# Patient Record
Sex: Male | Born: 1965 | Race: White | Hispanic: No | Marital: Married | Smoking: Current every day smoker
Health system: Southern US, Community
[De-identification: ages and names within clinical notes are randomized; demographics above are authoritative.]

## PROBLEM LIST (undated history)

## (undated) DIAGNOSIS — I639 Cerebral infarction, unspecified: Secondary | ICD-10-CM

## (undated) DIAGNOSIS — J439 Emphysema, unspecified: Secondary | ICD-10-CM

## (undated) DIAGNOSIS — E119 Type 2 diabetes mellitus without complications: Secondary | ICD-10-CM

## (undated) DIAGNOSIS — I10 Essential (primary) hypertension: Secondary | ICD-10-CM

## (undated) HISTORY — PX: KNEE SURGERY: SHX244

## (undated) HISTORY — DX: Type 2 diabetes mellitus without complications: E11.9

## (undated) HISTORY — DX: Essential (primary) hypertension: I10

## (undated) HISTORY — PX: FOOT SURGERY: SHX648

## (undated) HISTORY — DX: Cerebral infarction, unspecified: I63.9

## (undated) HISTORY — PX: SHOULDER SURGERY: SHX246

---

## 2002-11-19 ENCOUNTER — Other Ambulatory Visit: Payer: Self-pay

## 2003-03-06 ENCOUNTER — Other Ambulatory Visit: Payer: Self-pay

## 2003-08-31 ENCOUNTER — Other Ambulatory Visit: Payer: Self-pay

## 2004-06-29 ENCOUNTER — Other Ambulatory Visit: Payer: Self-pay

## 2004-06-29 ENCOUNTER — Emergency Department: Payer: Self-pay | Admitting: Internal Medicine

## 2004-06-29 IMAGING — CR DG CHEST 1V PORT
1 series · 1 of 1 positions shown · non-contrast
Comparison: none

REASON FOR EXAM: cp  sob  [HOSPITAL]
COMMENTS:

[view not recorded]
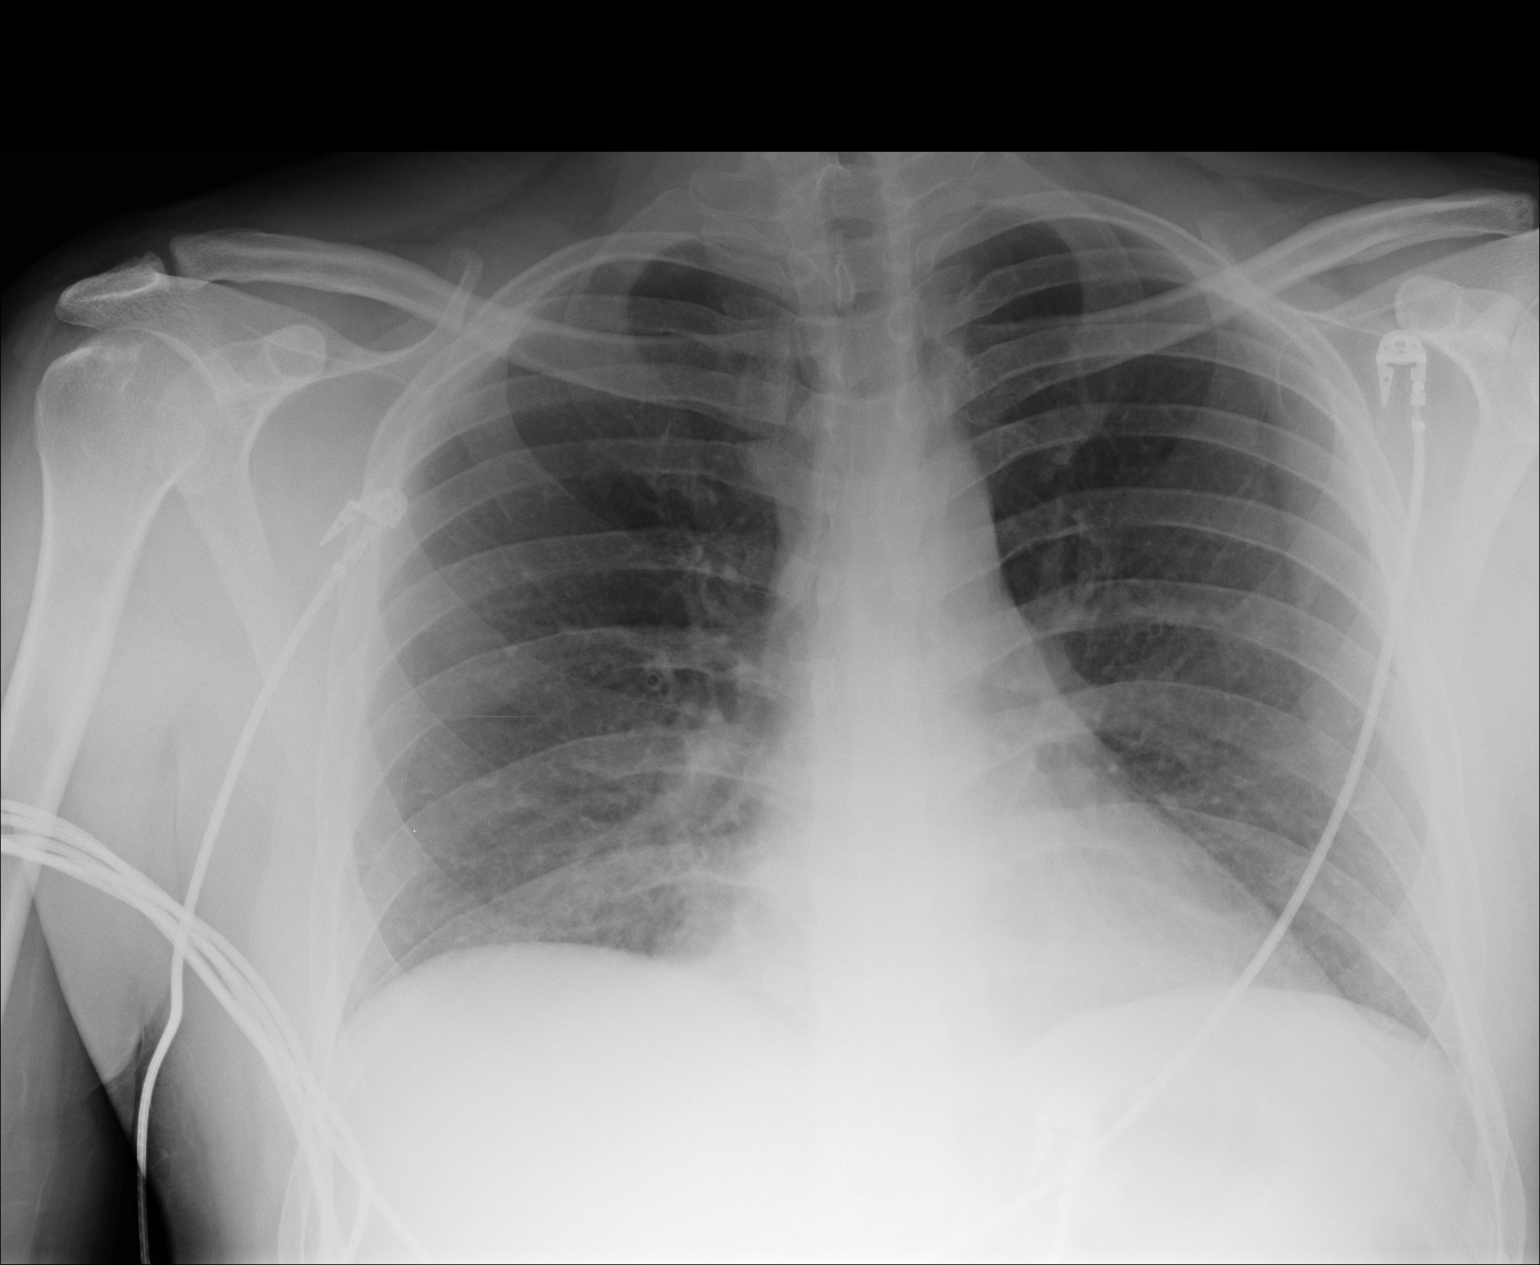

[1 of 1 positions shown; findings below may reference images not displayed]

PROCEDURE:     DXR - DXR PORTABLE CHEST SINGLE VIEW  - [DATE]  [DATE]

RESULT:       A single AP portable exam was obtained and compared to PA and
lateral of [DATE].  The heart is within normal limits in size.  Inspiratory
effort appears incomplete.  However, the lung fields appear clear with some
crowding of the bronchovascular structures in the lung bases.  Vascularity
is within normal limits with no effusions.
IMPRESSION: No acute findings identified.

## 2004-09-01 ENCOUNTER — Emergency Department: Payer: Self-pay | Admitting: Emergency Medicine

## 2005-04-08 ENCOUNTER — Emergency Department: Payer: Self-pay | Admitting: Emergency Medicine

## 2005-04-08 IMAGING — CR DG KNEE COMPLETE 4+V*R*
1 series · 5 of 5 positions shown · non-contrast
Comparison: none

REASON FOR EXAM: Pain
COMMENTS:

[Series 1: view not recorded · 0.17mm/px · 5 of 5 slices shown]
[im 1/5]
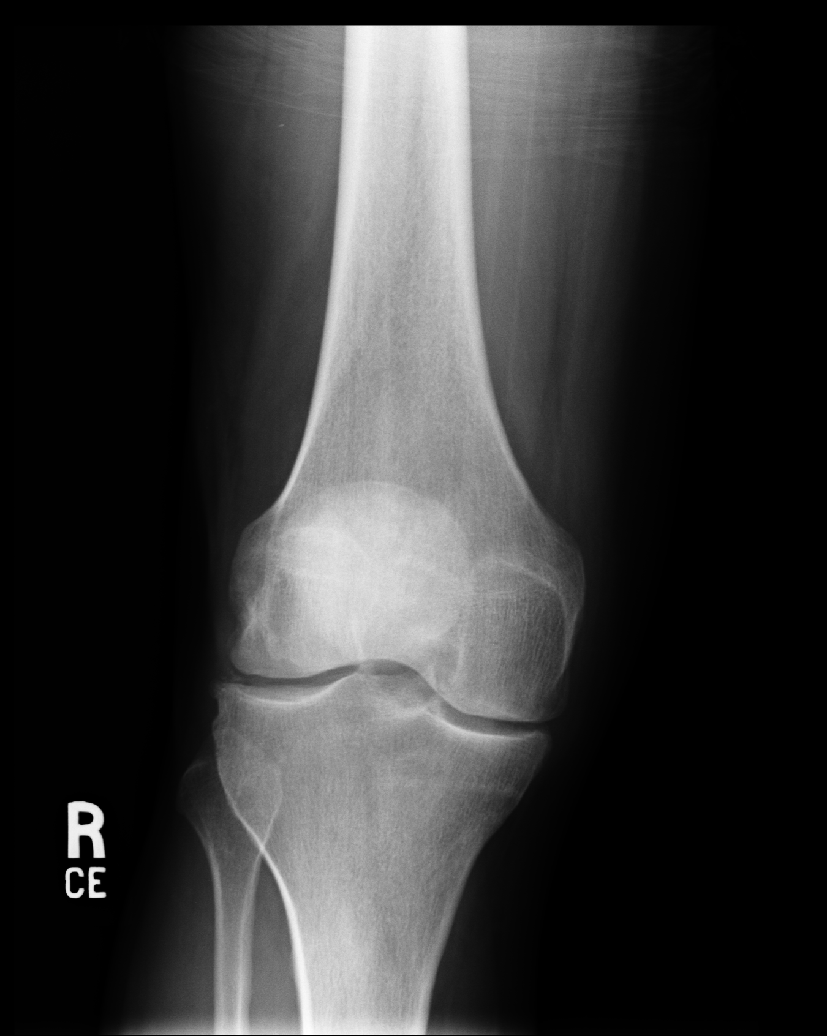
[im 2/5]
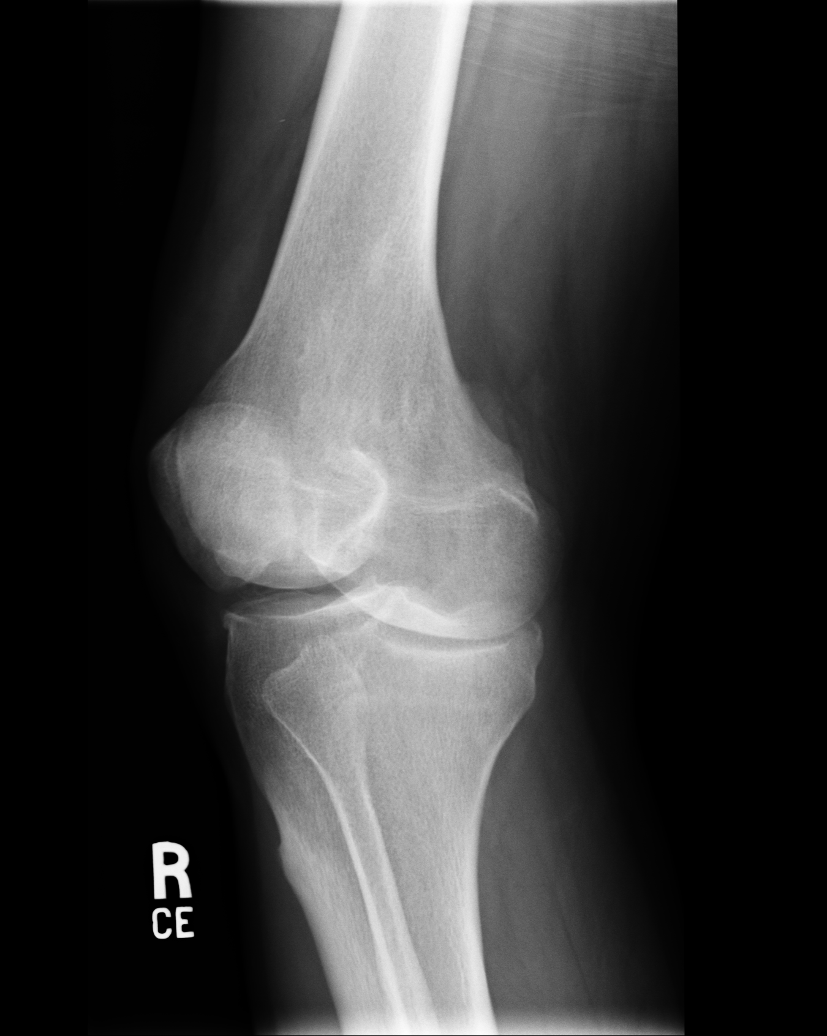
[im 3/5]
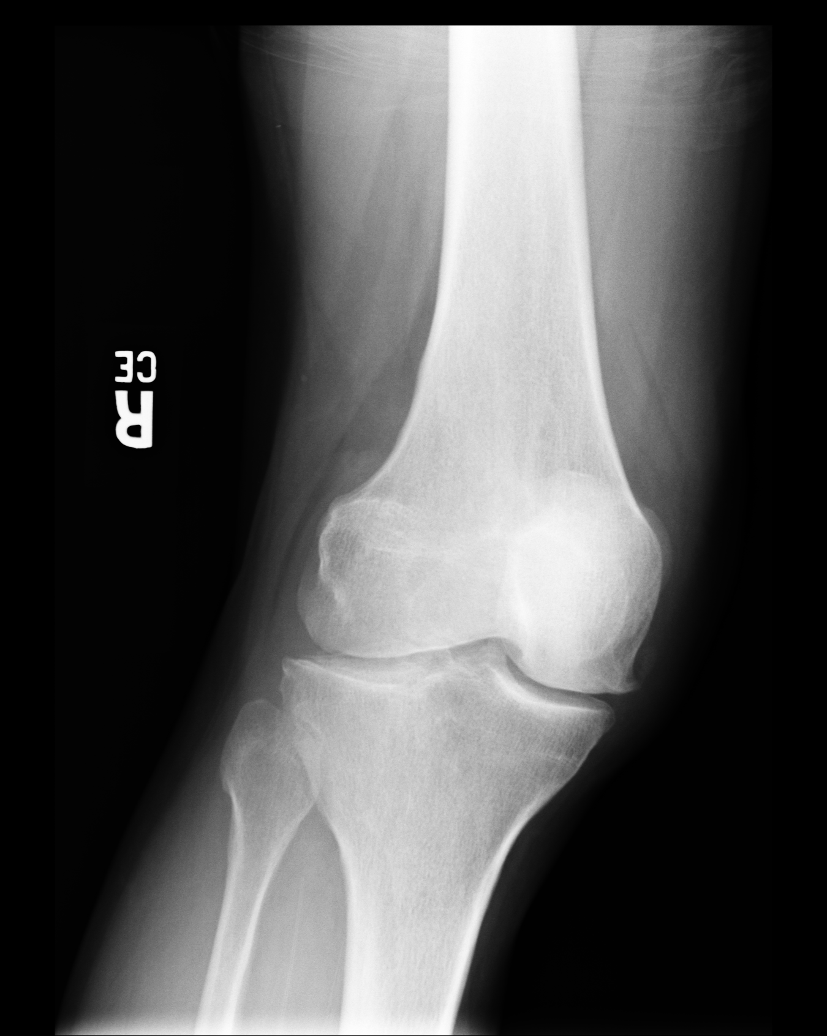
[im 4/5]
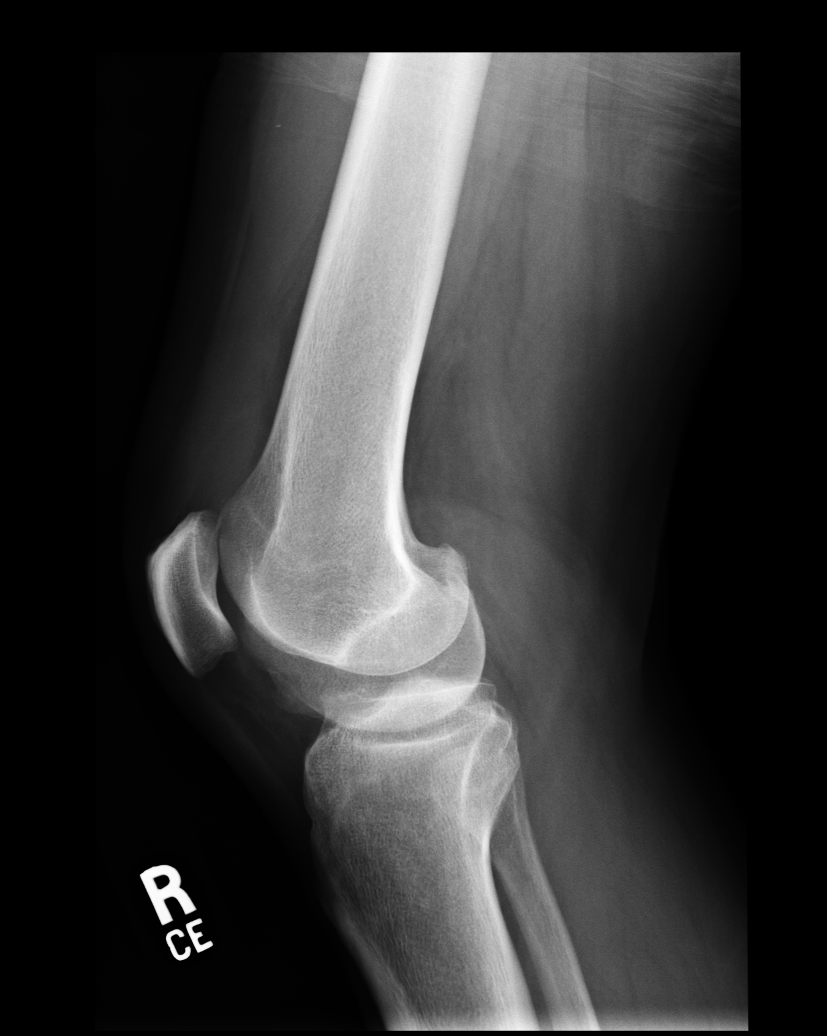
[im 5/5]
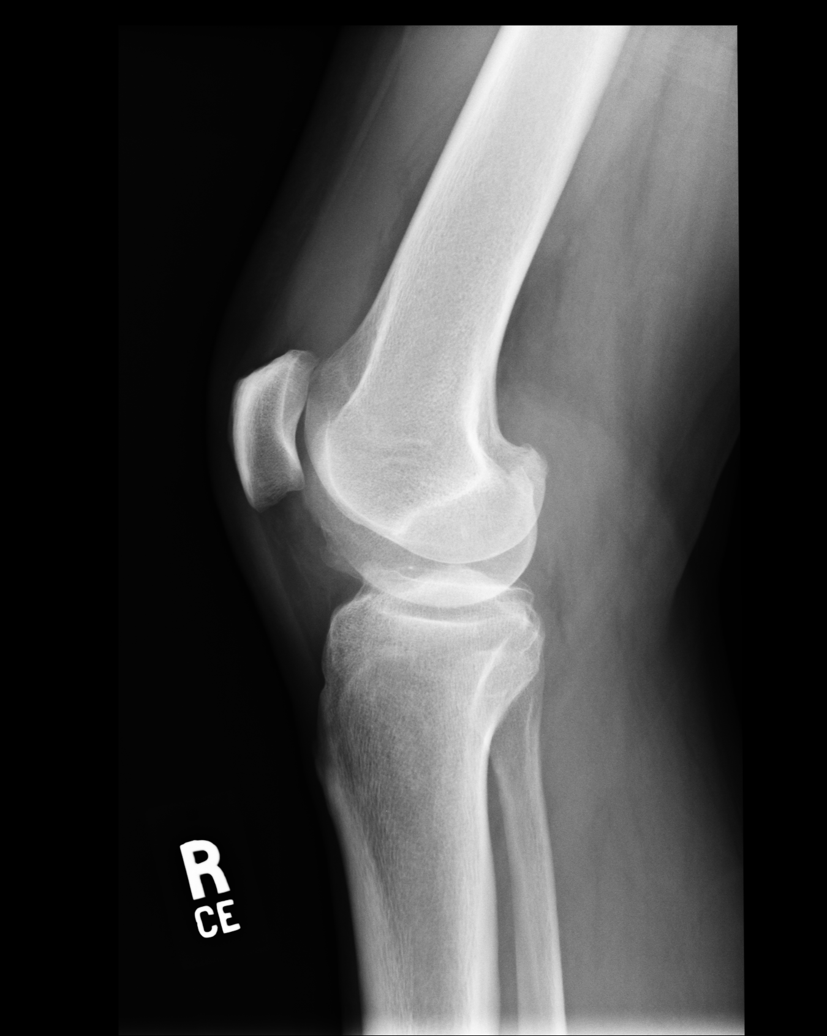

[5 of 5 positions shown; findings below may reference images not displayed]

PROCEDURE:     DXR - DXR KNEE RT COMP WITH OBLIQUES  - [DATE]  [DATE]

RESULT:     The bones of the knee appear adequately mineralized.  I see no
evidence of fracture or dislocation or joint effusion.  No more than mild
degenerative-type change is present and this is manifested by an osteophyte
from the medial femoral condyle.
IMPRESSION: I do not see evidence of an acute fracture or severe degenerative joint
change.  Further evaluation with MRI may be of value if there are clinical
findings worrisome for internal derangement.

## 2005-08-24 ENCOUNTER — Emergency Department: Payer: Self-pay | Admitting: Emergency Medicine

## 2005-09-14 ENCOUNTER — Emergency Department: Payer: Self-pay | Admitting: Emergency Medicine

## 2005-10-06 ENCOUNTER — Emergency Department: Payer: Self-pay | Admitting: Emergency Medicine

## 2005-10-06 ENCOUNTER — Other Ambulatory Visit: Payer: Self-pay

## 2005-10-06 IMAGING — CT CT HEAD WITHOUT CONTRAST
2 series · 16 of 30 positions shown, 20 images · non-contrast
Comparison: none

REASON FOR EXAM: Dizzy
COMMENTS:

[Series 2: without · axial · non-contrast · 0.38mm/px · z∈[+436,+556]mm · 13 of 29 slices shown, 17 images]
[im 3/29  brain]
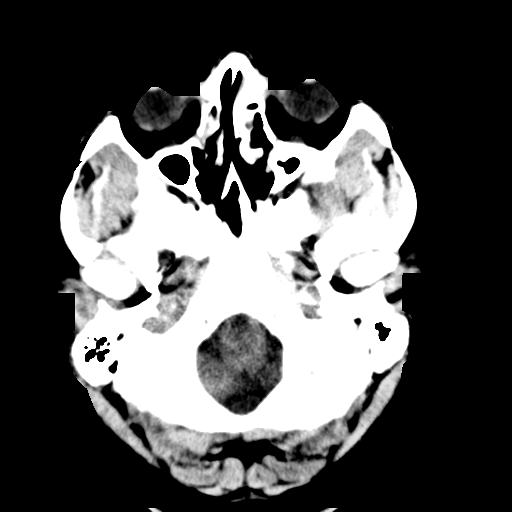
[im 3/29  bone]
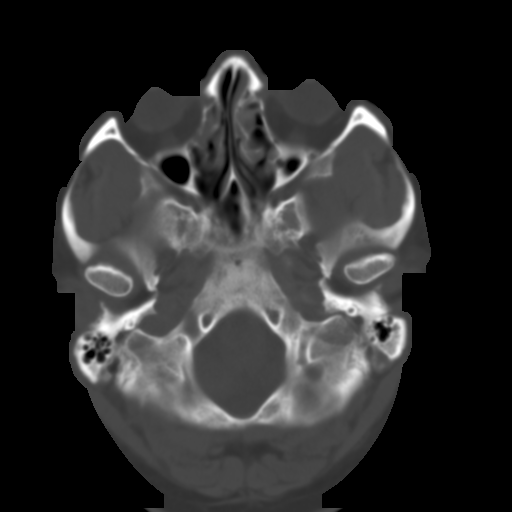
[im 5/29  brain]
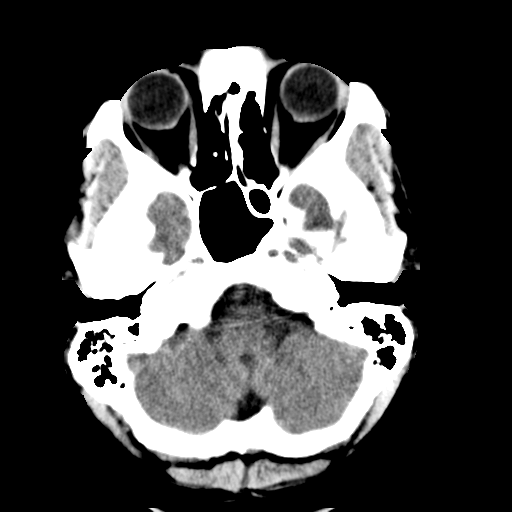
[im 7/29  brain]
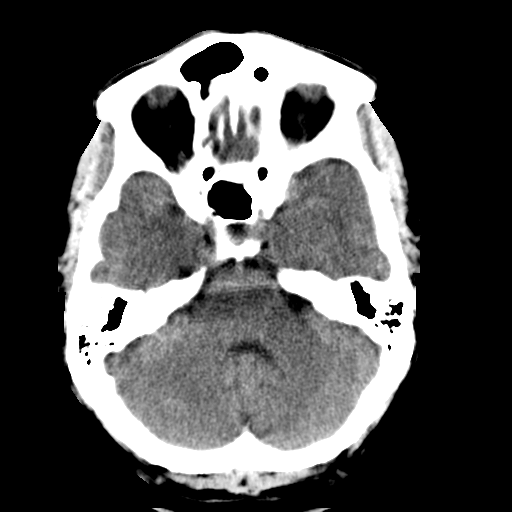
[im 9/29  brain]
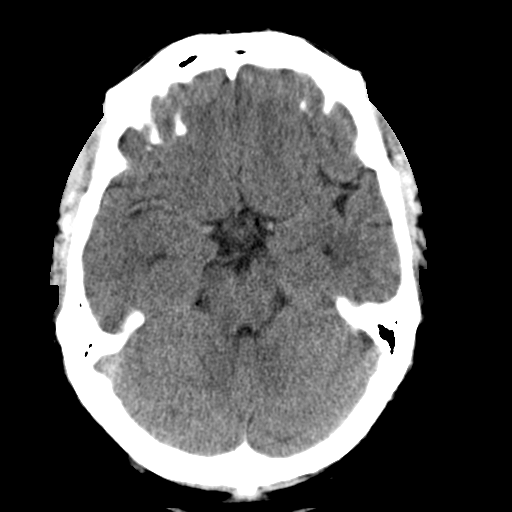
[im 11/29  brain]
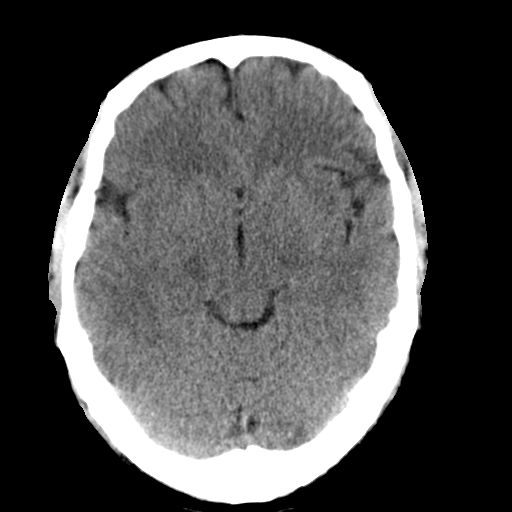
[im 11/29  bone]
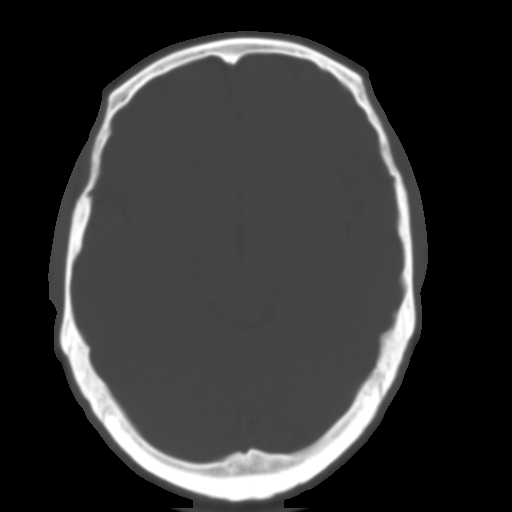
[im 13/29  brain]
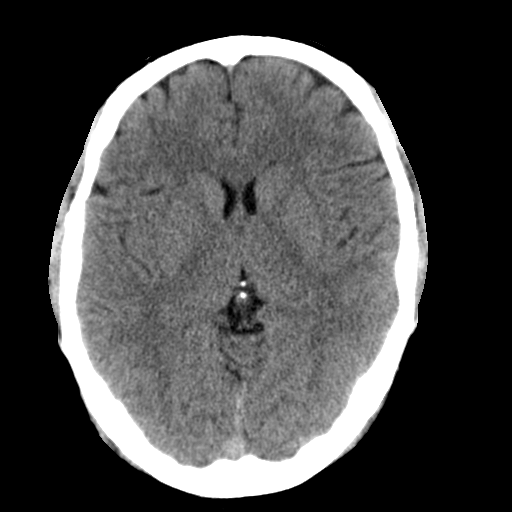
[im 15/29  brain]
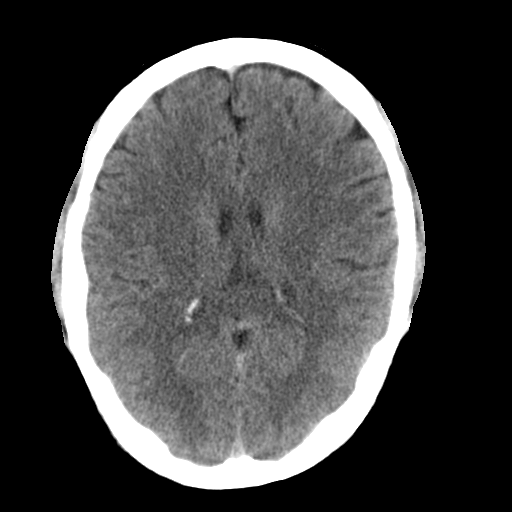
[im 17/29  brain]
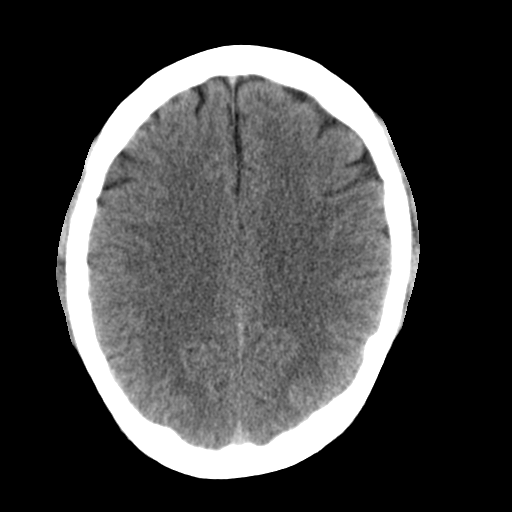
[im 19/29  brain]
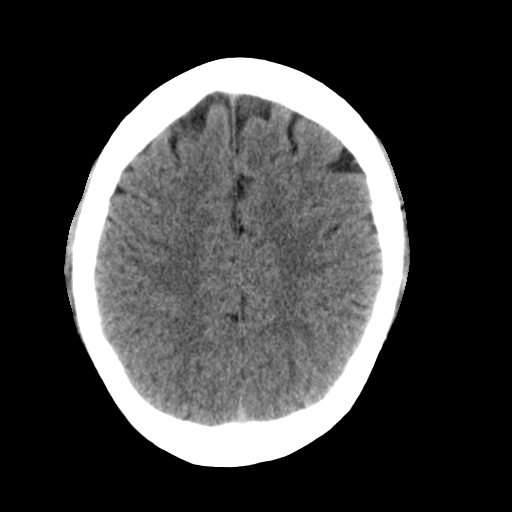
[im 19/29  bone]
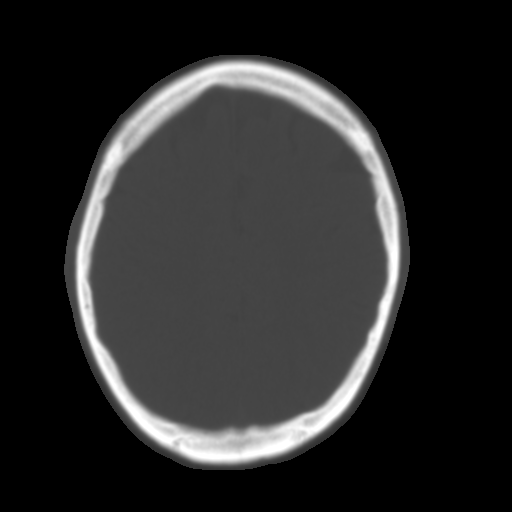
[im 21/29  brain]
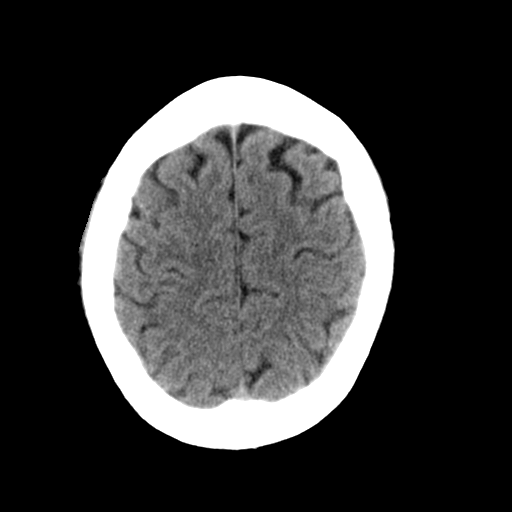
[im 23/29  brain]
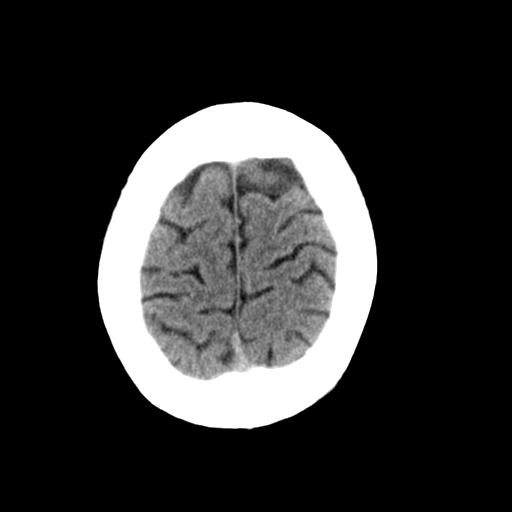
[im 25/29  brain]
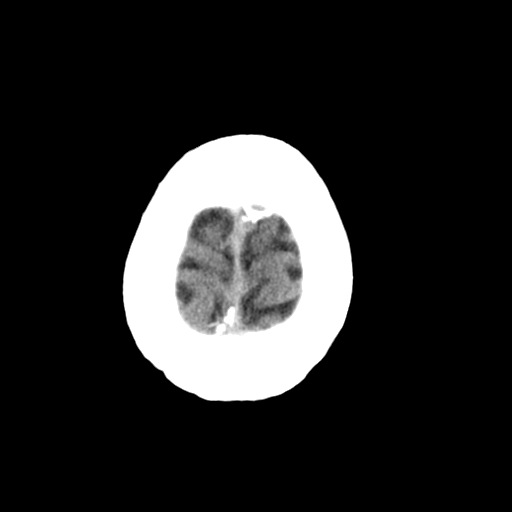
[im 27/29  brain]
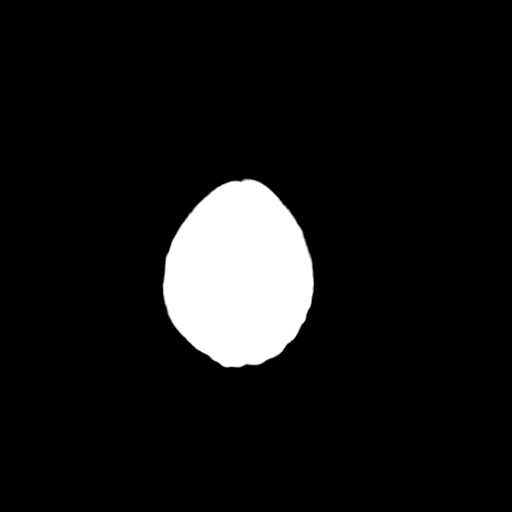
[im 27/29  bone]
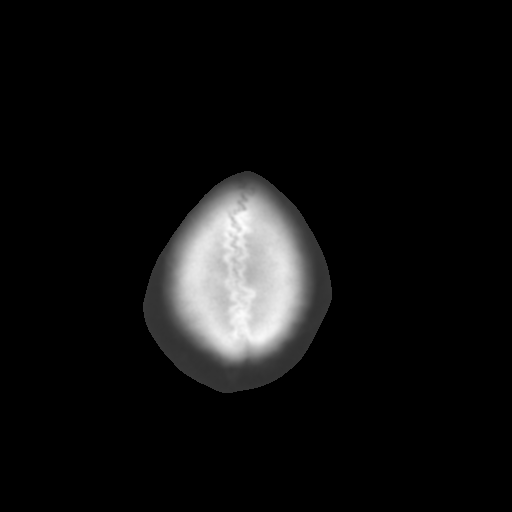

[Series 3: bone · axial · 0.38mm/px · z∈[+436,+476]mm · 3 of 29 slices shown]
[im 3/29  bone]
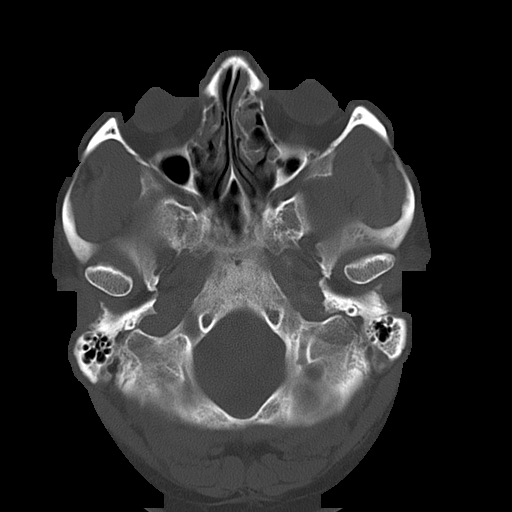
[im 7/29  bone]
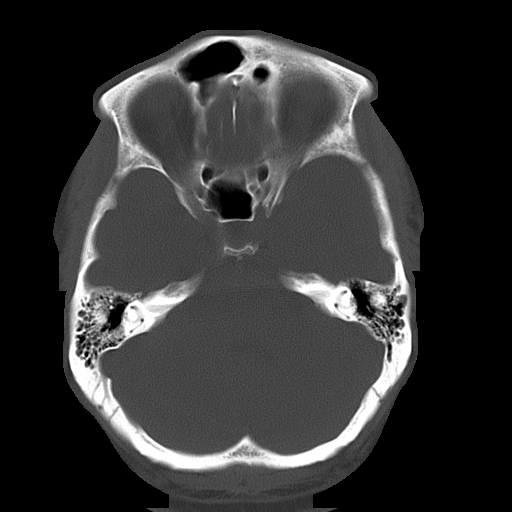
[im 11/29  bone]
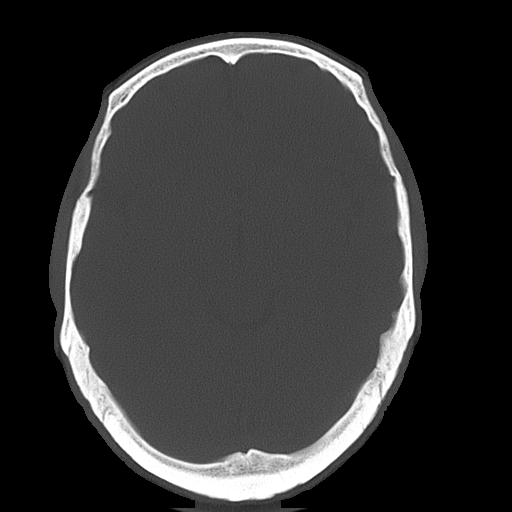

[16 of 30 positions shown; findings below may reference images not displayed]

PROCEDURE:     CT  - CT HEAD WITHOUT CONTRAST  - [DATE]  [DATE]

RESULT:       Unenhanced emergent Head CT was obtained for dizziness.

Report was faxed to the Emergency Room.  No intracerebral bleeds.  No
infarcts.  No mass effect.  No shift of the midline.  The ventricles appear
within normal limits.  No extraaxial fluid collections.  On the bone window
settings, no osseous abnormalities are seen.  Possible ethmoid sinusitis.
The remaining sinuses visualized and mastoids appear clear.
IMPRESSION: No acute findings identified.  Possible ethmoid sinusitis.

## 2005-10-06 IMAGING — CR DG CHEST 1V PORT
1 series · 1 of 1 positions shown · non-contrast
Comparison: none

REASON FOR EXAM: chest pain
COMMENTS:

PROCEDURE:     DXR - DXR PORTABLE CHEST SINGLE VIEW  - [DATE]  [DATE]
RESULT:          AP view of the chest is compared to the prior exam of
[DATE].
The lung fields remain clear. The heart, mediastinal and osseous structures
show no significant abnormalities.  Monitoring electrodes are present.

[view not recorded]
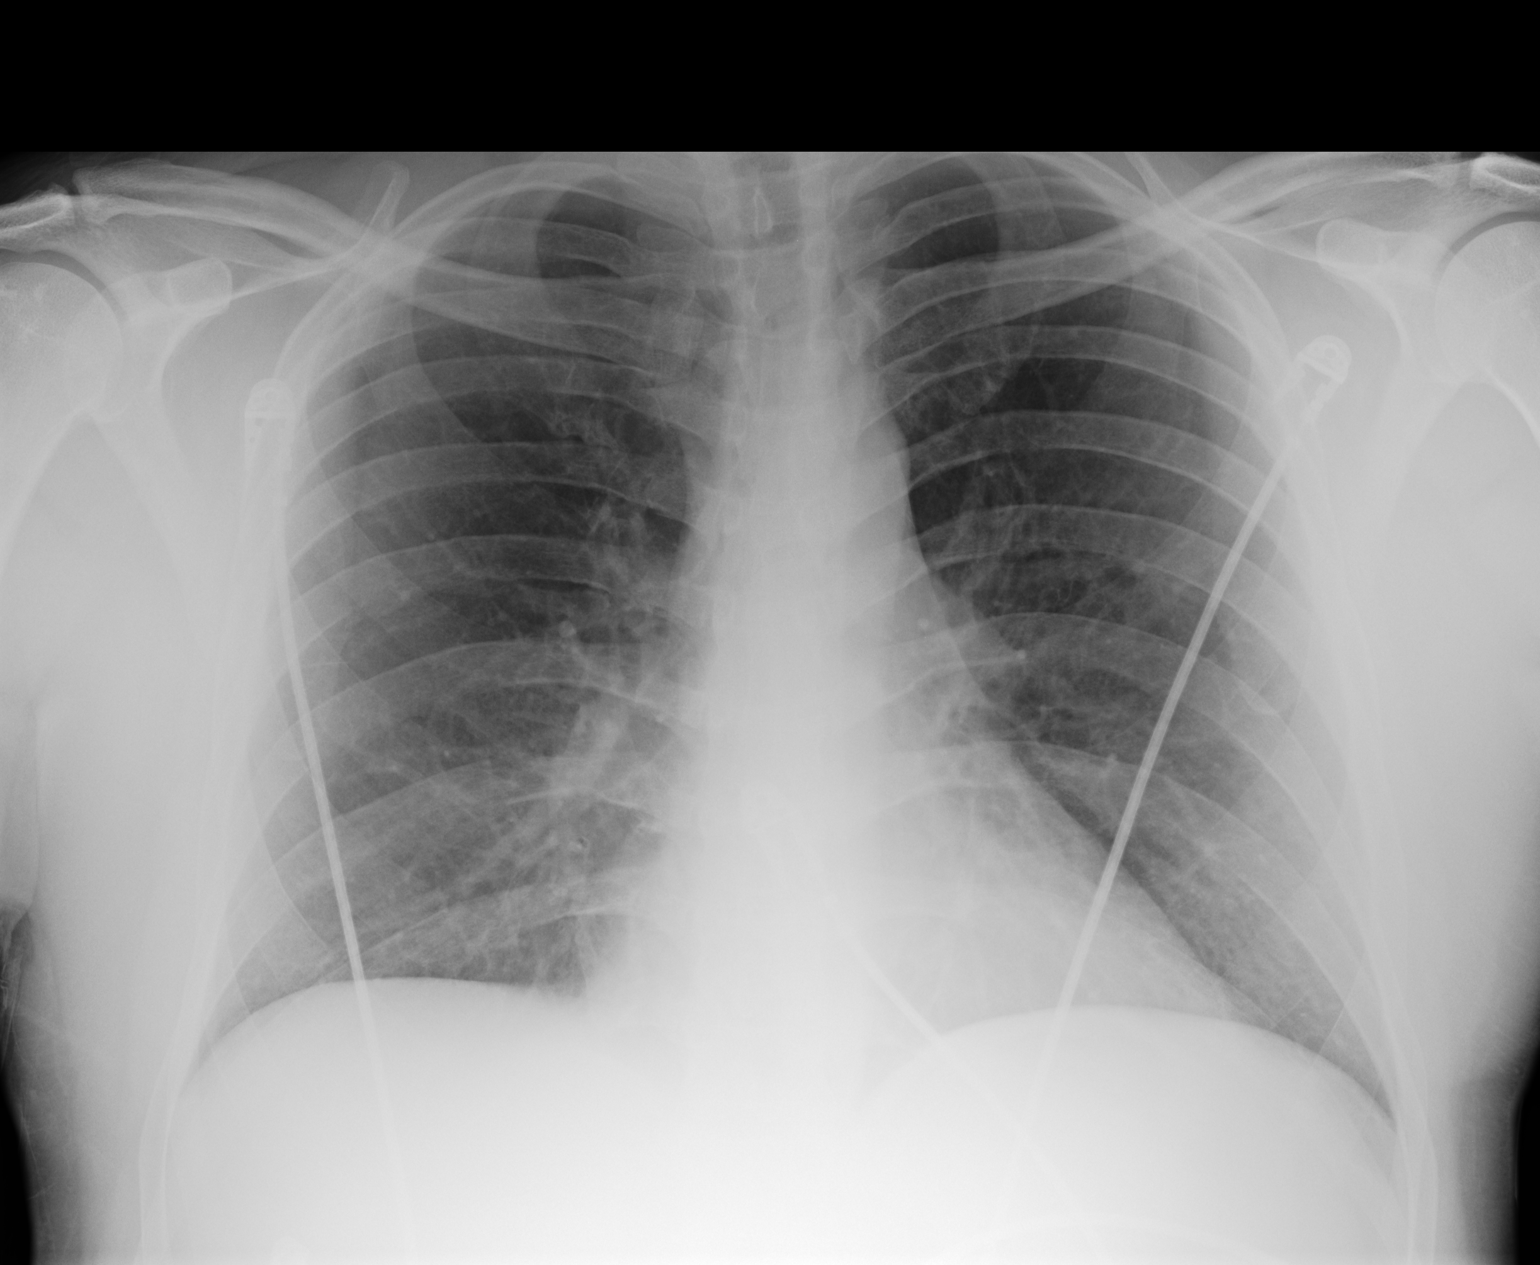

[1 of 1 positions shown; findings below may reference images not displayed]

IMPRESSION: No acute changes are identified.

## 2005-10-30 ENCOUNTER — Emergency Department: Payer: Self-pay | Admitting: Emergency Medicine

## 2005-10-31 IMAGING — CT CT STONE STUDY
1 of 2 series · 15 of 32 positions shown, 19 images · non-contrast
Comparison: none

REASON FOR EXAM: Abdominal pain, stone protocol
COMMENTS:

[Series 2: stone · axial · 0.76mm/px · z∈[-806,-380]mm · 15 of 159 slices shown, 19 images]
[im 11/159  soft-tissue]
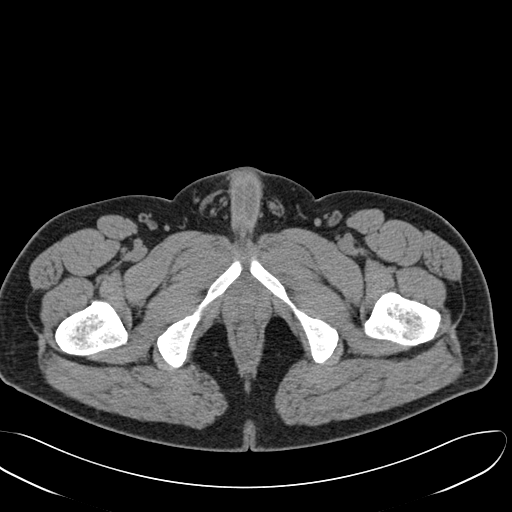
[im 11/159  bone]
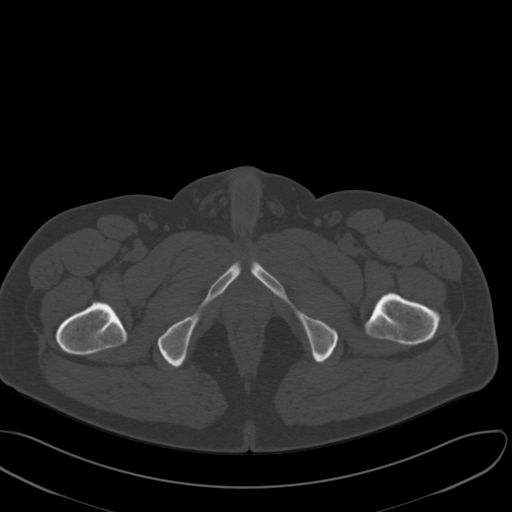
[im 22/159  soft-tissue]
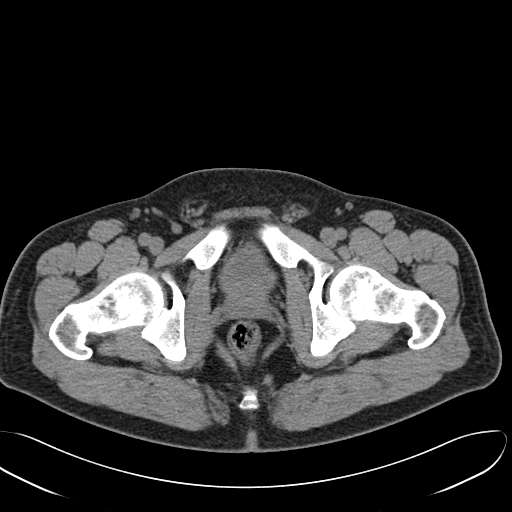
[im 33/159  soft-tissue]
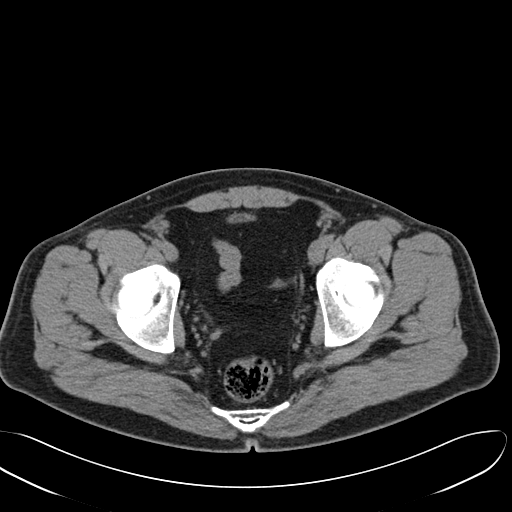
[im 44/159  soft-tissue]
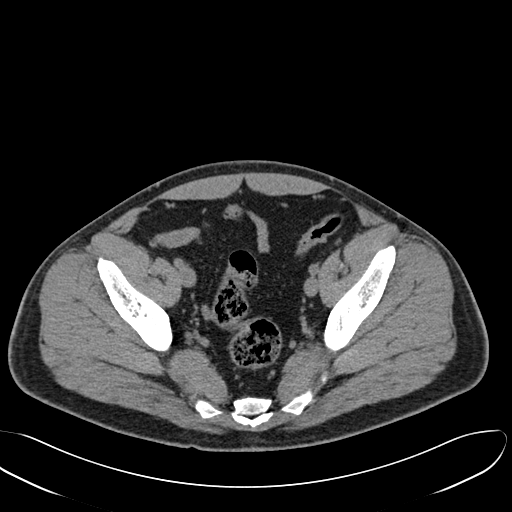
[im 55/159  soft-tissue]
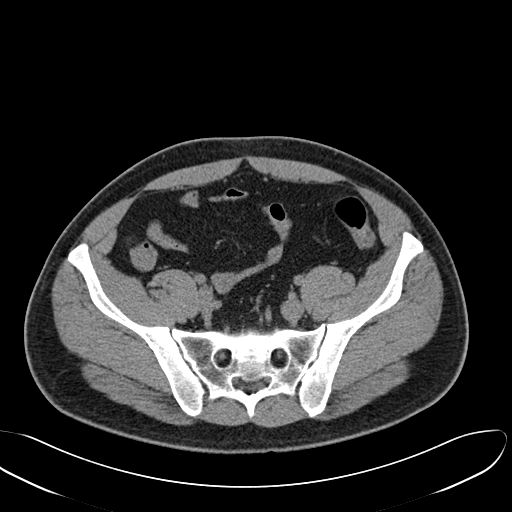
[im 66/159  soft-tissue]
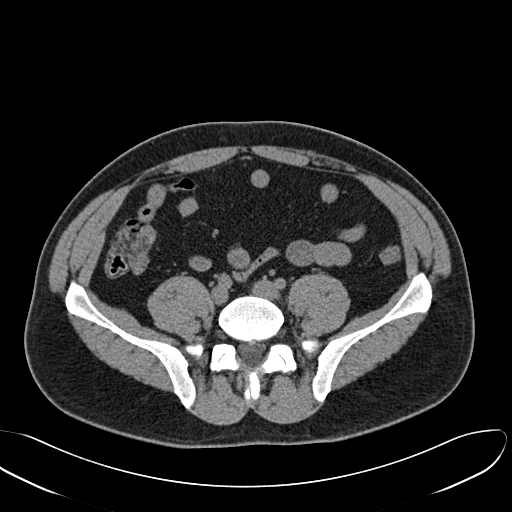
[im 82/159  soft-tissue]
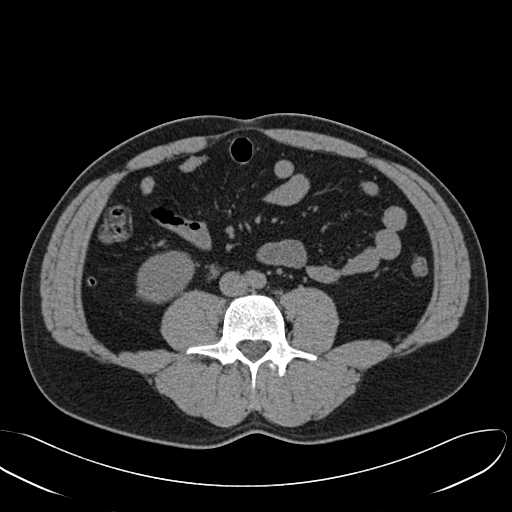
[im 93/159  soft-tissue]
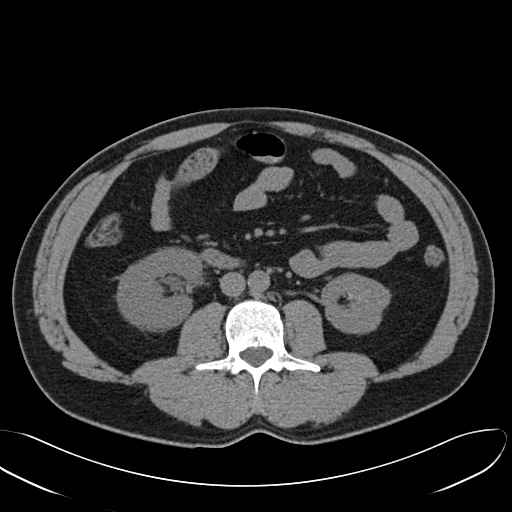
[im 104/159  soft-tissue]
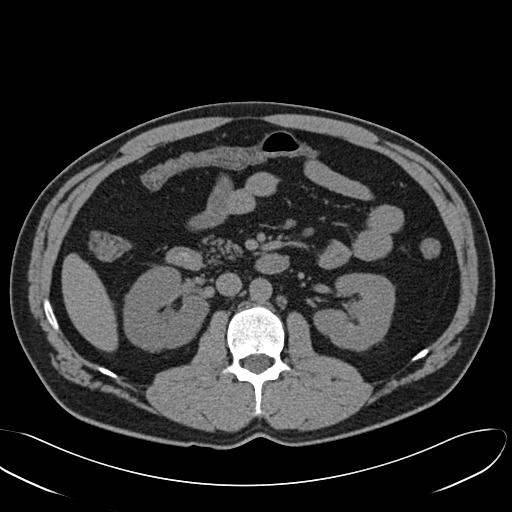
[im 104/159  bone]
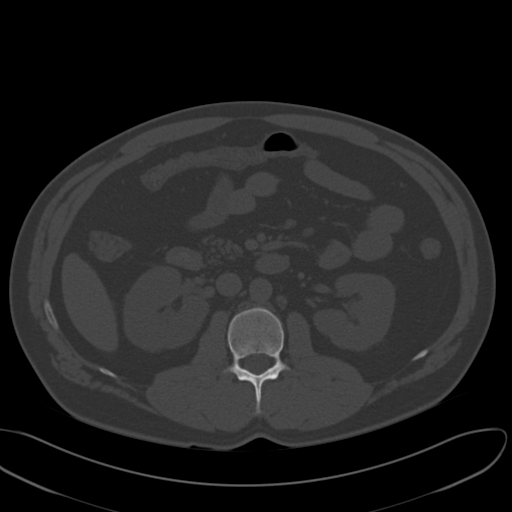
[im 115/159  soft-tissue]
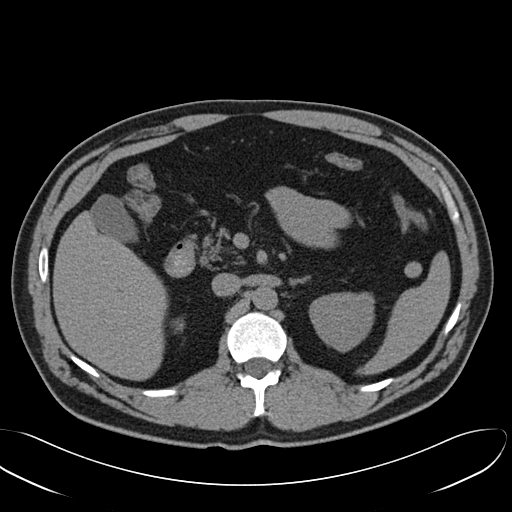
[im 126/159  soft-tissue]
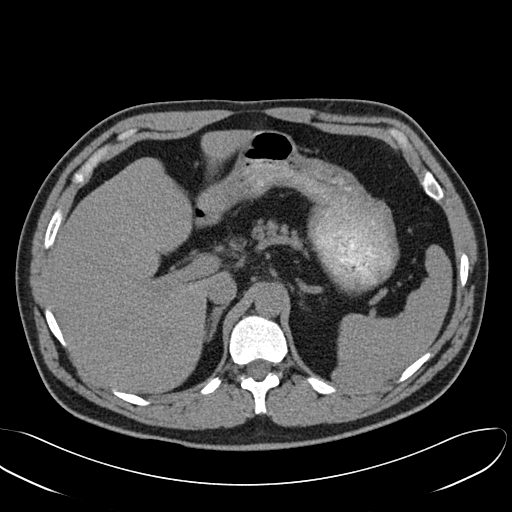
[im 137/159  soft-tissue]
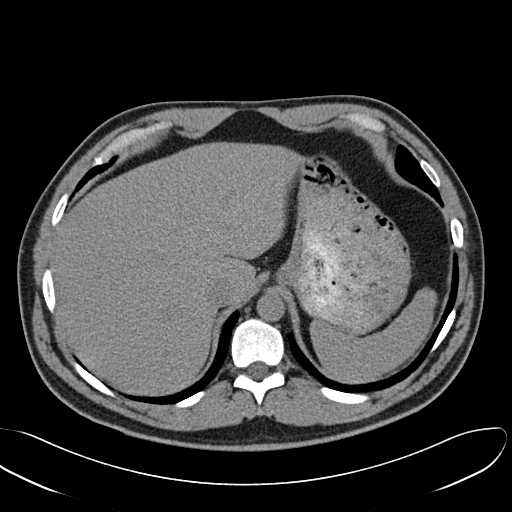
[im 137/159  lung]
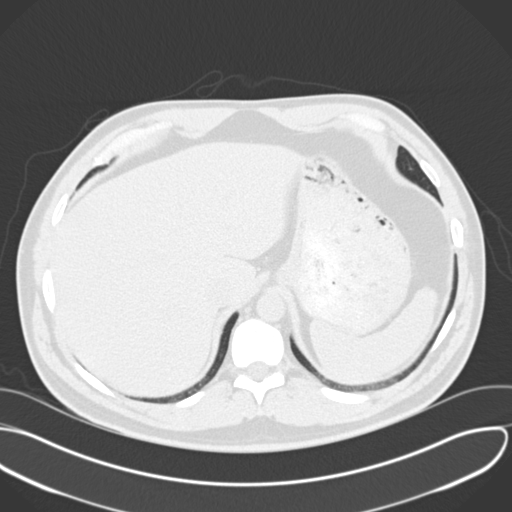
[im 142/159  lung]
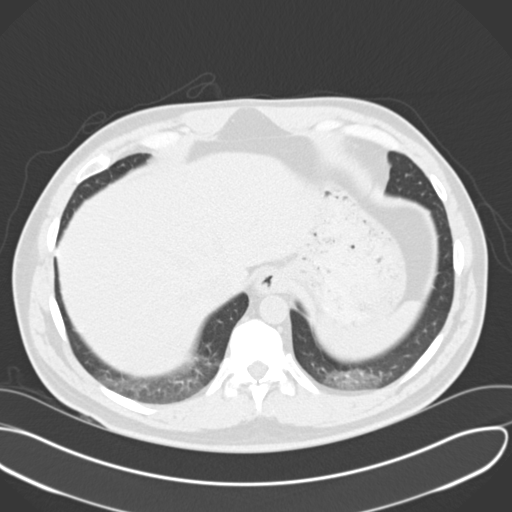
[im 148/159  soft-tissue]
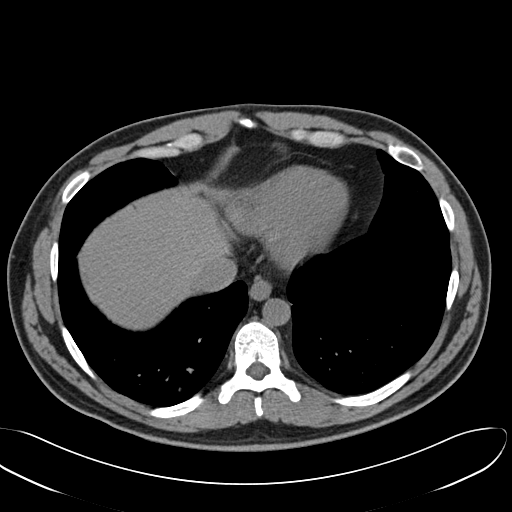
[im 148/159  lung]
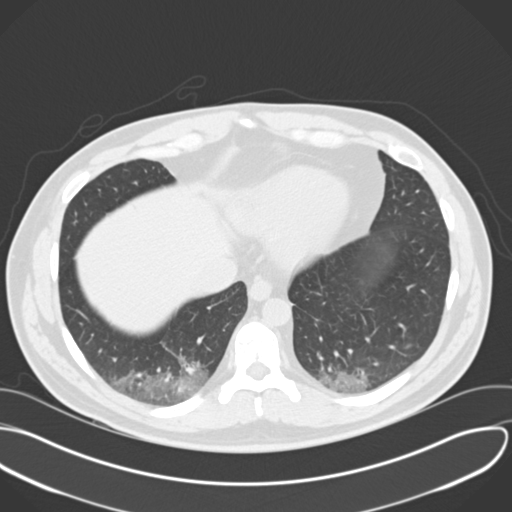
[im 153/159  lung]
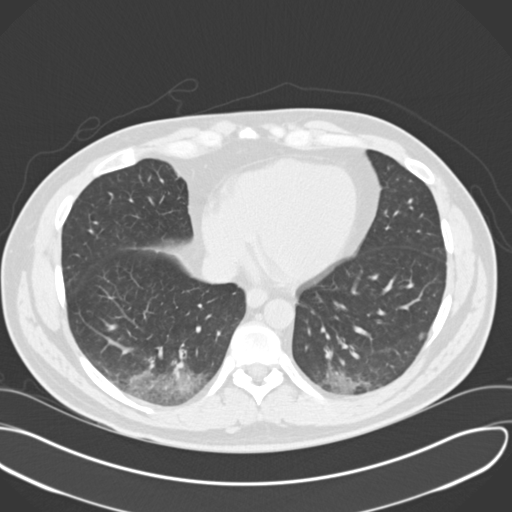

[15 of 32 positions shown; findings below may reference images not displayed]

PROCEDURE:     CT  - CT ABDOMEN /PELVIS WO (STONE)  - [DATE] [DATE]

RESULT:     The patient is complaining of RIGHT flank discomfort and the
study was tailored to evaluate the patient for urinary tract stones.

The RIGHT kidney exhibits mild hydronephrosis. Mild dilation of the proximal
RIGHT ureter down to image #83 is seen. Here there is a stone measuring
approximately 2.0 mm in diameter in the proximal ureter. A small amount of
periureteral edema is seen. I do not see other stones in the RIGHT kidney.
The LEFT kidney is normal in density and contour. The perinephric fat is
normal in appearance.

The appendix is demonstrated. The unopacified loops of small and large bowel
exhibit no acute abnormality. There is no free fluid in the abdomen or
pelvis. The urinary bladder, prostate gland and seminal vesicles are normal
in appearance. The caliber of the abdominal aorta is normal. The liver,
spleen, gallbladder, stomach, adrenal glands and pancreas exhibit no acute
abnormality. The lung bases reveal patchy density in the posterior
costophrenic gutters within the lungs consistent with subsegmental
atelectasis.
IMPRESSION: 1.     There is mild hydronephrosis and mild proximal hydroureter secondary
to a 2.0 mm in diameter stone in the RIGHT proximal ureter. I see no other
stones on the RIGHT or LEFT.
2.     There is no evidence of acute appendicitis or evidence of gallstones.

A preliminary report was sent to the [HOSPITAL] the conclusion
of the study.

## 2006-02-05 ENCOUNTER — Other Ambulatory Visit: Payer: Self-pay

## 2006-02-05 ENCOUNTER — Emergency Department: Payer: Self-pay | Admitting: Emergency Medicine

## 2006-02-05 IMAGING — CR DG CHEST 1V PORT
1 series · 1 of 1 positions shown · non-contrast
Comparison: none

REASON FOR EXAM: PAIN
COMMENTS:

[view not recorded]
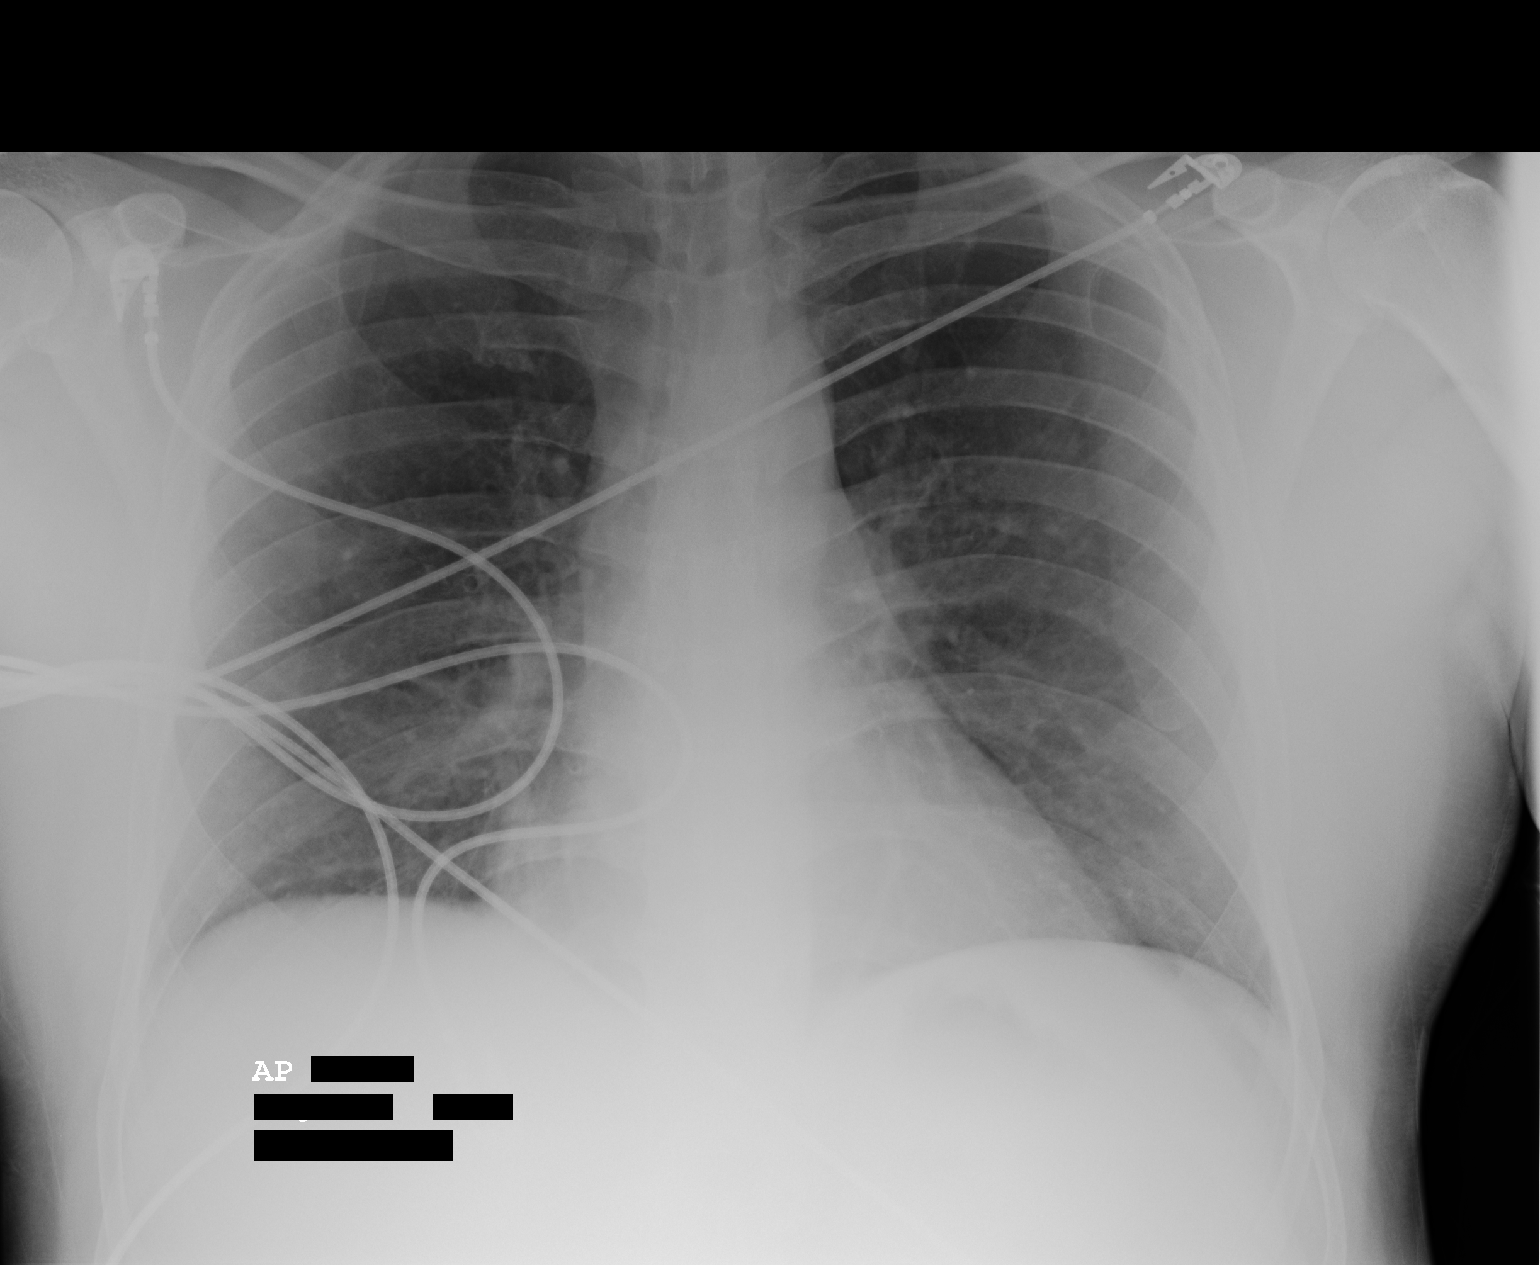

[1 of 1 positions shown; findings below may reference images not displayed]

PROCEDURE:     DXR - DXR PORTABLE CHEST SINGLE VIEW  - [DATE]  [DATE]

RESULT:     Comparison is made to the study of [DATE]. The lungs are
adequately inflated. The lung markings are somewhat coarse in the right
infrahilar region. I see no focal mass. There is no pleural effusion. The
mediastinum is not widened. The cardiac silhouette is not enlarged.
Pulmonary vascularity is not engorged.
IMPRESSION: There are findings which likely reflect a subsegmental
atelectasis on the right infrahilar region. Followup films would be of value.

## 2006-02-05 IMAGING — CT CT CHEST W/ CM
2 series · 16 of 32 positions shown, 20 images · non-contrast
Comparison: none

REASON FOR EXAM: pe protcol "rm 5
COMMENTS:

[Series 4: soft tissue · axial · 0.81mm/px · z∈[-398,-344]mm · 2 of 116 slices shown]
[im 9/116  mediastinal]
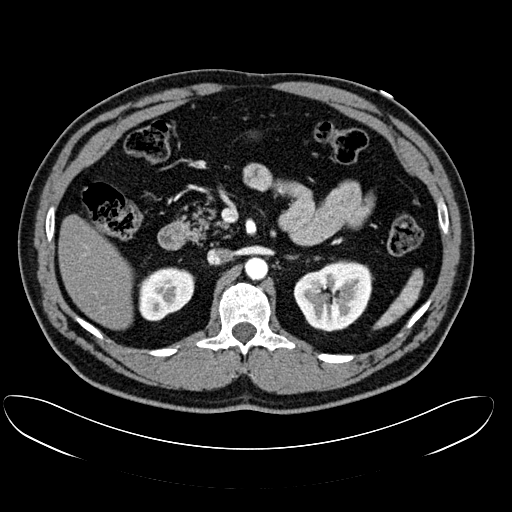
[im 27/116  mediastinal]
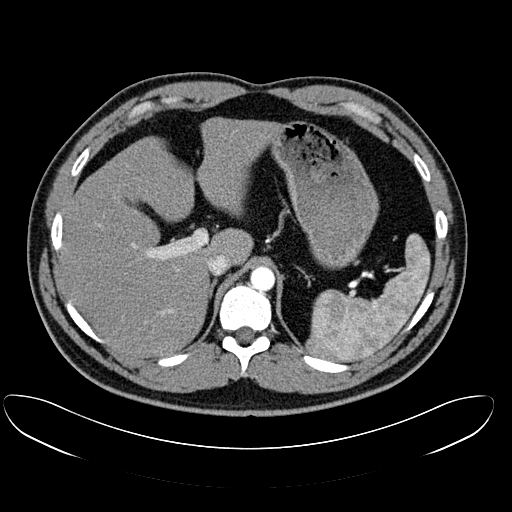

[Series 5: lung windows · axial · 0.81mm/px · z∈[-388,-104]mm · 14 of 113 slices shown, 18 images]
[im 9/113  mediastinal]
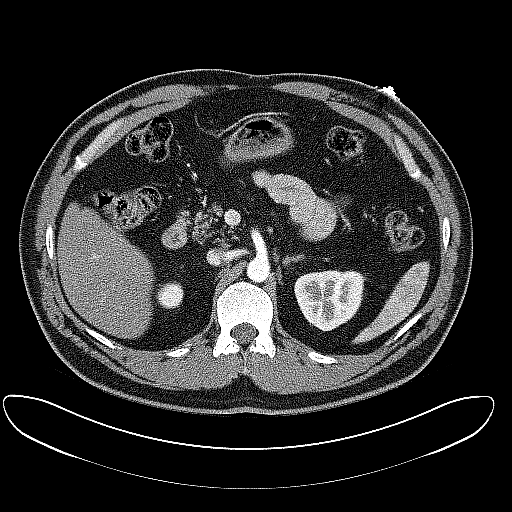
[im 9/113  lung]
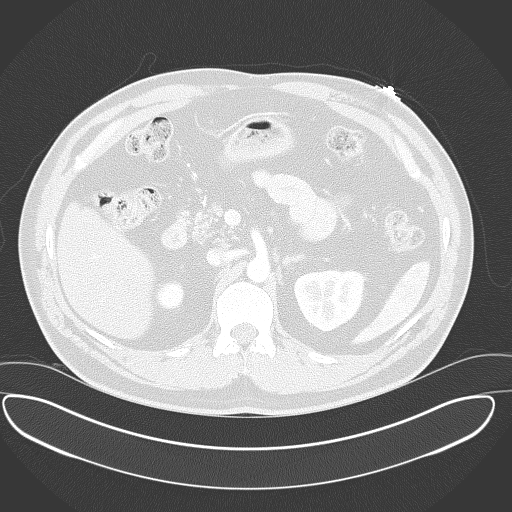
[im 18/113  lung]
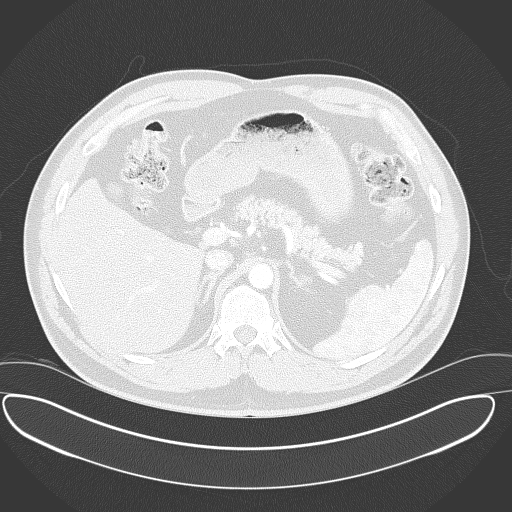
[im 26/113  lung]
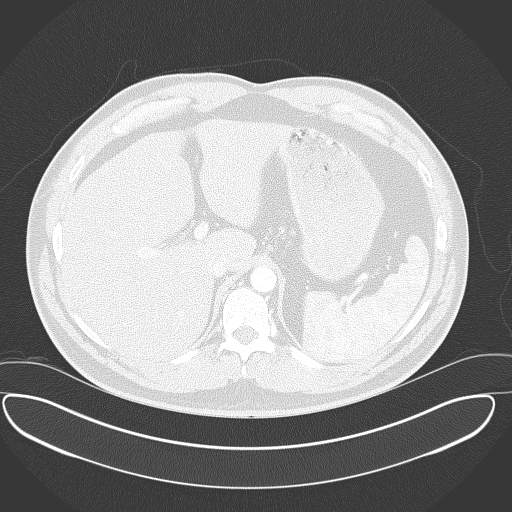
[im 35/113  lung]
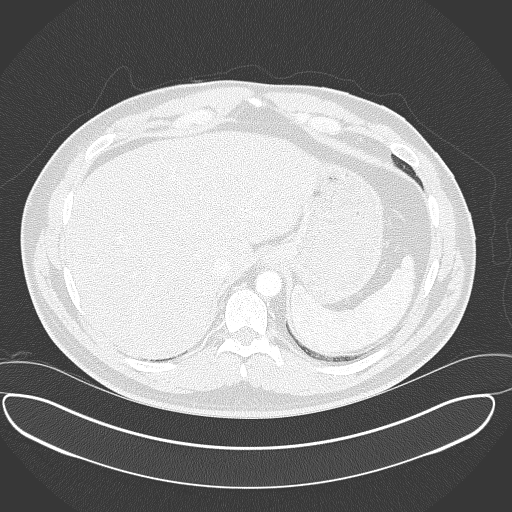
[im 44/113  mediastinal]
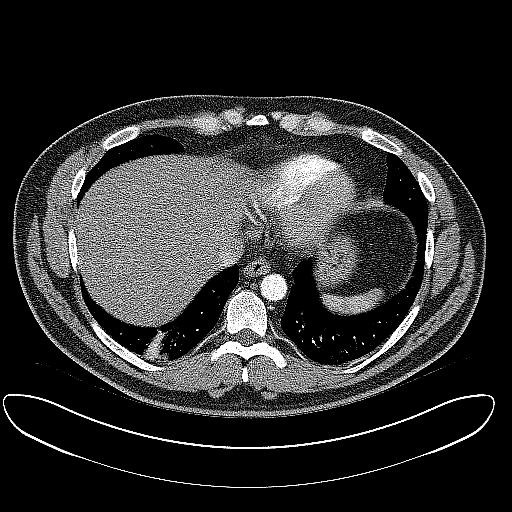
[im 44/113  lung]
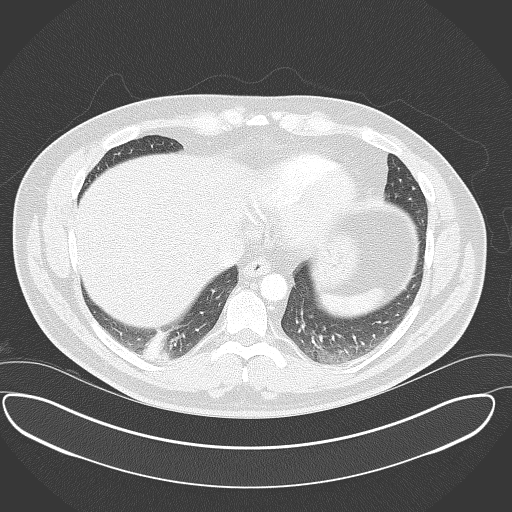
[im 52/113  lung]
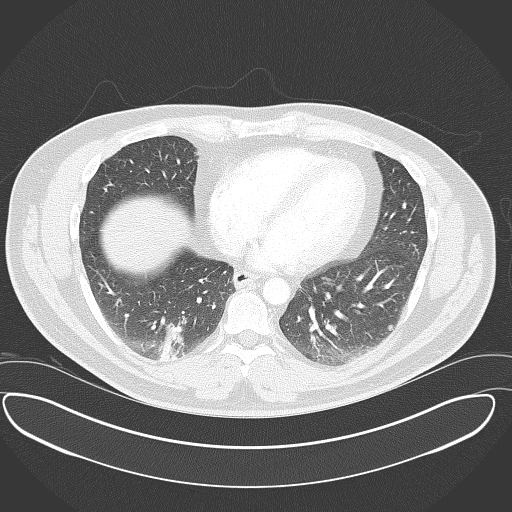
[im 53/113  lung]
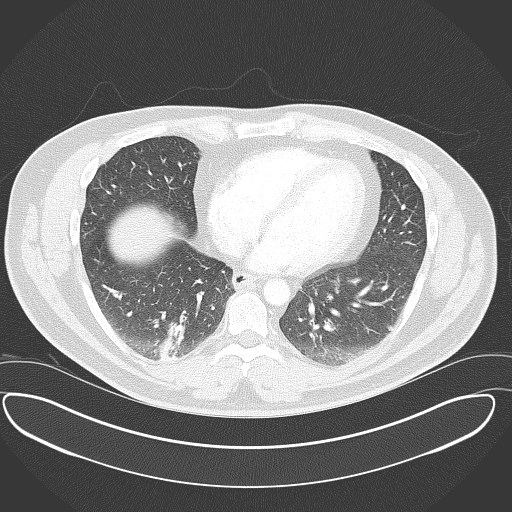
[im 57/113  lung]
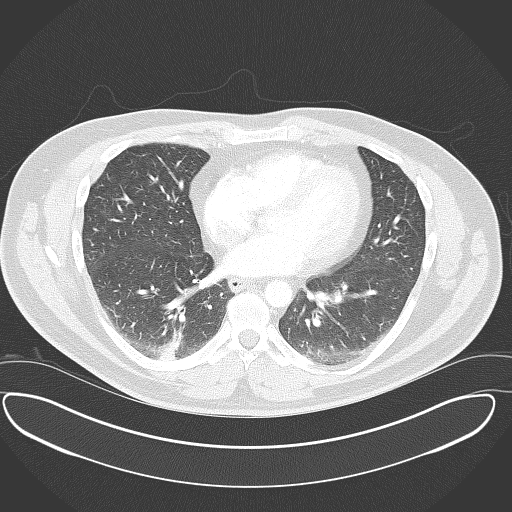
[im 61/113  mediastinal]
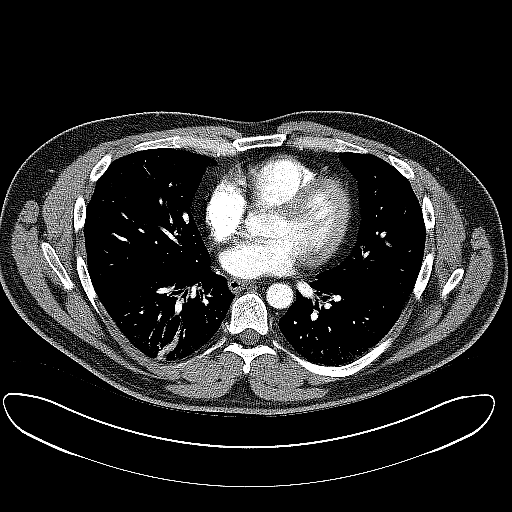
[im 61/113  lung]
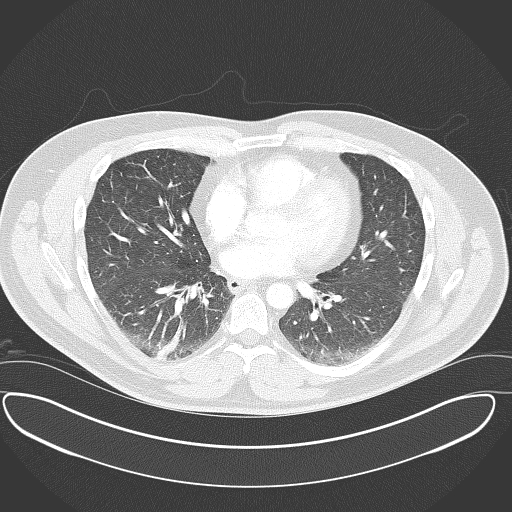
[im 69/113  lung]
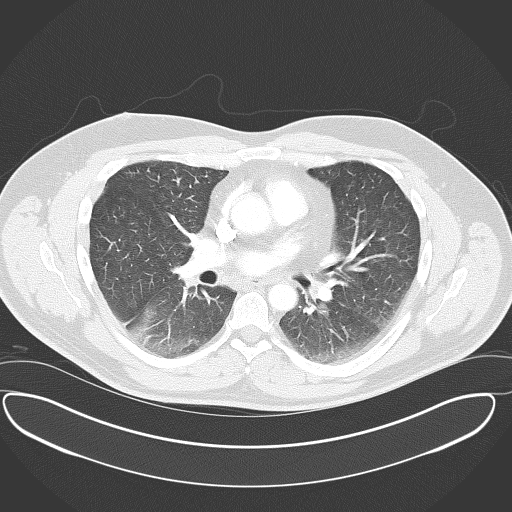
[im 78/113  lung]
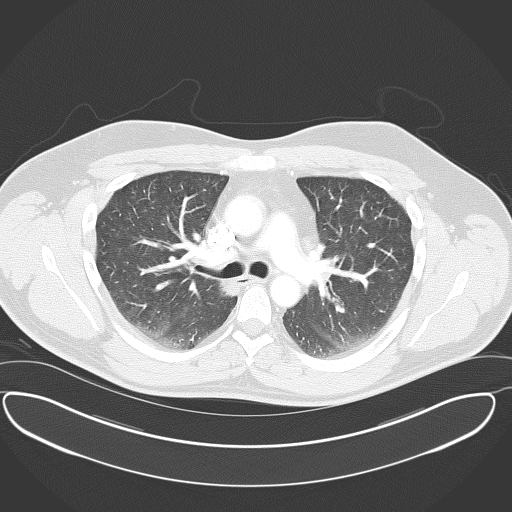
[im 87/113  lung]
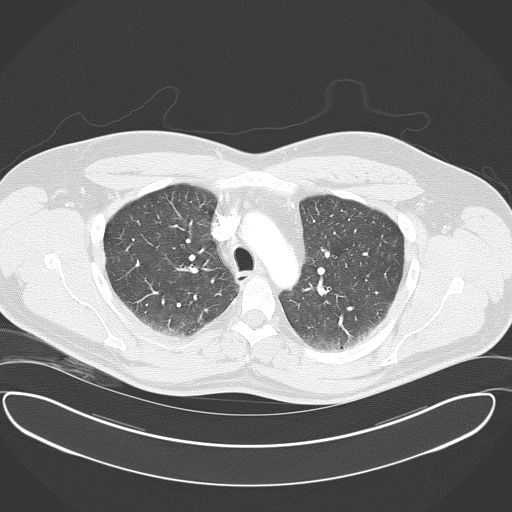
[im 95/113  mediastinal]
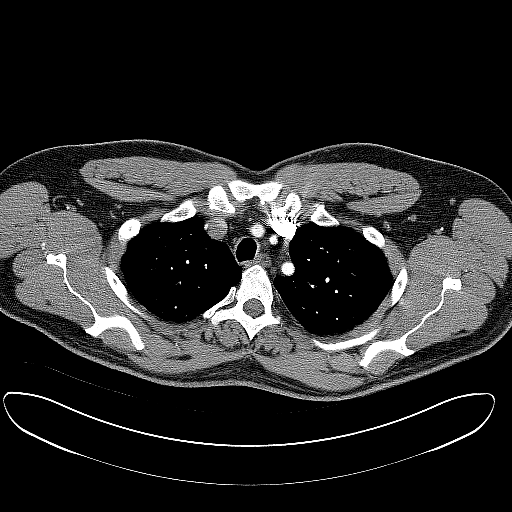
[im 95/113  lung]
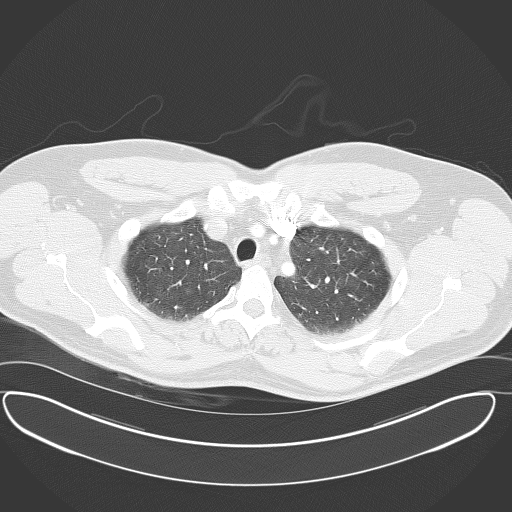
[im 104/113  lung]
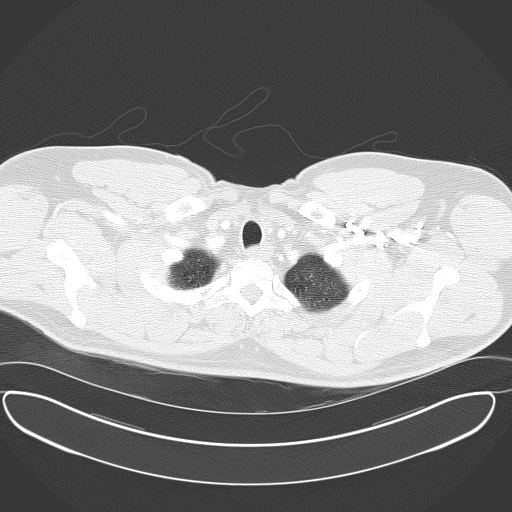

[16 of 32 positions shown; findings below may reference images not displayed]

PROCEDURE:     CT  - CT CHEST (FOR PE) W  - [DATE]  [DATE]

RESULT:     Emergent scan was performed for LEFT chest pain.  The report is
faxed to the Emergency Room. A good bolus of contrast is noted within the
pulmonary arteries. No filling defects are identified to suggest pulmonary
emboli.  No mediastinal or hilar masses are noted.  There is noted some
atelectasis seen in the RIGHT lung base posteriorly. No effusions are noted.
No pneumothoraces are noted.  The thoracic aorta appears intact.
IMPRESSION: 1)No definite pulmonary embolus seen.  Atelectasis is noted in the RIGHT
lung base posteriorly.

## 2006-05-07 ENCOUNTER — Emergency Department: Payer: Self-pay | Admitting: Emergency Medicine

## 2006-05-09 ENCOUNTER — Ambulatory Visit: Payer: Self-pay | Admitting: Unknown Physician Specialty

## 2006-05-09 IMAGING — CT CT ABD-PELV W/ CM
1 of 2 series · 16 of 32 positions shown, 20 images · IV contrast (agent unspecified)
Comparison: none

REASON FOR EXAM: Rectal bleeding
COMMENTS:

PROCEDURE:     CT  - CT ABDOMEN / PELVIS  W  - [DATE]  [DATE]
RESULT:     Axial images were obtained of the abdomen and pelvis with
contrast material.

[Series 2: abdomen · axial · 0.80mm/px · z∈[-525,-85]mm · 16 of 61 slices shown, 20 images]
[im 3/61  soft-tissue]
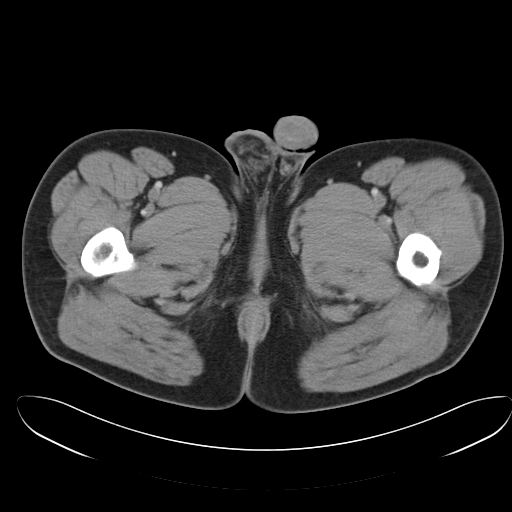
[im 3/61  bone]
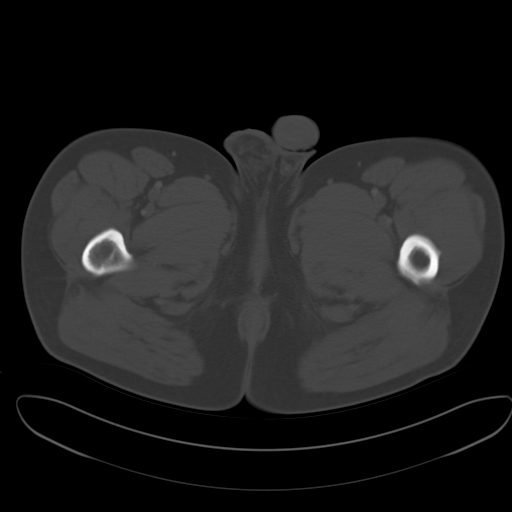
[im 7/61  soft-tissue]
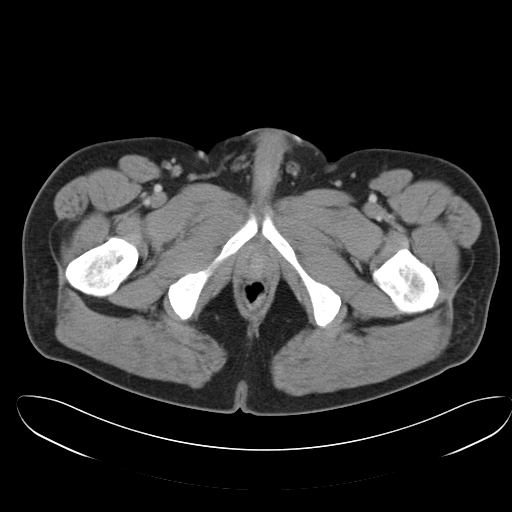
[im 12/61  soft-tissue]
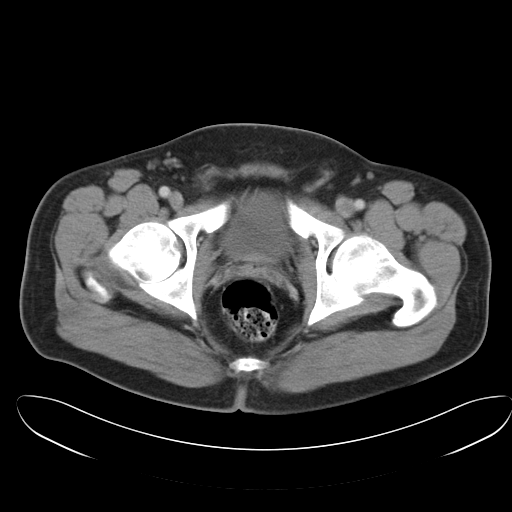
[im 17/61  soft-tissue]
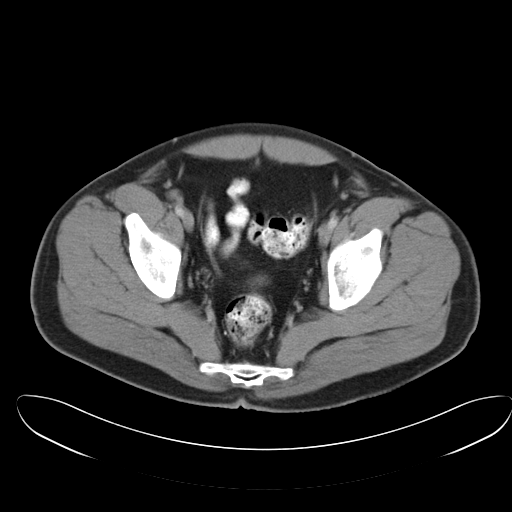
[im 21/61  soft-tissue]
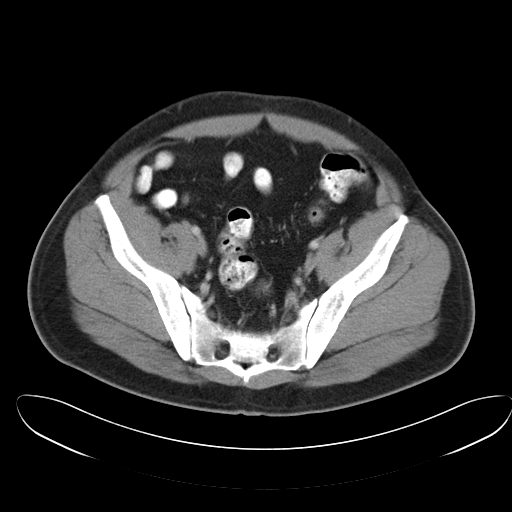
[im 24/61  soft-tissue]
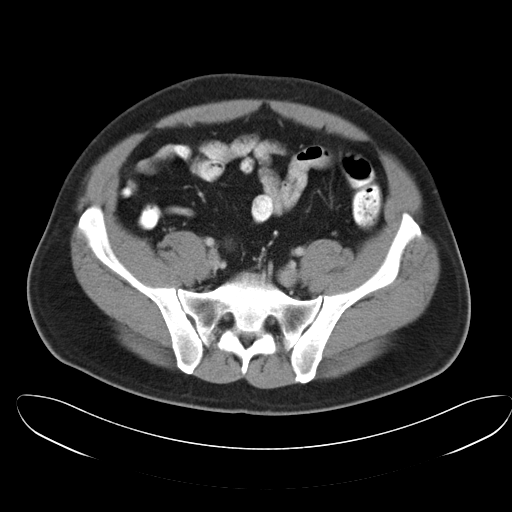
[im 28/61  soft-tissue]
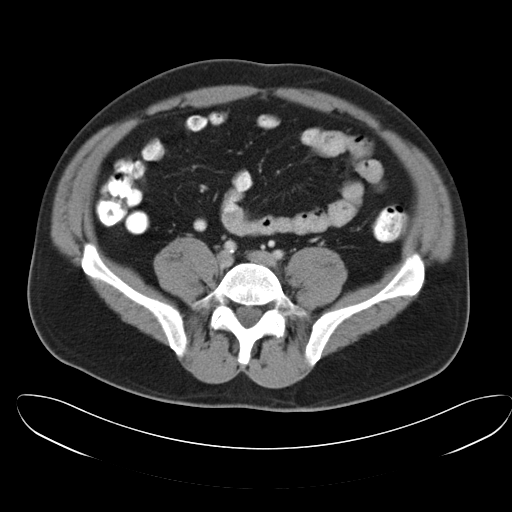
[im 33/61  soft-tissue]
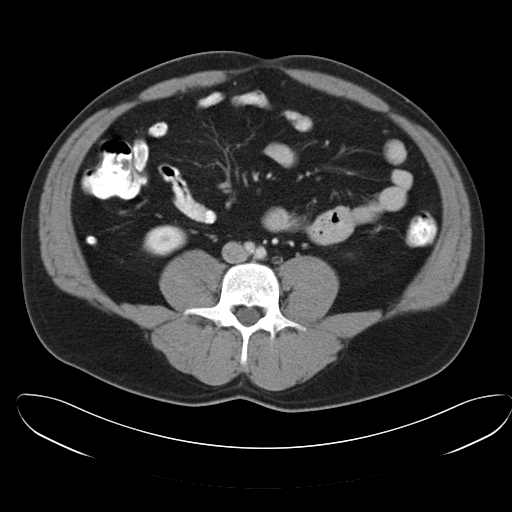
[im 37/61  soft-tissue]
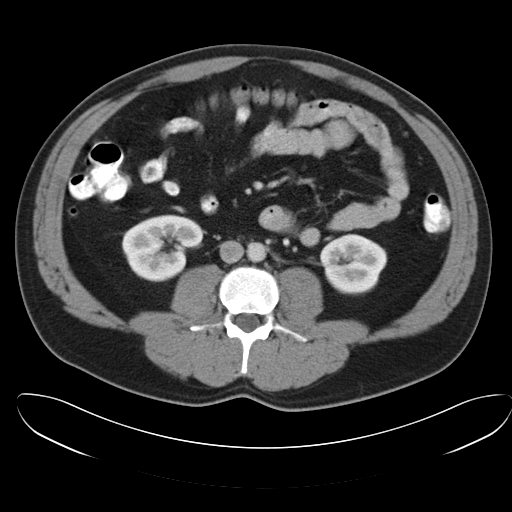
[im 37/61  bone]
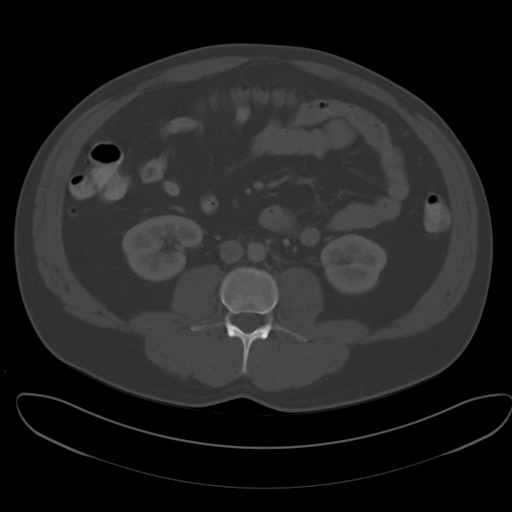
[im 40/61  soft-tissue]
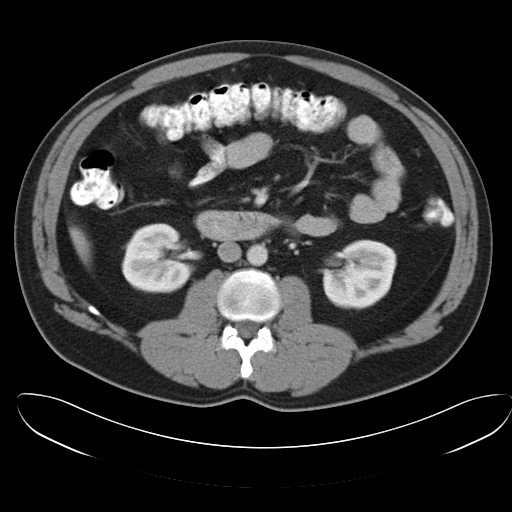
[im 44/61  soft-tissue]
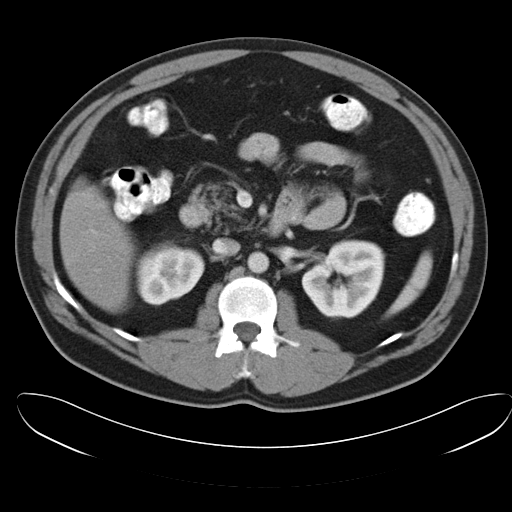
[im 49/61  soft-tissue]
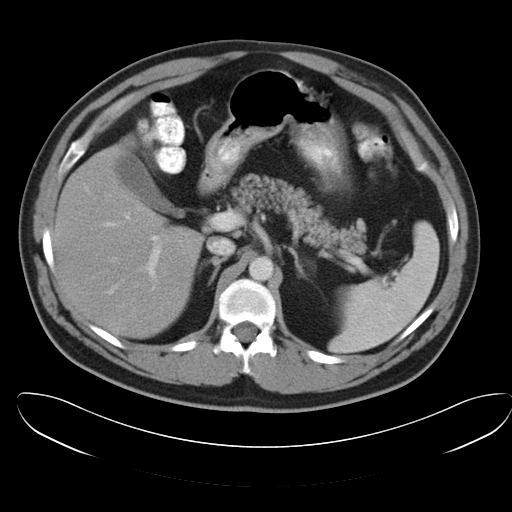
[im 51/61  lung]
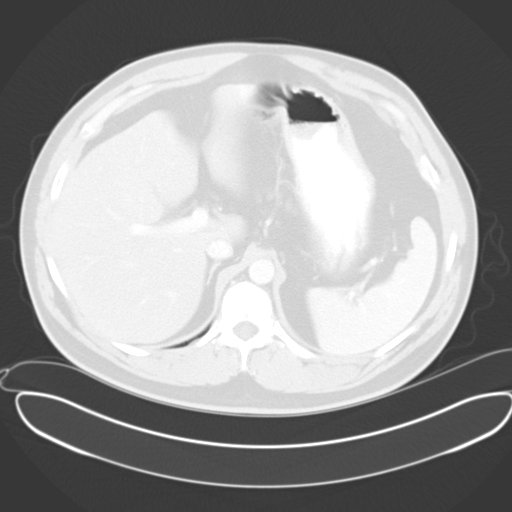
[im 54/61  soft-tissue]
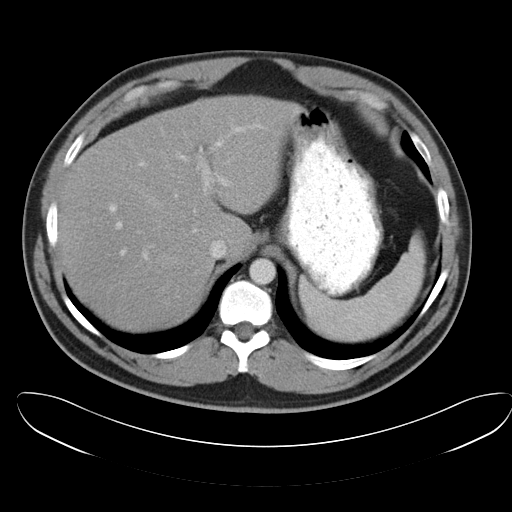
[im 54/61  lung]
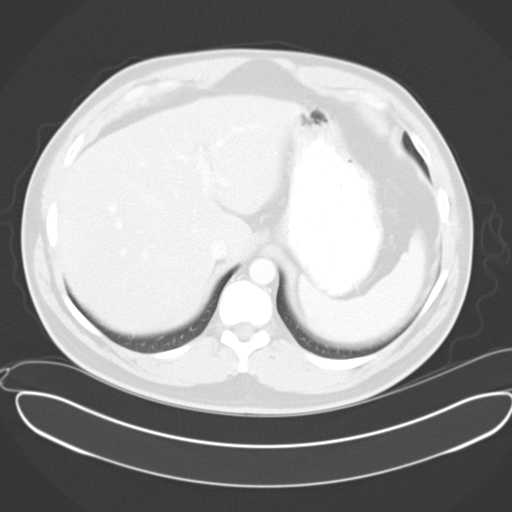
[im 56/61  lung]
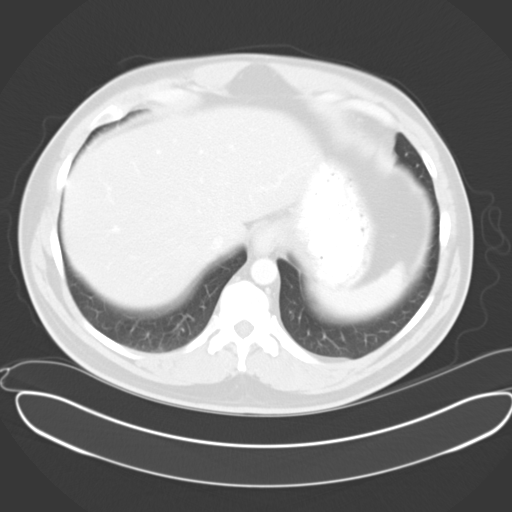
[im 58/61  soft-tissue]
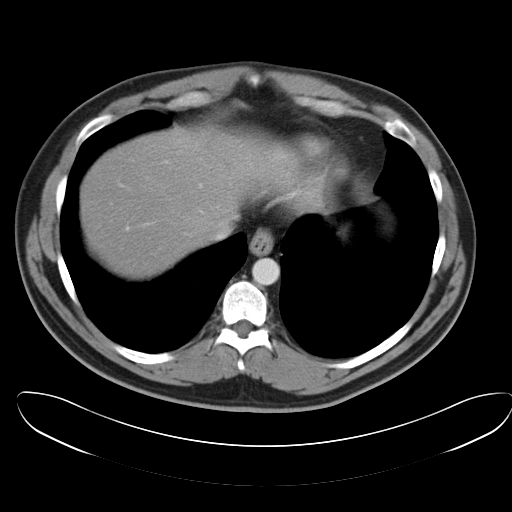
[im 58/61  lung]
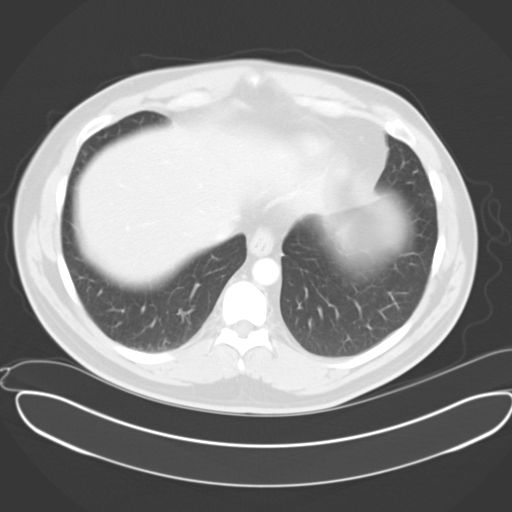

[16 of 32 positions shown; findings below may reference images not displayed]

FINDINGS: The liver and spleen appear intact with no focal masses. Both
kidneys excrete the contrast with no masses or hydronephrosis. The pancreas
and adrenals are intact. No ascites is present. There is no appendicitis. No
retroperitoneal lymphadenopathy is noted. No pelvic adenopathy is seen. No
diverticulitis is identified.
IMPRESSION: No significant abnormality is noted on this abdominal/pelvic CT.

## 2006-05-12 ENCOUNTER — Ambulatory Visit: Payer: Self-pay | Admitting: Unknown Physician Specialty

## 2006-08-29 ENCOUNTER — Emergency Department: Payer: Self-pay | Admitting: Emergency Medicine

## 2006-12-15 ENCOUNTER — Emergency Department: Payer: Self-pay | Admitting: Internal Medicine

## 2007-06-07 ENCOUNTER — Emergency Department: Payer: Self-pay | Admitting: Unknown Physician Specialty

## 2007-08-17 ENCOUNTER — Ambulatory Visit: Payer: Self-pay | Admitting: Specialist

## 2007-08-23 ENCOUNTER — Observation Stay: Payer: Self-pay | Admitting: Internal Medicine

## 2007-08-23 ENCOUNTER — Other Ambulatory Visit: Payer: Self-pay

## 2007-08-23 IMAGING — CR DG CHEST 2V
1 series · 2 of 2 positions shown · non-contrast
Comparison: none

REASON FOR EXAM: dyspnea
COMMENTS:

PROCEDURE:     DXR - DXR CHEST PA (OR AP) AND LATERAL  - [DATE]  [DATE]
RESULT:     The lungs are adequately inflated and clear. The heart is top
normal in size. The pulmonary vascularity is not engorged. There is no
pleural effusion.

[Series 1: view not recorded · 0.17mm/px · 2 of 2 slices shown]
[im 1/2]
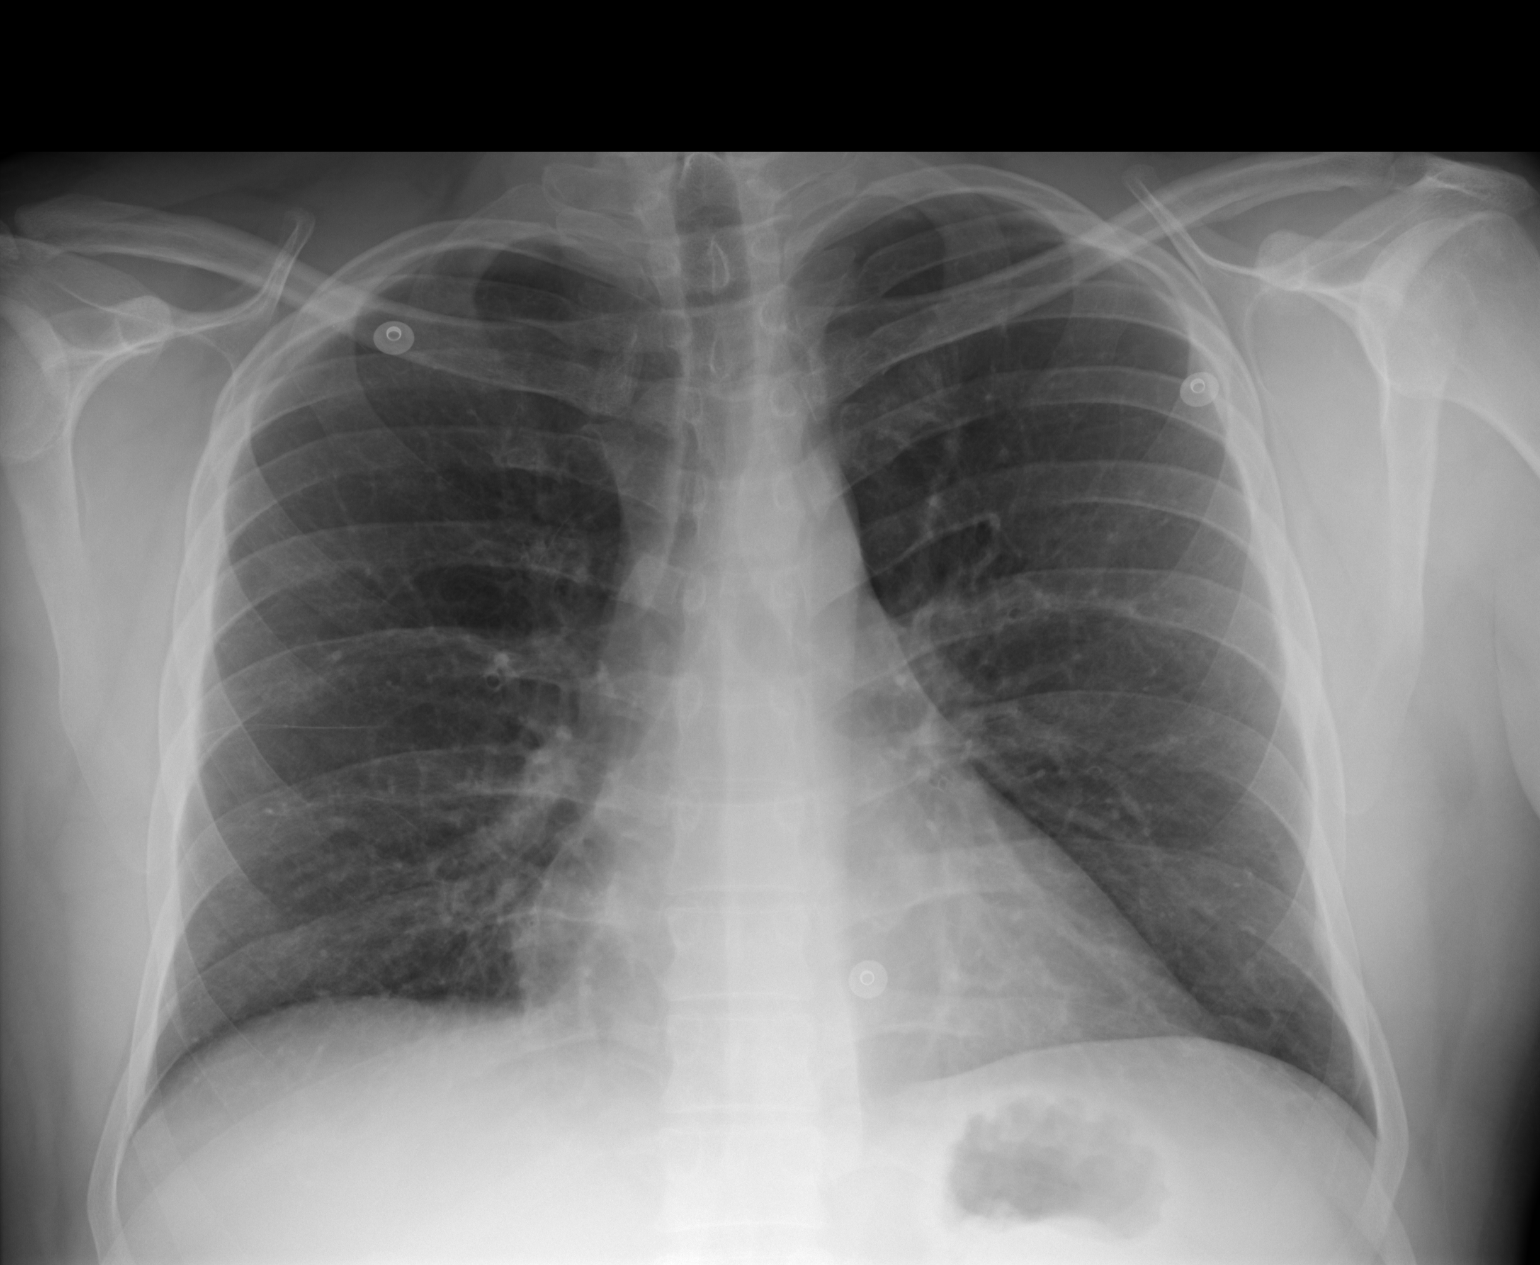
[im 2/2]
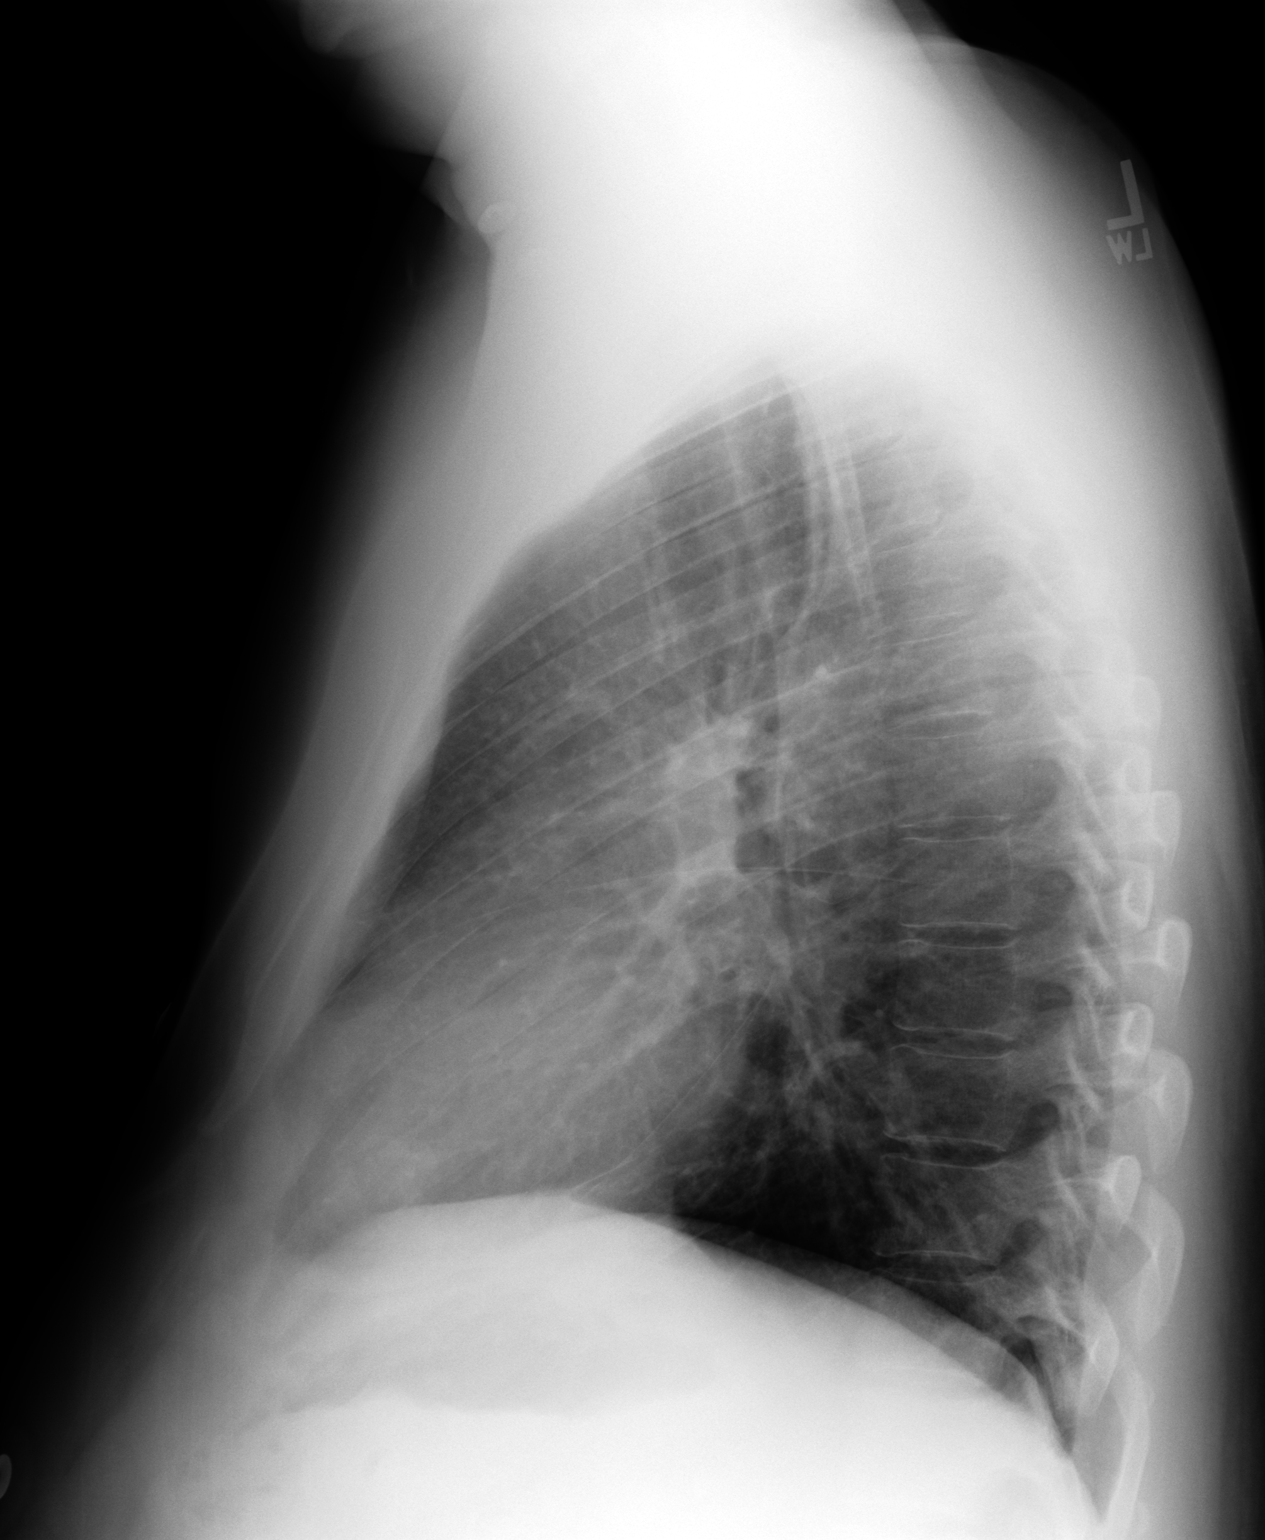

[2 of 2 positions shown; findings below may reference images not displayed]

IMPRESSION: I do not see evidence of pneumonia or other acute cardiopulmonary
abnormality. I cannot exclude an element of acute bronchitis in the
appropriate clinical setting. Followup films following any therapy would be
of value to assure stability or clearing.

## 2007-11-02 ENCOUNTER — Ambulatory Visit: Payer: Self-pay | Admitting: Specialist

## 2008-01-17 IMAGING — CR DG CHEST 2V
1 series · 2 of 2 positions shown · non-contrast
Comparison: none

REASON FOR EXAM: cough
COMMENTS:

PROCEDURE:     DXR - DXR CHEST PA (OR AP) AND LATERAL  - [DATE]  [DATE]
RESULT:     The lungs are well expanded and clear. The heart and mediastinal
structures are normal in appearance. There is no pleural effusion.

[Series 1: view not recorded · 0.17mm/px · 2 of 2 slices shown]
[im 1/2]
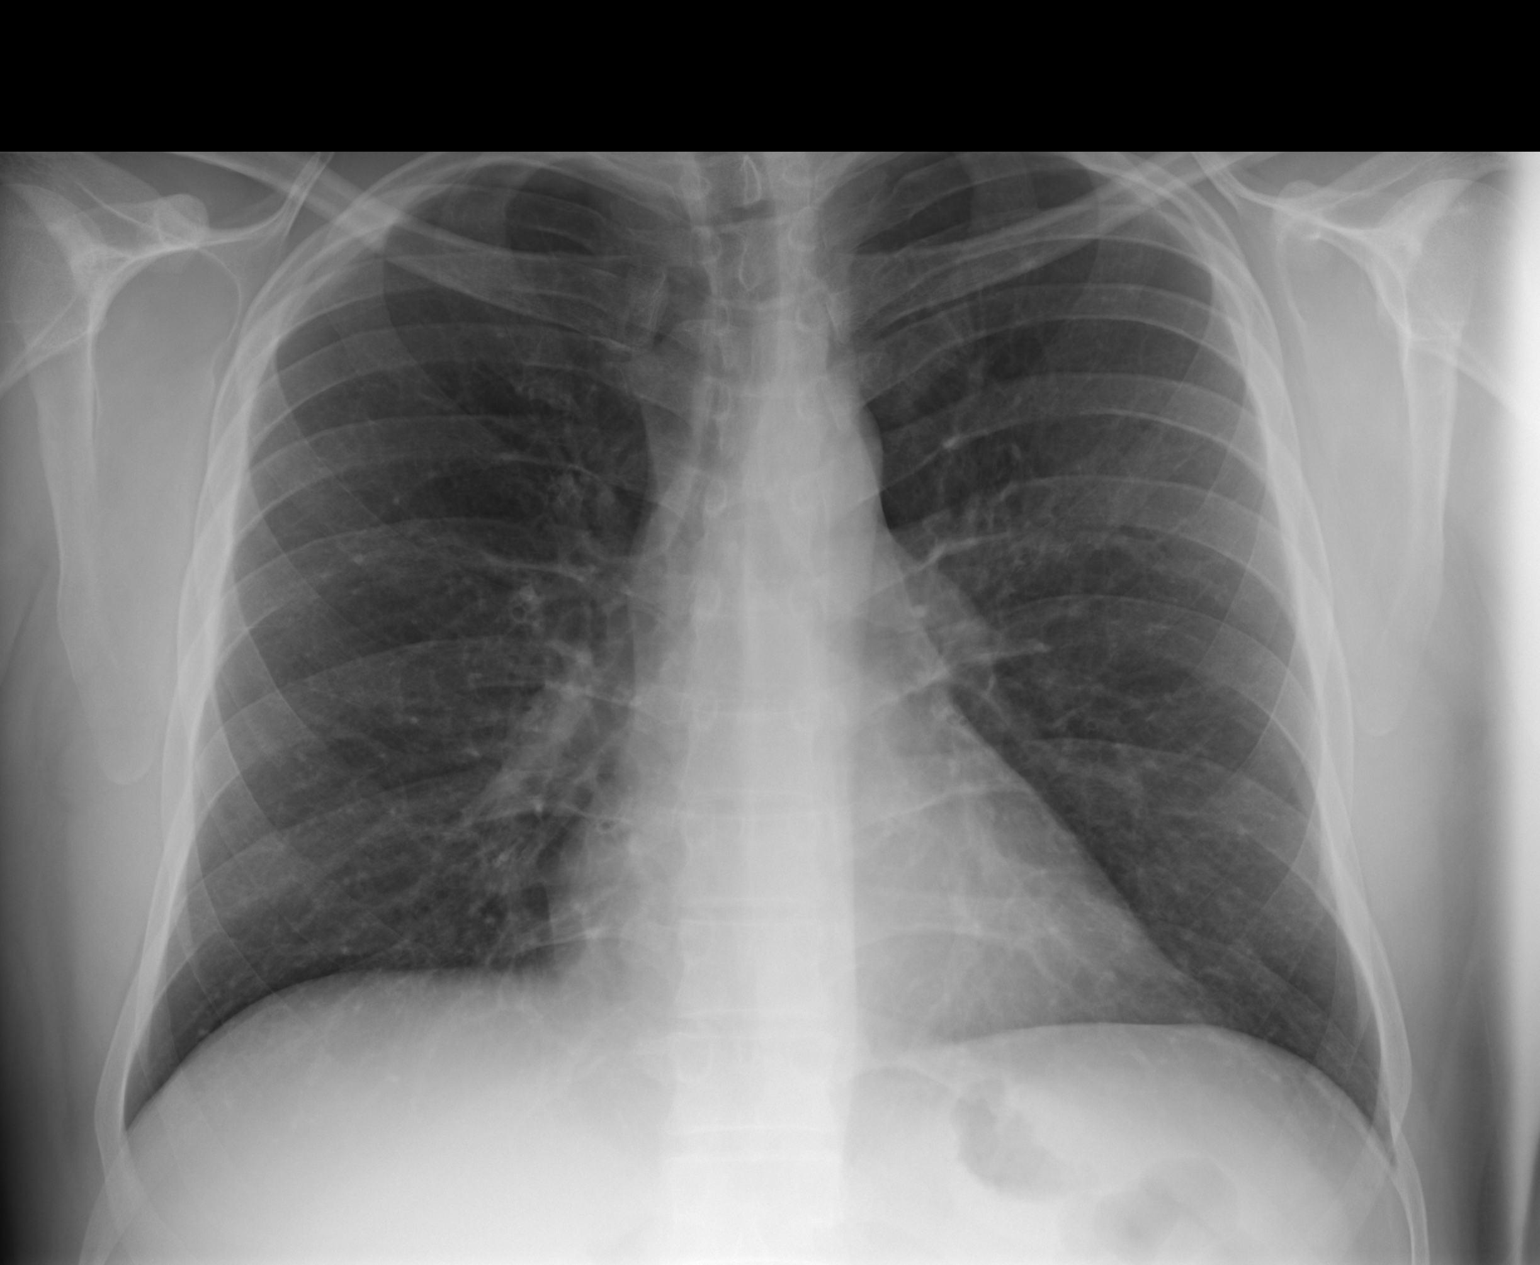
[im 2/2]
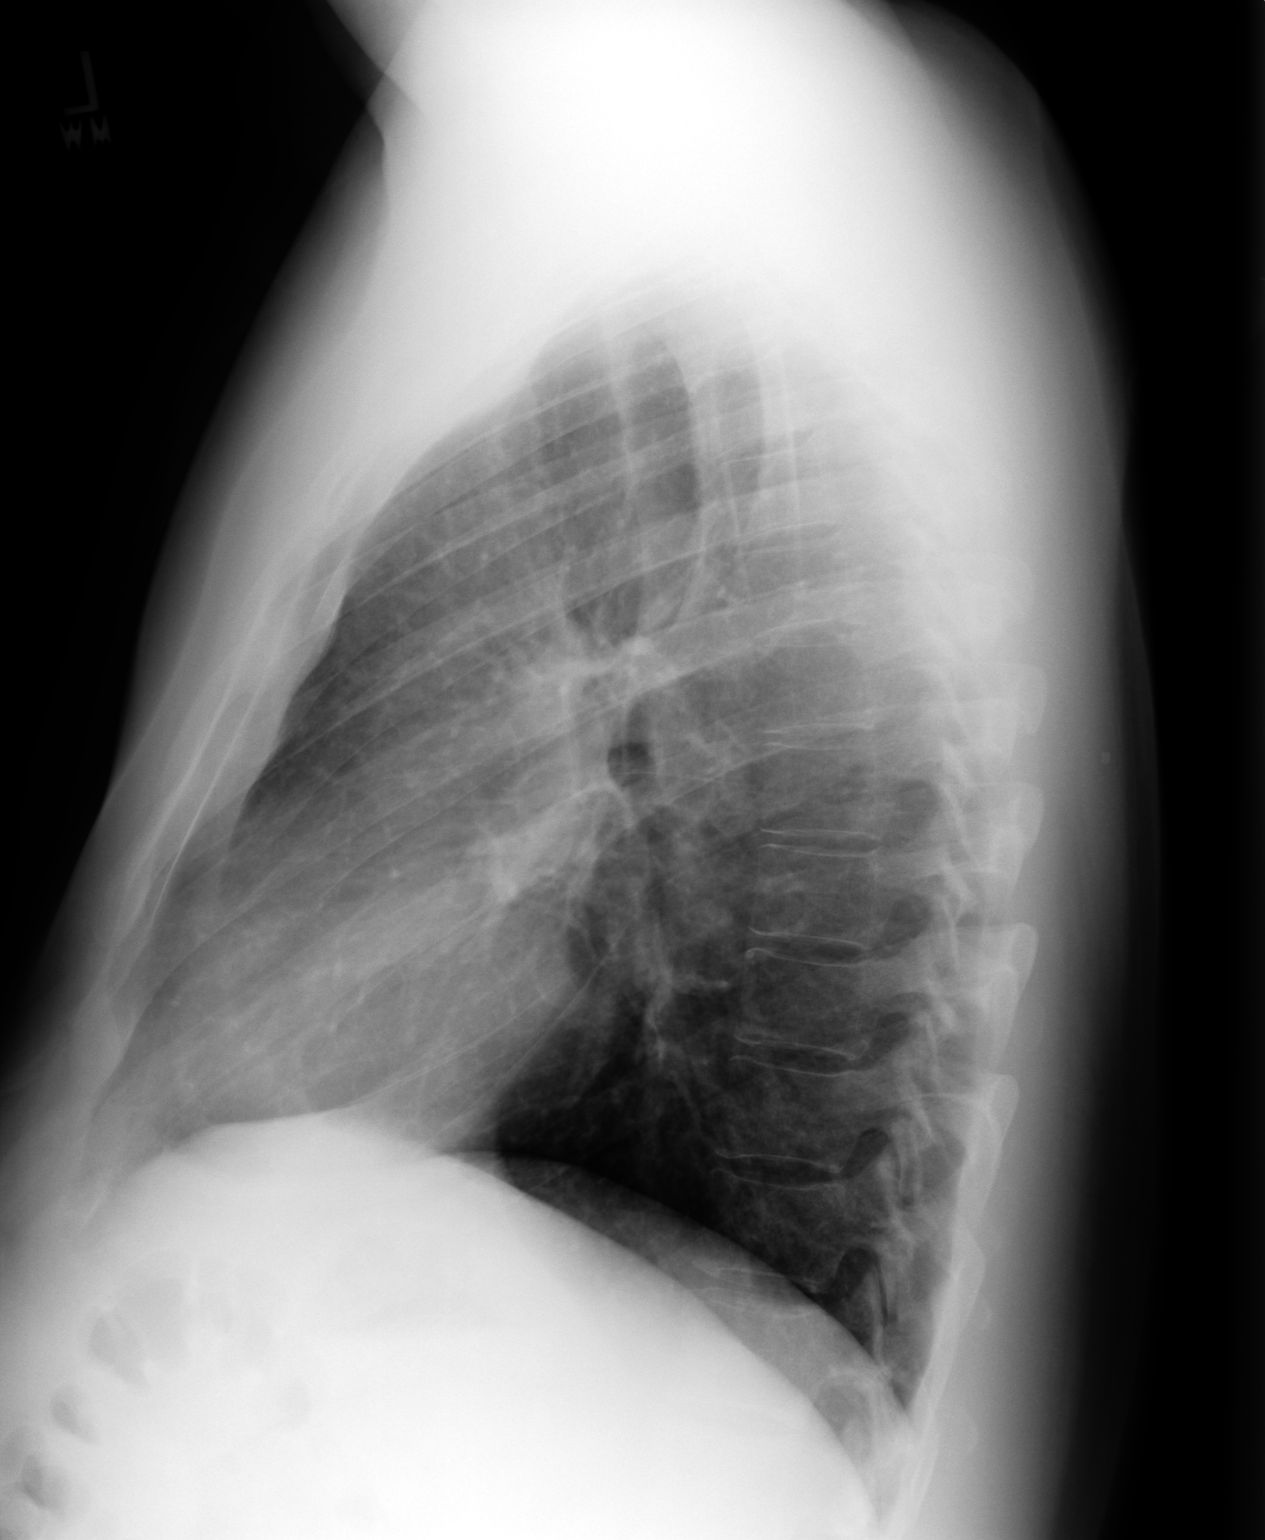

[2 of 2 positions shown; findings below may reference images not displayed]

IMPRESSION: I do not see evidence of acute cardiopulmonary disease. I cannot exclude an
element of acute bronchitis in the appropriate clinical setting.

## 2008-07-24 ENCOUNTER — Emergency Department: Payer: Self-pay | Admitting: Emergency Medicine

## 2008-07-24 IMAGING — CR DG CHEST 2V
1 series · 2 of 2 positions shown · non-contrast
Comparison: none

REASON FOR EXAM: chest pain
COMMENTS:

[Series 1: view not recorded · 0.17mm/px · 2 of 2 slices shown]
[im 1/2]
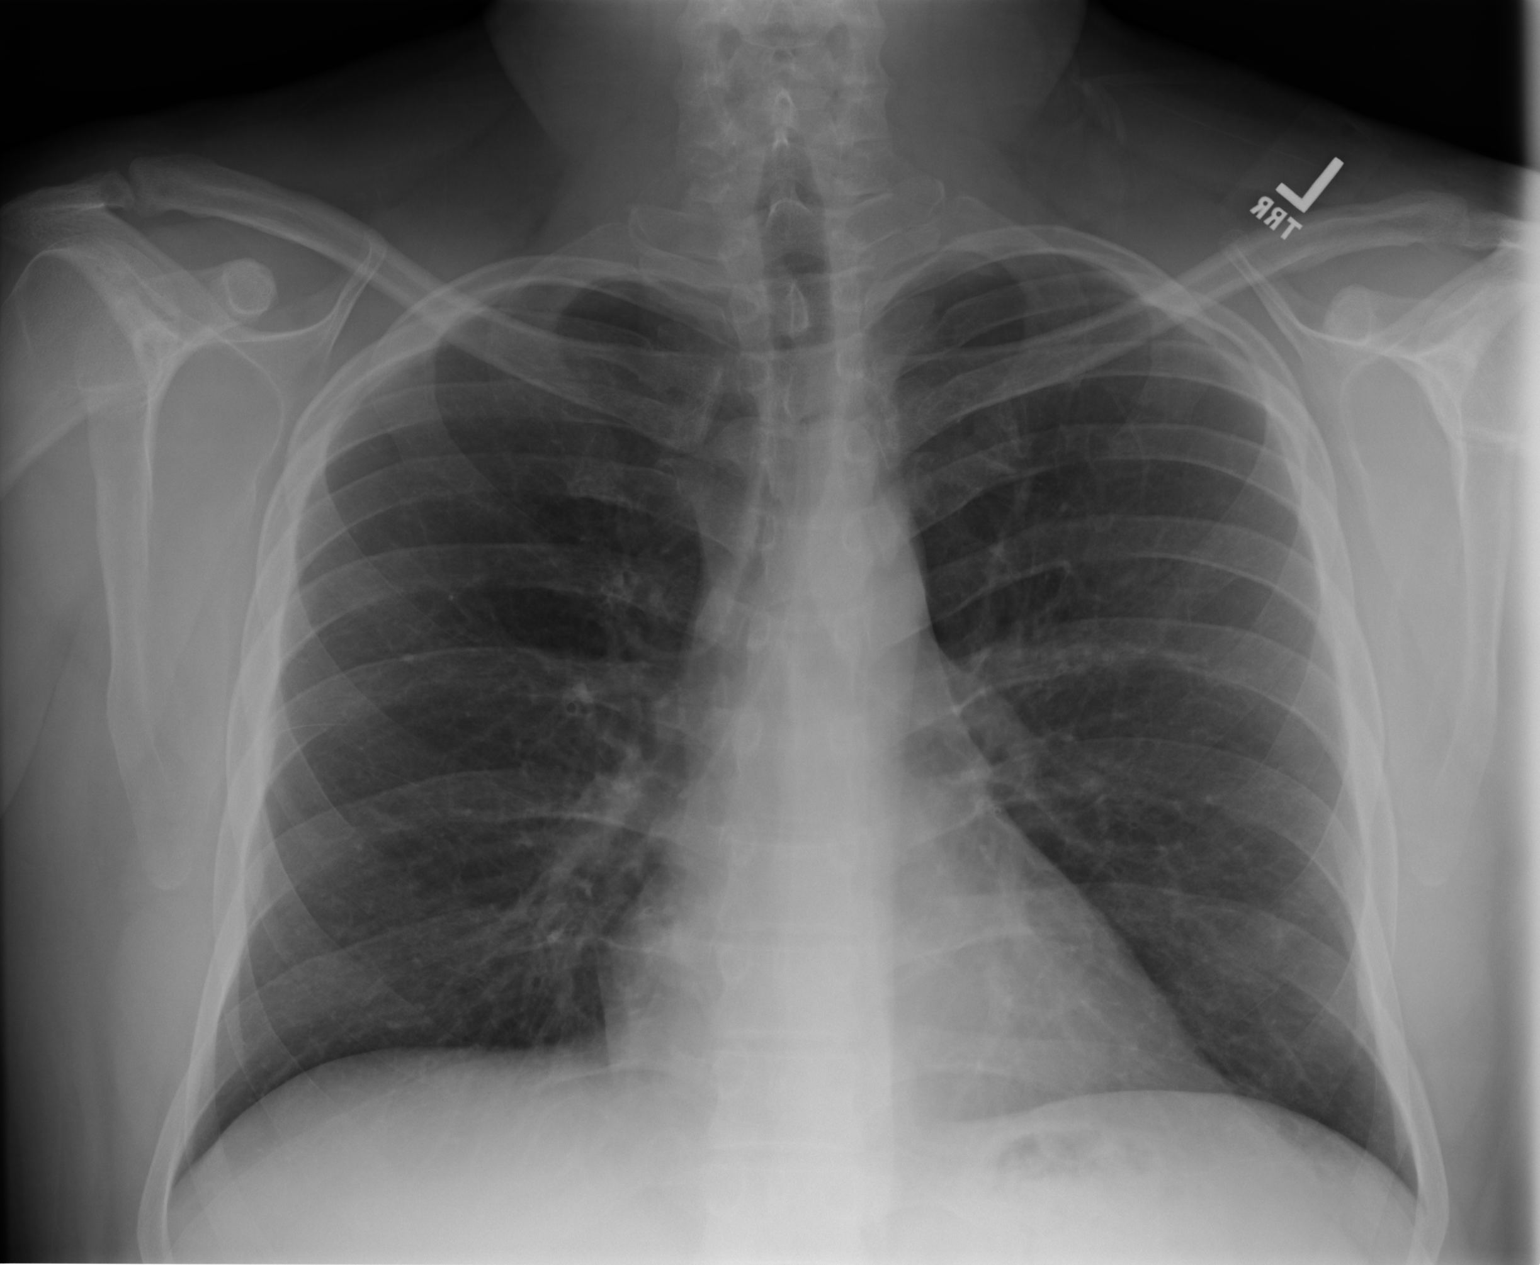
[im 2/2]
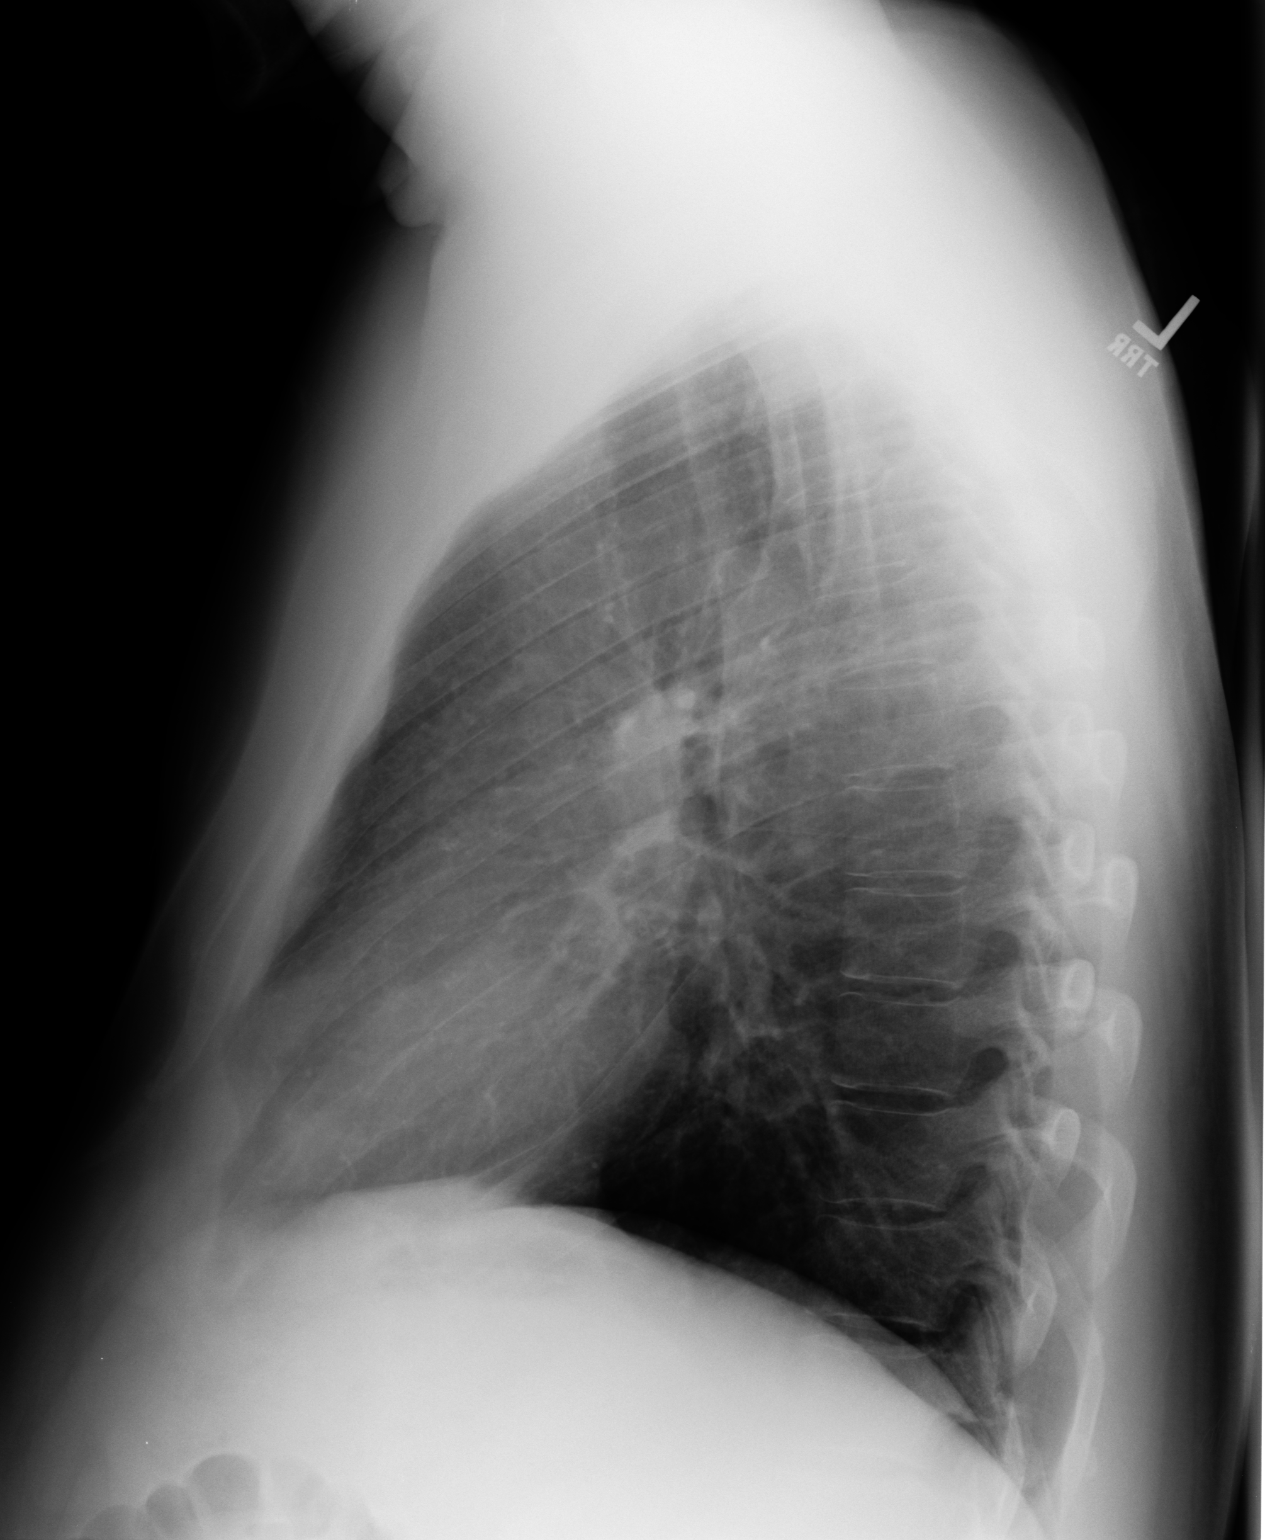

[2 of 2 positions shown; findings below may reference images not displayed]

PROCEDURE:     DXR - DXR CHEST PA (OR AP) AND LATERAL  - [DATE]  [DATE]

RESULT:     Comparison is made to study dated [DATE].

The lungs are adequately inflated. There is no focal infiltrate. The cardiac
silhouette is normal in size. Very mildly increased perihilar lung markings
are present but appears stable. There is no evidence of a pleural effusion.
The trachea is midline. There is no pneumothorax. The observed portions of
the bony thorax appear intact.
IMPRESSION: I do not see evidence of CHF nor of pneumonia. Very mildly
increased perihilar lung markings are noted but are not new.

## 2011-02-15 ENCOUNTER — Emergency Department: Payer: Self-pay | Admitting: *Deleted

## 2012-02-24 ENCOUNTER — Ambulatory Visit: Payer: Self-pay | Admitting: Family Medicine

## 2012-02-24 LAB — RAPID INFLUENZA A&B ANTIGENS

## 2012-06-07 ENCOUNTER — Emergency Department: Payer: Self-pay | Admitting: Emergency Medicine

## 2012-06-07 IMAGING — CR DG SHOULDER 3+V*L*
1 series · 4 of 4 positions shown · non-contrast
Comparison: none

REASON FOR EXAM: pain
COMMENTS:

PROCEDURE:     DXR - DXR SHOULDER LEFT COMPLETE  - [DATE]  [DATE]
RESULT:     Left shoulder images demonstrate the humeral head appears intact
and is located in the glenoid. The clavicle and scapula as well as the
included ribs appear unremarkable.

[Series 1: w shoulder external left · 0.14mm/px · 4 of 4 slices shown]
[im 1/4]
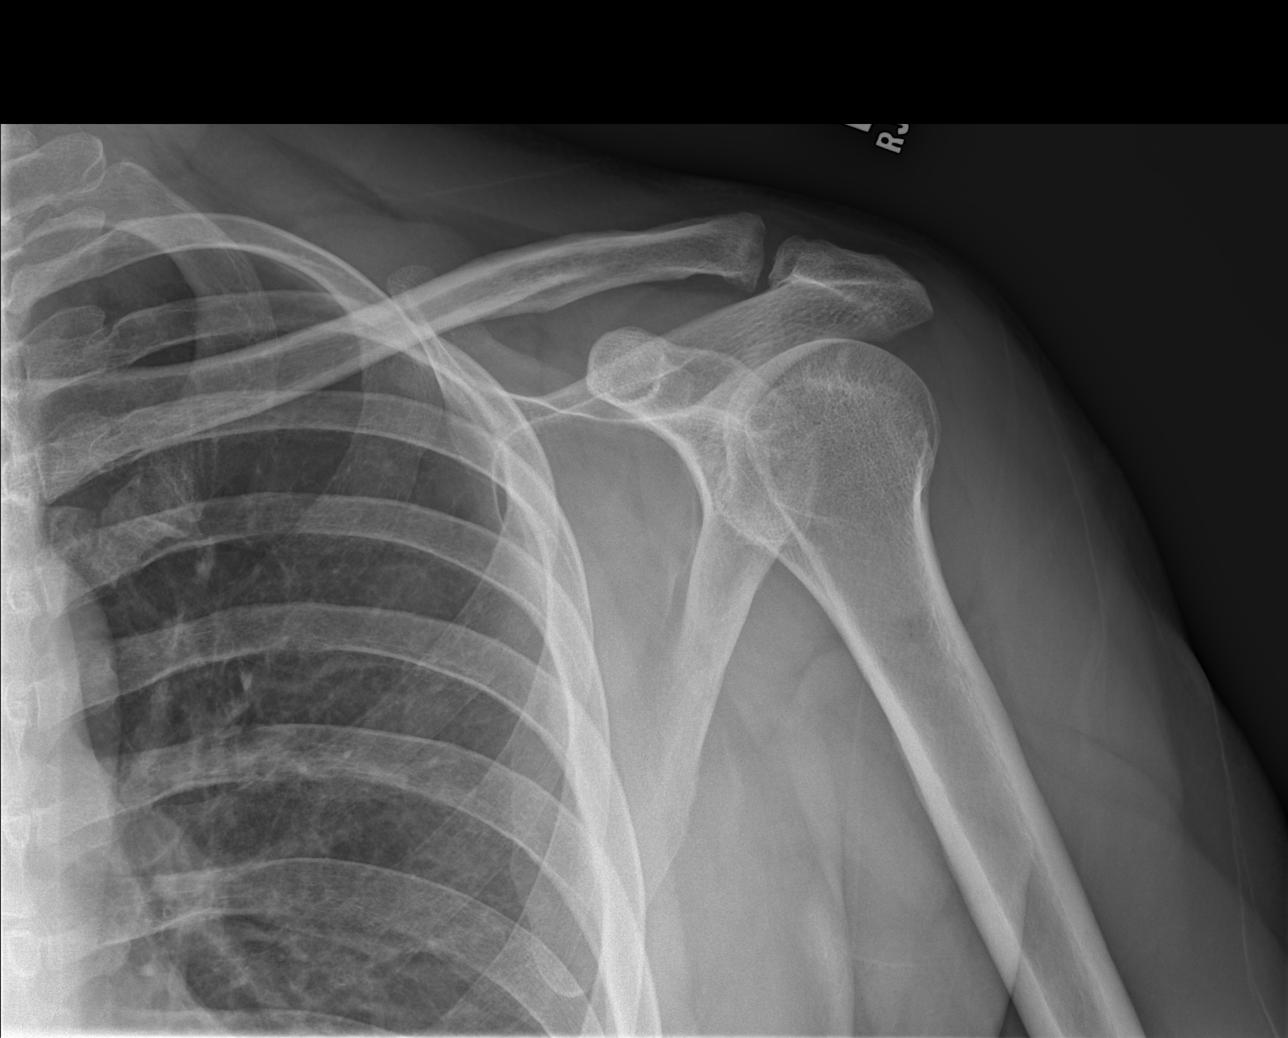
[im 2/4]
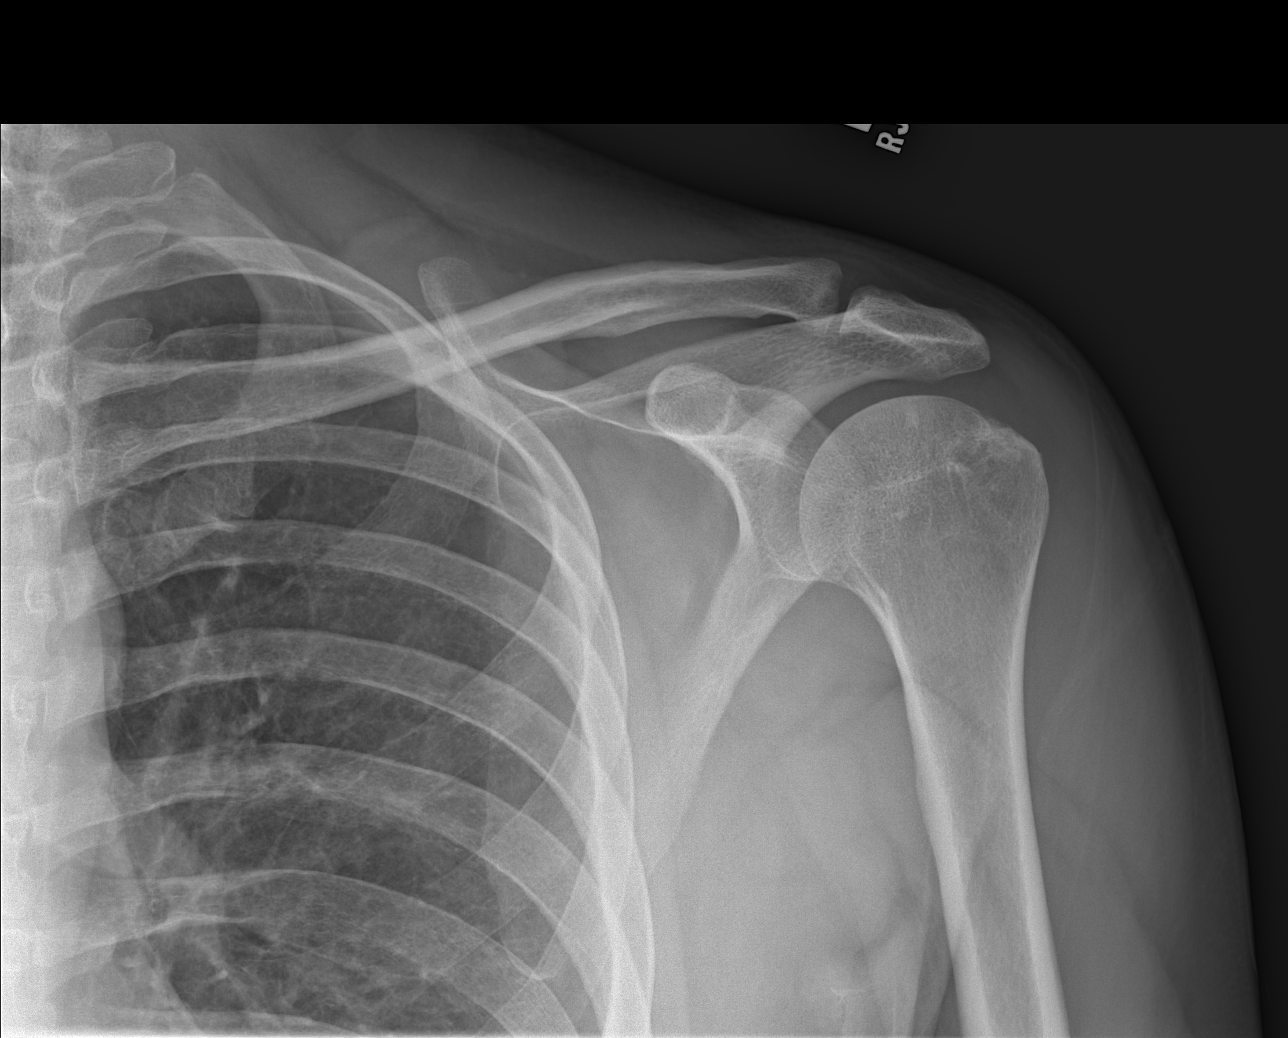
[im 3/4]
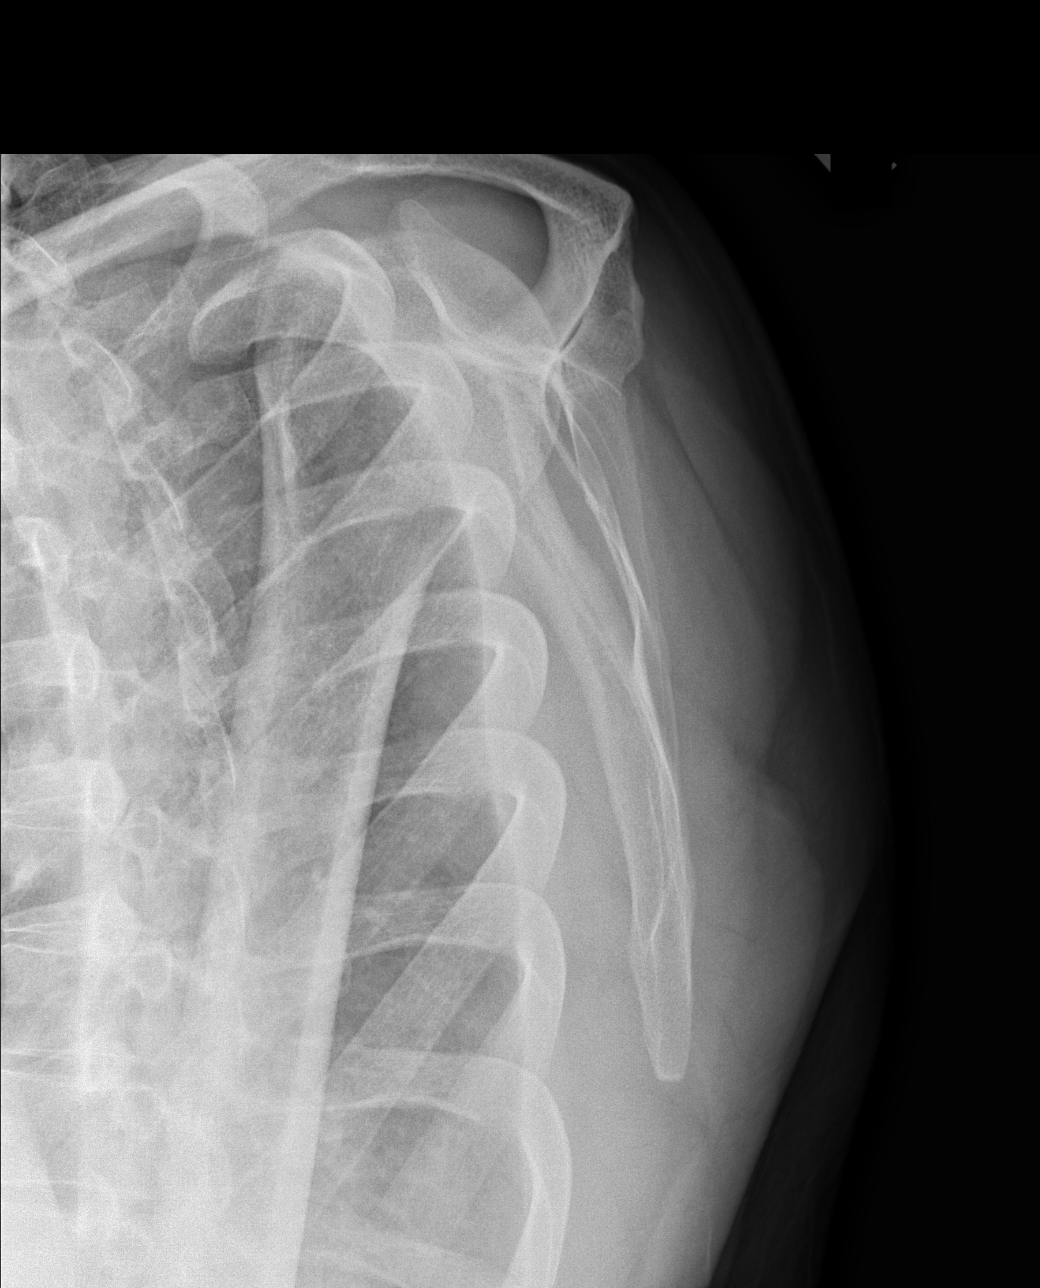
[im 4/4]
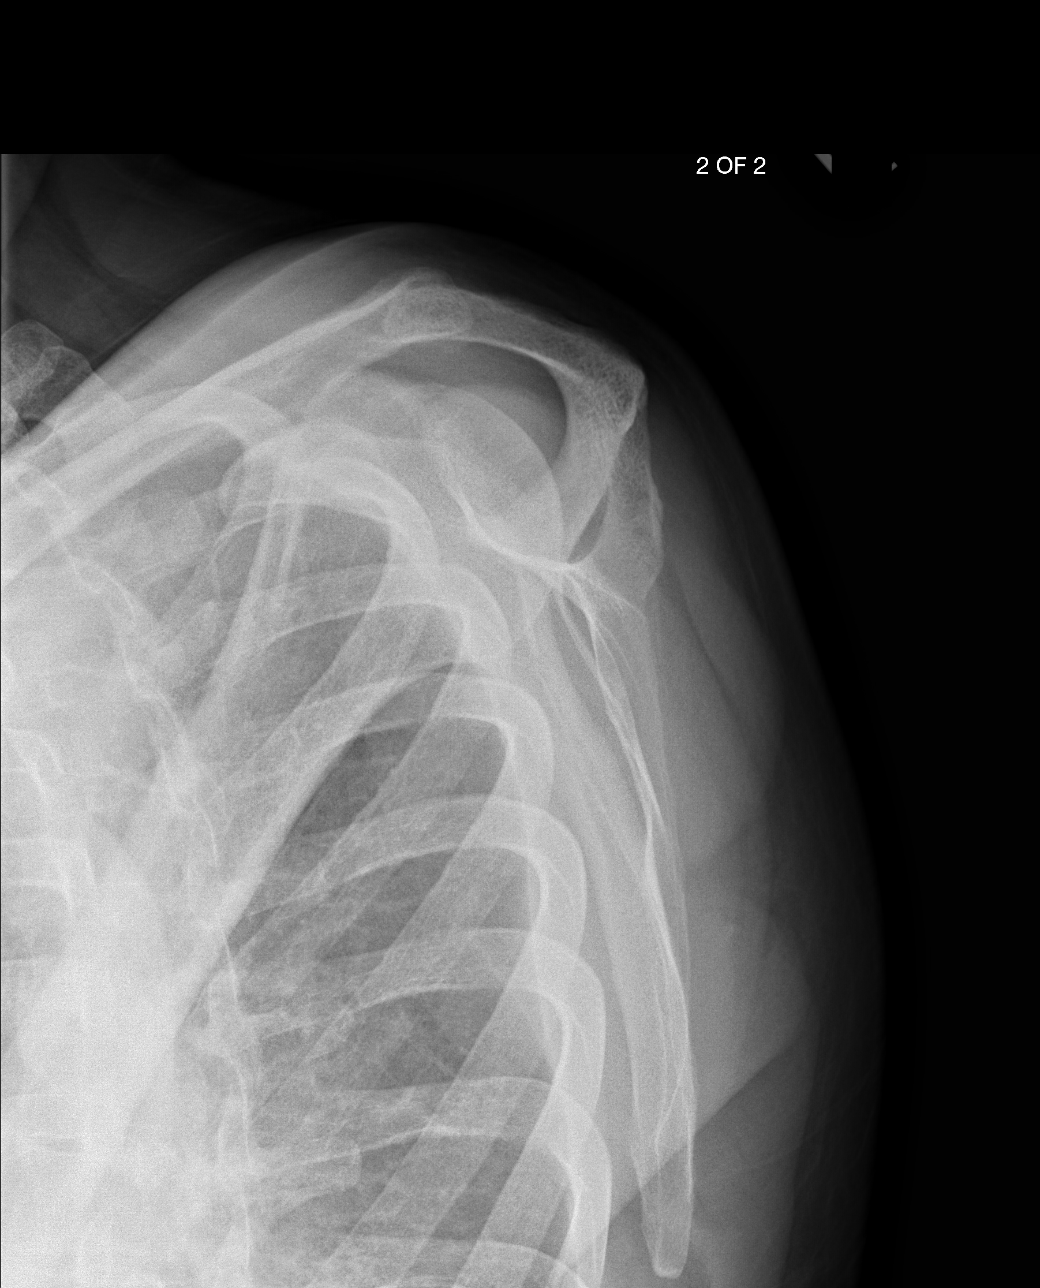

[4 of 4 positions shown; findings below may reference images not displayed]

IMPRESSION: 1. No acute bony abnormality.

[REDACTED]

## 2012-08-06 ENCOUNTER — Ambulatory Visit: Payer: Self-pay | Admitting: Orthopedic Surgery

## 2012-08-06 DIAGNOSIS — Z01810 Encounter for preprocedural cardiovascular examination: Secondary | ICD-10-CM

## 2012-08-06 LAB — CBC
HGB: 15.2 g/dL (ref 13.0–18.0)
MCHC: 34.8 g/dL (ref 32.0–36.0)
MCV: 86 fL (ref 80–100)
Platelet: 219 10*3/uL (ref 150–440)
RBC: 5.07 10*6/uL (ref 4.40–5.90)
RDW: 13.3 % (ref 11.5–14.5)
WBC: 10.5 10*3/uL (ref 3.8–10.6)

## 2012-08-06 LAB — URINALYSIS, COMPLETE
Bilirubin,UR: NEGATIVE
Glucose,UR: NEGATIVE mg/dL (ref 0–75)
Ketone: NEGATIVE
Protein: NEGATIVE
RBC,UR: <1 /HPF (ref 0–5)
WBC UR: <1 /HPF (ref 0–5)

## 2012-08-06 LAB — BASIC METABOLIC PANEL
Anion Gap: 7 (ref 7–16)
BUN: 7 mg/dL (ref 7–18)
Calcium, Total: 9.2 mg/dL (ref 8.5–10.1)
Co2: 28 mmol/L (ref 21–32)
Glucose: 94 mg/dL (ref 65–99)
Osmolality: 272 (ref 275–301)
Potassium: 4.1 mmol/L (ref 3.5–5.1)
Sodium: 137 mmol/L (ref 136–145)

## 2012-08-06 LAB — APTT: Activated PTT: 26.7 secs (ref 23.6–35.9)

## 2012-08-06 LAB — PROTIME-INR
INR: 0.9
Prothrombin Time: 12.2 secs (ref 11.5–14.7)

## 2012-08-06 IMAGING — CR DG CHEST 2V
1 series · 2 of 2 positions shown · non-contrast
Comparison: none

REASON FOR EXAM: smoker
COMMENTS:

PROCEDURE:     DXR - DXR CHEST PA (OR AP) AND LATERAL  - [DATE] [DATE]
RESULT:     The lungs are clear. The cardiac silhouette and visualized bony
skeleton are unremarkable.

[Series 1: w chest pa · 0.14mm/px · 2 of 2 slices shown]
[im 1/2]
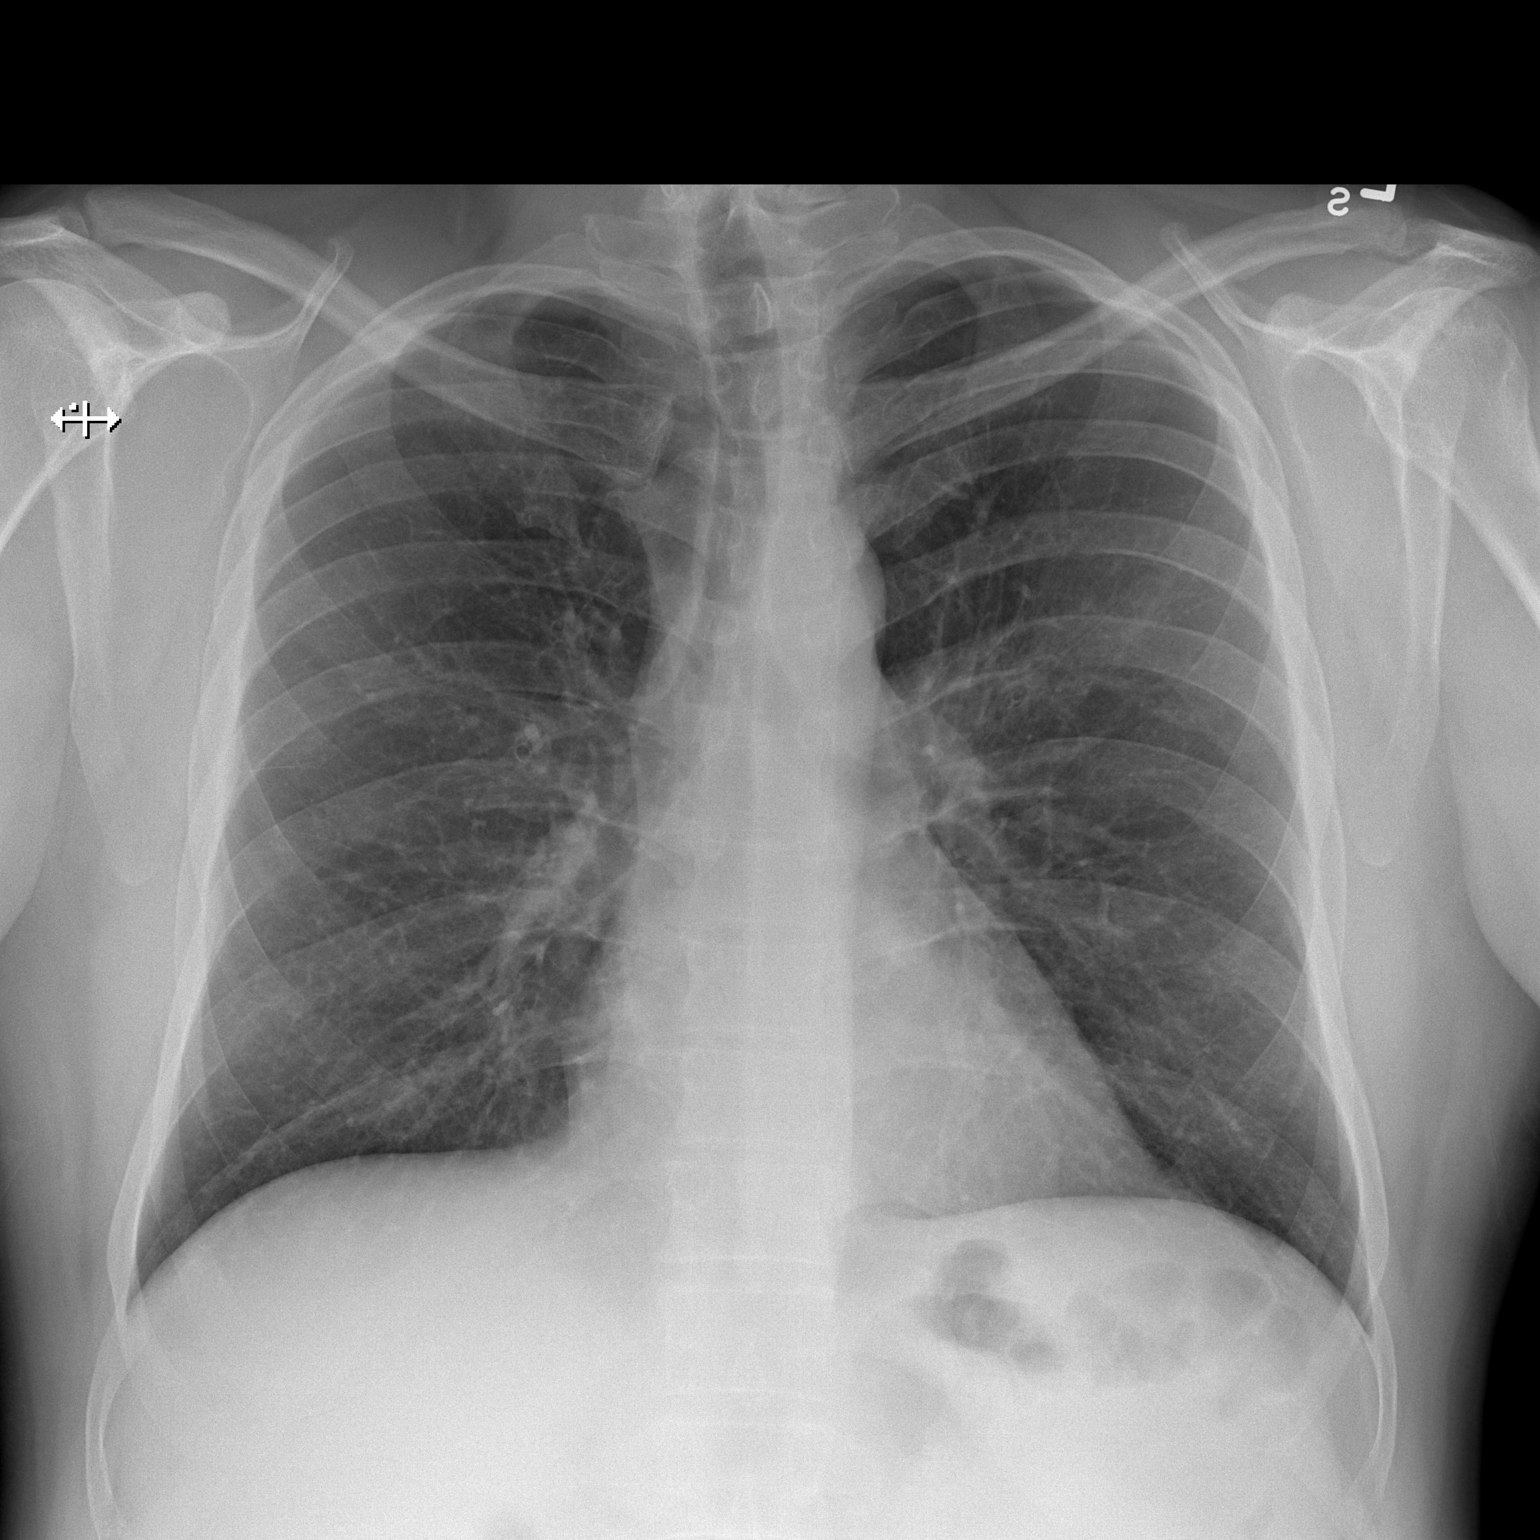
[im 2/2]
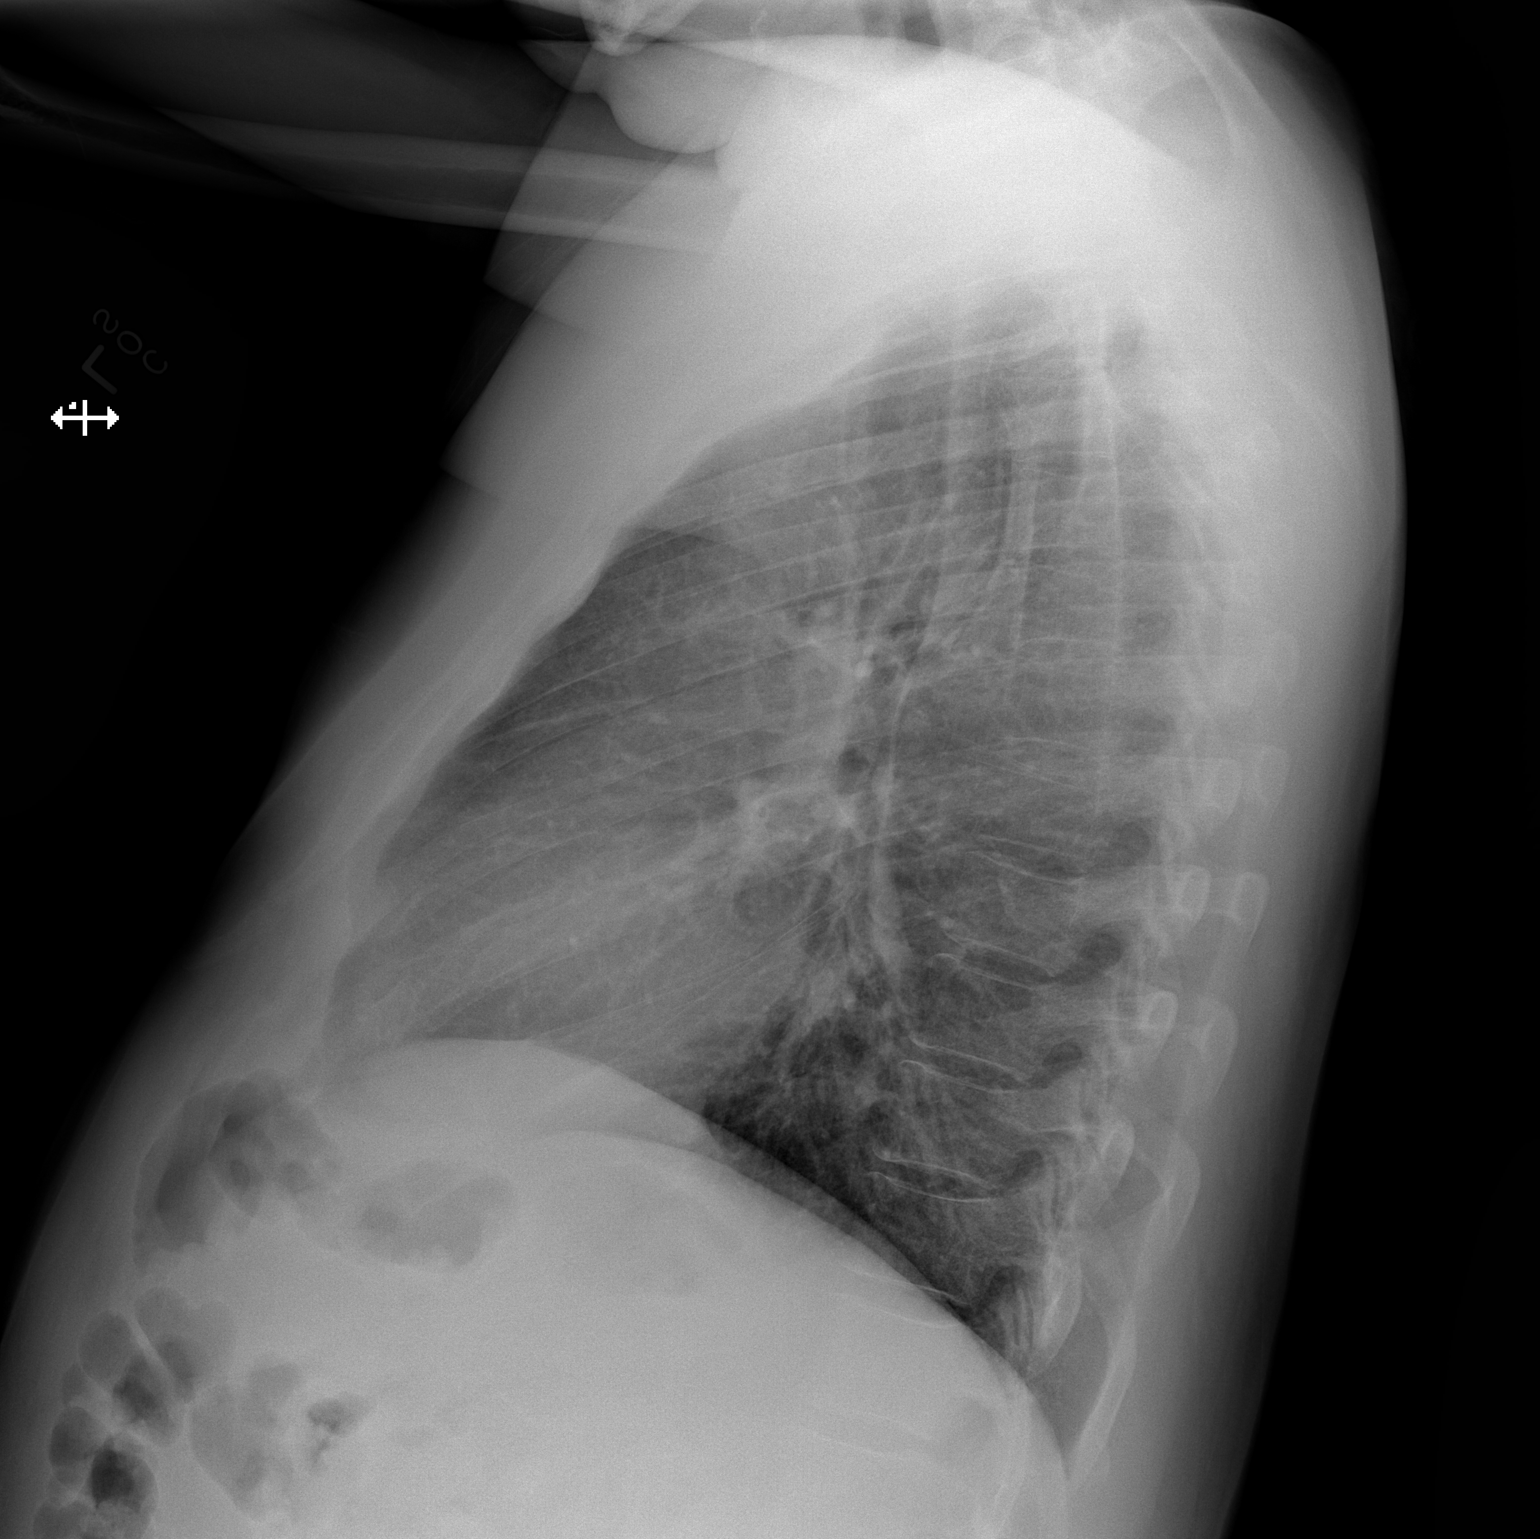

[2 of 2 positions shown; findings below may reference images not displayed]

IMPRESSION: 1. Chest radiograph without evidence of acute cardiopulmonary disease.

## 2012-08-07 ENCOUNTER — Ambulatory Visit: Payer: Self-pay | Admitting: Orthopedic Surgery

## 2013-12-08 ENCOUNTER — Emergency Department: Payer: Self-pay | Admitting: Emergency Medicine

## 2014-05-02 NOTE — Op Note (Signed)
PATIENT NAME:  Henry Anderson, Henry Anderson MR#:  [id number] DATE OF BIRTH:  1965/07/25  DATE OF PROCEDURE:  08/09/2012  PREOPERATIVE DIAGNOSES: Left shoulder rotator cuff tear, impingement syndrome, biceps tenosynovitis.   POSTOPERATIVE DIAGNOSES: Left shoulder rotator cuff tear, impingement syndrome, biceps tenosynovitis.   PROCEDURE PERFORMED: Arthroscopic repair, rotator cuff, left shoulder; subacromial decompression and bursectomy; biceps tenodesis.   SURGEON: Murlean HarkShalini Alexie Lanni, M.D.   ASSISTANT: Dedra Skeensodd Mundy, PA-C.   ANESTHESIA: General and interscalene block.   ESTIMATED BLOOD LOSS: Minimal.   SURGICAL FINDINGS:  1.  Complete tear, supraspinatus muscle.  2.  Severe inflammation of biceps tendon.  3.  Significant synovitis throughout shoulder.  4.  Downward sloping acromion.   COMPLICATIONS: No immediate intraoperative or postoperative complications were noted.   IMPLANTS:  1.  Arthrex SpeedBridge with BioComposite SwiveLock 4.75 x 19.1 mm.  2.  BioComposite PushLock suture anchor at 3.5 x 19.5 mm.   INDICATIONS FOR PROCEDURE: Henry Anderson is a 49 year old gentleman who presented to the office after an injury sustained at work to his left shoulder. He sustained a rotator cuff tear. He also demonstrated evidence of significant biceps tendinitis.   After extensive discussion regarding the risks and benefits of surgery, Henry Anderson underwent medical clearance and decided to proceed with surgical intervention. He is aware that with his 2-pack-per-day smoking history, he does have a higher risk of postoperative infection, stiffness, and failure of rotator cuff repair and biceps tenodesis. The patient is trying to cut down on smoking.   DESCRIPTION OF PROCEDURE: Henry Anderson was identified in the preoperative holding area. Left shoulder was identified as the operative site. He was brought into the operating room where interscalene block was administered. General anesthesia was administered. The patient was  placed on the table in a supine position. He was secured and all prominences were padded. The patient was then placed in a beach chair position with his head adequately secured and padded. Left upper extremity was prepared and draped in the usual sterile fashion.   A surgical timeout was performed identifying the patient, procedure, images, confirming consent form, confirming administration of preoperative antibiotics, and confirming skin preparation.   A standard posterior viewing portal was made. Arthroscope was inserted. Under direct visualization, an anterior portal was made. Probe was inserted through the anterior portal and biceps tendon was brought into the joint. The biceps tendon demonstrated severe flattening and extensive injection. Biceps tendon was cut. The proximal nub was trimmed back using a shaver until a stable edge was achieved. An extensive anterior release was performed given the  significant amount of synovitis and scar tissue noted in the anterior capsule.   The rotator cuff was identified. There was an obvious tear of the supraspinatus muscle. There was some labral fraying which was gently debrided. Extensive synovitis was debrided. A PDS suture was passed into the joint from the lateral entry point directly through the rotator cuff tear.   Attention was now turned to the subacromial space. Extensive synovectomy was performed for visualization. The acromionizer was used to perform a subacromial decompression. The rotator cuff tear was identified. The tendon edge was debrided until a clean, healthy edge was achieved. The humeral footprint was debrided of soft tissue. Using a combination of a curette and a microfracture pick, a bleeding bed of tissue was achieved in the humeral footprint. Using an Arthrex SpeedBridge, an anterior medial anchor was placed and suture limbs were passed in the anterior one half of the cuff. A second anchor was placed  from the SpeedBridge system in the  posterior portion, and in a similar fashion, all four suture limbs were passed in the posterior half of the tear. Sutures were pulled and the cuff was able to be nicely reduced.   One suture from the anterior anchor and one suture from the posterior anchor were placed in a lateral row SwiveLock. A lateral row anterior hole was made using an awl, and the anchor was deployed. Sutures were tightened. The anchor was depressed.   In a similar fashion, one limb from each suture was placed in a posterior lateral row SwiveLock anchor. The cuff was nicely reduced. The cuff was taken through a range of motion and found to travel as a unit with no gapping.   Please note that a Scorpion Suture Passer was used to pass each limb of the fiber tape loops. Excess suture was cut.   The shoulder was copiously irrigated and portals were closed.   Attention was now turned to the anterior aspect of the shoulder. The pectoralis muscle was palpated and marked. Using a longitudinal incision just lateral to the axilla, incision was made. Sharp dissection was carried down through skin and subcutaneous tissue. Tenotomies were used to incise the underlying fascia. The pectoralis muscle was readily identified. Pectoralis was elevated and biceps muscle was revealed. The arm was abducted and slightly internally rotated to reveal the long head of the biceps muscle.   Blunt dissection was carried down and the cut tendon was removed from the wound. The bicipital groove was palpated and then visualized. Periosteum was removed from a small section of bone. The tendon was cut and a fiber loop was passed through the residual stump of tendon. Fiber loop  was then passed through a PushLock anchor measuring 3.5 mm. A hole was made in the exposed proximal humerus using an awl. The PushLock anchor was inserted and deployed. The tendon tension was approximated with the elbow fully extended.   Attention was found to be appropriate. The wound was  copiously irrigated.   Please note that care was taken when medial retractor was placed. A blunt retractor was placed medially to ensure safety of the musculocutaneous nerve. Subcutaneous tissue was closed using 2-0 Vicryl. Skin was closed using a running Prolene stitch. Sterile dressings were applied. TENS leads were applied. The patient was placed in a sling.   DISPOSITION: The patient will be discharged home the same day of surgery. He will follow up in the office in two days.      ____________________________ Murlean Hark, MD sr:np D: 08/09/2012 15:46:00 ET T: 08/09/2012 20:12:02 ET JOB#: [id number]  cc: Murlean Hark, MD, <Dictator> Murlean Hark MD ELECTRONICALLY SIGNED 09/04/2012 10:21

## 2015-11-21 ENCOUNTER — Emergency Department: Payer: 59

## 2015-11-21 ENCOUNTER — Emergency Department
Admission: EM | Admit: 2015-11-21 | Discharge: 2015-11-21 | Disposition: A | Discharge status: Home / Self Care | Payer: 59 | Attending: Emergency Medicine | Admitting: Emergency Medicine

## 2015-11-21 ENCOUNTER — Encounter: Payer: Self-pay | Admitting: Emergency Medicine

## 2015-11-21 DIAGNOSIS — S8001XA Contusion of right knee, initial encounter: Secondary | ICD-10-CM | POA: Diagnosis not present

## 2015-11-21 DIAGNOSIS — S80211A Abrasion, right knee, initial encounter: Secondary | ICD-10-CM

## 2015-11-21 DIAGNOSIS — Y999 Unspecified external cause status: Secondary | ICD-10-CM | POA: Insufficient documentation

## 2015-11-21 DIAGNOSIS — W228XXA Striking against or struck by other objects, initial encounter: Secondary | ICD-10-CM | POA: Diagnosis not present

## 2015-11-21 DIAGNOSIS — F17210 Nicotine dependence, cigarettes, uncomplicated: Secondary | ICD-10-CM | POA: Diagnosis not present

## 2015-11-21 DIAGNOSIS — Y9339 Activity, other involving climbing, rappelling and jumping off: Secondary | ICD-10-CM | POA: Diagnosis not present

## 2015-11-21 DIAGNOSIS — S8991XA Unspecified injury of right lower leg, initial encounter: Secondary | ICD-10-CM | POA: Diagnosis present

## 2015-11-21 DIAGNOSIS — Y929 Unspecified place or not applicable: Secondary | ICD-10-CM | POA: Insufficient documentation

## 2015-11-21 IMAGING — DX DG KNEE COMPLETE 4+V*R*
4 series · 4 of 4 positions shown · non-contrast
Comparison: [DATE] right knee radiographs.

CLINICAL DATA: Right knee pain after injury.

EXAM:
RIGHT KNEE - COMPLETE 4+ VIEW

[knee lat (1 of 2)]
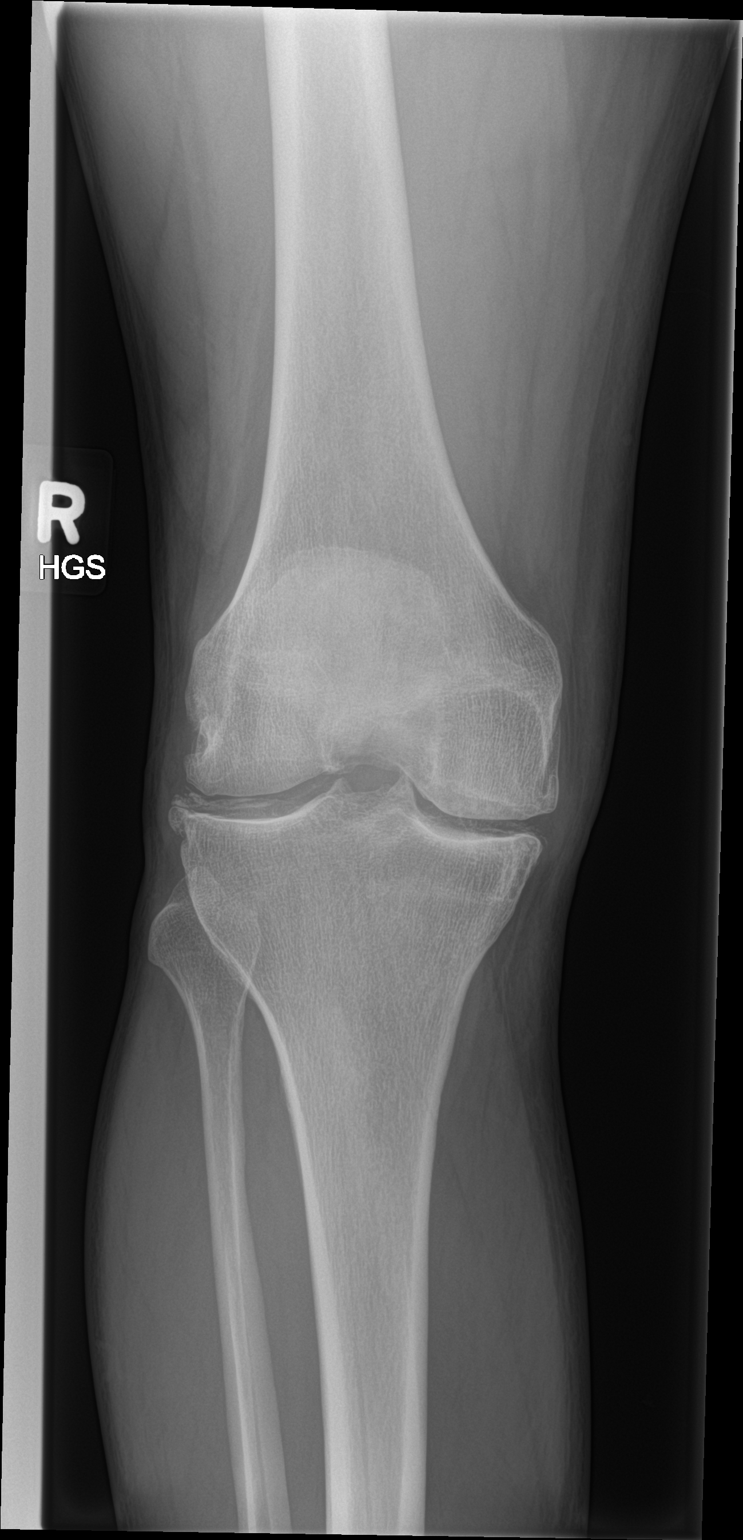

[knee obl (1 of 2)]
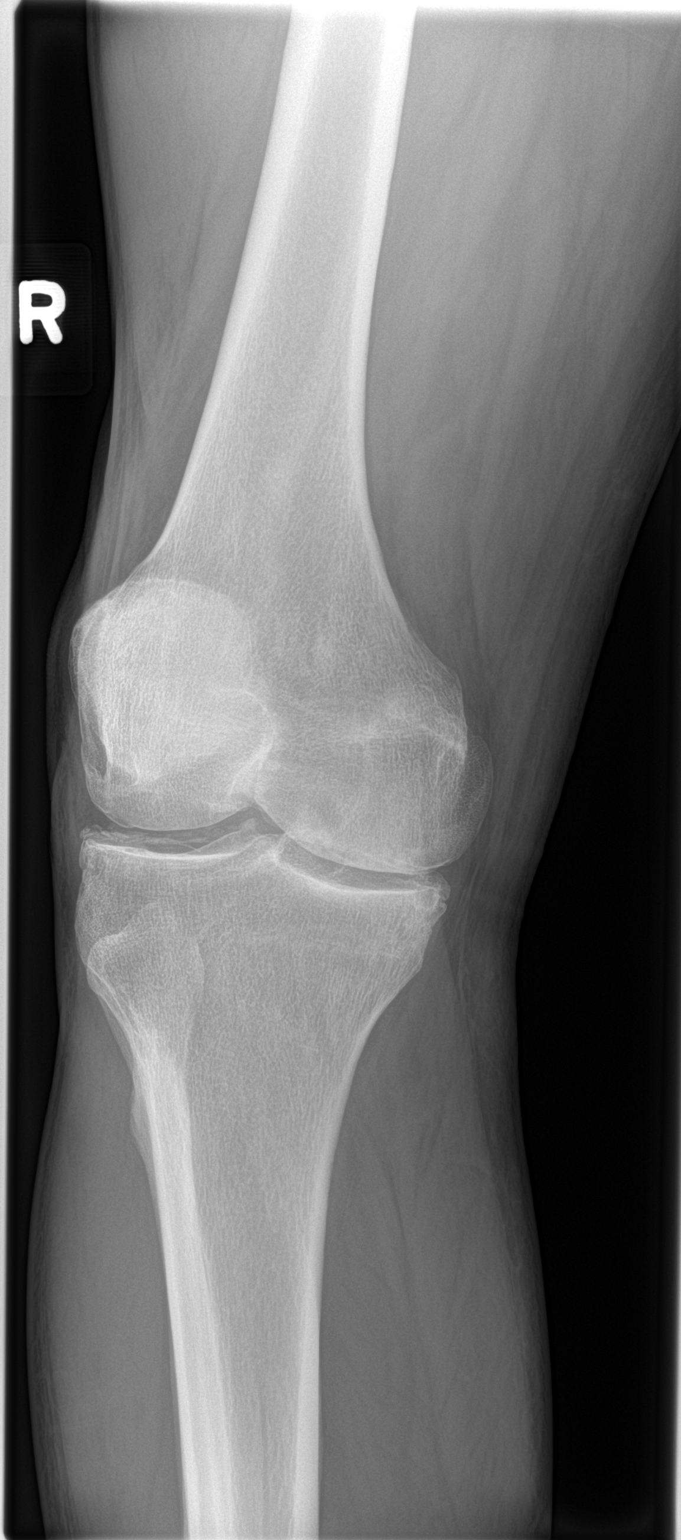

[knee obl (2 of 2)]
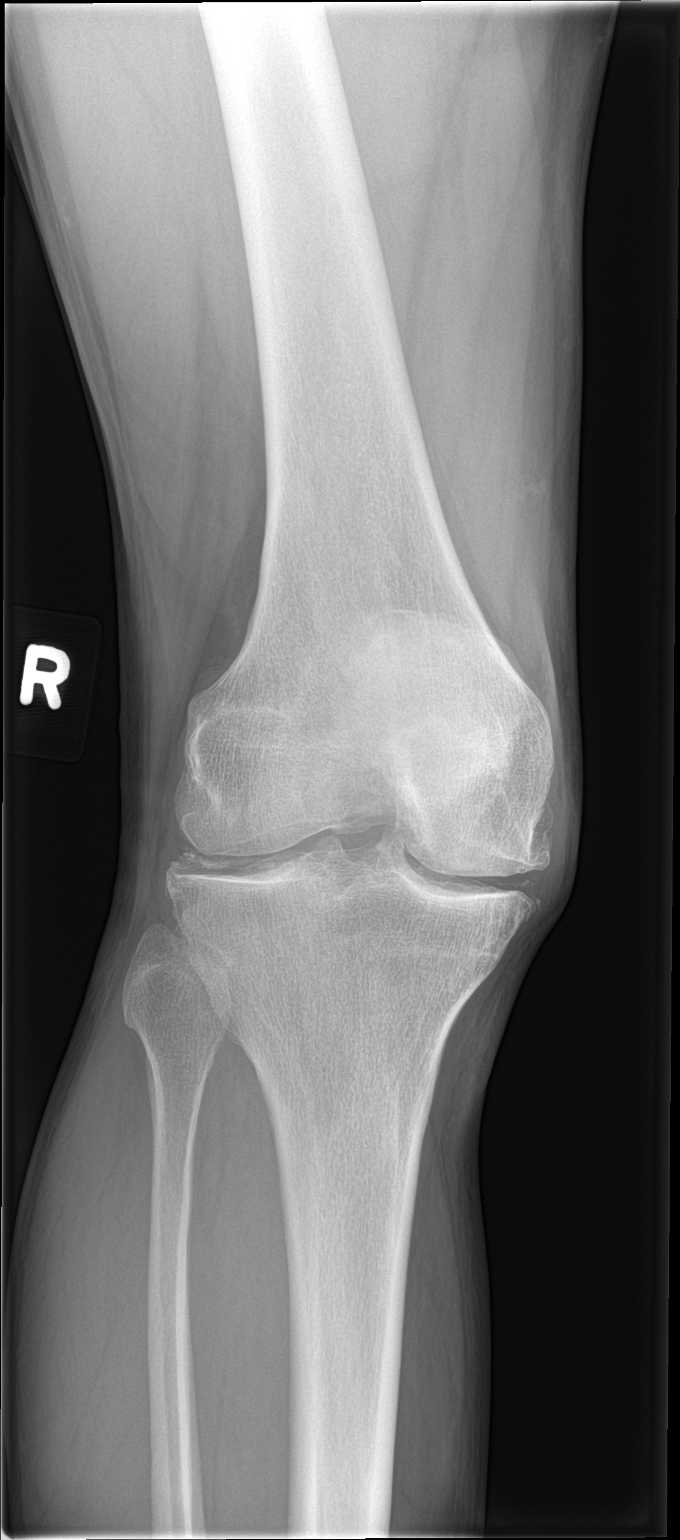

[knee lat (2 of 2)]
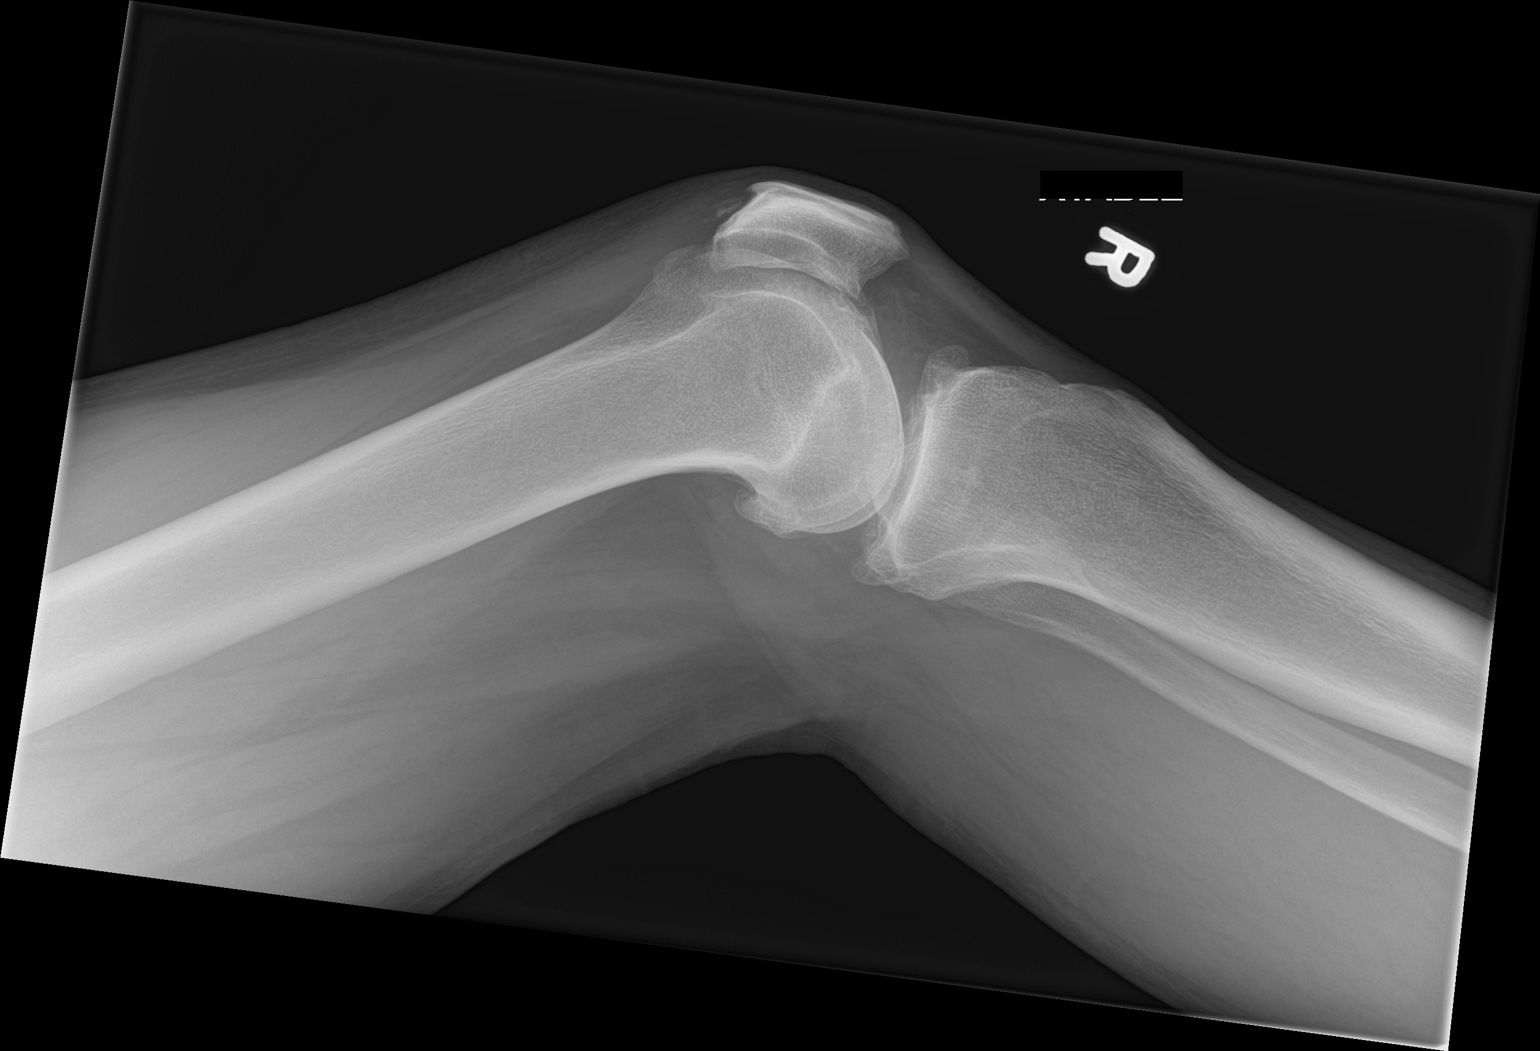

[4 of 4 positions shown; findings below may reference images not displayed]

FINDINGS: No fracture, significant joint effusion, dislocation or suspicious
focal osseous lesion. Meniscal chondrocalcinosis. Moderate
tricompartmental osteoarthritis. Small superior right patellar
enthesophyte. No radiopaque foreign body.
IMPRESSION: 1. No fracture, significant joint effusion or malalignment in the
right knee.
2. Moderate tricompartmental right knee osteoarthritis, probably due
to CPPD arthropathy given the meniscal chondrocalcinosis.

## 2015-11-21 MED ORDER — TRAMADOL HCL 50 MG PO TABS: 50.0000 mg | ORAL_TABLET | Freq: Four times a day (QID) | ORAL | 0 refills | Status: DC | PRN

## 2015-11-21 MED ORDER — KETOROLAC TROMETHAMINE 60 MG/2ML IM SOLN
60.0000 mg | Freq: Once | INTRAMUSCULAR | Status: AC
  Administered 2015-11-21: 60 mg via INTRAMUSCULAR
  Filled 2015-11-21: qty 2

## 2015-11-21 NOTE — ED Notes (Signed)
Pt given blue scrub pants to go home in

## 2015-11-21 NOTE — ED Provider Notes (Signed)
[facility] Emergency Department Provider Note  ___________________________________________   None    (approximate)  I have reviewed the triage vital signs and the nursing notes.   HISTORY  Chief Complaint Knee Injury  HPI Henry Anderson is a 50 y.o. male who was climbing on a truck and hit his right knee against a heavy metal step. Patient is complaining of severe pain to the top of the right knee as well as a small abrasion there. Patient states that he's done this before but it never hurt this bad. He denies any numbness tingling or weakness. Patient states his pain on scale of 0-10 is a 10. Patient has had difficulty walking secondary to the pain.   History reviewed. No pertinent past medical history.  There are no active problems to display for this patient.   Past Surgical History:  Procedure Laterality Date  . FOOT SURGERY Right   . KNEE SURGERY Right   . SHOULDER SURGERY Left     Prior to Admission medications   Medication Sig Start Date End Date Taking? Authorizing Provider  traMADol (ULTRAM) 50 MG tablet Take 1 tablet (50 mg total) by mouth every 6 (six) hours as needed. 11/21/15 11/20/16  Leona CarryLinda M Allesandra Huebsch, MD    Allergies Patient has no known allergies.  No family history on file.  Social History Social History  Substance Use Topics  . Smoking status: Current Every Day Smoker    Packs/day: 2.00    Types: Cigarettes  . Smokeless tobacco: Not on file  . Alcohol use Yes    Review of Systems Constitutional: No fever/chills Eyes: No visual changes. ENT: No sore throat. Cardiovascular: Denies chest pain. Respiratory: Denies shortness of breath. Gastrointestinal: No abdominal pain.  No nausea, no vomiting.  No diarrhea.  No constipation. Genitourinary: Negative for dysuria. Musculoskeletal: Negative for back pain.Positive for right knee injury with contusion and abrasion. Patient with limited range of motion secondary to  pain Skin: Negative for rash. Neurological: Negative for headaches, focal weakness or numbness.  10-point ROS otherwise negative.  ____________________________________________   PHYSICAL EXAM:  VITAL SIGNS: ED Triage Vitals  Enc Vitals Group     BP 11/21/15 1132 (!) 173/92     Pulse Rate 11/21/15 1132 93     Resp 11/21/15 1132 18     Temp 11/21/15 1132 98 F (36.7 C)     Temp Source 11/21/15 1132 Oral     SpO2 11/21/15 1132 98 %     Weight 11/21/15 1134 165 lb (74.8 kg)     Height 11/21/15 1134 5\' 9"  (1.753 m)     Head Circumference --      Peak Flow --      Pain Score 11/21/15 1134 10     Pain Loc --      Pain Edu? --      Excl. in GC? --     Constitutional: Alert and oriented. Well appearing and in no acute distress. Eyes: Conjunctivae are normal. PERRL. EOMI. Head: Atraumatic. Nose: No congestion/rhinnorhea. Mouth/Throat: Mucous membranes are moist.  Oropharynx non-erythematous. Neck: No stridor.   Cardiovascular: Normal rate, regular rhythm. Grossly normal heart sounds.  Good peripheral circulation. Respiratory: Normal respiratory effort.  No retractions. Lungs CTAB. Gastrointestinal: Soft and nontender. No distention. No abdominal bruits. No CVA tenderness. Musculoskeletal: Patient with tenderness but no deformity to the superior patellar area of his right knee with very limited range of motion secondary to pain, but he is distally neurovascular  intact. Patient has a small abrasion to the medial side of the right knee as well. There is no effusion. There is no instability. nor edema.  No joint effusions. Neurologic:  Normal speech and language. No gross focal neurologic deficits are appreciated. No gait instability. Skin:  Skin is warm, dry and intact. No rash noted. Psychiatric: Mood and affect are normal. Speech and behavior are normal.  ____________________________________________   LABS (all labs ordered are listed, but only abnormal results are  displayed)  Labs Reviewed - No data to display ____________________________________________  EKG   ____________________________________________  RADIOLOGY  Dg Knee Complete 4 Views Right  Result Date: 11/21/2015 CLINICAL DATA:  Right knee pain after injury. EXAM: RIGHT KNEE - COMPLETE 4+ VIEW COMPARISON:  04/08/2005 right knee radiographs. FINDINGS: No fracture, significant joint effusion, dislocation or suspicious focal osseous lesion. 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 Small superior right patellar enthesophyte. No radiopaque foreign body. IMPRESSION: 1. No fracture, significant joint effusion or malalignment in the right knee. 2. 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 Electronically Signed   By: Delbert PhenixJason A Poff M.D.   On: 11/21/2015 12:16    ____________________________________________   PROCEDURES  Procedure(s) performed: None  Procedures  Critical Care performed: No  ____________________________________________   INITIAL IMPRESSION / ASSESSMENT AND PLAN / ED COURSE  Pertinent labs & imaging results that were available during my care of the patient were reviewed by me and considered in my medical decision making (see chart for details).  12:41 PM Patient will get x-ray of the right knee as well as Jenel LucksRoberta give him a shot for pain.  Clinical Course as of Nov 21 1239  Sat Nov 21, 2015  1235 Patient will be placed on tramadol to take for his knee pain and he was given a knee immobilizer to use as needed. Patient is to return to the ER condition worsens and otherwise follow up with his primary care M.D. in 1 week.  [LT]    Clinical Course User Index [LT] Leona CarryLinda M Katrianna Friesenhahn, MD     ____________________________________________   FINAL CLINICAL IMPRESSION(S) / ED DIAGNOSES  Final diagnoses:  Contusion of right knee, initial encounter  Abrasion, right  knee, initial encounter      NEW MEDICATIONS STARTED DURING THIS VISIT:  New Prescriptions   TRAMADOL (ULTRAM) 50 MG TABLET    Take 1 tablet (50 mg total) by mouth every 6 (six) hours as needed.     Note:  This document was prepared using Dragon voice recognition software and may include unintentional dictation errors.    Leona CarryLinda M Tykera Skates, MD 11/21/15 [phone]

## 2015-11-21 NOTE — ED Triage Notes (Signed)
Hit knee on steel step of truck 2 days ago, increasingly painful.

## 2015-11-23 ENCOUNTER — Emergency Department
Admission: EM | Admit: 2015-11-23 | Discharge: 2015-11-23 | Disposition: A | Discharge status: Home / Self Care | Payer: 59 | Attending: Emergency Medicine | Admitting: Emergency Medicine

## 2015-11-23 ENCOUNTER — Encounter: Payer: Self-pay | Admitting: *Deleted

## 2015-11-23 DIAGNOSIS — F17210 Nicotine dependence, cigarettes, uncomplicated: Secondary | ICD-10-CM | POA: Insufficient documentation

## 2015-11-23 DIAGNOSIS — S8991XD Unspecified injury of right lower leg, subsequent encounter: Secondary | ICD-10-CM | POA: Diagnosis present

## 2015-11-23 DIAGNOSIS — W228XXD Striking against or struck by other objects, subsequent encounter: Secondary | ICD-10-CM | POA: Insufficient documentation

## 2015-11-23 DIAGNOSIS — M25561 Pain in right knee: Secondary | ICD-10-CM | POA: Diagnosis not present

## 2015-11-23 MED ORDER — CEFTRIAXONE SODIUM 1 G IJ SOLR
1.0000 g | Freq: Once | INTRAMUSCULAR | Status: AC
  Administered 2015-11-23: 1 g via INTRAMUSCULAR
  Filled 2015-11-23: qty 10

## 2015-11-23 MED ORDER — LIDOCAINE HCL (PF) 1 % IJ SOLN
INTRAMUSCULAR | Status: AC
  Filled 2015-11-23: qty 5

## 2015-11-23 MED ORDER — SULFAMETHOXAZOLE-TRIMETHOPRIM 400-80 MG PO TABS: 1.0000 | ORAL_TABLET | Freq: Two times a day (BID) | ORAL | 0 refills | Status: AC

## 2015-11-23 NOTE — ED Triage Notes (Signed)
Patient states he was here two days ago for contusion to right knee. Patient states swelling is worse and pain is worse, especially with movement or weight bearing. Patient has knee immobilizer in place.

## 2015-11-23 NOTE — ED Provider Notes (Signed)
[facility] Emergency Department Provider Note ____________________________________________  Time seen: Approximately 1:55 PM  I have reviewed the triage vital signs and the nursing notes.   HISTORY  Chief Complaint Leg Pain (right knee)  HPI Henry Anderson is a 50 y.o. male presents with right knee pain for 6 days. Patient hit his knee last Tuesday on the steel of a truck. He has a small puncture wound in the lower right portion of his knee. Patient went to the emergency department on Saturday because the pain was so bad and he was having difficulty walking. Patient states that he is still having difficulty walking. Patient also states the swelling is worse. Patient is having some tingling in his feet. No numbness. The patient has been taking tramadol and ibuprofen for pain. Patient has also been elevating her knee, using knee brace, and using ice. Patient's tetanus shot is up-to-date. No recent surgeries or travel. Patient has smoked a pack a day for 40 years.  History reviewed. No pertinent past medical history.  There are no active problems to display for this patient.   Past Surgical History:  Procedure Laterality Date  . FOOT SURGERY Right   . KNEE SURGERY Right   . SHOULDER SURGERY Left     Prior to Admission medications   Medication Sig Start Date End Date Taking? Authorizing Provider  sulfamethoxazole-trimethoprim (BACTRIM) 400-80 MG tablet Take 1 tablet by mouth 2 (two) times daily. 11/23/15 12/03/15  Enid DerryAshley Laderius Valbuena, PA-C  traMADol (ULTRAM) 50 MG tablet Take 1 tablet (50 mg total) by mouth every 6 (six) hours as needed. 11/21/15 11/20/16  Leona CarryLinda M Taylor, MD    Allergies Patient has no known allergies.  No family history on file.  Social History Social History  Substance Use Topics  . Smoking status: Current Every Day Smoker    Packs/day: 2.00    Types: Cigarettes  . Smokeless tobacco: Never Used  . Alcohol use Yes    Review of  Systems Constitutional: No recent illness. No fever/chills. Cardiovascular: Denies chest pain or palpitations. Respiratory: Denies shortness of breath. No cough. Abdominal: No abdominal pain. No nausea, vomiting, diarrhea, constipation.  Musculoskeletal: Limited ROM of right leg. Skin: Negative for rash, wound, lesion. Neurological: Negative for focal weakness or numbness.  ____________________________________________   PHYSICAL EXAM:  VITAL SIGNS: ED Triage Vitals  Enc Vitals Group     BP 11/23/15 1258 (!) 170/88     Pulse Rate 11/23/15 1258 99     Resp 11/23/15 1258 18     Temp 11/23/15 1258 98.1 F (36.7 C)     Temp Source 11/23/15 1258 Oral     SpO2 11/23/15 1258 97 %     Weight 11/23/15 1259 165 lb (74.8 kg)     Height 11/23/15 1259 5\' 9"  (1.753 m)     Head Circumference --      Peak Flow --      Pain Score 11/23/15 1259 9     Pain Loc --      Pain Edu? --      Excl. in GC? --     Constitutional: Alert and oriented. Well appearing and in no acute distress. Eyes: Conjunctivae are normal. EOMI. Head: Atraumatic. Neck: No stridor.  Respiratory: Normal respiratory effort.   Musculoskeletal: Limited ROM of right knee. Decreased strength due to pain of right knee. Full ROM of left knee.  Neurologic:  Normal speech and language. No gross focal neurologic deficits are appreciated. Speech is normal.  No gait instability. Sensation of right leg in tact.  Skin:  Skin is warm, dry and intact. Atraumatic. Erythema over right knee cap. Warm to touch over right knee cap.  Psychiatric: Mood and affect are normal. Speech and behavior are normal.  ____________________________________________   LABS (all labs ordered are listed, but only abnormal results are displayed)  Labs Reviewed - No data to display    INITIAL IMPRESSION / ASSESSMENT AND PLAN / ED COURSE  Clinical Course     Pertinent labs & imaging results that were available during my care of the patient were  reviewed by me and considered in my medical decision making (see chart for details).  My assessment is that the patient has acute bursitis. Additionally, patient could have septic joint, gout, or cellulitis. Because of swelling and erythema over knee cap, I am concerned for potential infection so patient was given a prescription of Bactrim and a shot of Ceftriaxone in Ed. I do not think the joint needs to be aspirated at this time. Patient is afebrile and not tachycardic. Strict instructions were given to the patient to return to the ED if swelling or erythema worsen or fever develops.  ____________________________________________   FINAL CLINICAL IMPRESSION(S) / ED DIAGNOSES  Final diagnoses:  Acute pain of right knee      Enid Derryshley Arrington Yohe, PA-C 11/23/15 1450    Arnaldo NatalPaul F Malinda, MD 11/25/15 0004

## 2015-11-23 NOTE — Discharge Instructions (Addendum)
Return to ED immediately for fever, if swelling and heat over knee increases, or if symptoms worsen.

## 2015-11-23 NOTE — ED Notes (Signed)
See triage note  Right knee pain for a few days   Was seen for same but thinks swelling is worse

## 2015-11-26 ENCOUNTER — Emergency Department
Admission: EM | Admit: 2015-11-26 | Discharge: 2015-11-26 | Disposition: A | Discharge status: Home / Self Care | Payer: 59 | Attending: Emergency Medicine | Admitting: Emergency Medicine

## 2015-11-26 ENCOUNTER — Emergency Department: Payer: 59

## 2015-11-26 ENCOUNTER — Encounter: Payer: Self-pay | Admitting: Emergency Medicine

## 2015-11-26 DIAGNOSIS — S8991XA Unspecified injury of right lower leg, initial encounter: Secondary | ICD-10-CM | POA: Diagnosis present

## 2015-11-26 DIAGNOSIS — Y929 Unspecified place or not applicable: Secondary | ICD-10-CM | POA: Insufficient documentation

## 2015-11-26 DIAGNOSIS — Y990 Civilian activity done for income or pay: Secondary | ICD-10-CM | POA: Diagnosis not present

## 2015-11-26 DIAGNOSIS — F17210 Nicotine dependence, cigarettes, uncomplicated: Secondary | ICD-10-CM | POA: Diagnosis not present

## 2015-11-26 DIAGNOSIS — Y9389 Activity, other specified: Secondary | ICD-10-CM | POA: Insufficient documentation

## 2015-11-26 DIAGNOSIS — M25561 Pain in right knee: Secondary | ICD-10-CM

## 2015-11-26 DIAGNOSIS — W228XXA Striking against or struck by other objects, initial encounter: Secondary | ICD-10-CM | POA: Insufficient documentation

## 2015-11-26 DIAGNOSIS — M25461 Effusion, right knee: Secondary | ICD-10-CM | POA: Diagnosis not present

## 2015-11-26 DIAGNOSIS — R7309 Other abnormal glucose: Secondary | ICD-10-CM | POA: Insufficient documentation

## 2015-11-26 DIAGNOSIS — R739 Hyperglycemia, unspecified: Secondary | ICD-10-CM

## 2015-11-26 HISTORY — DX: Emphysema, unspecified: J43.9

## 2015-11-26 LAB — CBC WITH DIFFERENTIAL/PLATELET
Basophils Absolute: 0.1 10*3/uL (ref 0–0.1)
Basophils Relative: 1 %
EOS ABS: 0.1 10*3/uL (ref 0–0.7)
Eosinophils Relative: 1 %
HCT: 46.4 % (ref 40.0–52.0)
HEMOGLOBIN: 15.8 g/dL (ref 13.0–18.0)
LYMPHS ABS: 2.2 10*3/uL (ref 1.0–3.6)
LYMPHS PCT: 16 %
MCH: 29.7 pg (ref 26.0–34.0)
MCHC: 34.2 g/dL (ref 32.0–36.0)
MCV: 86.9 fL (ref 80.0–100.0)
Monocytes Absolute: 0.8 10*3/uL (ref 0.2–1.0)
Monocytes Relative: 6 %
NEUTROS ABS: 10.5 10*3/uL — AB (ref 1.4–6.5)
NEUTROS PCT: 76 %
Platelets: 203 10*3/uL (ref 150–440)
RBC: 5.34 MIL/uL (ref 4.40–5.90)
RDW: 13.1 % (ref 11.5–14.5)
WBC: 13.7 10*3/uL — AB (ref 3.8–10.6)

## 2015-11-26 LAB — COMPREHENSIVE METABOLIC PANEL
ALT: 17 U/L (ref 17–63)
ANION GAP: 10 (ref 5–15)
AST: 25 U/L (ref 15–41)
Albumin: 3.8 g/dL (ref 3.5–5.0)
Alkaline Phosphatase: 80 U/L (ref 38–126)
BUN: 15 mg/dL (ref 6–20)
CHLORIDE: 99 mmol/L — AB (ref 101–111)
CO2: 24 mmol/L (ref 22–32)
Calcium: 9 mg/dL (ref 8.9–10.3)
Creatinine, Ser: 0.94 mg/dL (ref 0.61–1.24)
Glucose, Bld: 276 mg/dL — ABNORMAL HIGH (ref 65–99)
POTASSIUM: 4.5 mmol/L (ref 3.5–5.1)
Sodium: 133 mmol/L — ABNORMAL LOW (ref 135–145)
Total Bilirubin: 0.7 mg/dL (ref 0.3–1.2)
Total Protein: 6.9 g/dL (ref 6.5–8.1)

## 2015-11-26 IMAGING — CR DG KNEE COMPLETE 4+V*R*
1 series · 4 of 4 positions shown · non-contrast
Comparison: [DATE]

CLINICAL DATA: Knee pain, swelling

EXAM:
RIGHT KNEE - COMPLETE 4+ VIEW

[Series 1: dg knee complete 4 views right · 0.14mm/px · 4 of 4 slices shown]
[im 1/4]
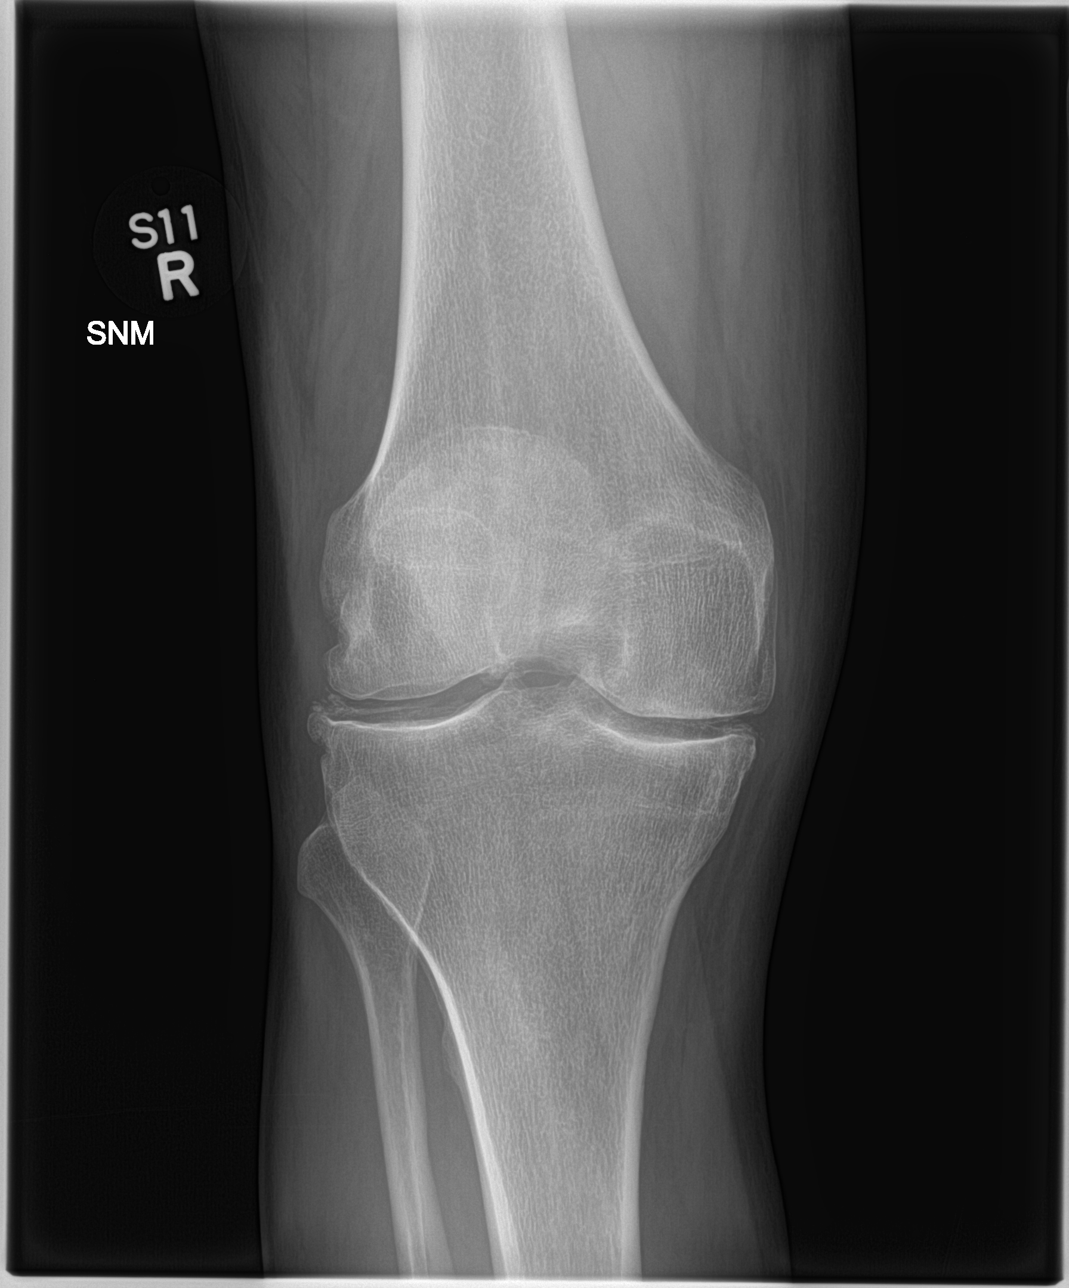
[im 2/4]
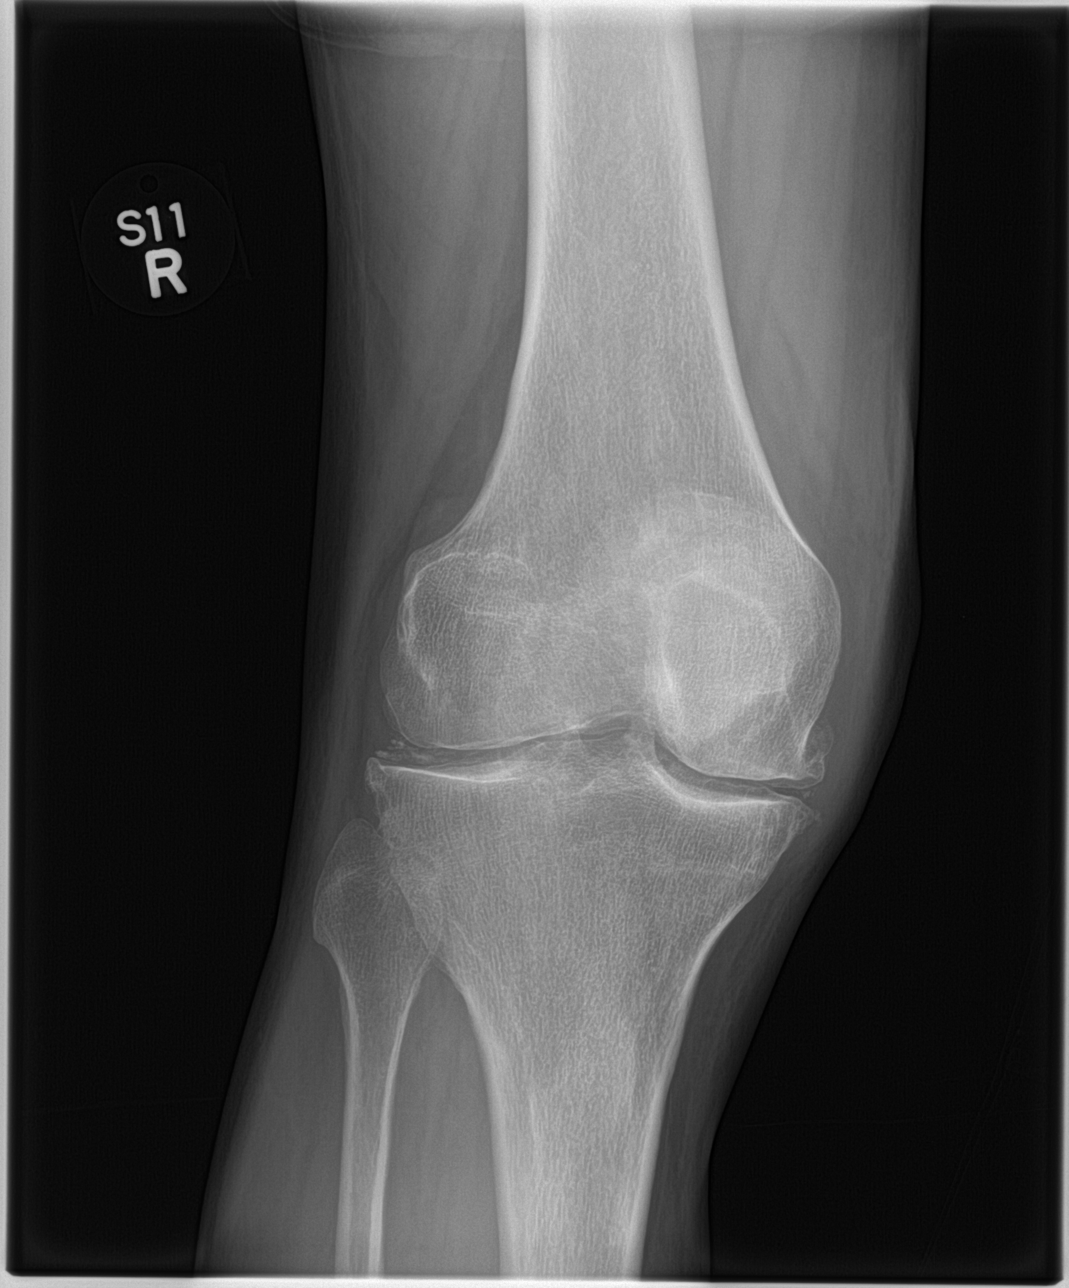
[im 3/4]
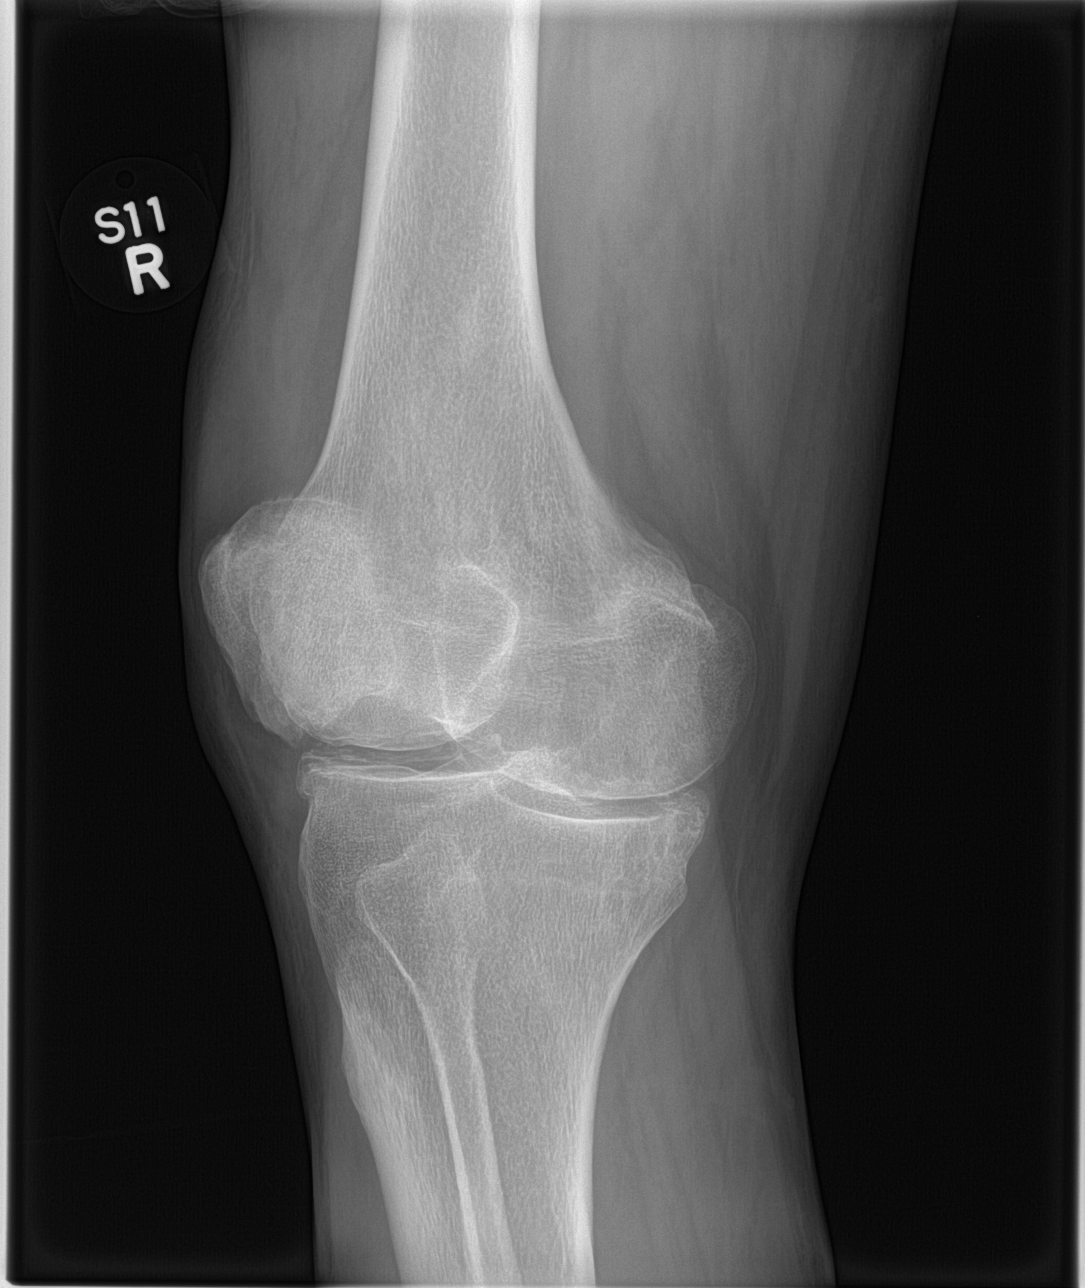
[im 4/4]
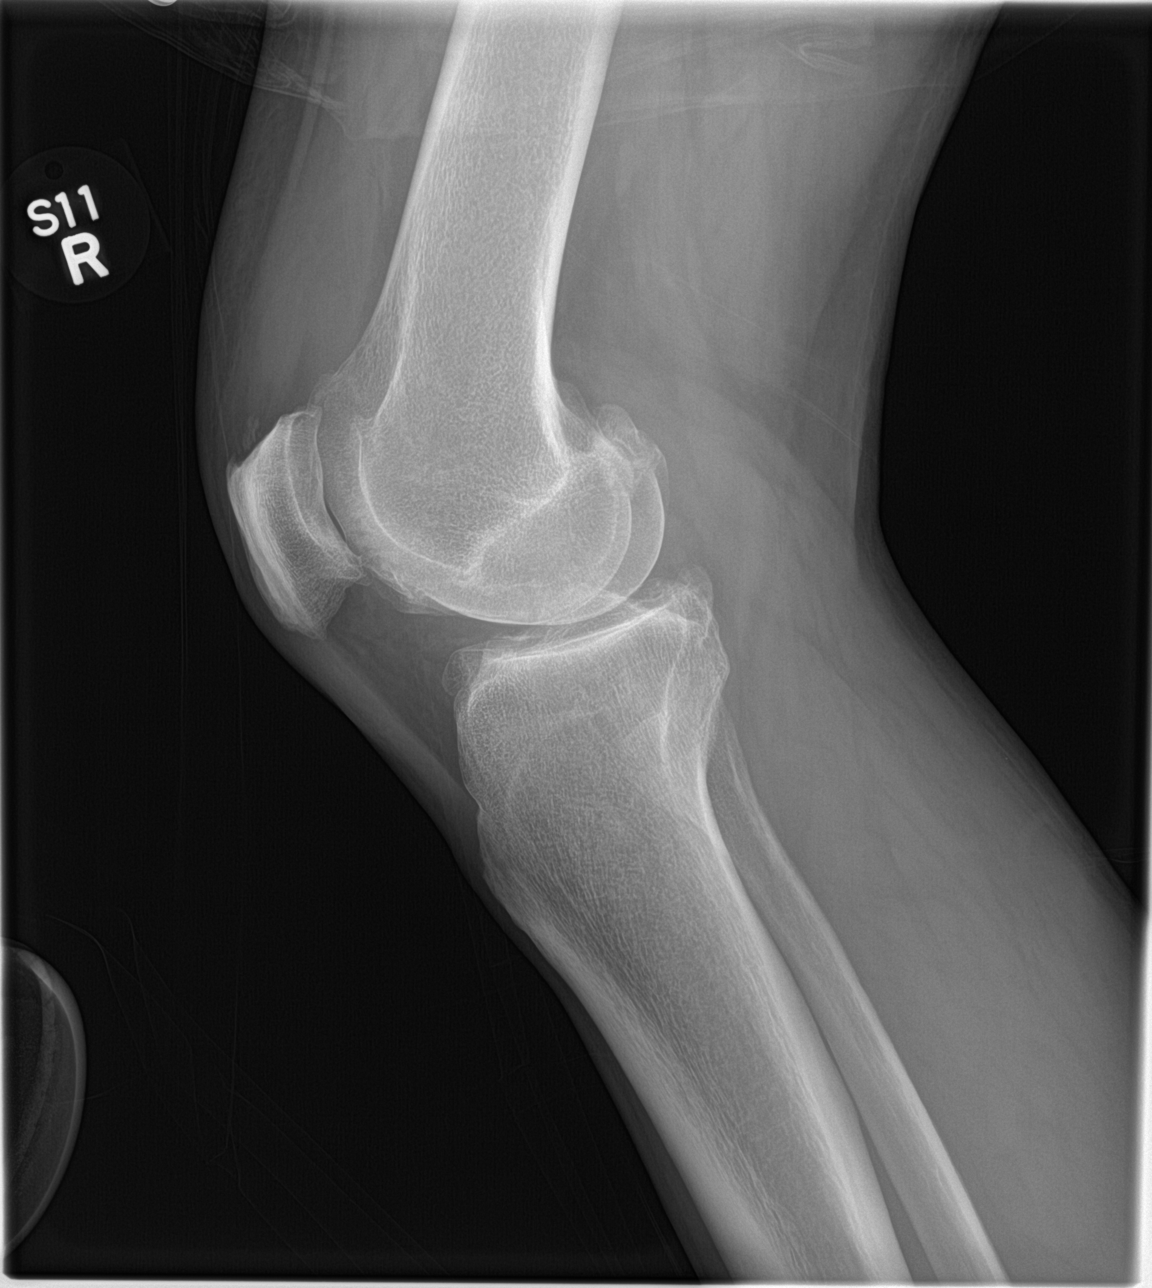

[4 of 4 positions shown; findings below may reference images not displayed]

FINDINGS: There is joint space narrowing of the medial lateral compartmen with
osteophytosis t. There is calcification within the joint space of
the medial lateral compartment. Small joint effusion. No acute
findings.
IMPRESSION: 1. Osteoarthritis of the medial and lateral compartment.
2. Chondrocalcinosis of the medial and lateral compartment.
3. Small joint effusion.

## 2015-11-26 MED ORDER — TRAMADOL HCL 50 MG PO TABS: 50.0000 mg | ORAL_TABLET | Freq: Four times a day (QID) | ORAL | 0 refills | Status: DC | PRN

## 2015-11-26 NOTE — ED Provider Notes (Signed)
[facility] Emergency Department Provider Note  ____________________________________________  Time seen: Approximately 1:46 PM  I have reviewed the triage vital signs and the nursing notes.   HISTORY  Chief Complaint Leg Pain    HPI Henry Anderson is a 50 y.o. male , NAD, presents to the department with continued pain, swelling and redness to his right knee. States he injured his knee while at work approximately one week ago. Was seen in this emergency department several days ago and was placed on antibiotics. He was advised that if he was not improving to return to this emergency further evaluation. Patient states that his work note ran out yesterday and he's still not able to fully bear weight without pain. He notes that he still has some redness and pain about the right knee and was concerned about fluid on the knee. Denies any fevers, chills, body aches. Has had no chest pain, shortness of breath, lower leg edema or abnormal warmth. Has had no abdominal pain, nausea or vomiting. Has been taking his medications as prescribed.   Past Medical History:  Diagnosis Date  . Emphysema of lung (HCC)     There are no active problems to display for this patient.   Past Surgical History:  Procedure Laterality Date  . FOOT SURGERY Right   . KNEE SURGERY Right   . SHOULDER SURGERY Left     Prior to Admission medications   Medication Sig Start Date End Date Taking? Authorizing Provider  sulfamethoxazole-trimethoprim (BACTRIM) 400-80 MG tablet Take 1 tablet by mouth 2 (two) times daily. 11/23/15 12/03/15  Enid Derry, PA-C  traMADol (ULTRAM) 50 MG tablet Take 1 tablet (50 mg total) by mouth every 6 (six) hours as needed. 11/21/15 11/20/16  Leona Carry, MD    Allergies Patient has no known allergies.  No family history on file.  Social History Social History  Substance Use Topics  . Smoking status: Current Every Day Smoker    Packs/day: 2.00    Types:  Cigarettes  . Smokeless tobacco: Never Used  . Alcohol use Yes     Review of Systems  Constitutional: No fever/chills Cardiovascular: No chest pain. Respiratory: No shortness of breath. No wheezing.  Gastrointestinal: No abdominal pain.  No nausea, vomiting. Musculoskeletal: Positive right knee pain. Negative right lower leg, ankle or foot pain.  Skin: Positive redness, swelling right knee. Negative for rash, skin sores, abnormal warmth, edema of the lower extremity. Neurological: Negative for numbness, weakness, tingling. 10-point ROS otherwise negative.  ____________________________________________   PHYSICAL EXAM:  VITAL SIGNS: ED Triage Vitals  Enc Vitals Group     BP 11/26/15 1127 (!) 174/94     Pulse Rate 11/26/15 1127 91     Resp 11/26/15 1127 18     Temp 11/26/15 1127 98 F (36.7 C)     Temp Source 11/26/15 1127 Oral     SpO2 11/26/15 1127 97 %     Weight 11/26/15 1128 165 lb (74.8 kg)     Height 11/26/15 1128 5\' 8"  (1.727 m)     Head Circumference --      Peak Flow --      Pain Score 11/26/15 1128 9     Pain Loc --      Pain Edu? --      Excl. in GC? --     Constitutional: Alert and oriented. Well appearing and in no acute distress. Eyes: Conjunctivae are normal.  Head: Atraumatic. Cardiovascular:  Good peripheral circulation  with 2+ pulses noted in the right lower extremity . Respiratory: Normal respiratory effort without tachypnea or retractions. Musculoskeletal:  Tenderness to palpation about the lateral right knee. Mild effusion is palpated and the patella is easily movable. No laxity with anterior or posterior drawer of the right knee. No laxity with varus or valgus stress of the right knee. No masses to palpation about the posterior knee. No lower extremity tenderness nor edema.   Neurologic:  Normal speech and language. No gross focal neurologic deficits are appreciated.  Skin:  Trace redness is noted about the superior portion of the right knee without  evidence of skin sores, fluctuance or induration. Skin is warm, dry and intact. No rash noted. Psychiatric: Mood and affect are normal. Speech and behavior are normal. Patient exhibits appropriate insight and judgement.   ____________________________________________   LABS (all labs ordered are listed, but only abnormal results are displayed)  Labs Reviewed  COMPREHENSIVE METABOLIC PANEL - Abnormal; Notable for the following:       Result Value   Sodium 133 (*)    Chloride 99 (*)    Glucose, Bld 276 (*)    All other components within normal limits  CBC WITH DIFFERENTIAL/PLATELET - Abnormal; Notable for the following:    WBC 13.7 (*)    Neutro Abs 10.5 (*)    All other components within normal limits   ____________________________________________  EKG  None ____________________________________________  RADIOLOGY I, Ernestene KielJami L Hagler, personally viewed and evaluated these images (plain radiographs) as part of my medical decision making, as well as reviewing the written report by the radiologist.  Dg Knee Complete 4 Views Right  Result Date: 11/26/2015 CLINICAL DATA:  Knee pain, swelling EXAM: RIGHT KNEE - COMPLETE 4+ VIEW COMPARISON:  11/21/2015 FINDINGS: There is joint space narrowing of the medial lateral compartmen with osteophytosis t. There is calcification within the joint space of the medial lateral compartment. Small joint effusion. No acute findings. IMPRESSION: 1. Osteoarthritis of the medial and lateral compartment. 2. Chondrocalcinosis of the medial and lateral compartment. 3. Small joint effusion. Electronically Signed   By: Genevive BiStewart  Edmunds M.D.   On: 11/26/2015 12:25    ____________________________________________    PROCEDURES  Procedure(s) performed: None   Procedures   Medications - No data to display   ____________________________________________   INITIAL IMPRESSION / ASSESSMENT AND PLAN / ED COURSE  Pertinent labs & imaging results that were  available during my care of the patient were reviewed by me and considered in my medical decision making (see chart for details).  Clinical Course as of Nov 25 1824  Thu Nov 26, 2015  1350 Laboratory results and imaging results were discussed with the patient and his wife who is at the bedside. Discussed elevated blood glucose of 276. Patient states that he ate biscuits and gravy prior to coming to the emergency department. He does have family history of diabetes but no personal history of such. He related that he has an appointment in January to establish care with [facility]. I have encouraged them to call that practice and see if he could be worked in for an acute visit to reassess glucose. Patient will continue current oral antibiotics and will follow up with orthopedics for further evaluation and treatment of knee effusion. No evidence of septic joint and physical exam does not support diagnosis of DVT. Patient does note that his knee pain and swelling is overall improved over the last couple of days. Does note that his work  note and did yesterday and he will need another work note for today.  [JH]    Clinical Course User Index [JH] Jami L Hagler, PA-C    Patient's diagnosis is consistent with Effusion of the right knee and acute pain of the right knee. Patient will be discharged home with prescriptions for Ultram to take as needed sparingly for pain. Patient is to continue antibiotics as previously prescribed. Patient is to follow up with Dr. Martha ClanKrasinski in orthopedics in 48 hours for further evaluation and treatment.  Patient is given ED precautions to return to the ED for any worsening or new symptoms.    ____________________________________________  FINAL CLINICAL IMPRESSION(S) / ED DIAGNOSES  Final diagnoses:  Effusion of right knee  Acute pain of right knee      NEW MEDICATIONS STARTED DURING THIS VISIT:  New Prescriptions   No medications on file         Hope PigeonJami L  Hagler, PA-C 11/26/15 1832    Jene Everyobert Kinner, MD 11/27/15 1615

## 2015-11-26 NOTE — ED Triage Notes (Signed)
Patient presents to the ED with pain, swelling and redness to his right leg x 1.5 weeks.  Patient states he hit his leg on a steel step and it was very painful and then redness and swelling developed which patient was treated for in the ED several days ago with antibiotics.  Patient states, "I've been taking the antibiotics but it's getting worse and not better."

## 2016-08-03 ENCOUNTER — Encounter: Payer: Self-pay | Admitting: *Deleted

## 2016-08-03 ENCOUNTER — Encounter: Payer: 59 | Attending: Family Medicine | Admitting: *Deleted

## 2016-08-03 VITALS — BP 128/72 | Ht 69.0 in | Wt 181.0 lb

## 2016-08-03 DIAGNOSIS — Z713 Dietary counseling and surveillance: Secondary | ICD-10-CM | POA: Diagnosis not present

## 2016-08-03 DIAGNOSIS — E119 Type 2 diabetes mellitus without complications: Secondary | ICD-10-CM | POA: Diagnosis not present

## 2016-08-03 NOTE — Progress Notes (Signed)
Diabetes Self-Management Education  Visit Type: First/Initial  Appt. Start Time: 0825 Appt. End Time: [id number]  08/03/2016  Mr. Henry Anderson, identified by name and date of birth, is a 51 y.o. male with a diagnosis of Diabetes: Type 2.   ASSESSMENT  Blood pressure 128/72, height 5\' 9"  (1.753 m), weight 181 lb (82.1 kg). Body mass index is 26.73 kg/m.      Diabetes Self-Management Education - 08/03/16 0934      Visit Information   Visit Type First/Initial     Initial Visit   Diabetes Type Type 2   Are you currently following a meal plan? Yes   What type of meal plan do you follow? "trying to watch what I eat" - not drinking sugary beverages   Are you taking your medications as prescribed? Yes   Date Diagnosed 3 months ago     Health Coping   How would you rate your overall health? Fair     Psychosocial Assessment   Patient Belief/Attitude about Diabetes Motivated to manage diabetes  "a little stressed"   Self-care barriers None   Self-management support Doctor's office;Family   Other persons present Spouse/SO   Patient Concerns Nutrition/Meal planning;Glycemic Control;Problem Solving;Healthy Lifestyle;Monitoring;Medication   Special Needs None   Preferred Learning Style Auditory;Visual;Hands on   Learning Readiness Change in progress   How often do you need to have someone help you when you read instructions, pamphlets, or other written materials from your doctor or pharmacy? 1 - Never   What is the last grade level you completed in school? GED     Pre-Education Assessment   Patient understands the diabetes disease and treatment process. Needs Instruction   Patient understands incorporating nutritional management into lifestyle. Needs Instruction   Patient undertands incorporating physical activity into lifestyle. Needs Instruction   Patient understands using medications safely. Needs Instruction   Patient understands monitoring blood glucose, interpreting and using  results Needs Review   Patient understands prevention, detection, and treatment of acute complications. Needs Instruction   Patient understands prevention, detection, and treatment of chronic complications. Needs Instruction   Patient understands how to develop strategies to address psychosocial issues. Needs Instruction   Patient understands how to develop strategies to promote health/change behavior. Needs Instruction     Complications   How often do you check your blood sugar? 1-2 times/day   Fasting Blood glucose range (mg/dL) 308-657;846-962;>952130-179;180-200;>200  FBG's 138-229 mg/dL.   Postprandial Blood glucose range (mg/dL) --  Random blood sugars in the evening range from 109-214 mg/dL.   Have you had a dilated eye exam in the past 12 months? No   Have you had a dental exam in the past 12 months? Yes   Are you checking your feet? No     Dietary Intake   Breakfast **only eats 1 meal per day and 0-1 snack/day**   Dinner chicken or pork or beef with salads, potatoes, corn, peas, green beans, onions, peppers   Beverage(s) water, coffee, diet soda     Exercise   Exercise Type ADL's     Patient Education   Previous Diabetes Education No   Disease state  Definition of diabetes, type 1 and 2, and the diagnosis of diabetes   Nutrition management  Role of diet in the treatment of diabetes and the relationship between the three main macronutrients and blood glucose level;Reviewed blood glucose goals for pre and post meals and how to evaluate the patients' food intake on their blood glucose level.;Effects of  alcohol on blood glucose and safety factors with consumption of alcohol.   Physical activity and exercise  Role of exercise on diabetes management, blood pressure control and cardiac health.   Medications Reviewed patients medication for diabetes, action, purpose, timing of dose and side effects.   Monitoring Purpose and frequency of SMBG.;Taught/discussed recording of test results and interpretation  of SMBG.;Identified appropriate SMBG and/or A1C goals.;Yearly dilated eye exam   Chronic complications Relationship between chronic complications and blood glucose control   Psychosocial adjustment Identified and addressed patients feelings and concerns about diabetes   Personal strategies to promote health Review risk of smoking and offered smoking cessation     Individualized Goals (developed by patient)   Reducing Risk Improve blood sugars Decrease medications Prevent diabetes complications Lead a healthier lifestyle Become more fit Eat better     Outcomes   Expected Outcomes Demonstrated interest in learning. Expect positive outcomes   Future DMSE 2 wks      Individualized Plan for Diabetes Self-Management Training:   Learning Objective:  Patient will have a greater understanding of diabetes self-management. Patient education plan is to attend individual and/or group sessions per assessed needs and concerns.   Plan:   Patient Instructions  Check blood sugars 2 x day before breakfast and 2 hrs after supper every day Bring blood sugar records to the next class Exercise:  Begin walking for  15  minutes   3 days a week and gradually increase to 30 minutes 5 x week Eat 3 meals day,  1-2 snacks a day Space meals 4-6 hours apart Don't skip meals Continue to decrease smoking Make an eye doctor appointment  Expected Outcomes:  Demonstrated interest in learning. Expect positive outcomes  Education material provided:  General Meal Planning Guidelines Simple Meal Plan  If problems or questions, patient to contact team via:  Sharion SettlerSheila Nas Wafer, RN, CCM, CDE [phone]  Future DSME appointment: 2 wks  August 18, 2016 for Diabetes Class 1

## 2016-08-03 NOTE — Patient Instructions (Signed)
Check blood sugars 2 x day before breakfast and 2 hrs after supper every day Bring blood sugar records to the next class  Exercise:  Begin walking for  15  minutes   3 days a week and gradually increase to 30 minutes 5 x week  Eat 3 meals day,  1-2 snacks a day Space meals 4-6 hours apart Don't skip meals  Continue to decrease smoking  Make an eye doctor appointment  Return for classes on:

## 2016-08-18 ENCOUNTER — Ambulatory Visit: Payer: 59

## 2016-08-24 ENCOUNTER — Telehealth: Payer: Self-pay | Admitting: Dietician

## 2016-08-24 NOTE — Telephone Encounter (Signed)
Called patient to reschedule class series, as he missed class 1 on 08/18/16. His listed number is not working at this time.

## 2016-08-25 ENCOUNTER — Encounter: Payer: Self-pay | Admitting: *Deleted

## 2016-08-25 NOTE — Progress Notes (Signed)
Mailed letter to patient regarding missed Diabetes classes to see if he wants to reschedule. His number has been disconnected.

## 2016-09-22 ENCOUNTER — Ambulatory Visit: Payer: 59

## 2016-09-26 ENCOUNTER — Encounter: Payer: Self-pay | Admitting: *Deleted

## 2016-10-27 ENCOUNTER — Ambulatory Visit: Payer: 59

## 2017-06-13 ENCOUNTER — Encounter: Payer: Self-pay | Admitting: Emergency Medicine

## 2017-06-13 ENCOUNTER — Emergency Department
Admission: EM | Admit: 2017-06-13 | Discharge: 2017-06-13 | Disposition: A | Discharge status: Home / Self Care | Payer: 59 | Attending: Emergency Medicine | Admitting: Emergency Medicine

## 2017-06-13 ENCOUNTER — Other Ambulatory Visit: Payer: Self-pay

## 2017-06-13 DIAGNOSIS — F17210 Nicotine dependence, cigarettes, uncomplicated: Secondary | ICD-10-CM | POA: Insufficient documentation

## 2017-06-13 DIAGNOSIS — M5431 Sciatica, right side: Secondary | ICD-10-CM | POA: Insufficient documentation

## 2017-06-13 DIAGNOSIS — Z7984 Long term (current) use of oral hypoglycemic drugs: Secondary | ICD-10-CM | POA: Insufficient documentation

## 2017-06-13 DIAGNOSIS — M79604 Pain in right leg: Secondary | ICD-10-CM | POA: Insufficient documentation

## 2017-06-13 DIAGNOSIS — Z79899 Other long term (current) drug therapy: Secondary | ICD-10-CM | POA: Diagnosis not present

## 2017-06-13 DIAGNOSIS — Z7982 Long term (current) use of aspirin: Secondary | ICD-10-CM | POA: Insufficient documentation

## 2017-06-13 DIAGNOSIS — E119 Type 2 diabetes mellitus without complications: Secondary | ICD-10-CM | POA: Diagnosis not present

## 2017-06-13 DIAGNOSIS — M25551 Pain in right hip: Secondary | ICD-10-CM | POA: Insufficient documentation

## 2017-06-13 DIAGNOSIS — I10 Essential (primary) hypertension: Secondary | ICD-10-CM | POA: Insufficient documentation

## 2017-06-13 DIAGNOSIS — M545 Low back pain: Secondary | ICD-10-CM | POA: Diagnosis present

## 2017-06-13 MED ORDER — KETOROLAC TROMETHAMINE 30 MG/ML IJ SOLN
30.0000 mg | Freq: Once | INTRAMUSCULAR | Status: AC
  Administered 2017-06-13: 30 mg via INTRAMUSCULAR
  Filled 2017-06-13: qty 1

## 2017-06-13 MED ORDER — MELOXICAM 15 MG PO TABS: 15.0000 mg | ORAL_TABLET | Freq: Every day | ORAL | 0 refills | Status: DC

## 2017-06-13 MED ORDER — METHOCARBAMOL 500 MG PO TABS: 500.0000 mg | ORAL_TABLET | Freq: Four times a day (QID) | ORAL | 0 refills | Status: DC

## 2017-06-13 MED ORDER — ORPHENADRINE CITRATE 30 MG/ML IJ SOLN
60.0000 mg | Freq: Once | INTRAMUSCULAR | Status: AC
  Administered 2017-06-13: 60 mg via INTRAMUSCULAR
  Filled 2017-06-13: qty 2

## 2017-06-13 NOTE — ED Triage Notes (Signed)
Pt arrived to the ED accompanied by her husband for complaints of right hip pain, back pain and right leg pain. Pt states that he was sitting watching TV when the pain started. Pt denies any back pain problems in the past or any injury. Pt is AOx4 in mild pain during triage.

## 2017-06-13 NOTE — ED Notes (Signed)
Patient left after med hold with wife to drive home.

## 2017-06-13 NOTE — ED Provider Notes (Signed)
[facility] Emergency Department Provider Note  ____________________________________________  Time seen: Approximately 10:49 PM  I have reviewed the triage vital signs and the nursing notes.   HISTORY  Chief Complaint Hip Pain; Back Pain; and Leg Pain    HPI Henry Anderson is a 52 y.o. male who presents the emergency department complaining of right lower back, hip pain.  Patient reports that over the past several days he is noticed some intermittent muscle cramps, specifically in the right lower extremity.  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  He does sit for long periods of time.  Patient denies any edema or erythema to the lower extremities.  Patient reports tonight he was at home, attempted to stand and felt a sharp, shooting burning sensation radiating from his back into his buttocks.  Patient denies any trauma to the area.  No urinary symptoms.  Patient does not take any medication for this complaint.  No other complaints prior to arrival.  Patient has a history of diabetes, hypertension.  No complaints with chronic baseline medical problems.  Past Medical History:  Diagnosis Date  . Diabetes mellitus without complication (HCC)   . Emphysema of lung (HCC)   . Hypertension     There are no active problems to display for this patient.   Past Surgical History:  Procedure Laterality Date  . FOOT SURGERY Right   . KNEE SURGERY Right   . SHOULDER SURGERY Left     Prior to Admission medications   Medication Sig Start Date End Date Taking? Authorizing Provider  aspirin EC 81 MG tablet Take 81 mg by mouth daily.    [provider]  lisinopril-hydrochlorothiazide (PRINZIDE,ZESTORETIC) 20-25 MG tablet Take 1 tablet by mouth daily.    [provider]  meloxicam (MOBIC) 15 MG tablet Take 1 tablet (15 mg total) by mouth daily. 06/13/17   Afifa Truax, Delorise Royals, PA-C  metFORMIN (GLUCOPHAGE)  1000 MG tablet Take 1,000 mg by mouth 2 (two) times daily with a meal.    [provider]  methocarbamol (ROBAXIN) 500 MG tablet Take 1 tablet (500 mg total) by mouth 4 (four) times daily. 06/13/17   Zyrion Coey, Delorise Royals, PA-C    Allergies Patient has no known allergies.  Family History  Problem Relation Age of Onset  . Diabetes Father   . Diabetes Sister   . Diabetes Sister     Social History Social History   Tobacco Use  . Smoking status: Current Every Day Smoker    Packs/day: 1.50    Years: 30.00    Pack years: 45.00    Types: Cigarettes  . Smokeless tobacco: Former Neurosurgeon    Types: Chew  Substance Use Topics  . Alcohol use: Yes    Alcohol/week: 0.6 - 1.2 oz    Types: 1 - 2 Cans of beer per week  . Drug use: No     Review of Systems  Constitutional: No fever/chills Eyes: No visual changes.  Cardiovascular: no chest pain. Respiratory: no cough. No SOB. Gastrointestinal: No abdominal pain.  No nausea, no vomiting.  No diarrhea.  No constipation. Genitourinary: Negative for dysuria. No hematuria Musculoskeletal: Positive for right lower back pain radiating into the buttocks and hip region. Skin: Negative for rash, abrasions, lacerations, ecchymosis. Neurological: Negative for headaches, focal weakness or numbness. 10-point ROS otherwise negative.  ____________________________________________   PHYSICAL EXAM:  VITAL SIGNS: ED Triage Vitals  Enc Vitals Group  BP 06/13/17 2140 (!) 160/100     Pulse Rate 06/13/17 2140 91     Resp 06/13/17 2140 20     Temp 06/13/17 2140 97.7 F (36.5 C)     Temp Source 06/13/17 2140 Oral     SpO2 06/13/17 2140 97 %     Weight 06/13/17 2141 152 lb (68.9 kg)     Height 06/13/17 2141 5\' 9"  (1.753 m)     Head Circumference --      Peak Flow --      Pain Score 06/13/17 2141 10     Pain Loc --      Pain Edu? --      Excl. in GC? --      Constitutional: Alert and oriented. Well appearing and in no acute  distress. Eyes: Conjunctivae are normal. PERRL. EOMI. Head: Atraumatic. ENT:      Ears:       Nose: No congestion/rhinnorhea.      Mouth/Throat: Mucous membranes are moist.  Neck: No stridor.    Cardiovascular: Normal rate, regular rhythm. Normal S1 and S2.  Good peripheral circulation. Respiratory: Normal respiratory effort without tachypnea or retractions. Lungs CTAB. Good air entry to the bases with no decreased or absent breath sounds. Gastrointestinal: Bowel sounds 4 quadrants. Soft and nontender to palpation. No guarding or rigidity. No palpable masses. No distention. No CVA tenderness. Musculoskeletal: Full range of motion to all extremities. No gross deformities appreciated.  Patient is nontender to palpation midline lumbar spine.  No visible abnormality.  Full range of motion to the lumbar spine.  Patient is extremely tender to palpation over the sciatic notch right side.  Negative straight leg raise bilaterally.  Dorsalis pedis pulse intact bilateral lower extremities.  Sensation intact and equal bilateral lower extremities.  No pedal edema.  No unilateral extremity edema.  No appreciable spasms on palpation. Neurologic:  Normal speech and language. No gross focal neurologic deficits are appreciated.  Skin:  Skin is warm, dry and intact. No rash noted. Psychiatric: Mood and affect are normal. Speech and behavior are normal. Patient exhibits appropriate insight and judgement.   ____________________________________________   LABS (all labs ordered are listed, but only abnormal results are displayed)  Labs Reviewed - No data to display ____________________________________________  EKG   ____________________________________________  RADIOLOGY   No results found.  ____________________________________________    PROCEDURES  Procedure(s) performed:    Procedures    Medications  ketorolac (TORADOL) 30 MG/ML injection 30 mg (has no administration in time range)   orphenadrine (NORFLEX) injection 60 mg (has no administration in time range)     ____________________________________________   INITIAL IMPRESSION / ASSESSMENT AND PLAN / ED COURSE  Pertinent labs & imaging results that were available during my care of the patient were reviewed by me and considered in my medical decision making (see chart for details).  Review of the [place] CSRS was performed in accordance of the [facility] prior to dispensing any controlled drugs.     Patient's diagnosis is consistent with sciatica.  Patient presents with sharp, burning pain radiating from the back into the hip region.  Differential included compression fracture, paraspinal muscle spasm, sciatica, muscle cramps.  Patient has had a few intermittent cramps over the past several days.  Patient reports significant caffeine intake with not as much non-caffeinated oral hydration, patient also spends prolonged periods of time in the heat..  At this time, differential could also include electrolyte imbalance, hypokalemia.  I suspect that patient has  very mild electrolyte imbalances consistent with mild dehydration due to time in the heat, activity, increased sweating due to the weather, inadequate non-caffeinated oral hydration.  Mucous membranes are moist.  At this time, patient's exam is reassuring.  Patient is able to maintain oral intake of liquids.  I have advised patient to drink Gatorade/electrolyte enhanced water to ensure no electrolyte imbalance.  I did offer labs and imaging.  After further discussion, the patient does not feel labs or imaging are warranted which I concur with.  No significant indication for labs or imaging at this time.. Patient will be discharged home with prescriptions for meloxicam and Robaxin for symptom control. Patient is to follow up with primary care as needed or otherwise directed. Patient is given ED precautions to return to the ED for any worsening or new  symptoms.     ____________________________________________  FINAL CLINICAL IMPRESSION(S) / ED DIAGNOSES  Final diagnoses:  Sciatica of right side      NEW MEDICATIONS STARTED DURING THIS VISIT:  ED Discharge Orders        Ordered    meloxicam (MOBIC) 15 MG tablet  Daily     06/13/17 2241    methocarbamol (ROBAXIN) 500 MG tablet  4 times daily     06/13/17 2241          This chart was dictated using voice recognition software/Dragon. Despite best efforts to proofread, errors can occur which can change the meaning. Any change was purely unintentional.    Racheal PatchesCuthriell, Meshell Abdulaziz D, PA-C 06/13/17 2253    Sharyn CreamerQuale, Mark, MD 06/14/17 [id number]

## 2017-09-22 ENCOUNTER — Ambulatory Visit
Admission: EM | Admit: 2017-09-22 | Discharge: 2017-09-22 | Disposition: A | Discharge status: Home / Self Care | Payer: 59 | Attending: Family Medicine | Admitting: Family Medicine

## 2017-09-22 ENCOUNTER — Other Ambulatory Visit: Payer: Self-pay

## 2017-09-22 DIAGNOSIS — H6501 Acute serous otitis media, right ear: Secondary | ICD-10-CM | POA: Diagnosis not present

## 2017-09-22 DIAGNOSIS — H6983 Other specified disorders of Eustachian tube, bilateral: Secondary | ICD-10-CM

## 2017-09-22 DIAGNOSIS — H9193 Unspecified hearing loss, bilateral: Secondary | ICD-10-CM

## 2017-09-22 MED ORDER — FLUTICASONE PROPIONATE 50 MCG/ACT NA SUSP: 2.0000 | Freq: Every day | NASAL | 0 refills | Status: DC

## 2017-09-22 MED ORDER — AMOXICILLIN-POT CLAVULANATE 875-125 MG PO TABS: 1.0000 | ORAL_TABLET | Freq: Two times a day (BID) | ORAL | 0 refills | Status: DC

## 2017-09-22 NOTE — ED Provider Notes (Signed)
[facility]    CSN: [id number] Arrival date & time: 09/22/17  1352  History   Chief Complaint Chief Complaint  Patient presents with  . Hearing Loss   HPI  52 year old male presents with the above complaint.  Patient reports a 2-day history of difficulty hearing.  Started in the right ear and now has progressed to the left.  He has tried Debrox and Sudafed.  He reports associated congestion.  No fever.  No chills.  No known exacerbating factors.  No other associated symptoms.  No complaints.  PMH, Surgical Hx, Family Hx, Social History reviewed and updated as below.  Past Medical History:  Diagnosis Date  . Diabetes mellitus without complication (HCC)   . Emphysema of lung (HCC)   . Hypertension    Past Surgical History:  Procedure Laterality Date  . FOOT SURGERY Right   . KNEE SURGERY Right   . SHOULDER SURGERY Left    Home Medications    Prior to Admission medications   Medication Sig Start Date End Date Taking? Authorizing Provider  aspirin EC 81 MG tablet Take 81 mg by mouth daily.   Yes [provider]  lisinopril-hydrochlorothiazide (PRINZIDE,ZESTORETIC) 20-25 MG tablet Take 1 tablet by mouth daily.   Yes [provider]  metFORMIN (GLUCOPHAGE) 1000 MG tablet Take 1,000 mg by mouth 2 (two) times daily with a meal.   Yes [provider]  amoxicillin-clavulanate (AUGMENTIN) 875-125 MG tablet Take 1 tablet by mouth every 12 (twelve) hours. 09/22/17   Tommie Samsook, Ogle Hoeffner G, DO  fluticasone (FLONASE) 50 MCG/ACT nasal spray Place 2 sprays into both nostrils daily. 09/22/17   Tommie Samsook, Christpoher Sievers G, DO   Family History Family History  Problem Relation Age of Onset  . Diabetes Father   . Diabetes Sister   . Diabetes Sister    Social History Social History   Tobacco Use  . Smoking status: Current Every Day Smoker    Packs/day: 1.50    Years: 30.00    Pack years: 45.00    Types: Cigarettes  . Smokeless tobacco: Former NeurosurgeonUser    Types: Chew    Substance Use Topics  . Alcohol use: Yes    Alcohol/week: 1.0 - 2.0 standard drinks    Types: 1 - 2 Cans of beer per week    Comment: occasionally  . Drug use: No    Allergies   Patient has no known allergies.   Review of Systems Review of Systems  Constitutional: Negative.   HENT: Positive for congestion and hearing loss.    Physical Exam Triage Vital Signs ED Triage Vitals  Enc Vitals Group     BP 09/22/17 1403 132/88     Pulse Rate 09/22/17 1403 99     Resp 09/22/17 1403 18     Temp 09/22/17 1403 98 F (36.7 C)     Temp Source 09/22/17 1403 Oral     SpO2 09/22/17 1403 98 %     Weight 09/22/17 1404 182 lb (82.6 kg)     Height 09/22/17 1404 5\' 10"  (1.778 m)     Head Circumference --      Peak Flow --      Pain Score 09/22/17 1403 0     Pain Loc --      Pain Edu? --      Excl. in GC? --    Updated Vital Signs BP 132/88 (BP Location: Left Arm)   Pulse 99   Temp 98 F (36.7 C) (Oral)  Resp 18   Ht 5\' 10"  (1.778 m)   Wt 82.6 kg   SpO2 98%   BMI 26.11 kg/m   Visual Acuity Right Eye Distance:   Left Eye Distance:   Bilateral Distance:    Right Eye Near:   Left Eye Near:    Bilateral Near:     Physical Exam  Constitutional: He is oriented to person, place, and time. He appears well-developed. No distress.  HENT:  Head: Normocephalic and atraumatic.  Left TM normal.  Right TM with effusion.  Cardiovascular: Normal rate and regular rhythm.  Pulmonary/Chest: Effort normal and breath sounds normal. He has no wheezes. He has no rales.  Neurological: He is alert and oriented to person, place, and time.  Psychiatric: He has a normal mood and affect. His behavior is normal.  Nursing note and vitals reviewed.  UC Treatments / Results  Labs (all labs ordered are listed, but only abnormal results are displayed) Labs Reviewed - No data to display  EKG None  Radiology No results found.  Procedures Procedures (including critical care  time)  Medications Ordered in UC Medications - No data to display  Initial Impression / Assessment and Plan / UC Course  I have reviewed the triage vital signs and the nursing notes.  Pertinent labs & imaging results that were available during my care of the patient were reviewed by me and considered in my medical decision making (see chart for details).    52 year old male presents with serous otitis media and eustachian tube dysfunction.  Treating with Augmentin.  Flonase.  Final Clinical Impressions(s) / UC Diagnoses   Final diagnoses:  Right acute serous otitis media, recurrence not specified  Dysfunction of both eustachian tubes     Discharge Instructions     Medication as prescribed.  Take care  Dr. Adriana Simas     ED Prescriptions    Medication Sig Dispense Auth. Provider   fluticasone (FLONASE) 50 MCG/ACT nasal spray Place 2 sprays into both nostrils daily. 16 g Zelphia Glover G, DO   amoxicillin-clavulanate (AUGMENTIN) 875-125 MG tablet Take 1 tablet by mouth every 12 (twelve) hours. 14 tablet Tommie Sams, DO     Controlled Substance Prescriptions [place] Controlled Substance Registry consulted? Not Applicable   Tommie Sams, DO 09/22/17 1526

## 2017-09-22 NOTE — Discharge Instructions (Signed)
Medication as prescribed.  Take care  Dr. Jim Philemon  

## 2017-09-22 NOTE — ED Triage Notes (Signed)
Patient complains of decreased hearing that has been worsening over last 2 days. Patient states that he tried debrox. Patient doesn't have any ear wax when I looked in ears. Patient states that he has tried sudafed.

## 2017-09-25 ENCOUNTER — Emergency Department
Admission: EM | Admit: 2017-09-25 | Discharge: 2017-09-25 | Disposition: A | Discharge status: Home / Self Care | Payer: 59 | Attending: Emergency Medicine | Admitting: Emergency Medicine

## 2017-09-25 ENCOUNTER — Other Ambulatory Visit: Payer: Self-pay

## 2017-09-25 DIAGNOSIS — I10 Essential (primary) hypertension: Secondary | ICD-10-CM | POA: Insufficient documentation

## 2017-09-25 DIAGNOSIS — Z79899 Other long term (current) drug therapy: Secondary | ICD-10-CM | POA: Insufficient documentation

## 2017-09-25 DIAGNOSIS — H6983 Other specified disorders of Eustachian tube, bilateral: Secondary | ICD-10-CM | POA: Diagnosis not present

## 2017-09-25 DIAGNOSIS — F17210 Nicotine dependence, cigarettes, uncomplicated: Secondary | ICD-10-CM | POA: Insufficient documentation

## 2017-09-25 DIAGNOSIS — E119 Type 2 diabetes mellitus without complications: Secondary | ICD-10-CM | POA: Diagnosis not present

## 2017-09-25 DIAGNOSIS — Z7982 Long term (current) use of aspirin: Secondary | ICD-10-CM | POA: Diagnosis not present

## 2017-09-25 DIAGNOSIS — H9393 Unspecified disorder of ear, bilateral: Secondary | ICD-10-CM | POA: Diagnosis present

## 2017-09-25 MED ORDER — PREDNISONE 10 MG PO TABS: 30.0000 mg | ORAL_TABLET | Freq: Every day | ORAL | 0 refills | Status: DC

## 2017-09-25 NOTE — ED Provider Notes (Signed)
[facility] Emergency Department Provider Note  ____________________________________________   First MD Initiated Contact with Patient 09/25/17 1257     (approximate)  I have reviewed the triage vital signs and the nursing notes.   HISTORY  Chief Complaint Ear Fullness    HPI Henry Anderson is a 52 y.o. male presents emergency department complaining of bilateral ear fullness.  He states it makes him dizzy and he cannot hear well.  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  He states he has to be able to hear and not be dizzy when he turns his head.  He was seen at the urgent care on Friday and given Augmentin and Flonase which has not helped.  He denies any fever or chills.  Denies any injuries.    Past Medical History:  Diagnosis Date  . Diabetes mellitus without complication (HCC)   . Emphysema of lung (HCC)   . Hypertension     There are no active problems to display for this patient.   Past Surgical History:  Procedure Laterality Date  . FOOT SURGERY Right   . KNEE SURGERY Right   . SHOULDER SURGERY Left     Prior to Admission medications   Medication Sig Start Date End Date Taking? Authorizing Provider  amoxicillin-clavulanate (AUGMENTIN) 875-125 MG tablet Take 1 tablet by mouth every 12 (twelve) hours. 09/22/17   Tommie Samsook, Jayce G, DO  aspirin EC 81 MG tablet Take 81 mg by mouth daily.    [provider]  fluticasone (FLONASE) 50 MCG/ACT nasal spray Place 2 sprays into both nostrils daily. 09/22/17   Tommie Samsook, Jayce G, DO  lisinopril-hydrochlorothiazide (PRINZIDE,ZESTORETIC) 20-25 MG tablet Take 1 tablet by mouth daily.    [provider]  metFORMIN (GLUCOPHAGE) 1000 MG tablet Take 1,000 mg by mouth 2 (two) times daily with a meal.    [provider]  predniSONE (DELTASONE) 10 MG tablet Take 3 tablets (30 mg total) by mouth daily with breakfast. 09/25/17   Sherrie MustacheFisher, Roselyn BeringSusan W, PA-C    Allergies Patient has no known  allergies.  Family History  Problem Relation Age of Onset  . Diabetes Father   . Diabetes Sister   . Diabetes Sister     Social History Social History   Tobacco Use  . Smoking status: Current Every Day Smoker    Packs/day: 1.50    Years: 30.00    Pack years: 45.00    Types: Cigarettes  . Smokeless tobacco: Former NeurosurgeonUser    Types: Chew  Substance Use Topics  . Alcohol use: Yes    Alcohol/week: 1.0 - 2.0 standard drinks    Types: 1 - 2 Cans of beer per week    Comment: occasionally  . Drug use: No    Review of Systems  Constitutional: No fever/chills Eyes: No visual changes. ENT: No sore throat.  Positive for ear fullness and decreased hearing Respiratory: Denies cough Genitourinary: Negative for dysuria. Musculoskeletal: Negative for back pain. Skin: Negative for rash.    ____________________________________________   PHYSICAL EXAM:  VITAL SIGNS: ED Triage Vitals  Enc Vitals Group     BP 09/25/17 1227 (!) 142/84     Pulse Rate 09/25/17 1227 (!) 110     Resp 09/25/17 1227 18     Temp 09/25/17 1227 (!) 97.5 F (36.4 C)     Temp Source 09/25/17 1227 Oral     SpO2 09/25/17 1227 98 %     Weight 09/25/17 1227 180  lb (81.6 kg)     Height 09/25/17 1227 5\' 9"  (1.753 m)     Head Circumference --      Peak Flow --      Pain Score 09/25/17 1231 0     Pain Loc --      Pain Edu? --      Excl. in GC? --     Constitutional: Alert and oriented. Well appearing and in no acute distress. Eyes: Conjunctivae are normal.  Head: Atraumatic. Ears: Both TMs are dull bilaterally.  No redness or drainage is noted.  Patient is able to hear soft voices. Nose: No congestion/rhinnorhea. Mouth/Throat: Mucous membranes are moist.   Neck:  supple no lymphadenopathy noted Cardiovascular: Normal rate, regular rhythm. Heart sounds are normal Respiratory: Normal respiratory effort.  No retractions, lungs c t a  GU: deferred Musculoskeletal: FROM all extremities, warm and well  perfused Neurologic:  Normal speech and language.  Skin:  Skin is warm, dry and intact. No rash noted. Psychiatric: Mood and affect are normal. Speech and behavior are normal.  ____________________________________________   LABS (all labs ordered are listed, but only abnormal results are displayed)  Labs Reviewed - No data to display ____________________________________________   ____________________________________________  RADIOLOGY    ____________________________________________   PROCEDURES  Procedure(s) performed: No  Procedures    ____________________________________________   INITIAL IMPRESSION / ASSESSMENT AND PLAN / ED COURSE  Pertinent labs & imaging results that were available during my care of the patient were reviewed by me and considered in my medical decision making (see chart for details).   Patient is a 52 year old male presents emergency department complaining of ear fullness and decreased hearing.  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  On physical exam patient appears well.  Both TMs are dull bilaterally.  Remainder the exam is unremarkable  Explained to the patient that he most likely has a dysfunction of the eustachian tubes.  He is to continue his Augmentin and Flonase, gave him a prescription for prednisone 30 mg daily for 3 days, and he can take a children's dose of Sudafed.  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  He is to follow-up with his regular doctor if not better in 3 to 5 days.  Return emergency department worsening.  He states he understands will comply.  Was discharged in stable condition     As part of my medical decision making, I reviewed the following data within the electronic MEDICAL RECORD NUMBER History obtained from family, Nursing notes reviewed and incorporated, Old chart reviewed, Notes from prior ED visits and [place] Controlled Substance  Database  ____________________________________________   FINAL CLINICAL IMPRESSION(S) / ED DIAGNOSES  Final diagnoses:  Dysfunction of Eustachian tube, bilateral      NEW MEDICATIONS STARTED DURING THIS VISIT:  New Prescriptions   PREDNISONE (DELTASONE) 10 MG TABLET    Take 3 tablets (30 mg total) by mouth daily with breakfast.     Note:  This document was prepared using Dragon voice recognition software and may include unintentional dictation errors.    Faythe Ghee, PA-C 09/25/17 1643    Rockne Menghini, MD 09/28/17 2230

## 2017-09-25 NOTE — Discharge Instructions (Signed)
Follow-up with your regular doctor if not better in 3 to 5 days.  Return emergency department worsening.  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

## 2017-09-25 NOTE — ED Triage Notes (Signed)
Pt states he was seen at urgent care on Friday for ear fullness/.decreased hearing in one ear and now it is in both, states he was prescribed amoxicillin and Flonase with no relief. Denies any pain

## 2017-09-25 NOTE — ED Notes (Signed)
See triage note  Presents with ear fullness  States he was seen last Friday [facility]  States they found some blood and fluid in right ear  Was given antibiotic and flonase  States now feels like the sxs' are on the left

## 2017-10-13 ENCOUNTER — Other Ambulatory Visit: Payer: Self-pay | Admitting: Family Medicine

## 2018-07-30 ENCOUNTER — Other Ambulatory Visit: Payer: Self-pay

## 2018-07-30 ENCOUNTER — Encounter: Payer: Self-pay | Admitting: Emergency Medicine

## 2018-07-30 ENCOUNTER — Emergency Department
Admission: EM | Admit: 2018-07-30 | Discharge: 2018-07-30 | Disposition: A | Discharge status: Home / Self Care | Payer: Worker's Compensation | Attending: Emergency Medicine | Admitting: Emergency Medicine

## 2018-07-30 DIAGNOSIS — E119 Type 2 diabetes mellitus without complications: Secondary | ICD-10-CM | POA: Insufficient documentation

## 2018-07-30 DIAGNOSIS — Z23 Encounter for immunization: Secondary | ICD-10-CM | POA: Diagnosis not present

## 2018-07-30 DIAGNOSIS — L089 Local infection of the skin and subcutaneous tissue, unspecified: Secondary | ICD-10-CM | POA: Insufficient documentation

## 2018-07-30 DIAGNOSIS — Z7984 Long term (current) use of oral hypoglycemic drugs: Secondary | ICD-10-CM | POA: Diagnosis not present

## 2018-07-30 DIAGNOSIS — T148XXA Other injury of unspecified body region, initial encounter: Secondary | ICD-10-CM

## 2018-07-30 DIAGNOSIS — Z5189 Encounter for other specified aftercare: Secondary | ICD-10-CM

## 2018-07-30 DIAGNOSIS — F17210 Nicotine dependence, cigarettes, uncomplicated: Secondary | ICD-10-CM | POA: Diagnosis not present

## 2018-07-30 DIAGNOSIS — I10 Essential (primary) hypertension: Secondary | ICD-10-CM | POA: Diagnosis not present

## 2018-07-30 MED ORDER — SULFAMETHOXAZOLE-TRIMETHOPRIM 800-160 MG PO TABS
1.0000 | ORAL_TABLET | Freq: Once | ORAL | Status: AC
  Administered 2018-07-30: 1 via ORAL
  Filled 2018-07-30: qty 1

## 2018-07-30 MED ORDER — TETANUS-DIPHTH-ACELL PERTUSSIS 5-2.5-18.5 LF-MCG/0.5 IM SUSP
0.5000 mL | Freq: Once | INTRAMUSCULAR | Status: AC
  Administered 2018-07-30: 0.5 mL via INTRAMUSCULAR
  Filled 2018-07-30: qty 0.5

## 2018-07-30 MED ORDER — SULFAMETHOXAZOLE-TRIMETHOPRIM 800-160 MG PO TABS: 1.0000 | ORAL_TABLET | Freq: Two times a day (BID) | ORAL | 0 refills | Status: DC

## 2018-07-30 NOTE — ED Triage Notes (Signed)
Patient ambulatory to triage with steady gait, without difficulty or distress noted, mask in place; pt reports metal fell out of truck hitting his left FA last week; scabbed area noted with some drainage

## 2018-07-30 NOTE — ED Notes (Signed)

## 2018-07-30 NOTE — ED Provider Notes (Signed)
[facility] Emergency Department Provider Note  ____________________________________________  Time seen: Approximately 10:09 PM  I have reviewed the triage vital signs and the nursing notes.   HISTORY  Chief Complaint Wound Check    HPI Henry Anderson is a 53 y.o. male who presents the emergency department for evaluation of possible infected wound.  Patient reports that approximately a week ago he was at work, piece of metal fell and the patient was struck in the arm.  The sustained 2 lacerations to the forearm.  Patient reports that he thoroughly cleansed the area but did not seek any medical attention at that time.  Patient is keeping the area bandaged, applying topical triple antibiotic ointment.  Patient reports that areas had mostly improved with one exception of 1 of the lacerations.  Patient reports that initially it was draining what apparently was a serosanguineous drainage has turned into a yellowish/purulent drainage.  Area is mildly tender to palpation with minimal edema and erythema around this area.  No other complaints at this time.  Patient does have a history of diabetes, emphysema and hypertension.  No complaints of chronic medical problems.         Past Medical History:  Diagnosis Date  . Diabetes mellitus without complication (HCC)   . Emphysema of lung (HCC)   . Hypertension     There are no active problems to display for this patient.   Past Surgical History:  Procedure Laterality Date  . FOOT SURGERY Right   . KNEE SURGERY Right   . SHOULDER SURGERY Left     Prior to Admission medications   Medication Sig Start Date End Date Taking? Authorizing Provider  amoxicillin-clavulanate (AUGMENTIN) 875-125 MG tablet Take 1 tablet by mouth every 12 (twelve) hours. 09/22/17   Tommie Samsook, Jayce G, DO  aspirin EC 81 MG tablet Take 81 mg by mouth daily.    [provider]  fluticasone (FLONASE) 50 MCG/ACT nasal spray Place 2 sprays into  both nostrils daily. 09/22/17   Tommie Samsook, Jayce G, DO  lisinopril-hydrochlorothiazide (PRINZIDE,ZESTORETIC) 20-25 MG tablet Take 1 tablet by mouth daily.    [provider]  metFORMIN (GLUCOPHAGE) 1000 MG tablet Take 1,000 mg by mouth 2 (two) times daily with a meal.    [provider]  predniSONE (DELTASONE) 10 MG tablet Take 3 tablets (30 mg total) by mouth daily with breakfast. 09/25/17   Sherrie MustacheFisher, Roselyn BeringSusan W, PA-C  sulfamethoxazole-trimethoprim (BACTRIM DS) 800-160 MG tablet Take 1 tablet by mouth 2 (two) times daily. 07/30/18   Ashad Fawbush, Delorise RoyalsJonathan D, PA-C    Allergies Patient has no known allergies.  Family History  Problem Relation Age of Onset  . Diabetes Father   . Diabetes Sister   . Diabetes Sister     Social History Social History   Tobacco Use  . Smoking status: Current Every Day Smoker    Packs/day: 1.50    Years: 30.00    Pack years: 45.00    Types: Cigarettes  . Smokeless tobacco: Former NeurosurgeonUser    Types: Chew  Substance Use Topics  . Alcohol use: Yes    Alcohol/week: 1.0 - 2.0 standard drinks    Types: 1 - 2 Cans of beer per week    Comment: occasionally  . Drug use: No     Review of Systems  Constitutional: No fever/chills Eyes: No visual changes. No discharge ENT: No upper respiratory complaints. Cardiovascular: no chest pain. Respiratory: no cough. No SOB. Gastrointestinal: No abdominal pain.  No nausea, no vomiting.  Musculoskeletal: Negative for musculoskeletal pain. Skin: Possible infected laceration to the left forearm Neurological: Negative for headaches, focal weakness or numbness. 10-point ROS otherwise negative.  ____________________________________________   PHYSICAL EXAM:  VITAL SIGNS: ED Triage Vitals  Enc Vitals Group     BP 07/30/18 2023 (!) 168/94     Pulse Rate 07/30/18 2023 97     Resp 07/30/18 2023 18     Temp 07/30/18 2023 98.2 F (36.8 C)     Temp Source 07/30/18 2023 Oral     SpO2 07/30/18 2023 99 %     Weight  07/30/18 2022 145 lb (65.8 kg)     Height 07/30/18 2022 5\' 9"  (1.753 m)     Head Circumference --      Peak Flow --      Pain Score 07/30/18 2022 1     Pain Loc --      Pain Edu? --      Excl. in GC? --      Constitutional: Alert and oriented. Well appearing and in no acute distress. Eyes: Conjunctivae are normal. PERRL. EOMI. Head: Atraumatic. ENT:      Ears:       Nose: No congestion/rhinnorhea.      Mouth/Throat: Mucous membranes are moist.  Neck: No stridor.    Cardiovascular: Normal rate, regular rhythm. Normal S1 and S2.  Good peripheral circulation. Respiratory: Normal respiratory effort without tachypnea or retractions. Lungs CTAB. Good air entry to the bases with no decreased or absent breath sounds. Musculoskeletal: Full range of motion to all extremities. No gross deformities appreciated. Neurologic:  Normal speech and language. No gross focal neurologic deficits are appreciated.  Skin:  Skin is warm, dry and intact. No rash noted.  2 healing lacerations noted to the left forearm.  Most proximal laceration is almost completely healed with no signs of infection.  Patient has a second, longer laceration distal to the first.  The lateral aspect is mildly erythematous, with a honey colored crust.  Laceration medially is healing well with no signs of infection.  Palpation reveals no fluctuance or induration.  Palpation does not express any purulent drainage at this time. Psychiatric: Mood and affect are normal. Speech and behavior are normal. Patient exhibits appropriate insight and judgement.   ____________________________________________   LABS (all labs ordered are listed, but only abnormal results are displayed)  Labs Reviewed - No data to display ____________________________________________  EKG   ____________________________________________  RADIOLOGY   No results found.  ____________________________________________    PROCEDURES  Procedure(s) performed:     Procedures    Medications  sulfamethoxazole-trimethoprim (BACTRIM DS) 800-160 MG per tablet 1 tablet (1 tablet Oral Given 07/30/18 2243)  Tdap (BOOSTRIX) injection 0.5 mL (0.5 mLs Intramuscular Given 07/30/18 2242)     ____________________________________________   INITIAL IMPRESSION / ASSESSMENT AND PLAN / ED COURSE  Pertinent labs & imaging results that were available during my care of the patient were reviewed by me and considered in my medical decision making (see chart for details).  Review of the [place] CSRS was performed in accordance of the [facility] prior to dispensing any controlled drugs.           Patient's diagnosis is consistent with wound check with wound infection.  Patient presented to the emergency department for evaluation of a possible infected laceration.  Injury occurred a week ago.  Patient did not seek medical care initially.  On exam, patient does have findings consistent with  mild infection of part of 1 laceration.  No evidence of abscess requiring incision and drainage.  At this time, wound care instructions are discussed with patient.  Patient will be placed on antibiotics.  Follow-up with primary care as needed.. Patient is given ED precautions to return to the ED for any worsening or new symptoms.     ____________________________________________  FINAL CLINICAL IMPRESSION(S) / ED DIAGNOSES  Final diagnoses:  Visit for wound check  Wound infection      NEW MEDICATIONS STARTED DURING THIS VISIT:  ED Discharge Orders         Ordered    sulfamethoxazole-trimethoprim (BACTRIM DS) 800-160 MG tablet  2 times daily     07/30/18 2237              This chart was dictated using voice recognition software/Dragon. Despite best efforts to proofread, errors can occur which can change the meaning. Any change was purely unintentional.    Darletta Moll, PA-C 07/30/18 2307    Vanessa Tunkhannock, MD 07/31/18 [phone]

## 2018-12-05 ENCOUNTER — Encounter: Payer: Self-pay | Admitting: Emergency Medicine

## 2018-12-05 ENCOUNTER — Emergency Department
Admission: EM | Admit: 2018-12-05 | Discharge: 2018-12-05 | Disposition: A | Discharge status: Home / Self Care | Payer: 59 | Attending: Emergency Medicine | Admitting: Emergency Medicine

## 2018-12-05 ENCOUNTER — Other Ambulatory Visit: Payer: Self-pay

## 2018-12-05 DIAGNOSIS — Z7984 Long term (current) use of oral hypoglycemic drugs: Secondary | ICD-10-CM | POA: Insufficient documentation

## 2018-12-05 DIAGNOSIS — I10 Essential (primary) hypertension: Secondary | ICD-10-CM | POA: Insufficient documentation

## 2018-12-05 DIAGNOSIS — E119 Type 2 diabetes mellitus without complications: Secondary | ICD-10-CM | POA: Diagnosis not present

## 2018-12-05 DIAGNOSIS — Z7982 Long term (current) use of aspirin: Secondary | ICD-10-CM | POA: Diagnosis not present

## 2018-12-05 DIAGNOSIS — Z79899 Other long term (current) drug therapy: Secondary | ICD-10-CM | POA: Insufficient documentation

## 2018-12-05 DIAGNOSIS — Z0189 Encounter for other specified special examinations: Secondary | ICD-10-CM | POA: Insufficient documentation

## 2018-12-05 DIAGNOSIS — F17210 Nicotine dependence, cigarettes, uncomplicated: Secondary | ICD-10-CM | POA: Insufficient documentation

## 2018-12-05 LAB — HEMOGLOBIN A1C
Hgb A1c MFr Bld: 8.2 % — ABNORMAL HIGH (ref 4.8–5.6)
Mean Plasma Glucose: 188.64 mg/dL

## 2018-12-05 NOTE — ED Notes (Signed)
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

## 2018-12-05 NOTE — ED Triage Notes (Signed)
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

## 2018-12-05 NOTE — ED Provider Notes (Signed)
[facility] Emergency Department Provider Note  ____________________________________________  Time seen: Approximately 3:56 PM  I have reviewed the triage vital signs and the nursing notes.   HISTORY  Chief Complaint lab work    HPI Henry Anderson is a 53 y.o. male presents to the emergency department to have a blood test for hemoglobin A1c.  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  Patient has no other questions or concerns.       Past Medical History:  Diagnosis Date  . Diabetes mellitus without complication ([place])   . Emphysema of lung ([place])   . Hypertension     There are no active problems to display for this patient.   Past Surgical History:  Procedure Laterality Date  . FOOT SURGERY Right   . KNEE SURGERY Right   . SHOULDER SURGERY Left     Prior to Admission medications   Medication Sig Start Date End Date Taking? Authorizing Provider  amoxicillin-clavulanate (AUGMENTIN) 875-125 MG tablet Take 1 tablet by mouth every 12 (twelve) hours. 09/22/17   Coral Spikes, DO  aspirin EC 81 MG tablet Take 81 mg by mouth daily.    [provider]  fluticasone (FLONASE) 50 MCG/ACT nasal spray Place 2 sprays into both nostrils daily. 09/22/17   Coral Spikes, DO  lisinopril-hydrochlorothiazide (PRINZIDE,ZESTORETIC) 20-25 MG tablet Take 1 tablet by mouth daily.    [provider]  metFORMIN (GLUCOPHAGE) 1000 MG tablet Take 1,000 mg by mouth 2 (two) times daily with a meal.    [provider]  predniSONE (DELTASONE) 10 MG tablet Take 3 tablets (30 mg total) by mouth daily with breakfast. 09/25/17   Caryn Section Linden Dolin, PA-C  sulfamethoxazole-trimethoprim (BACTRIM DS) 800-160 MG tablet Take 1 tablet by mouth 2 (two) times daily. 07/30/18   Cuthriell, Charline Bills, PA-C    Allergies Patient has no known allergies.  Family History  Problem  Relation Age of Onset  . Diabetes Father   . Diabetes Sister   . Diabetes Sister     Social History Social History   Tobacco Use  . Smoking status: Current Every Day Smoker    Packs/day: 1.50    Years: 30.00    Pack years: 45.00    Types: Cigarettes  . Smokeless tobacco: Former Systems developer    Types: Chew  Substance Use Topics  . Alcohol use: Yes    Alcohol/week: 1.0 - 2.0 standard drinks    Types: 1 - 2 Cans of beer per week    Comment: occasionally  . Drug use: No     Review of Systems  Constitutional: No fever/chills Eyes: No visual changes. No discharge ENT: No upper respiratory complaints. Cardiovascular: no chest pain. Respiratory: no cough. No SOB. Gastrointestinal: No abdominal pain.  No nausea, no vomiting.  No diarrhea.  No constipation. Genitourinary: Negative for dysuria. No hematuria Musculoskeletal: Negative for musculoskeletal pain. Skin: Negative for rash, abrasions, lacerations, ecchymosis. Neurological: Negative for headaches, focal weakness or numbness.   ____________________________________________   PHYSICAL EXAM:  VITAL SIGNS: ED Triage Vitals  Enc Vitals Group     BP 12/05/18 1530 (!) 161/90     Pulse Rate 12/05/18 1530 92     Resp 12/05/18 1530 15     Temp 12/05/18 1530 98.5 F (36.9 C)     Temp Source 12/05/18 1530 Oral  SpO2 12/05/18 1530 98 %     Weight 12/05/18 1526 189 lb (85.7 kg)     Height 12/05/18 1526 5\' 9"  (1.753 m)     Head Circumference --      Peak Flow --      Pain Score 12/05/18 1526 0     Pain Loc --      Pain Edu? --      Excl. in GC? --      Constitutional: Alert and oriented. Well appearing and in no acute distress. Eyes: Conjunctivae are normal. PERRL. EOMI. Head: Atraumatic. Cardiovascular: Normal rate, regular rhythm. Normal S1 and S2.  Good peripheral circulation. Respiratory: Normal respiratory effort without tachypnea or retractions. Lungs CTAB. Good air entry to the bases with no decreased or absent  breath sounds. Musculoskeletal: Full range of motion to all extremities. No gross deformities appreciated. Neurologic:  Normal speech and language. No gross focal neurologic deficits are appreciated.  Skin:  Skin is warm, dry and intact. No rash noted. Psychiatric: Mood and affect are normal. Speech and behavior are normal. Patient exhibits appropriate insight and judgement.   ____________________________________________   LABS (all labs ordered are listed, but only abnormal results are displayed)  Labs Reviewed  HEMOGLOBIN A1C   ____________________________________________  EKG   ____________________________________________  RADIOLOGY   No results found.  ____________________________________________    PROCEDURES  Procedure(s) performed:    Procedures    Medications - No data to display   ____________________________________________   INITIAL IMPRESSION / ASSESSMENT AND PLAN / ED COURSE  Pertinent labs & imaging results that were available during my care of the patient were reviewed by me and considered in my medical decision making (see chart for details).  Review of the [place] CSRS was performed in accordance of the [facility] prior to dispensing any controlled drugs.           Assessment and plan Encounter for routine lab work 53 year old male presents to the emergency department for a hemoglobin A1c blood test as patient is trying to obtain a DOT physical.  Hemoglobin A1c is in process at this time.  Patient was discharged and advised to follow-up regarding results as hemoglobin A1c is a send out.  He voiced understanding.  All patient questions were answered     ____________________________________________  FINAL CLINICAL IMPRESSION(S) / ED DIAGNOSES  Final diagnoses:  Encounter for laboratory test      NEW MEDICATIONS STARTED DURING THIS VISIT:  ED Discharge Orders    None          This chart was dictated using voice recognition  software/Dragon. Despite best efforts to proofread, errors can occur which can change the meaning. Any change was purely unintentional.    45, PA-C 12/05/18 1558    12/07/18, MD 12/07/18 1510

## 2019-09-07 ENCOUNTER — Emergency Department
Admission: EM | Admit: 2019-09-07 | Discharge: 2019-09-07 | Disposition: A | Discharge status: Home / Self Care | Payer: Self-pay | Attending: Student in an Organized Health Care Education/Training Program | Admitting: Student in an Organized Health Care Education/Training Program

## 2019-09-07 ENCOUNTER — Other Ambulatory Visit: Payer: Self-pay

## 2019-09-07 ENCOUNTER — Emergency Department: Payer: Self-pay

## 2019-09-07 DIAGNOSIS — F17210 Nicotine dependence, cigarettes, uncomplicated: Secondary | ICD-10-CM | POA: Insufficient documentation

## 2019-09-07 DIAGNOSIS — M25561 Pain in right knee: Secondary | ICD-10-CM | POA: Insufficient documentation

## 2019-09-07 DIAGNOSIS — Z7982 Long term (current) use of aspirin: Secondary | ICD-10-CM | POA: Insufficient documentation

## 2019-09-07 DIAGNOSIS — I10 Essential (primary) hypertension: Secondary | ICD-10-CM | POA: Insufficient documentation

## 2019-09-07 DIAGNOSIS — E119 Type 2 diabetes mellitus without complications: Secondary | ICD-10-CM | POA: Insufficient documentation

## 2019-09-07 DIAGNOSIS — Z79899 Other long term (current) drug therapy: Secondary | ICD-10-CM | POA: Insufficient documentation

## 2019-09-07 IMAGING — DX DG KNEE COMPLETE 4+V*R*
4 series · 4 of 4 positions shown · non-contrast
Comparison: [M6]

CLINICAL DATA: Injury.

EXAM:
RIGHT KNEE - COMPLETE 4+ VIEW

[knee ap]
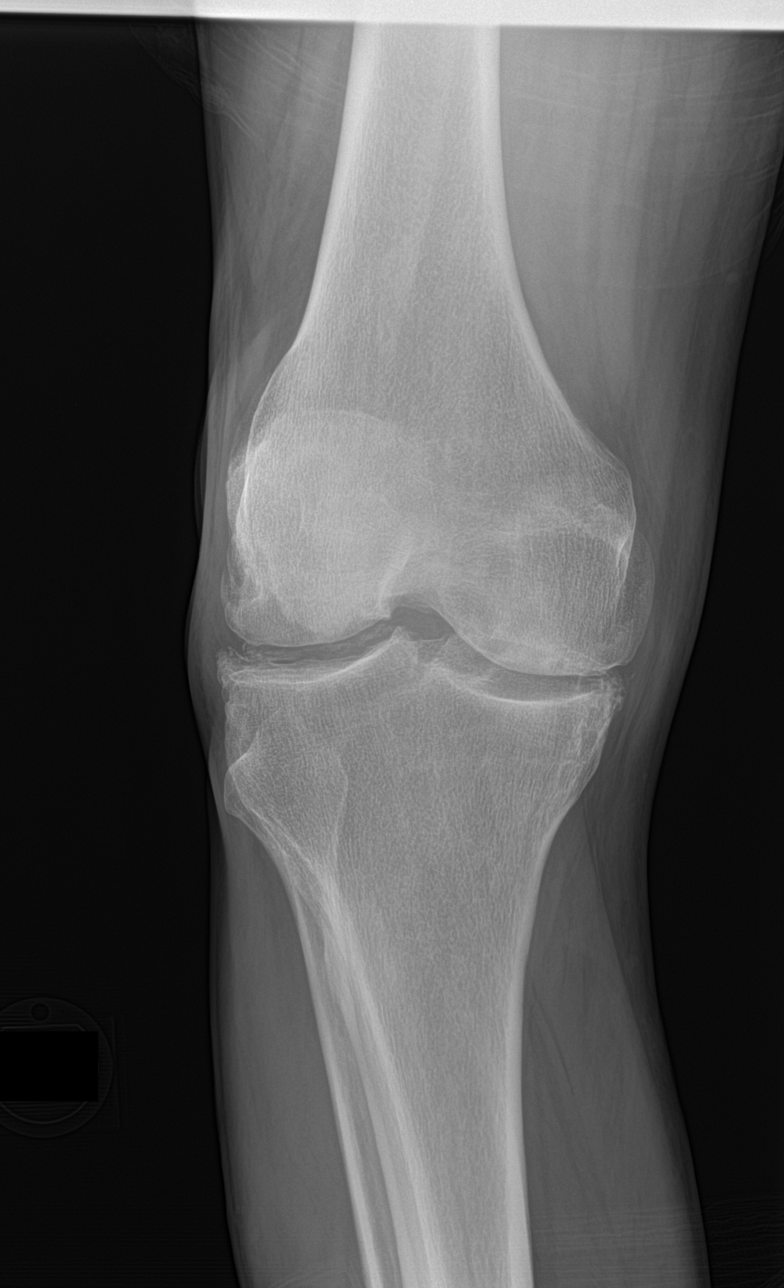

[knee obl (1 of 2)]
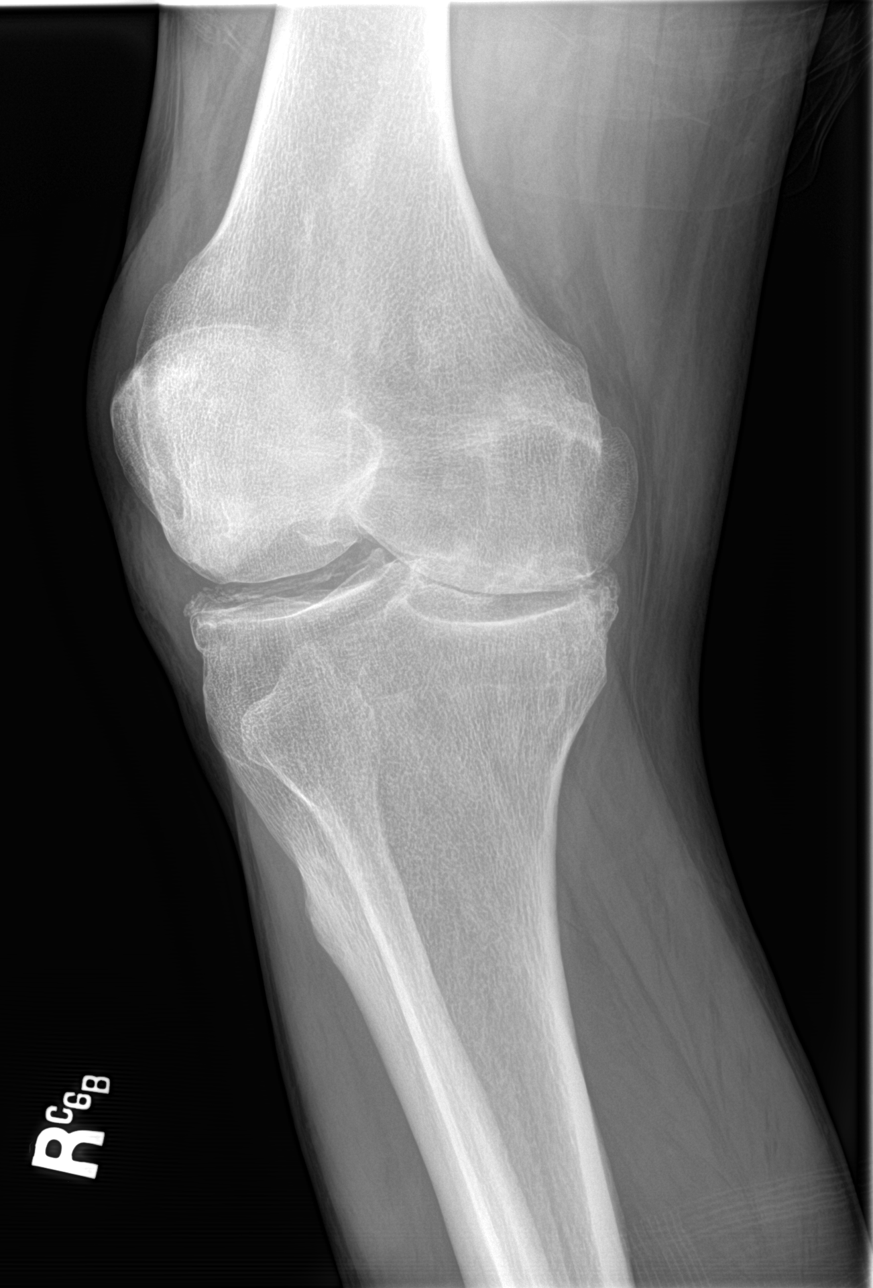

[knee lat]
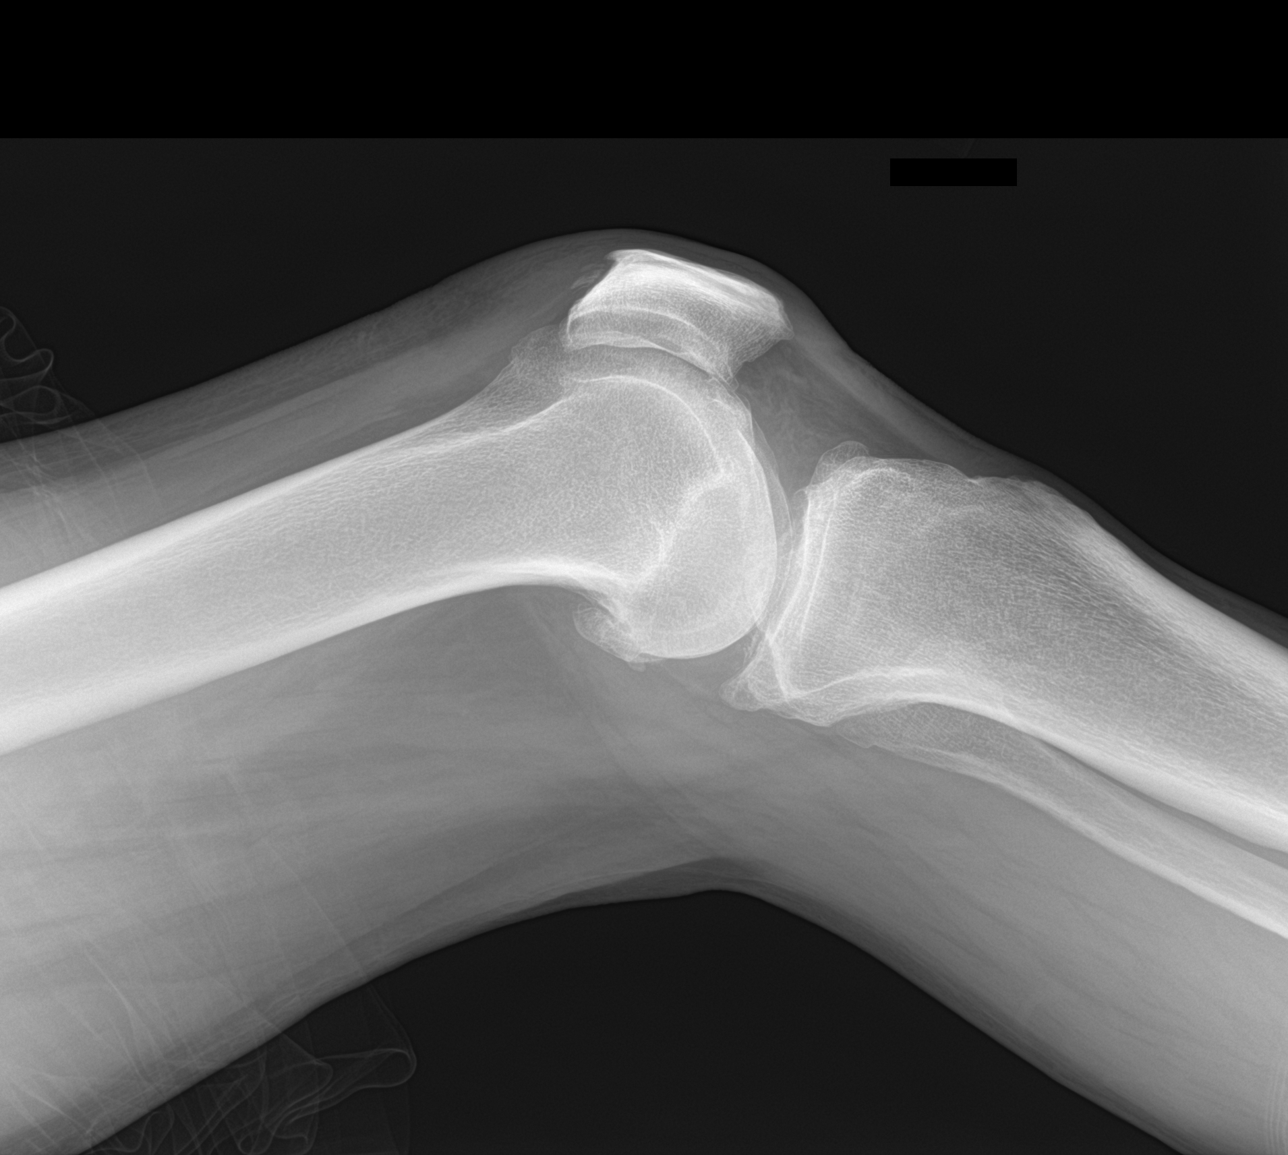

[knee obl (2 of 2)]
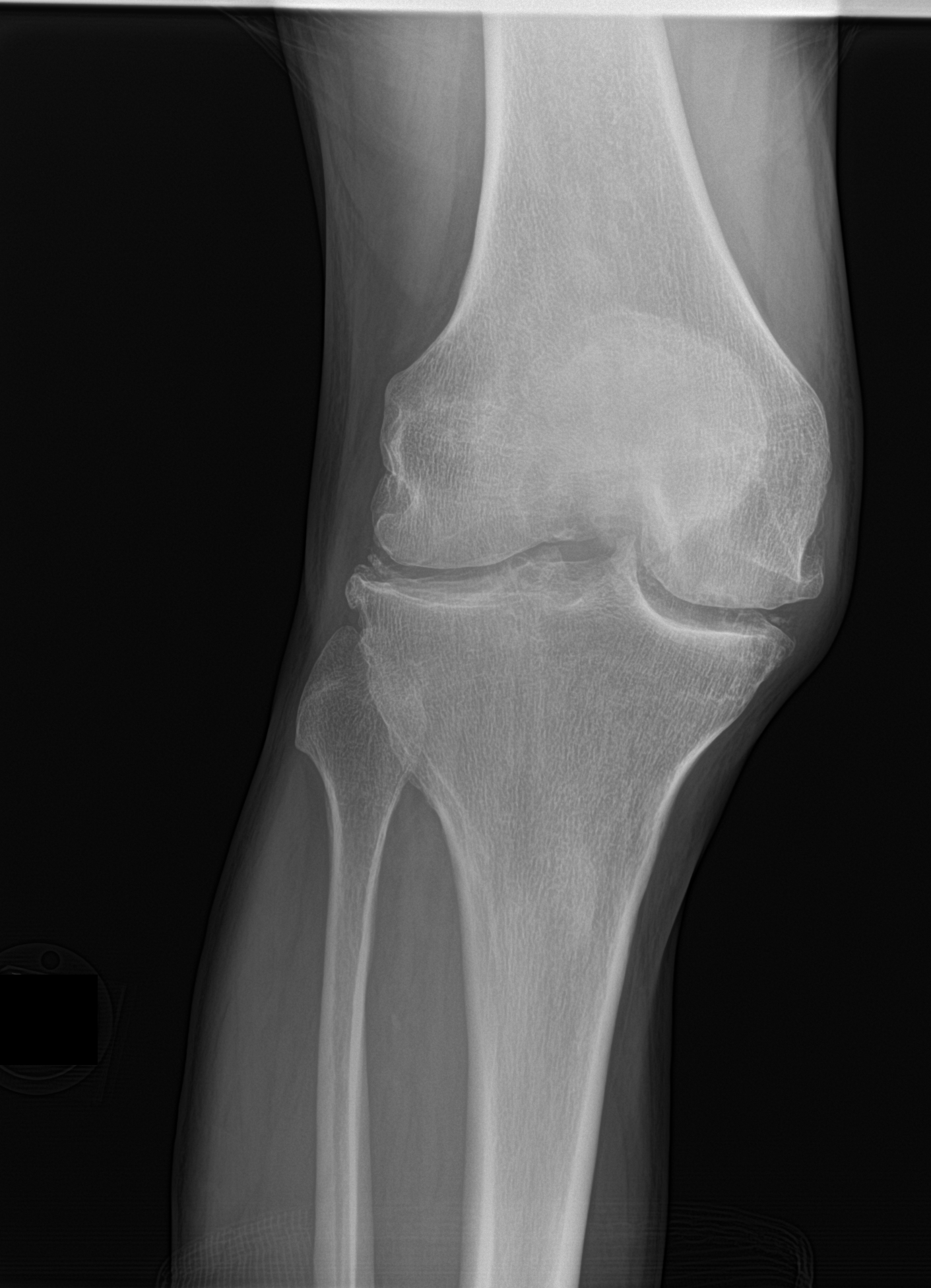

[4 of 4 positions shown; findings below may reference images not displayed]

FINDINGS: Alignment is anatomic. There is no acute fracture. No significant
joint effusion. There are tricompartmental changes of
osteoarthritis. Joint space narrowing is most significant medially.
Chondrocalcinosis is noted.
IMPRESSION: No acute fracture.  Osteoarthritis.

## 2019-09-07 MED ORDER — LISINOPRIL-HYDROCHLOROTHIAZIDE 20-25 MG PO TABS: 1.0000 | ORAL_TABLET | Freq: Every day | ORAL | 3 refills | Status: DC

## 2019-09-07 MED ORDER — METFORMIN HCL 1000 MG PO TABS: 1000.0000 mg | ORAL_TABLET | Freq: Two times a day (BID) | ORAL | 3 refills | Status: DC

## 2019-09-07 MED ORDER — MELOXICAM 15 MG PO TABS: 15.0000 mg | ORAL_TABLET | Freq: Every day | ORAL | 2 refills | Status: AC

## 2019-09-07 MED ORDER — TRAMADOL HCL 50 MG PO TABS: 50.0000 mg | ORAL_TABLET | Freq: Four times a day (QID) | ORAL | 0 refills | Status: DC | PRN

## 2019-09-07 NOTE — ED Provider Notes (Signed)
[facility] Emergency Department Provider Note  ____________________________________________   First MD Initiated Contact with Patient 09/07/19 1010     (approximate)  I have reviewed the triage vital signs and the nursing notes.   HISTORY  Chief Complaint Knee Pain (right)    HPI Henry Anderson is a 54 y.o. male presents emergency department complaint of right knee pain.  States that he was on his deck playing with his grandson and ran directly into the 4 x 4 posts.  States it has been hurting about a week.  Coming more difficult to extend.  He has been soaking in warm water and then applying ice.  No fever or chills.  No numbness or tingling.    Past Medical History:  Diagnosis Date  . Diabetes mellitus without complication (HCC)   . Emphysema of lung (HCC)   . Hypertension     There are no problems to display for this patient.   Past Surgical History:  Procedure Laterality Date  . FOOT SURGERY Right   . KNEE SURGERY Right   . SHOULDER SURGERY Left     Prior to Admission medications   Medication Sig Start Date End Date Taking? Authorizing Provider  aspirin EC 81 MG tablet Take 81 mg by mouth daily.    [provider]  fluticasone (FLONASE) 50 MCG/ACT nasal spray Place 2 sprays into both nostrils daily. 09/22/17   Tommie Sams, DO  lisinopril-hydrochlorothiazide (PRINZIDE,ZESTORETIC) 20-25 MG tablet Take 1 tablet by mouth daily.    [provider]  meloxicam (MOBIC) 15 MG tablet Take 1 tablet (15 mg total) by mouth daily. 09/07/19 09/06/20  Sherrie Mustache Roselyn Bering, PA-C  metFORMIN (GLUCOPHAGE) 1000 MG tablet Take 1,000 mg by mouth 2 (two) times daily with a meal.    [provider]  traMADol (ULTRAM) 50 MG tablet Take 1 tablet (50 mg total) by mouth every 6 (six) hours as needed. 09/07/19   Faythe Ghee, PA-C    Allergies Patient has no known allergies.  Family History  Problem Relation Age of Onset  . Diabetes Father    . Diabetes Sister   . Diabetes Sister     Social History Social History   Tobacco Use  . Smoking status: Current Every Day Smoker    Packs/day: 1.50    Years: 30.00    Pack years: 45.00    Types: Cigarettes  . Smokeless tobacco: Former Neurosurgeon    Types: Engineer, drilling  . Vaping Use: Never used  Substance Use Topics  . Alcohol use: Yes    Alcohol/week: 1.0 - 2.0 standard drink    Types: 1 - 2 Cans of beer per week    Comment: occasionally  . Drug use: No    Review of Systems  Constitutional: No fever/chills Eyes: No visual changes. ENT: No sore throat. Respiratory: Denies cough Genitourinary: Negative for dysuria. Musculoskeletal: Negative for back pain.  Positive right knee pain Skin: Negative for rash. Psychiatric: no mood changes,     ____________________________________________   PHYSICAL EXAM:  VITAL SIGNS: ED Triage Vitals  Enc Vitals Group     BP 09/07/19 1008 (!) 189/98     Pulse Rate 09/07/19 1005 93     Resp 09/07/19 1005 18     Temp 09/07/19 1005 98.7 F (37.1 C)     Temp Source 09/07/19 1005 Oral     SpO2 09/07/19 1005 97 %     Weight 09/07/19 1006 181 lb (  82.1 kg)     Height 09/07/19 1006 5\' 10"  (1.778 m)     Head Circumference --      Peak Flow --      Pain Score 09/07/19 1004 9     Pain Loc --      Pain Edu? --      Excl. in GC? --     Constitutional: Alert and oriented. Well appearing and in no acute distress. Eyes: Conjunctivae are normal.  Head: Atraumatic. Nose: No congestion/rhinnorhea. Mouth/Throat: Mucous membranes are moist.   Neck:  supple no lymphadenopathy noted Cardiovascular: Normal rate, regular rhythm.  Respiratory: Normal respiratory effort.  No retractions,  GU: deferred Musculoskeletal: Decreased range of motion of the right knee with extension, area is tender to palpation at the suprapatellar area, neurovascular is intact, negative 09/09/19' sign and no calf tenderness is noted  neurologic:  Normal speech and  language.  Skin:  Skin is warm, dry and intact. No rash noted. Psychiatric: Mood and affect are normal. Speech and behavior are normal.  ____________________________________________   LABS (all labs ordered are listed, but only abnormal results are displayed)  Labs Reviewed - No data to display ____________________________________________   ____________________________________________  RADIOLOGY  X-ray of the right knee is negative  ____________________________________________   PROCEDURES  Procedure(s) performed: Immobilizer and crutches by the tech   Procedures    ____________________________________________   INITIAL IMPRESSION / ASSESSMENT AND PLAN / ED COURSE  Pertinent labs & imaging results that were available during my care of the patient were reviewed by me and considered in my medical decision making (see chart for details).   Patient is 54 year old male presents emergency department for right knee pain.  Injury happened about a week ago.  Increased difficulty with extension.  Physical exam shows her right knee to be tender with some suprapatellar swelling.  X-ray of the right knee is negative.  Patient is stable.  We will discharge the patient with knee mobilizer approaches.  Prescription for meloxicam and tramadol for pain control.  Follow-up with orthopedics.  Apply ice.  He was discharged stable condition     Henry Anderson was evaluated in Emergency Department on 09/07/2019 for the symptoms described in the history of present illness. He was evaluated in the context of the global COVID-19 pandemic, which necessitated consideration that the patient might be at risk for infection with the SARS-CoV-2 virus that causes COVID-19. Institutional protocols and algorithms that pertain to the evaluation of patients at risk for COVID-19 are in a state of rapid change based on information released by regulatory bodies including the CDC and federal and state  organizations. These policies and algorithms were followed during the patient's care in the ED.    As part of my medical decision making, I reviewed the following data within the electronic MEDICAL RECORD NUMBER Nursing notes reviewed and incorporated, Old chart reviewed, Radiograph reviewed , Notes from prior ED visits and [place] Controlled Substance Database  ____________________________________________   FINAL CLINICAL IMPRESSION(S) / ED DIAGNOSES  Final diagnoses:  Acute pain of right knee      NEW MEDICATIONS STARTED DURING THIS VISIT:  New Prescriptions   MELOXICAM (MOBIC) 15 MG TABLET    Take 1 tablet (15 mg total) by mouth daily.   TRAMADOL (ULTRAM) 50 MG TABLET    Take 1 tablet (50 mg total) by mouth every 6 (six) hours as needed.     Note:  This document was prepared using 09/09/2019  and may include unintentional dictation errors.    Faythe Ghee, PA-C 09/07/19 1117    Willy Eddy, MD 09/07/19 1124

## 2019-09-07 NOTE — ED Triage Notes (Signed)
Pt states he bumped his right knee a couple days ago and has not gotten better- pt states pain has gotten progressively worse- pt right knee is swollen, but no redness noted- pt has a home knee brace on it

## 2019-09-07 NOTE — ED Notes (Signed)
See triage note  Presents with pain to right knee  States he hit the corner of deck post  Unable to bear full wt  This occurred about 1 week ago

## 2019-09-07 NOTE — Discharge Instructions (Signed)
Follow-up with orthopedics if not improving in 1 week.  Wear the knee immobilizer and use the crutches to decrease inflammation in the knee.

## 2020-08-21 ENCOUNTER — Emergency Department
Admission: EM | Admit: 2020-08-21 | Discharge: 2020-08-21 | Disposition: A | Discharge status: Home / Self Care | Payer: Self-pay | Attending: Emergency Medicine | Admitting: Emergency Medicine

## 2020-08-21 ENCOUNTER — Encounter: Payer: Self-pay | Admitting: Emergency Medicine

## 2020-08-21 ENCOUNTER — Other Ambulatory Visit: Payer: Self-pay

## 2020-08-21 DIAGNOSIS — Z7984 Long term (current) use of oral hypoglycemic drugs: Secondary | ICD-10-CM | POA: Insufficient documentation

## 2020-08-21 DIAGNOSIS — Z7982 Long term (current) use of aspirin: Secondary | ICD-10-CM | POA: Insufficient documentation

## 2020-08-21 DIAGNOSIS — F17210 Nicotine dependence, cigarettes, uncomplicated: Secondary | ICD-10-CM | POA: Insufficient documentation

## 2020-08-21 DIAGNOSIS — R718 Other abnormality of red blood cells: Secondary | ICD-10-CM | POA: Insufficient documentation

## 2020-08-21 DIAGNOSIS — Z79899 Other long term (current) drug therapy: Secondary | ICD-10-CM | POA: Insufficient documentation

## 2020-08-21 DIAGNOSIS — Z139 Encounter for screening, unspecified: Secondary | ICD-10-CM | POA: Insufficient documentation

## 2020-08-21 DIAGNOSIS — I10 Essential (primary) hypertension: Secondary | ICD-10-CM | POA: Insufficient documentation

## 2020-08-21 DIAGNOSIS — E119 Type 2 diabetes mellitus without complications: Secondary | ICD-10-CM | POA: Insufficient documentation

## 2020-08-21 LAB — HEMOGLOBIN A1C
Hgb A1c MFr Bld: 9.1 % — ABNORMAL HIGH (ref 4.8–5.6)
Mean Plasma Glucose: 214.47 mg/dL

## 2020-08-21 NOTE — ED Provider Notes (Signed)
[facility] Emergency Department Provider Note ____________________________________________   Event Date/Time   First MD Initiated Contact with Patient 08/21/20 1435     (approximate)  I have reviewed the triage vital signs and the nursing notes.  HISTORY  Chief Complaint Labs Only   HPI Henry Anderson is a 55 y.o. malewho presents to the ED for evaluation of hemoglobin A1c.  Chart review indicates history of DM on Anderson.  Patient presents at the direction of his employer and PCP for evaluation of his hemoglobin A1c for his workplace.  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 Denies recent illnesses or medical complaints.  Reports he feels well.  Past Medical History:  Diagnosis Date   Diabetes mellitus without complication (HCC)    Emphysema of lung (HCC)    Hypertension     There are no problems to display for this patient.   Past Surgical History:  Procedure Laterality Date   FOOT SURGERY Right    KNEE SURGERY Right    SHOULDER SURGERY Left     Prior to Admission medications   Medication Sig Start Date End Date Taking? Authorizing Provider  aspirin EC 81 MG tablet Take 81 mg by mouth daily.    [provider]  fluticasone (FLONASE) 50 MCG/ACT nasal spray Place 2 sprays into both nostrils daily. 09/22/17   Tommie Sams, DO  lisinopril-hydrochlorothiazide (ZESTORETIC) 20-25 MG tablet Take 1 tablet by mouth daily. 09/07/19   Fisher, Roselyn Bering, PA-C  meloxicam (MOBIC) 15 MG tablet Take 1 tablet (15 mg total) by mouth daily. 09/07/19 09/06/20  Faythe Ghee, PA-C  Anderson (GLUCOPHAGE) 1000 MG tablet Take 1 tablet (1,000 mg total) by mouth 2 (two) times daily with a meal. 09/07/19   Fisher, Roselyn Bering, PA-C  traMADol (ULTRAM) 50 MG tablet Take 1 tablet (50 mg total) by mouth every 6 (six) hours as needed. 09/07/19    Faythe Ghee, PA-C    Allergies Patient has no known allergies.  Family History  Problem Relation Age of Onset   Diabetes Father    Diabetes Sister    Diabetes Sister     Social History Social History   Tobacco Use   Smoking status: Every Day    Packs/day: 1.50    Years: 30.00    Pack years: 45.00    Types: Cigarettes   Smokeless tobacco: Former    Types: Associate Professor Use: Never used  Substance Use Topics   Alcohol use: Yes    Alcohol/week: 1.0 - 2.0 standard drink    Types: 1 - 2 Cans of beer per week    Comment: occasionally   Drug use: No    Review of Systems  Constitutional: No fever/chills Eyes: No visual changes. ENT: No sore throat. Cardiovascular: Denies chest pain. Respiratory: Denies shortness of breath. Gastrointestinal: No abdominal pain.  No nausea, no vomiting.  No diarrhea.  No constipation. Genitourinary: Negative for dysuria. Musculoskeletal: Negative for back pain. Skin: Negative for rash. Neurological: Negative for headaches, focal weakness or numbness.   ____________________________________________   PHYSICAL EXAM:  VITAL SIGNS: Vitals:   08/21/20 1417  BP: (!) 147/89  Pulse: 95  Resp: 20  Temp: 98 F (36.7 C)  SpO2: 100%      Constitutional: Alert and oriented. Well appearing  and in no acute distress. Eyes: Conjunctivae are normal. PERRL. EOMI. Head: Atraumatic. Nose: No congestion/rhinnorhea. Mouth/Throat: Mucous membranes are moist.  Oropharynx non-erythematous. Neck: No stridor. No cervical spine tenderness to palpation. Cardiovascular: Normal rate, regular rhythm. Good peripheral circulation. Respiratory: Normal respiratory effort.  No retractions.  Gastrointestinal: Soft , nondistended, nontender to palpation. No CVA tenderness. Musculoskeletal: No lower extremity tenderness nor edema.  No joint effusions. No signs of acute trauma. Neurologic:  Normal speech and language. No gross focal neurologic  deficits are appreciated. No gait instability noted. Skin:  Skin is warm, dry and intact. No rash noted. Psychiatric: Mood and affect are normal. Speech and behavior are normal.  ____________________________________________   LABS (all labs ordered are listed, but only abnormal results are displayed)  Labs Reviewed  HEMOGLOBIN A1C   ____________________________________________  12 Lead EKG   ____________________________________________  RADIOLOGY  ED MD interpretation:    Official radiology report(s): No results found.  ____________________________________________   PROCEDURES and INTERVENTIONS  Procedure(s) performed (including Critical Care):  Procedures  Medications - No data to display  ____________________________________________   MDM / ED COURSE   Patient here for blood draw.  Hemoglobin A1c was sent.  No indications for additional diagnostics.  He looks well.       ____________________________________________   FINAL CLINICAL IMPRESSION(S) / ED DIAGNOSES  Final diagnoses:  Encounter for medical screening examination     ED Discharge Orders     None        Lavella Myren   Note:  This document was prepared using Dragon voice recognition software and may include unintentional dictation errors.    Delton Prairie, MD 08/21/20 [phone]

## 2020-08-21 NOTE — ED Triage Notes (Signed)
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

## 2020-10-11 ENCOUNTER — Emergency Department
Admission: EM | Admit: 2020-10-11 | Discharge: 2020-10-11 | Disposition: A | Discharge status: Home / Self Care | Payer: Self-pay | Attending: Emergency Medicine | Admitting: Emergency Medicine

## 2020-10-11 ENCOUNTER — Emergency Department: Payer: Self-pay

## 2020-10-11 ENCOUNTER — Other Ambulatory Visit: Payer: Self-pay

## 2020-10-11 DIAGNOSIS — R739 Hyperglycemia, unspecified: Secondary | ICD-10-CM

## 2020-10-11 DIAGNOSIS — I10 Essential (primary) hypertension: Secondary | ICD-10-CM

## 2020-10-11 DIAGNOSIS — Z79899 Other long term (current) drug therapy: Secondary | ICD-10-CM | POA: Insufficient documentation

## 2020-10-11 DIAGNOSIS — F17210 Nicotine dependence, cigarettes, uncomplicated: Secondary | ICD-10-CM | POA: Insufficient documentation

## 2020-10-11 DIAGNOSIS — Z7982 Long term (current) use of aspirin: Secondary | ICD-10-CM | POA: Insufficient documentation

## 2020-10-11 DIAGNOSIS — Z7984 Long term (current) use of oral hypoglycemic drugs: Secondary | ICD-10-CM | POA: Insufficient documentation

## 2020-10-11 DIAGNOSIS — E1165 Type 2 diabetes mellitus with hyperglycemia: Secondary | ICD-10-CM | POA: Insufficient documentation

## 2020-10-11 LAB — CBC
HCT: 45.5 % (ref 39.0–52.0)
Hemoglobin: 16.1 g/dL (ref 13.0–17.0)
MCH: 31.1 pg (ref 26.0–34.0)
MCHC: 35.4 g/dL (ref 30.0–36.0)
MCV: 88 fL (ref 80.0–100.0)
Platelets: 219 10*3/uL (ref 150–400)
RBC: 5.17 MIL/uL (ref 4.22–5.81)
RDW: 12.4 % (ref 11.5–15.5)
WBC: 9.6 10*3/uL (ref 4.0–10.5)
nRBC: 0 % (ref 0.0–0.2)

## 2020-10-11 LAB — BASIC METABOLIC PANEL
Anion gap: 14 (ref 5–15)
BUN: 11 mg/dL (ref 6–20)
CO2: 25 mmol/L (ref 22–32)
Calcium: 9.2 mg/dL (ref 8.9–10.3)
Chloride: 98 mmol/L (ref 98–111)
Creatinine, Ser: 0.77 mg/dL (ref 0.61–1.24)
GFR, Estimated: >60 mL/min (ref >60)
Glucose, Bld: 343 mg/dL — ABNORMAL HIGH (ref 70–99)
Potassium: 4 mmol/L (ref 3.5–5.1)
Sodium: 137 mmol/L (ref 135–145)

## 2020-10-11 LAB — CBG MONITORING, ED: Glucose-Capillary: 162 mg/dL — ABNORMAL HIGH (ref 70–99)

## 2020-10-11 MED ORDER — LISINOPRIL-HYDROCHLOROTHIAZIDE 20-25 MG PO TABS: 1.0000 | ORAL_TABLET | Freq: Every day | ORAL | 1 refills | Status: DC

## 2020-10-11 MED ORDER — METFORMIN HCL 1000 MG PO TABS: 1000.0000 mg | ORAL_TABLET | Freq: Two times a day (BID) | ORAL | 1 refills | Status: DC

## 2020-10-11 MED ORDER — SODIUM CHLORIDE 0.9 % IV BOLUS
1000.0000 mL | Freq: Once | INTRAVENOUS | Status: AC
  Administered 2020-10-11: 1000 mL via INTRAVENOUS

## 2020-10-11 NOTE — ED Triage Notes (Signed)
Pt comes with c/o HTN and dizziness. Pt states he hasn't been taking meds due to running out awhile back. Pt states he takes aspirin a day.  Pt states some blurry vision.

## 2020-10-11 NOTE — Discharge Instructions (Addendum)
Make arrangements to follow-up with a primary care doctor as soon as you get your insurance back.  Take the metformin and the lisinopril-hydrochlorothiazide as prescribed.  We have given you a 1 month prescription with 1 refill.  Return to the emergency department for persistent high blood pressure readings especially over 180 on the top number or 100 on the bottom number, chest pain, severe headache, difficulty breathing, or any worsening or recurrent blurred vision, dizziness or feel like you are falling to 1 side, difficulty with speech, weakness or numbness, or any other new or worsening symptoms that concern you.

## 2020-10-11 NOTE — ED Provider Notes (Signed)
[facility] Emergency Department Provider Note ____________________________________________   Event Date/Time   First MD Initiated Contact with Patient 10/11/20 1130     (approximate)  I have reviewed the triage vital signs and the nursing notes.   HISTORY  Chief Complaint Hypertension    HPI Henry Anderson is a 55 y.o. male with history of diabetes hypertension who presents with elevated blood pressures over the last several days, measured at home to the 170s systolic.  The patient reports mild lightheadedness intermittently but denies chest pain, headache, or difficulty breathing.  He does report slightly blurred vision over the last few days which resolves if he covers either eye.  He denies any difficulty with speech and has no numbness or weakness in his extremities.  Sometimes when he walks he feels like he is leaning towards the left.  The patient states he is supposed to be on blood pressure and diabetes medicine, but he lost his job and his insurance, so has not been on it for a while.  He just got a new job and is waiting for his new insurance to kick in.   Past Medical History:  Diagnosis Date   Diabetes mellitus without complication (HCC)    Emphysema of lung (HCC)    Hypertension     There are no problems to display for this patient.   Past Surgical History:  Procedure Laterality Date   FOOT SURGERY Right    KNEE SURGERY Right    SHOULDER SURGERY Left     Prior to Admission medications   Medication Sig Start Date End Date Taking? Authorizing Provider  aspirin EC 81 MG tablet Take 81 mg by mouth daily.    [provider]  fluticasone (FLONASE) 50 MCG/ACT nasal spray Place 2 sprays into both nostrils daily. 09/22/17   Tommie Sams, DO  lisinopril-hydrochlorothiazide (ZESTORETIC) 20-25 MG tablet Take 1 tablet by mouth daily. 10/11/20   Dionne Bucy, MD  metFORMIN (GLUCOPHAGE) 1000 MG tablet Take 1 tablet (1,000 mg total) by  mouth 2 (two) times daily with a meal. 10/11/20   Dionne Bucy, MD    Allergies Patient has no known allergies.  Family History  Problem Relation Age of Onset   Diabetes Father    Diabetes Sister    Diabetes Sister     Social History Social History   Tobacco Use   Smoking status: Every Day    Packs/day: 1.50    Years: 30.00    Pack years: 45.00    Types: Cigarettes   Smokeless tobacco: Former    Types: Associate Professor Use: Never used  Substance Use Topics   Alcohol use: Yes    Alcohol/week: 1.0 - 2.0 standard drink    Types: 1 - 2 Cans of beer per week    Comment: occasionally   Drug use: No    Review of Systems  Constitutional: No fever/chills Eyes: Positive for blurred vision. ENT: No sore throat. Cardiovascular: Denies chest pain. Respiratory: Denies shortness of breath. Gastrointestinal: No vomiting or diarrhea.  Genitourinary: Negative for dysuria.  Musculoskeletal: Negative for back pain. Skin: Negative for rash. Neurological: Negative for headaches, focal weakness or numbness.   ____________________________________________   PHYSICAL EXAM:  VITAL SIGNS: ED Triage Vitals [10/11/20 1006]  Enc Vitals Group     BP (!) 191/96     Pulse Rate 93     Resp 18     Temp 98 F (36.7 C)  Temp src      SpO2 100 %     Weight      Height      Head Circumference      Peak Flow      Pain Score 0     Pain Loc      Pain Edu?      Excl. in GC?     Constitutional: Alert and oriented. Well appearing and in no acute distress. Eyes: Conjunctivae are normal.  EOMI.  PERRLA. Head: Atraumatic. Nose: No congestion/rhinnorhea. Mouth/Throat: Mucous membranes are moist.   Neck: Normal range of motion.  Cardiovascular: Normal rate, regular rhythm. Grossly normal heart sounds.  Good peripheral circulation. Respiratory: Normal respiratory effort.  No retractions. Lungs CTAB. Gastrointestinal: No distention.  Musculoskeletal: No lower extremity  edema.  Extremities warm and well perfused.  Neurologic:  Normal speech and language.  5/5 motor strength and intact sensation to all extremities.  No facial droop.  No pronator drift.  Normal coordination with no ataxia on finger-to-nose. Skin:  Skin is warm and dry. No rash noted. Psychiatric: Mood and affect are normal. Speech and behavior are normal.  ____________________________________________   LABS (all labs ordered are listed, but only abnormal results are displayed)  Labs Reviewed  BASIC METABOLIC PANEL - Abnormal; Notable for the following components:      Result Value   Glucose, Bld 343 (*)    All other components within normal limits  CBG MONITORING, ED - Abnormal; Notable for the following components:   Glucose-Capillary 162 (*)    All other components within normal limits  CBC  URINALYSIS, COMPLETE (UACMP) WITH MICROSCOPIC  CBG MONITORING, ED   ____________________________________________  EKG  ED ECG REPORT I, Dionne Bucy, the attending physician, personally viewed and interpreted this ECG.  Date: 10/11/2020 EKG Time: 1011 Rate: 87 Rhythm: normal sinus rhythm QRS Axis: normal Intervals: normal ST/T Wave abnormalities: normal Narrative Interpretation: no evidence of acute ischemia  ____________________________________________  RADIOLOGY  CT head: IMPRESSION:  1. No acute intracranial abnormality.  2. 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  3. Left maxillary sinus disease.    ____________________________________________   PROCEDURES  Procedure(s) performed: No  Procedures  Critical Care performed: No ____________________________________________   INITIAL IMPRESSION / ASSESSMENT AND PLAN / ED COURSE  Pertinent labs & imaging results that were available during my care of the patient were reviewed by me and considered in my medical decision making (see chart for details).   55 year old male with PMH of diabetes  and hypertension presents with elevated blood pressure readings over the last several days.  In addition the patient reports mildly blurred vision bilaterally, as well as a sensation sometimes when walking that he is leaning to the left, but with no weakness, numbness, or other focal neurologic symptoms.  On exam the patient is well-appearing.  His vital signs are normal except for hypertension.  The physical exam is unremarkable.  Thorough neuro exam is normal.  EKG is nonischemic.  Basic labs obtained from triage are unremarkable except for glucose in the 300s but a normal anion gap and no other acute abnormalities.  Overall, the patient has hypertension with no evidence of end organ dysfunction.  I suspect that the mild blurred vision and dizziness (including the sensation of leaning towards the left) are related to the hypoglycemia or possible mild vertigo.  There are no neurologic deficits.  The patient does endorse falling and hitting his head a few days ago,  so I will check a CT to rule out ICH or subacute stroke.  However, my suspicion for acute neurologic etiology is very low.  We will give fluids to treat the hypoglycemia.  If the CT is negative, the patient will be appropriate for discharge home.  I will restart him on metformin and Zestoretic.  ----------------------------------------- 2:30 PM on 10/11/2020 -----------------------------------------  CT head shows no acute abnormalities.  The patient's glucose has normalized.  He is feeling better on reassessment, and is stable for discharge home.  I counseled him on the results of the work-up and the plan of care.  I have prescribed his blood pressure medication and metformin.  The patient states that he will be able to fill these prescriptions now.  He states his insurance should kick back in over the next few days, and he will follow-up with his PMD as soon as he is able to.  I gave the patient very thorough return precautions and he  expressed understanding.  ____________________________________________   FINAL CLINICAL IMPRESSION(S) / ED DIAGNOSES  Final diagnoses:  Hypertension, unspecified type  Hyperglycemia      NEW MEDICATIONS STARTED DURING THIS VISIT:  Current Discharge Medication List       Note:  This document was prepared using Dragon voice recognition software and may include unintentional dictation errors.    Dionne Bucy, MD 10/11/20 1431

## 2020-10-11 NOTE — ED Notes (Signed)
Patient states he has not been on BP medications for 1 year due to loss of insurance. C/O headache, blurred vision and dizziness.

## 2020-10-11 NOTE — ED Provider Notes (Signed)
Emergency Medicine Provider Triage Evaluation Note  Henry Anderson , a 55 y.o. male  was evaluated in triage.  Pt complains of dizziness, visual changes and elevated blood pressure.  Review of Systems  Positive: Blurry vision and dizziness Negative: Headache, syncope, chest pain, shortness of breath or lower extremity edema  Physical Exam  BP (!) 191/96   Pulse 93   Temp 98 F (36.7 C)   Resp 18   SpO2 100%  Gen:   Awake, anxious appearing but in no distress   Resp:  Normal effort  CV:  Normal rate and rhythm, no appreciable edema   Medical Decision Making  Medically screening exam initiated at 10:11 AM.  Appropriate orders placed.  Elissa Hefty was informed that the remainder of the evaluation will be completed by another provider, this initial triage assessment does not replace that evaluation, and the importance of remaining in the ED until their evaluation is complete.     Lorre Munroe, NP 10/11/20 1012    Gilles Chiquito, MD 10/11/20 [phone]

## 2020-10-11 NOTE — ED Notes (Signed)
Patient given discharge instructions, all questions answered. Patient in possession of all belongings, directed to the discharge area  

## 2020-10-13 ENCOUNTER — Inpatient Hospital Stay
Admission: EM | Admit: 2020-10-13 | Discharge: 2020-10-14 | DRG: 066 | Disposition: A | Discharge status: Home Health | Payer: Self-pay | Attending: Internal Medicine | Admitting: Internal Medicine

## 2020-10-13 ENCOUNTER — Other Ambulatory Visit: Payer: Self-pay

## 2020-10-13 ENCOUNTER — Emergency Department: Payer: Medicaid Other

## 2020-10-13 ENCOUNTER — Inpatient Hospital Stay
Admit: 2020-10-13 | Discharge: 2020-10-13 | Disposition: A | Discharge status: Home / Self Care | Payer: Medicaid Other | Attending: Internal Medicine | Admitting: Internal Medicine

## 2020-10-13 DIAGNOSIS — R1310 Dysphagia, unspecified: Secondary | ICD-10-CM | POA: Diagnosis present

## 2020-10-13 DIAGNOSIS — Z79899 Other long term (current) drug therapy: Secondary | ICD-10-CM

## 2020-10-13 DIAGNOSIS — E1165 Type 2 diabetes mellitus with hyperglycemia: Secondary | ICD-10-CM

## 2020-10-13 DIAGNOSIS — E1142 Type 2 diabetes mellitus with diabetic polyneuropathy: Secondary | ICD-10-CM

## 2020-10-13 DIAGNOSIS — H538 Other visual disturbances: Secondary | ICD-10-CM

## 2020-10-13 DIAGNOSIS — Z716 Tobacco abuse counseling: Secondary | ICD-10-CM

## 2020-10-13 DIAGNOSIS — Z7982 Long term (current) use of aspirin: Secondary | ICD-10-CM

## 2020-10-13 DIAGNOSIS — Z833 Family history of diabetes mellitus: Secondary | ICD-10-CM

## 2020-10-13 DIAGNOSIS — I10 Essential (primary) hypertension: Secondary | ICD-10-CM | POA: Diagnosis present

## 2020-10-13 DIAGNOSIS — I639 Cerebral infarction, unspecified: Principal | ICD-10-CM | POA: Diagnosis present

## 2020-10-13 DIAGNOSIS — E785 Hyperlipidemia, unspecified: Secondary | ICD-10-CM | POA: Diagnosis present

## 2020-10-13 DIAGNOSIS — Z7984 Long term (current) use of oral hypoglycemic drugs: Secondary | ICD-10-CM

## 2020-10-13 DIAGNOSIS — F17210 Nicotine dependence, cigarettes, uncomplicated: Secondary | ICD-10-CM | POA: Diagnosis present

## 2020-10-13 DIAGNOSIS — H510 Palsy (spasm) of conjugate gaze: Secondary | ICD-10-CM | POA: Diagnosis present

## 2020-10-13 DIAGNOSIS — J439 Emphysema, unspecified: Secondary | ICD-10-CM | POA: Diagnosis present

## 2020-10-13 DIAGNOSIS — R29701 NIHSS score 1: Secondary | ICD-10-CM | POA: Diagnosis present

## 2020-10-13 DIAGNOSIS — Z560 Unemployment, unspecified: Secondary | ICD-10-CM

## 2020-10-13 DIAGNOSIS — Z20822 Contact with and (suspected) exposure to covid-19: Secondary | ICD-10-CM | POA: Diagnosis present

## 2020-10-13 DIAGNOSIS — Z720 Tobacco use: Secondary | ICD-10-CM | POA: Diagnosis present

## 2020-10-13 DIAGNOSIS — I6601 Occlusion and stenosis of right middle cerebral artery: Secondary | ICD-10-CM | POA: Diagnosis present

## 2020-10-13 DIAGNOSIS — Z91120 Patient's intentional underdosing of medication regimen due to financial hardship: Secondary | ICD-10-CM

## 2020-10-13 DIAGNOSIS — W19XXXA Unspecified fall, initial encounter: Secondary | ICD-10-CM | POA: Diagnosis present

## 2020-10-13 DIAGNOSIS — E119 Type 2 diabetes mellitus without complications: Secondary | ICD-10-CM | POA: Diagnosis present

## 2020-10-13 DIAGNOSIS — I672 Cerebral atherosclerosis: Secondary | ICD-10-CM | POA: Diagnosis present

## 2020-10-13 LAB — URINALYSIS, COMPLETE (UACMP) WITH MICROSCOPIC
Bacteria, UA: NONE SEEN
Bilirubin Urine: NEGATIVE
Glucose, UA: >=500 mg/dL — AB
Hgb urine dipstick: NEGATIVE
Ketones, ur: NEGATIVE mg/dL
Leukocytes,Ua: NEGATIVE
Nitrite: NEGATIVE
Protein, ur: NEGATIVE mg/dL
Specific Gravity, Urine: 1.03 (ref 1.005–1.030)
pH: 5 (ref 5.0–8.0)

## 2020-10-13 LAB — BASIC METABOLIC PANEL
Anion gap: 9 (ref 5–15)
BUN: 11 mg/dL (ref 6–20)
CO2: 28 mmol/L (ref 22–32)
Calcium: 9.8 mg/dL (ref 8.9–10.3)
Chloride: 96 mmol/L — ABNORMAL LOW (ref 98–111)
Creatinine, Ser: 0.74 mg/dL (ref 0.61–1.24)
GFR, Estimated: >60 mL/min (ref >60)
Glucose, Bld: 270 mg/dL — ABNORMAL HIGH (ref 70–99)
Potassium: 3.9 mmol/L (ref 3.5–5.1)
Sodium: 133 mmol/L — ABNORMAL LOW (ref 135–145)

## 2020-10-13 LAB — CBC WITH DIFFERENTIAL/PLATELET
Abs Immature Granulocytes: 0.07 10*3/uL (ref 0.00–0.07)
Basophils Absolute: 0.1 10*3/uL (ref 0.0–0.1)
Basophils Relative: 1 %
Eosinophils Absolute: 0.2 10*3/uL (ref 0.0–0.5)
Eosinophils Relative: 1 %
HCT: 47.7 % (ref 39.0–52.0)
Hemoglobin: 17.1 g/dL — ABNORMAL HIGH (ref 13.0–17.0)
Immature Granulocytes: 1 %
Lymphocytes Relative: 21 %
Lymphs Abs: 2.6 10*3/uL (ref 0.7–4.0)
MCH: 30.7 pg (ref 26.0–34.0)
MCHC: 35.8 g/dL (ref 30.0–36.0)
MCV: 85.6 fL (ref 80.0–100.0)
Monocytes Absolute: 0.7 10*3/uL (ref 0.1–1.0)
Monocytes Relative: 6 %
Neutro Abs: 8.9 10*3/uL — ABNORMAL HIGH (ref 1.7–7.7)
Neutrophils Relative %: 70 %
Platelets: 279 10*3/uL (ref 150–400)
RBC: 5.57 MIL/uL (ref 4.22–5.81)
RDW: 12.4 % (ref 11.5–15.5)
WBC: 12.5 10*3/uL — ABNORMAL HIGH (ref 4.0–10.5)
nRBC: 0 % (ref 0.0–0.2)

## 2020-10-13 LAB — URINE DRUG SCREEN, QUALITATIVE (ARMC ONLY)
Amphetamines, Ur Screen: NOT DETECTED
Barbiturates, Ur Screen: NOT DETECTED
Benzodiazepine, Ur Scrn: NOT DETECTED
Cannabinoid 50 Ng, Ur ~~LOC~~: NOT DETECTED
Cocaine Metabolite,Ur ~~LOC~~: NOT DETECTED
MDMA (Ecstasy)Ur Screen: NOT DETECTED
Methadone Scn, Ur: NOT DETECTED
Opiate, Ur Screen: NOT DETECTED
Phencyclidine (PCP) Ur S: NOT DETECTED
Tricyclic, Ur Screen: NOT DETECTED

## 2020-10-13 LAB — RESP PANEL BY RT-PCR (FLU A&B, COVID) ARPGX2
Influenza A by PCR: NEGATIVE
Influenza B by PCR: NEGATIVE
SARS Coronavirus 2 by RT PCR: NEGATIVE

## 2020-10-13 IMAGING — MR MR HEAD WO/W CM
13 series · 43 of 48 positions shown · IV contrast (gadavist)
Comparison: Head CT [DATE].

CLINICAL DATA: Neuro deficit, acute, stroke suspected. Additional
history provided by scanning technologist: Patient reports blurred
vision and bilateral eye pain which began [REDACTED].

EXAM:
MRI HEAD WITHOUT AND WITH CONTRAST
MRA HEAD WITHOUT CONTRAST
TECHNIQUE: Multiplanar, multi-echo pulse sequences of the brain and surrounding
structures were acquired without and with intravenous contrast.
Angiographic images of the Circle of Willis were acquired using MRA
technique without intravenous contrast.
CONTRAST:  7.5mL GADAVIST GADOBUTROL 1 MMOL/ML IV SOLN

[Series 9: ax dwi_tracew · axial · 3.0mm · 0.65mm/px · z∈[-63,+90]mm · 4 of 48 slices shown]
[im 1/48]
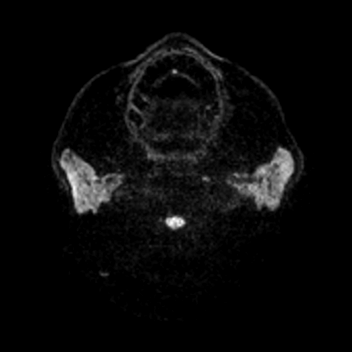
[im 16/48]
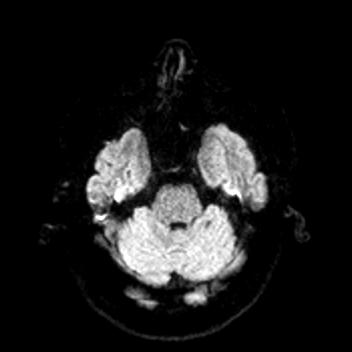
[im 32/48]
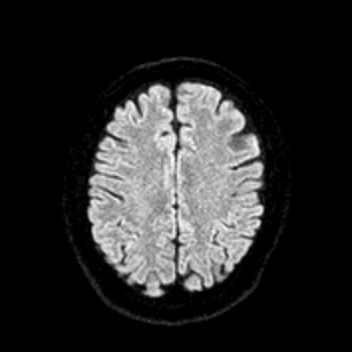
[im 48/48]
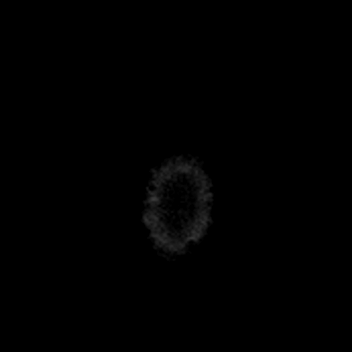

[Series 10: ax dwi_adc · axial · 3.0mm · 0.65mm/px · z∈[-63,+90]mm · 3 of 48 slices shown]
[im 1/48]
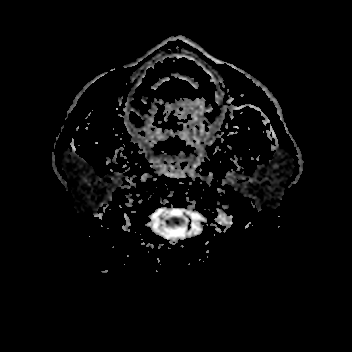
[im 24/48]
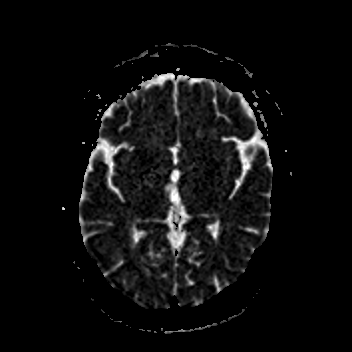
[im 48/48]
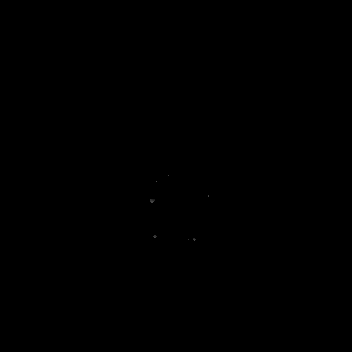

[Series 11: cor dwi_tracew · coronal · 5.0mm · 0.60mm/px · 2 of 38 slices shown]
[im 1/38]
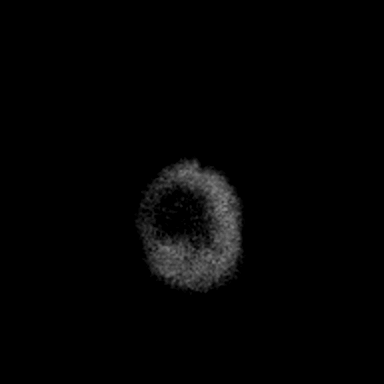
[im 38/38]
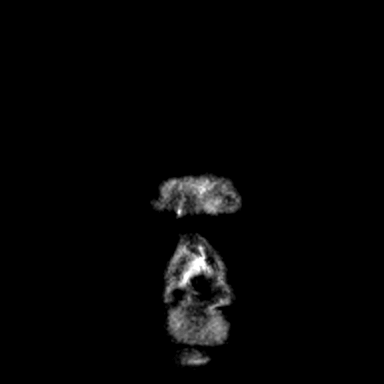

[Series 12: cor dwi_adc · coronal · 5.0mm · 0.60mm/px · 2 of 38 slices shown]
[im 1/38]
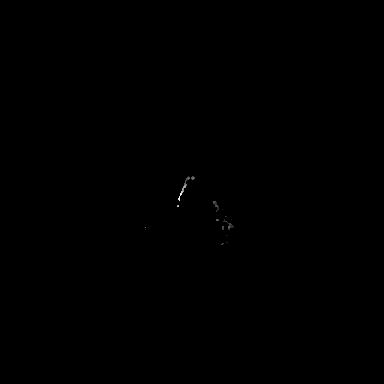
[im 38/38]
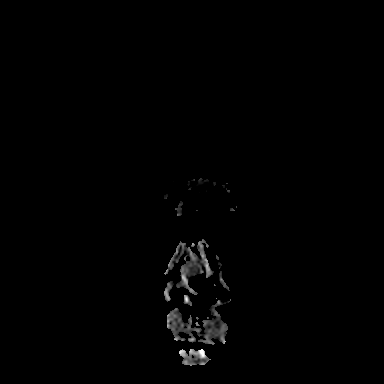

[Series 13: T1 · sagittal · 5.0mm · 0.62mm/px · 1 of 23 slices shown (1 of 2)]
[im 1/23]
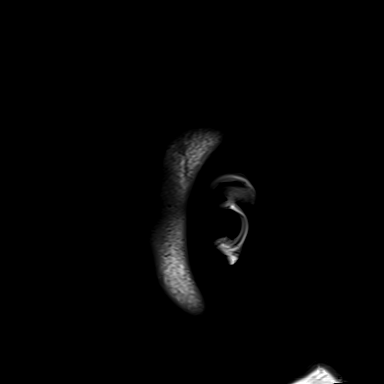

[Series 14: T2 · axial · 5.0mm · 0.53mm/px · 1 of 25 slices shown]
[im 1/25]
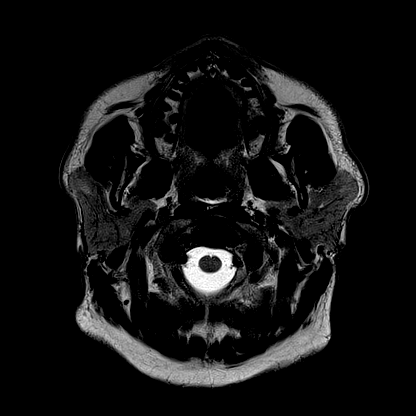

[Series 16: pha_images · axial · 3.0mm · 0.90mm/px · z∈[-74,+100]mm · 4 of 60 slices shown]
[im 1/60]
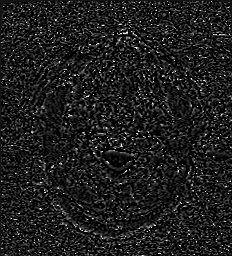
[im 20/60]
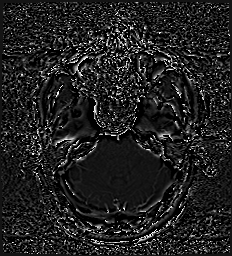
[im 40/60]
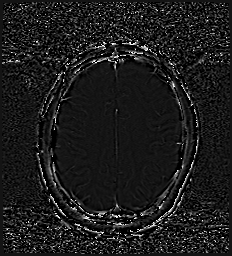
[im 60/60]
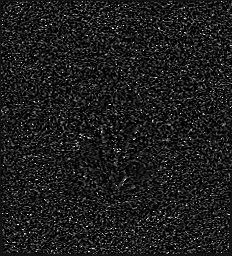

[Series 17: swi_images · axial · 3.0mm · 0.90mm/px · z∈[-74,+41]mm · 3 of 60 slices shown]
[im 1/60]
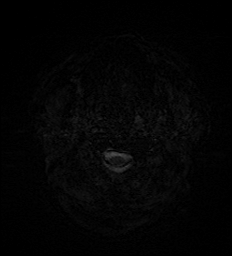
[im 20/60]
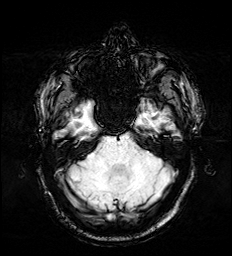
[im 40/60]
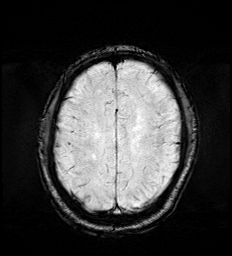

[Series 19: FLAIR · axial · 3.0mm · 0.53mm/px · z∈[-68,+92]mm · 3 of 55 slices shown]
[im 1/55]
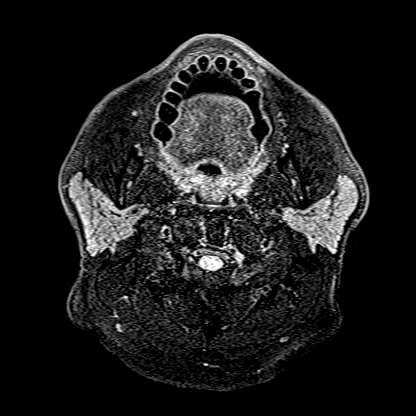
[im 28/55]
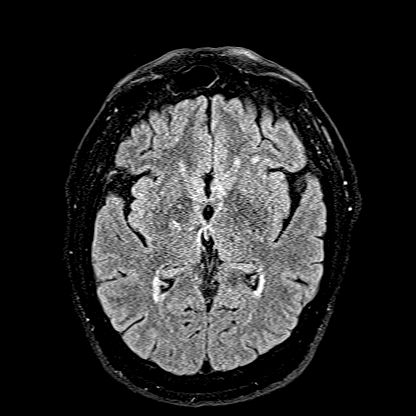
[im 55/55]
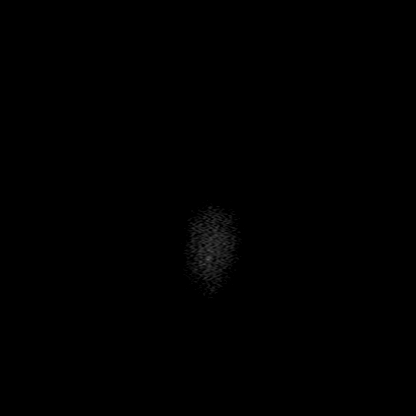

[Series 20: T1 · axial · 1.0mm · 0.98mm/px · z∈[-69,+103]mm · 8 of 176 slices shown (2 of 2)]
[im 1/176]
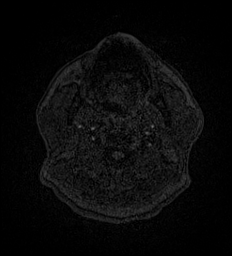
[im 20/176]
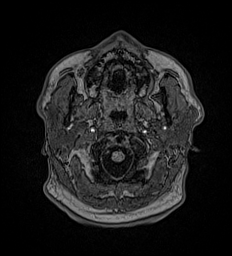
[im 59/176]
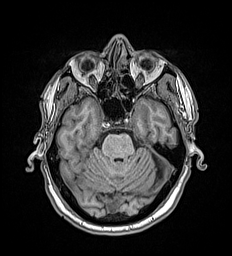
[im 78/176]
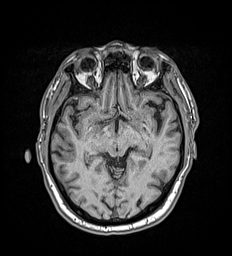
[im 98/176]
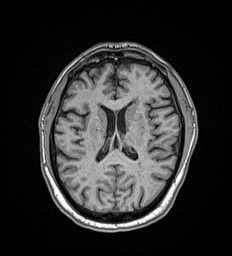
[im 117/176]
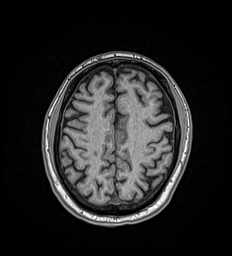
[im 156/176]
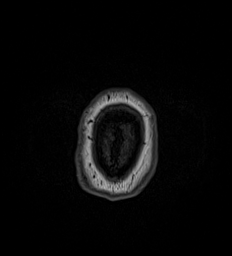
[im 176/176]
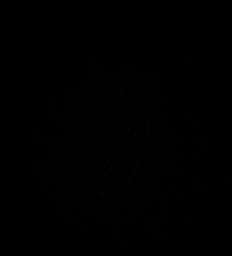

[Series 21: T2 post-contrast · coronal · 5.0mm · 0.57mm/px · 2 of 29 slices shown]
[im 1/29]
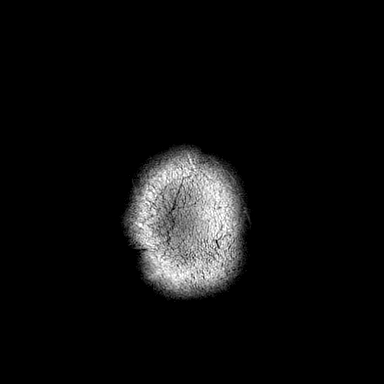
[im 29/29]
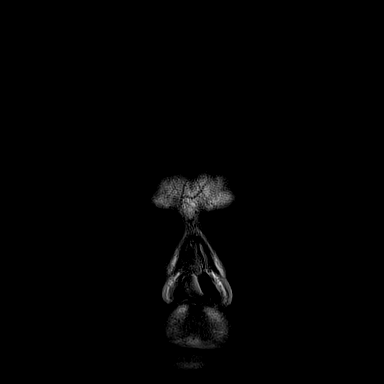

[Series 22: T1 post-contrast · axial · 1.0mm · 0.98mm/px · z∈[-69,+103]mm · 8 of 176 slices shown (1 of 2)]
[im 1/176]
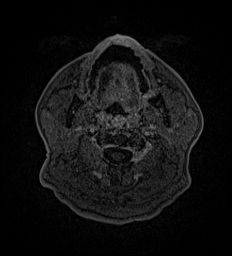
[im 20/176]
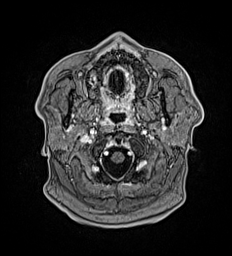
[im 59/176]
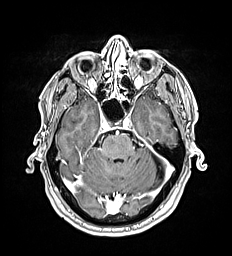
[im 78/176]
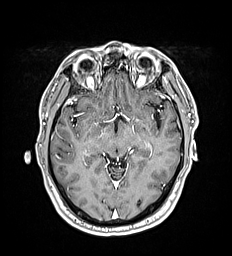
[im 98/176]
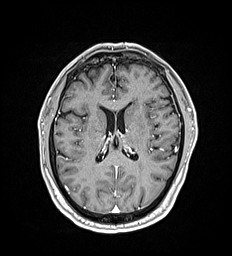
[im 117/176]
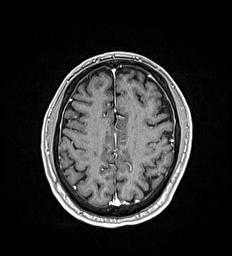
[im 156/176]
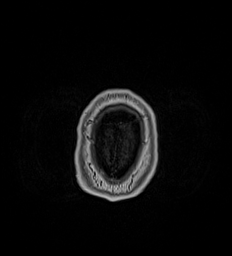
[im 176/176]
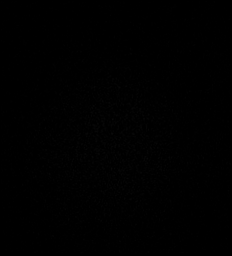

[Series 23: T1 post-contrast · coronal · 5.0mm · 0.57mm/px · 2 of 29 slices shown (2 of 2)]
[im 1/29]
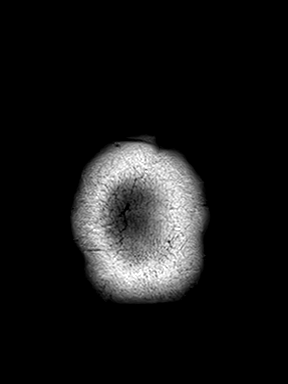
[im 29/29]
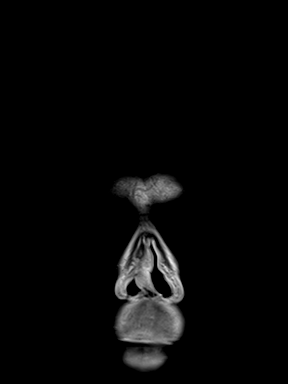

[43 of 48 positions shown; findings below may reference images not displayed]

FINDINGS: MRI HEAD FINDINGS

Brain:

Cerebral volume is normal for age.

7 mm acute/early subacute infarct within the medial right midbrain
and medial right thalamus (series 9, image 22) (series 11, image
18).

Chronic small-vessel infarcts within the posterior limb of right
internal capsule and right thalamus.

Background multifocal T2 FLAIR hyperintense signal abnormality
within the cerebral white matter, overall mild but advanced for age.
The signal changes are nonspecific, but compatible chronic small
vessel ischemic disease. Mild chronic small-vessel ischemic changes
are also present within the pons.

No evidence of an intracranial mass.

No chronic intracranial blood products.

No extra-axial fluid collection.

No midline shift.

No pathologic intracranial enhancement identified.

Vascular: Maintained flow voids within the proximal large arterial
vessels. Small right cerebellar developmental venous anomaly
(incidental anatomic variant).

Skull and upper cervical spine: No focal suspicious marrow lesion.

Sinuses/Orbits: Visualized orbits show no acute finding. Fluid level
and mild-to-moderate mucosal thickening within an asymmetrically
diminutive left maxillary sinus. Trace mucosal thickening within the
bilateral sphenoid sinuses. Mild mucosal thickening within the
bilateral ethmoid air cells. Trace mucosal thickening within the
right frontal sinus.

Other: Bilateral mastoid effusions.

MRA HEAD FINDINGS

Anterior circulation:

The intracranial internal carotid arteries are patent. The M1 middle
cerebral arteries are patent. No M2 proximal branch occlusion is
identified. Atherosclerotic irregularity of the M2 and more distal
middle cerebral artery vessels bilaterally. Most notably, there is a
moderate/severe stenosis within an inferior division right M2 MCA
vessel (series [HA], image 6). The anterior cerebral arteries are
patent. 1 mm inferiorly projecting vascular protrusion arising from
the paraclinoid left ICA, which may reflect an infundibulum or small
aneurysm (series [HA], image 16) (series [HA], image 195).

Posterior circulation:

The intracranial vertebral arteries are patent. The basilar artery
is patent. The posterior cerebral arteries are patent. Moderate
stenosis within the proximal P2 left PCA. Posterior communicating
arteries are hypoplastic or absent bilaterally.

Anatomic variants: As described.
IMPRESSION: MRI brain:

1. 7 mm acute/early subacute infarct within the medial right
midbrain and medial right thalamus.
2. Chronic small-vessel infarcts within the posterior limb of right
internal capsule and right thalamus.
3. Background cerebral white matter chronic small-vessel ischemic
changes, which are overall mild but advanced for age. Mild chronic
small-vessel ischemic changes also present within the pons.
4. Paranasal sinus disease, as described.
5. Bilateral mastoid effusions.

MRA head:

1. No intracranial large vessel occlusion identified.
2. Intracranial atherosclerotic disease, most notably as follows.
3. Moderate/severe stenosis within an inferior division proximal M2
right MCA vessel.
4. Moderate stenosis within the proximal P2 left PCA.
5. 1 mm inferiorly projecting vascular protrusion arising from the
paraclinoid left ICA, which may reflect an infundibulum or small
aneurysm.

## 2020-10-13 IMAGING — MR MR MRA HEAD W/O CM
1 series · 17 of 48 positions shown · IV contrast (gadavist)
Comparison: Head CT [DATE].

CLINICAL DATA: Neuro deficit, acute, stroke suspected. Additional
history provided by scanning technologist: Patient reports blurred
vision and bilateral eye pain which began [REDACTED].

EXAM:
MRI HEAD WITHOUT AND WITH CONTRAST
MRA HEAD WITHOUT CONTRAST
TECHNIQUE: Multiplanar, multi-echo pulse sequences of the brain and surrounding
structures were acquired without and with intravenous contrast.
Angiographic images of the Circle of Willis were acquired using MRA
technique without intravenous contrast.
CONTRAST:  7.5mL GADAVIST GADOBUTROL 1 MMOL/ML IV SOLN

[Series 1: TOF · axial · 0.5mm · 0.41mm/px · z∈[-67,+29]mm · 17 of 205 slices shown]
[im 1/205]
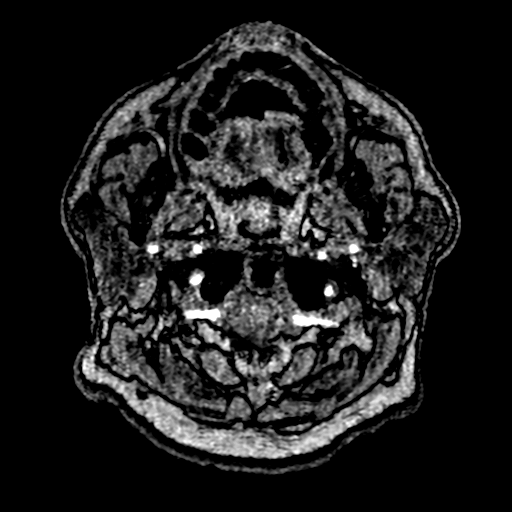
[im 5/205]
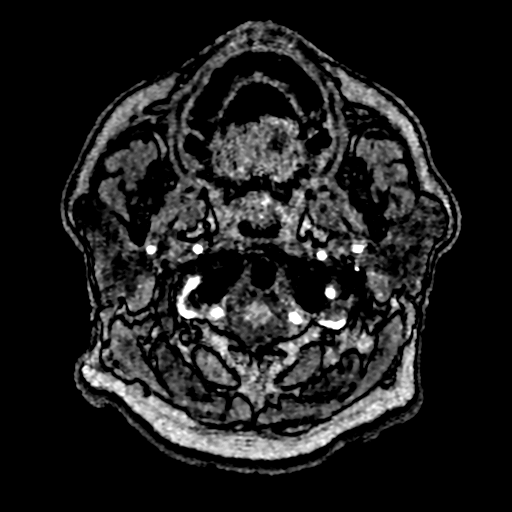
[im 9/205]
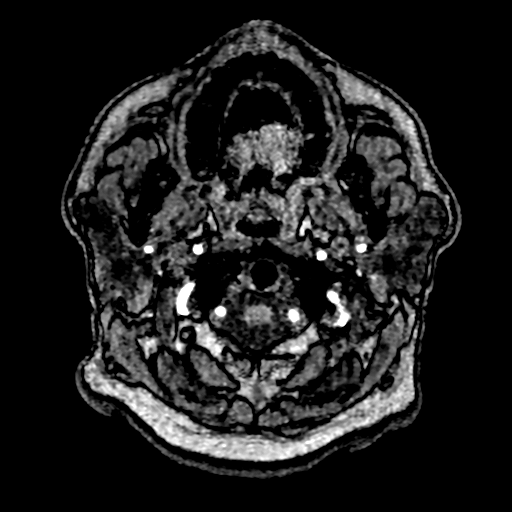
[im 14/205]
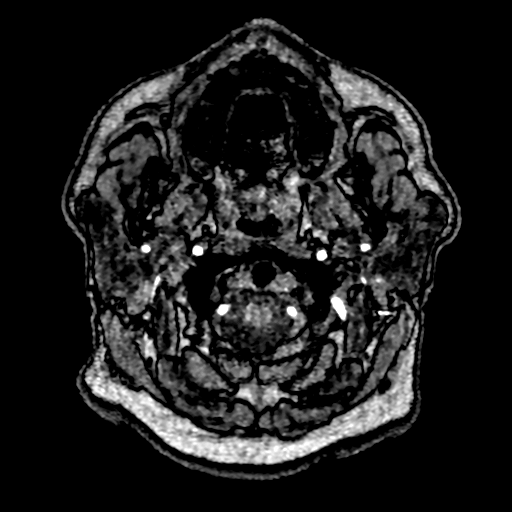
[im 18/205]
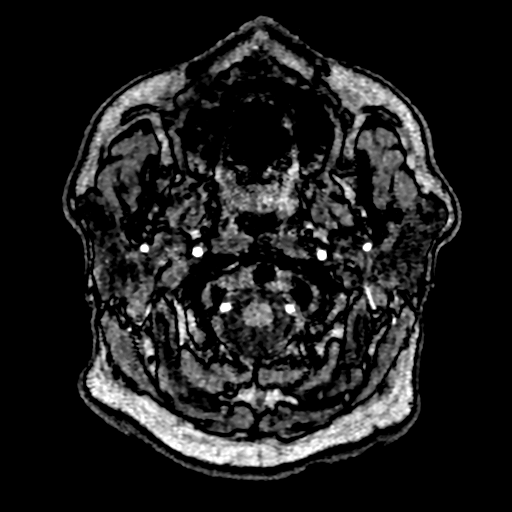
[im 22/205]
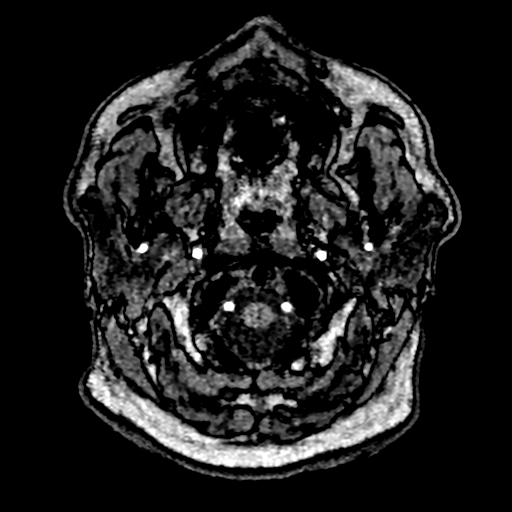
[im 27/205]
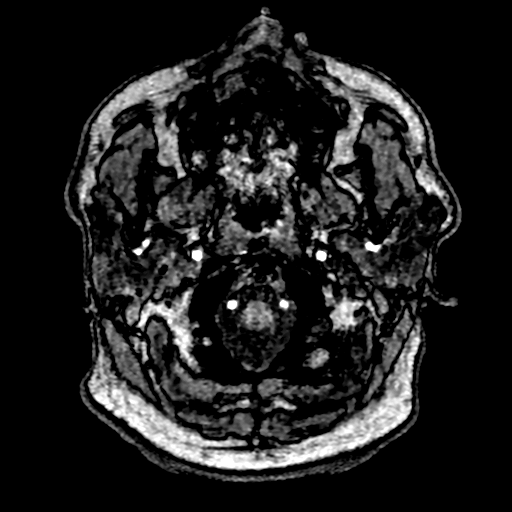
[im 35/205]
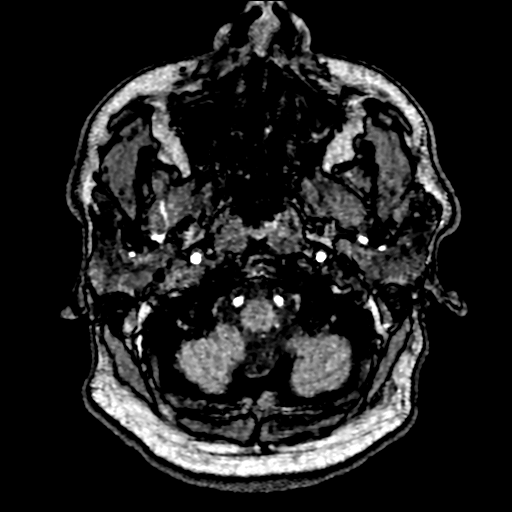
[im 40/205]
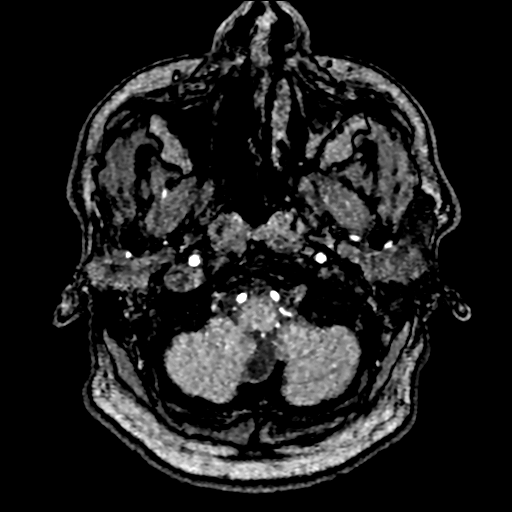
[im 66/205]
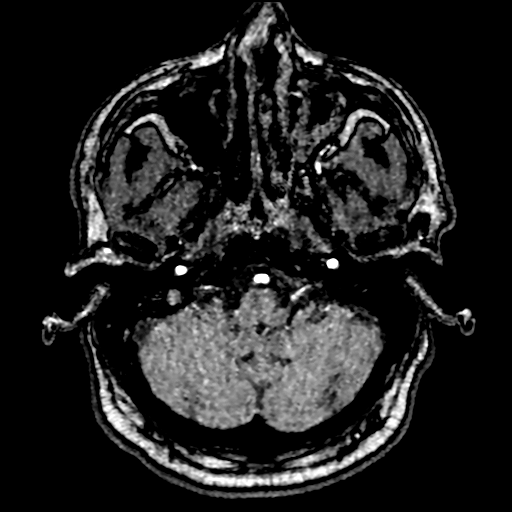
[im 92/205]
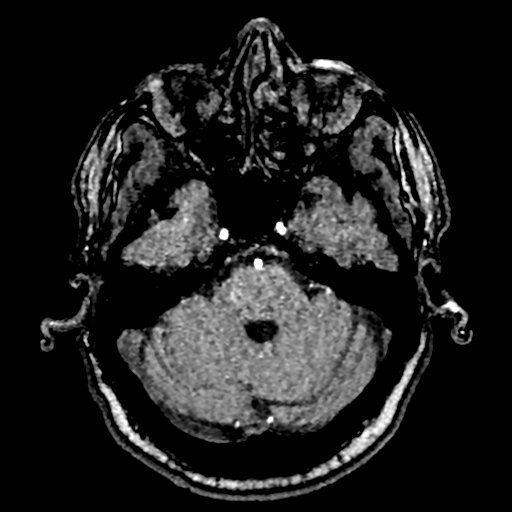
[im 105/205]
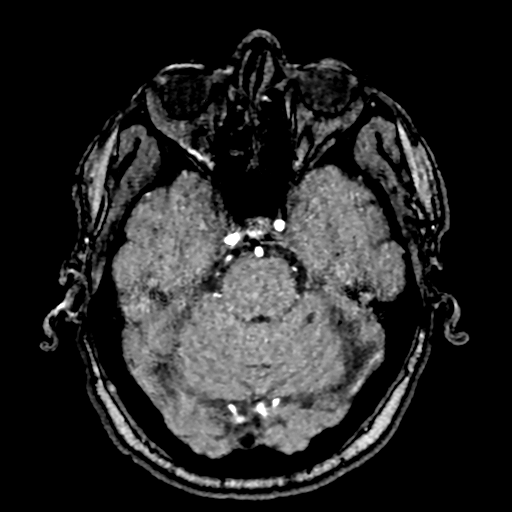
[im 118/205]
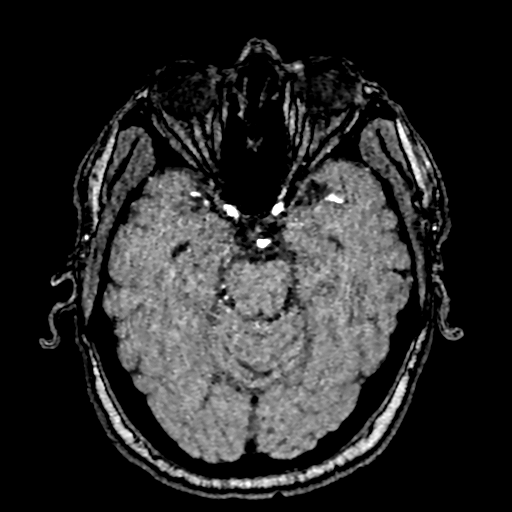
[im 144/205]
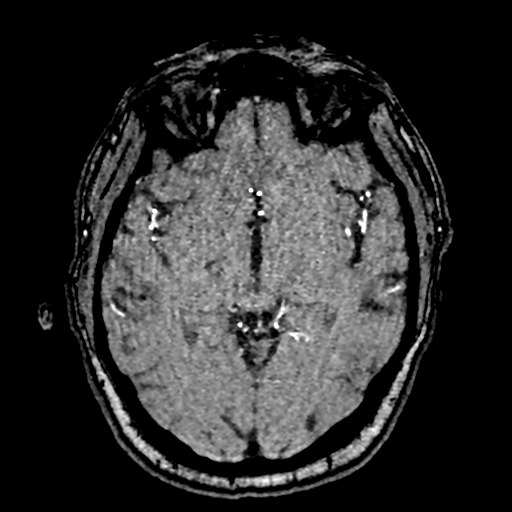
[im 170/205]
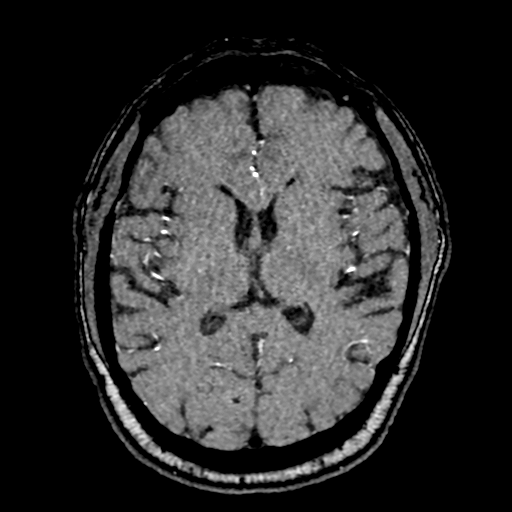
[im 174/205]
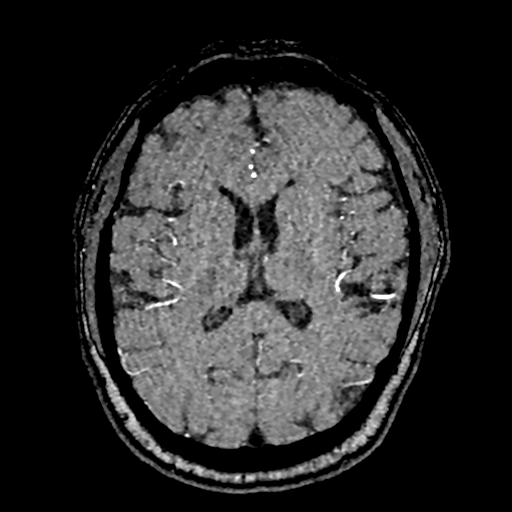
[im 196/205]
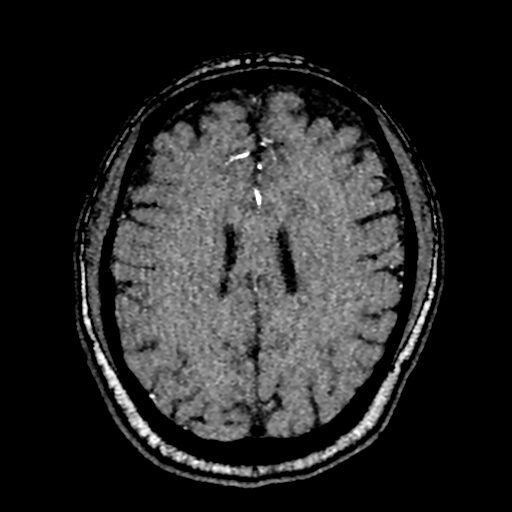

[17 of 48 positions shown; findings below may reference images not displayed]

FINDINGS: MRI HEAD FINDINGS

Brain:

Cerebral volume is normal for age.

7 mm acute/early subacute infarct within the medial right midbrain
and medial right thalamus (series 9, image 22) (series 11, image
18).

Chronic small-vessel infarcts within the posterior limb of right
internal capsule and right thalamus.

Background multifocal T2 FLAIR hyperintense signal abnormality
within the cerebral white matter, overall mild but advanced for age.
The signal changes are nonspecific, but compatible chronic small
vessel ischemic disease. Mild chronic small-vessel ischemic changes
are also present within the pons.

No evidence of an intracranial mass.

No chronic intracranial blood products.

No extra-axial fluid collection.

No midline shift.

No pathologic intracranial enhancement identified.

Vascular: Maintained flow voids within the proximal large arterial
vessels. Small right cerebellar developmental venous anomaly
(incidental anatomic variant).

Skull and upper cervical spine: No focal suspicious marrow lesion.

Sinuses/Orbits: Visualized orbits show no acute finding. Fluid level
and mild-to-moderate mucosal thickening within an asymmetrically
diminutive left maxillary sinus. Trace mucosal thickening within the
bilateral sphenoid sinuses. Mild mucosal thickening within the
bilateral ethmoid air cells. Trace mucosal thickening within the
right frontal sinus.

Other: Bilateral mastoid effusions.

MRA HEAD FINDINGS

Anterior circulation:

The intracranial internal carotid arteries are patent. The M1 middle
cerebral arteries are patent. No M2 proximal branch occlusion is
identified. Atherosclerotic irregularity of the M2 and more distal
middle cerebral artery vessels bilaterally. Most notably, there is a
moderate/severe stenosis within an inferior division right M2 MCA
vessel (series [HA], image 6). The anterior cerebral arteries are
patent. 1 mm inferiorly projecting vascular protrusion arising from
the paraclinoid left ICA, which may reflect an infundibulum or small
aneurysm (series [HA], image 16) (series [HA], image 195).

Posterior circulation:

The intracranial vertebral arteries are patent. The basilar artery
is patent. The posterior cerebral arteries are patent. Moderate
stenosis within the proximal P2 left PCA. Posterior communicating
arteries are hypoplastic or absent bilaterally.

Anatomic variants: As described.
IMPRESSION: MRI brain:

1. 7 mm acute/early subacute infarct within the medial right
midbrain and medial right thalamus.
2. Chronic small-vessel infarcts within the posterior limb of right
internal capsule and right thalamus.
3. Background cerebral white matter chronic small-vessel ischemic
changes, which are overall mild but advanced for age. Mild chronic
small-vessel ischemic changes also present within the pons.
4. Paranasal sinus disease, as described.
5. Bilateral mastoid effusions.

MRA head:

1. No intracranial large vessel occlusion identified.
2. Intracranial atherosclerotic disease, most notably as follows.
3. Moderate/severe stenosis within an inferior division proximal M2
right MCA vessel.
4. Moderate stenosis within the proximal P2 left PCA.
5. 1 mm inferiorly projecting vascular protrusion arising from the
paraclinoid left ICA, which may reflect an infundibulum or small
aneurysm.

## 2020-10-13 MED ORDER — LISINOPRIL-HYDROCHLOROTHIAZIDE 20-25 MG PO TABS: 1.0000 | ORAL_TABLET | Freq: Every day | ORAL | Status: DC

## 2020-10-13 MED ORDER — ACETAMINOPHEN 325 MG PO TABS: 650.0000 mg | ORAL_TABLET | ORAL | Status: DC | PRN

## 2020-10-13 MED ORDER — ASPIRIN EC 81 MG PO TBEC: 81.0000 mg | DELAYED_RELEASE_TABLET | Freq: Every day | ORAL | Status: DC

## 2020-10-13 MED ORDER — TETRACAINE HCL 0.5 % OP SOLN
2.0000 [drp] | Freq: Once | OPHTHALMIC | Status: DC
  Filled 2020-10-13: qty 4

## 2020-10-13 MED ORDER — SODIUM CHLORIDE 0.9 % IV SOLN: INTRAVENOUS | Status: DC

## 2020-10-13 MED ORDER — ASPIRIN 300 MG RE SUPP: 300.0000 mg | Freq: Every day | RECTAL | Status: DC

## 2020-10-13 MED ORDER — ATORVASTATIN CALCIUM 20 MG PO TABS
20.0000 mg | ORAL_TABLET | Freq: Every day | ORAL | Status: DC
  Administered 2020-10-13 – 2020-10-14 (×2): 20 mg via ORAL
  Filled 2020-10-13 (×2): qty 1

## 2020-10-13 MED ORDER — ASPIRIN 325 MG PO TABS
325.0000 mg | ORAL_TABLET | Freq: Every day | ORAL | Status: DC
  Administered 2020-10-13 – 2020-10-14 (×2): 325 mg via ORAL
  Filled 2020-10-13 (×2): qty 1

## 2020-10-13 MED ORDER — ACETAMINOPHEN 325 MG RE SUPP: 650.0000 mg | RECTAL | Status: DC | PRN

## 2020-10-13 MED ORDER — ENOXAPARIN SODIUM 40 MG/0.4ML IJ SOSY: 40.0000 mg | PREFILLED_SYRINGE | INTRAMUSCULAR | Status: DC

## 2020-10-13 MED ORDER — ACETAMINOPHEN 160 MG/5ML PO SOLN
650.0000 mg | ORAL | Status: DC | PRN
  Filled 2020-10-13: qty 20.3

## 2020-10-13 MED ORDER — FLUORESCEIN SODIUM 1 MG OP STRP
1.0000 | ORAL_STRIP | Freq: Once | OPHTHALMIC | Status: DC
  Filled 2020-10-13: qty 1

## 2020-10-13 MED ORDER — MOMETASONE FURO-FORMOTEROL FUM 100-5 MCG/ACT IN AERO
2.0000 | INHALATION_SPRAY | Freq: Two times a day (BID) | RESPIRATORY_TRACT | Status: DC
  Filled 2020-10-13: qty 8.8

## 2020-10-13 MED ORDER — NICOTINE 21 MG/24HR TD PT24
21.0000 mg | MEDICATED_PATCH | Freq: Every day | TRANSDERMAL | Status: DC
  Administered 2020-10-13 – 2020-10-14 (×2): 21 mg via TRANSDERMAL
  Filled 2020-10-13 (×2): qty 1

## 2020-10-13 MED ORDER — GADOBUTROL 1 MMOL/ML IV SOLN
7.5000 mL | Freq: Once | INTRAVENOUS | Status: AC | PRN
  Administered 2020-10-13: 7.5 mL via INTRAVENOUS
  Filled 2020-10-13: qty 7.5

## 2020-10-13 MED ORDER — SENNOSIDES-DOCUSATE SODIUM 8.6-50 MG PO TABS: 1.0000 | ORAL_TABLET | Freq: Every day | ORAL | Status: DC

## 2020-10-13 MED ORDER — HYDROCHLOROTHIAZIDE 25 MG PO TABS
25.0000 mg | ORAL_TABLET | Freq: Every day | ORAL | Status: DC
  Administered 2020-10-13 – 2020-10-14 (×2): 25 mg via ORAL
  Filled 2020-10-13 (×2): qty 1

## 2020-10-13 MED ORDER — STROKE: EARLY STAGES OF RECOVERY BOOK: Freq: Once | Status: DC

## 2020-10-13 MED ORDER — LISINOPRIL 10 MG PO TABS
20.0000 mg | ORAL_TABLET | Freq: Every day | ORAL | Status: DC
  Administered 2020-10-13 – 2020-10-14 (×2): 20 mg via ORAL
  Filled 2020-10-13 (×2): qty 2

## 2020-10-13 NOTE — ED Notes (Signed)
Patient returned from MRI.

## 2020-10-13 NOTE — H&P (Signed)
History and Physical    Henry Anderson MPN:[id number] DOB: 07-Jan-1966 DOA: 10/13/2020  PCP: [facility] (Confirm with patient/family/NH records and if not entered, this has to be entered at [facility] point of entry) Patient coming from: home  I have personally briefly reviewed patient's old medical records in [facility]  Chief Complaint: blurred vision, hypertension  HPI: Henry Anderson is a 55 y.o. male with medical history significant of DM, emphysema, HTN was seen 10/11/20 for HTN, blurred vision, HA. Eval was unremarkable with no focal neuro findings. CT head was negative. Patient was discharged home. He returns to [facility] for HA and persistent blurry vision. Denies focal weakness, although he has had a fall in the past week, paresthesia, diploplia, swallow difficulty.  ED Course: T 97.6  151/101  92  18. EDP exam notable for blurred vision, unreactive pupil, no focal weakness. Lab reveals glucose of 270, WBC 12.5 with nl diff. MRI brain w/ 24mm subacute infarct medial right mid-brain and thalamus, chornic small vessel infarcts right internal capusle and thalamus. 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 Dr. Wilford Corner for neurology consulted by EDP. [facility] called to admit patient for continue workup of CVA.  Review of Systems: As per HPI otherwise 10 point review of systems negative.    Past Medical History:  Diagnosis Date   Diabetes mellitus without complication (HCC)    Emphysema of lung (HCC)    Hypertension     Past Surgical History:  Procedure Laterality Date   FOOT SURGERY Right    KNEE SURGERY Right    SHOULDER SURGERY Left     Soc Hx - married 32 years. Has 1 son, 1 daughter, 1 grandson. Works as a Therapist, art. LIves with his wife. Had lost his job and insurance - unable to regularly afford his medications   reports that he has been smoking cigarettes. He has a 45.00 pack-year smoking history. He has quit using  smokeless tobacco.  His smokeless tobacco use included chew. He reports current alcohol use of about 1.0 - 2.0 standard drink per week. He reports that he does not use drugs.  No Known Allergies  Family History  Problem Relation Age of Onset   Diabetes Father    Diabetes Sister    Diabetes Sister     Prior to Admission medications   Medication Sig Start Date End Date Taking? Authorizing Provider  aspirin EC 81 MG tablet Take 81 mg by mouth daily.   Yes [provider]  fluticasone (FLONASE) 50 MCG/ACT nasal spray Place 2 sprays into both nostrils daily. 09/22/17  Yes Cook, Jayce G, DO  lisinopril-hydrochlorothiazide (ZESTORETIC) 20-25 MG tablet Take 1 tablet by mouth daily. 10/11/20  Yes Dionne Bucy, MD  metFORMIN (GLUCOPHAGE) 1000 MG tablet Take 1 tablet (1,000 mg total) by mouth 2 (two) times daily with a meal. 10/11/20  Yes Dionne Bucy, MD  atorvastatin (LIPITOR) 10 MG tablet Take 1 tablet by mouth daily. Patient not taking: Reported on 10/13/2020 02/12/19   [provider]    Physical Exam: Vitals:   10/13/20 1053 10/13/20 1304 10/13/20 1417 10/13/20 1543  BP:  132/77 (!) 142/85 (!) 151/101  Pulse:  100 97 92  Resp:  16 16 18   Temp:      TempSrc:      SpO2:  96% 96% 96%  Weight: 78.9 kg     Height: 5\' 9"  (1.753 m)  Vitals:   10/13/20 1053 10/13/20 1304 10/13/20 1417 10/13/20 1543  BP:  132/77 (!) 142/85 (!) 151/101  Pulse:  100 97 92  Resp:  16 16 18   Temp:      TempSrc:      SpO2:  96% 96% 96%  Weight: 78.9 kg     Height: 5\' 9"  (1.753 m)      General: WNWD man in no distress Eyes: Anisocoria right, lids and conjunctivae normal. Small growth on lateral aspect left upper eyelid ENMT: Mucous membranes are moist. Posterior pharynx clear of any exudate or lesions.Normal dentition.  Neck: normal, supple, no masses, no thyromegaly Respiratory: clear to auscultation bilaterally, no wheezing, no crackles. Normal respiratory effort. No  accessory muscle use.  Cardiovascular: Regular rate and rhythm, no murmurs / rubs / gallops. No extremity edema. 2+ pedal pulses. No carotid bruits.  Abdomen: no tenderness, no masses palpated. No hepatosplenomegaly. Bowel sounds positive.  Musculoskeletal: no clubbing / cyanosis. No joint deformity upper and lower extremities. Good ROM, no contractures. Normal muscle tone.  Skin: no rashes, lesions, ulcers. No induration Neurologic: CN 2-12: nl facial symmetry and movement; right pupil dilated and slow to react to light, impaired upward and lateral gaze right eye, no fasiculation tongue. MS- 5/5 throughout except for minimal weakness left leg. DTRs 2+ symmetrical. Sensation intact to light touch. Speech clear, cognition nl. Psychiatric: Normal judgment and insight. Alert and oriented x 3. Normal mood.     Labs on Admission: I have personally reviewed following labs and imaging studies  CBC: Recent Labs  Lab 10/11/20 1008 10/13/20 1300  WBC 9.6 12.5*  NEUTROABS  --  8.9*  HGB 16.1 17.1*  HCT 45.5 47.7  MCV 88.0 85.6  PLT 219 279   Basic Metabolic Panel: Recent Labs  Lab 10/11/20 1008 10/13/20 1300  NA 137 133*  K 4.0 3.9  CL 98 96*  CO2 25 28  GLUCOSE 343* 270*  BUN 11 11  CREATININE 0.77 0.74  CALCIUM 9.2 9.8   GFR: Estimated Creatinine Clearance: 106.8 mL/min (by C-G formula based on SCr of 0.74 mg/dL). Liver Function Tests: No results for input(s): AST, ALT, ALKPHOS, BILITOT, PROT, ALBUMIN in the last 168 hours. No results for input(s): LIPASE, AMYLASE in the last 168 hours. No results for input(s): AMMONIA in the last 168 hours. Coagulation Profile: No results for input(s): INR, PROTIME in the last 168 hours. Cardiac Enzymes: No results for input(s): CKTOTAL, CKMB, CKMBINDEX, TROPONINI in the last 168 hours. BNP (last 3 results) No results for input(s): PROBNP in the last 8760 hours. HbA1C: No results for input(s): HGBA1C in the last 72 hours. CBG: Recent Labs   Lab 10/11/20 1423  GLUCAP 162*   Lipid Profile: No results for input(s): CHOL, HDL, LDLCALC, TRIG, CHOLHDL, LDLDIRECT in the last 72 hours. Thyroid Function Tests: No results for input(s): TSH, T4TOTAL, FREET4, T3FREE, THYROIDAB in the last 72 hours. Anemia Panel: No results for input(s): VITAMINB12, FOLATE, FERRITIN, TIBC, IRON, RETICCTPCT in the last 72 hours. Urine analysis:    Component Value Date/Time   COLORURINE YELLOW (A) 10/13/2020 1555   APPEARANCEUR CLEAR (A) 10/13/2020 1555   APPEARANCEUR Clear 08/06/2012 1005   LABSPEC 1.030 10/13/2020 1555   LABSPEC 1.010 08/06/2012 1005   PHURINE 5.0 10/13/2020 1555   GLUCOSEU >=500 (A) 10/13/2020 1555   GLUCOSEU Negative 08/06/2012 1005   HGBUR NEGATIVE 10/13/2020 1555   BILIRUBINUR NEGATIVE 10/13/2020 1555   BILIRUBINUR Negative 08/06/2012 1005   KETONESUR NEGATIVE 10/13/2020  1555   PROTEINUR NEGATIVE 10/13/2020 1555   NITRITE NEGATIVE 10/13/2020 1555   LEUKOCYTESUR NEGATIVE 10/13/2020 1555   LEUKOCYTESUR Negative 08/06/2012 1005    Radiological Exams on Admission: MR ANGIO HEAD WO CONTRAST  Result Date: 10/13/2020 CLINICAL DATA:  Neuro deficit, acute, stroke suspected. Additional history provided by scanning technologist: Patient reports blurred vision and bilateral eye pain which began Sunday. EXAM: MRI HEAD WITHOUT AND WITH CONTRAST MRA HEAD WITHOUT CONTRAST TECHNIQUE: Multiplanar, multi-echo pulse sequences of the brain and surrounding structures were acquired without and with intravenous contrast. Angiographic images of the Circle of Willis were acquired using MRA technique without intravenous contrast. CONTRAST:  7.50mL GADAVIST GADOBUTROL 1 MMOL/ML IV SOLN COMPARISON:  Head CT 10/11/2020. FINDINGS: MRI HEAD FINDINGS Brain: Cerebral volume is normal for age. 7 mm acute/early subacute infarct within the medial right midbrain and medial right thalamus (series 9, image 22) (series 11, image 18). Chronic small-vessel infarcts  within the posterior limb of right internal capsule and right thalamus. Background multifocal T2 FLAIR hyperintense signal abnormality within the cerebral white matter, overall mild but advanced for age. The signal changes are nonspecific, but compatible chronic small vessel ischemic disease. Mild chronic small-vessel ischemic changes are also present within the pons. No evidence of an intracranial mass. No chronic intracranial blood products. No extra-axial fluid collection. No midline shift. No pathologic intracranial enhancement identified. Vascular: Maintained flow voids within the proximal large arterial vessels. Small right cerebellar developmental venous anomaly (incidental anatomic variant). Skull and upper cervical spine: No focal suspicious marrow lesion. Sinuses/Orbits: Visualized orbits show no acute finding. Fluid level and mild-to-moderate mucosal thickening within an asymmetrically diminutive left maxillary sinus. Trace mucosal thickening within the bilateral sphenoid sinuses. Mild mucosal thickening within the bilateral ethmoid air cells. Trace mucosal thickening within the right frontal sinus. Other: Bilateral mastoid effusions. MRA HEAD FINDINGS Anterior circulation: The intracranial internal carotid arteries are patent. The M1 middle cerebral arteries are patent. No M2 proximal branch occlusion is identified. Atherosclerotic irregularity of the M2 and more distal middle cerebral artery vessels bilaterally. Most notably, there is a moderate/severe stenosis within an inferior division right M2 MCA vessel (series 1033, image 6). The anterior cerebral arteries are patent. 1 mm inferiorly projecting vascular protrusion arising from the paraclinoid left ICA, which may reflect an infundibulum or small aneurysm (series 1033, image 16) (series 1050, image 195). Posterior circulation: The intracranial vertebral arteries are patent. The basilar artery is patent. The posterior cerebral arteries are patent.  Moderate stenosis within the proximal P2 left PCA. Posterior communicating arteries are hypoplastic or absent bilaterally. Anatomic variants: As described. IMPRESSION: MRI brain: 1. 7 mm acute/early subacute infarct within the medial right midbrain and medial right thalamus. 2. Chronic small-vessel infarcts within the posterior limb of right internal capsule and right thalamus. 3. Background cerebral white matter chronic small-vessel ischemic changes, which are overall mild but advanced for age. Mild chronic small-vessel ischemic changes also present within the pons. 4. Paranasal sinus disease, as described. 5. Bilateral mastoid effusions. MRA head: 1. No intracranial large vessel occlusion identified. 2. Intracranial atherosclerotic disease, most notably as follows. 3. Moderate/severe stenosis within an inferior division proximal M2 right MCA vessel. 4. Moderate stenosis within the proximal P2 left PCA. 5. 1 mm inferiorly projecting vascular protrusion arising from the paraclinoid left ICA, which may reflect an infundibulum or small aneurysm. Electronically Signed   By: Jackey Loge D.O.   On: 10/13/2020 15:30   MR Brain W and Wo Contrast  Result Date:  10/13/2020 CLINICAL DATA:  Neuro deficit, acute, stroke suspected. Additional history provided by scanning technologist: Patient reports blurred vision and bilateral eye pain which began Sunday. EXAM: MRI HEAD WITHOUT AND WITH CONTRAST MRA HEAD WITHOUT CONTRAST TECHNIQUE: Multiplanar, multi-echo pulse sequences of the brain and surrounding structures were acquired without and with intravenous contrast. Angiographic images of the Circle of Willis were acquired using MRA technique without intravenous contrast. CONTRAST:  7.39mL GADAVIST GADOBUTROL 1 MMOL/ML IV SOLN COMPARISON:  Head CT 10/11/2020. FINDINGS: MRI HEAD FINDINGS Brain: Cerebral volume is normal for age. 7 mm acute/early subacute infarct within the medial right midbrain and medial right thalamus (series  9, image 22) (series 11, image 18). Chronic small-vessel infarcts within the posterior limb of right internal capsule and right thalamus. Background multifocal T2 FLAIR hyperintense signal abnormality within the cerebral white matter, overall mild but advanced for age. The signal changes are nonspecific, but compatible chronic small vessel ischemic disease. Mild chronic small-vessel ischemic changes are also present within the pons. No evidence of an intracranial mass. No chronic intracranial blood products. No extra-axial fluid collection. No midline shift. No pathologic intracranial enhancement identified. Vascular: Maintained flow voids within the proximal large arterial vessels. Small right cerebellar developmental venous anomaly (incidental anatomic variant). Skull and upper cervical spine: No focal suspicious marrow lesion. Sinuses/Orbits: Visualized orbits show no acute finding. Fluid level and mild-to-moderate mucosal thickening within an asymmetrically diminutive left maxillary sinus. Trace mucosal thickening within the bilateral sphenoid sinuses. Mild mucosal thickening within the bilateral ethmoid air cells. Trace mucosal thickening within the right frontal sinus. Other: Bilateral mastoid effusions. MRA HEAD FINDINGS Anterior circulation: The intracranial internal carotid arteries are patent. The M1 middle cerebral arteries are patent. No M2 proximal branch occlusion is identified. Atherosclerotic irregularity of the M2 and more distal middle cerebral artery vessels bilaterally. Most notably, there is a moderate/severe stenosis within an inferior division right M2 MCA vessel (series 1033, image 6). The anterior cerebral arteries are patent. 1 mm inferiorly projecting vascular protrusion arising from the paraclinoid left ICA, which may reflect an infundibulum or small aneurysm (series 1033, image 16) (series 1050, image 195). Posterior circulation: The intracranial vertebral arteries are patent. The  basilar artery is patent. The posterior cerebral arteries are patent. Moderate stenosis within the proximal P2 left PCA. Posterior communicating arteries are hypoplastic or absent bilaterally. Anatomic variants: As described. IMPRESSION: MRI brain: 1. 7 mm acute/early subacute infarct within the medial right midbrain and medial right thalamus. 2. Chronic small-vessel infarcts within the posterior limb of right internal capsule and right thalamus. 3. Background cerebral white matter chronic small-vessel ischemic changes, which are overall mild but advanced for age. Mild chronic small-vessel ischemic changes also present within the pons. 4. Paranasal sinus disease, as described. 5. Bilateral mastoid effusions. MRA head: 1. No intracranial large vessel occlusion identified. 2. Intracranial atherosclerotic disease, most notably as follows. 3. Moderate/severe stenosis within an inferior division proximal M2 right MCA vessel. 4. Moderate stenosis within the proximal P2 left PCA. 5. 1 mm inferiorly projecting vascular protrusion arising from the paraclinoid left ICA, which may reflect an infundibulum or small aneurysm. Electronically Signed   By: Jackey Loge D.O.   On: 10/13/2020 15:30    EKG: Independently reviewed. 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 Was on ASA 81 mg daily. Was not getting medications regularly 2/2 financial barriers.  PLan Inpatient tele admit  Carotid dopplers  2D echo  Risk factor mediation  Increase ASA to 325  AM lab: A1C, lipid panel  Increase Lipitor to 20 mg daily  Smoking cessation  PT/OT evaluation  Neurology to follow  2. DM - last A1C 08/21/20 9.1% Was on  metformin PLan Hold metformin for now during acute illness  SS  3. HTN - BP elevated.  PLan Continue home medication: ACE-I/Hct  Permissive hypertension  4. Emphysema - no respiratory distress. Patient had been prescribe Advair in the past but was intolerant 2/2 cough. Currently in no respiratory distress Plan Dulera 2 puffs bid  Smoking cessation  5. Tobacco abuse - discussed cessation strategies at length. He understands the importance of cessation Plan Nicotine patch 24 mg 16 hrs/day  6. Barriers to care - patient w/o insurance making care difficult to access and making medications prohibitively expensive Plan  TOC consult re: medication assistance.  DVT prophylaxis: Lovenox  Code Status: full code  Family Communication: wife present during interview and exam. They both understand Dx and TX plan  Disposition Plan: home when medically stable  Consults called: neurology - Dr. Wilford Corner Admission status: inpatient - tele    Illene Regulus MD [facility] Pager [phone]  If 7PM-7AM, please contact night-coverage [email or web address] Password [email or web address]  10/13/2020, 5:26 PM

## 2020-10-13 NOTE — ED Notes (Signed)
Patient transported to MRI 

## 2020-10-13 NOTE — ED Triage Notes (Signed)
Pt here with blurred vision and bilateral eye pain that started Sunday. Pt states that he was here recently to have his bp and hyperglycemia treated. Pt in no distress in triage.

## 2020-10-13 NOTE — Plan of Care (Signed)
Called by EDP regarding patient with concern for stroke.  Recommended MRI brain and MRA head given nonreactive pupil.  MRI brain with a small stroke in the upper brainstem.  Given history of diabetes and hypertension, likely small vessel etiology.  Recommend admission to the hospitalist for stroke work-up to include: Telemetry Frequent neurochecks Carotid Dopplers A1c Lipid panel 2D echo PT OT Speech therapy  Full formal neurology consultation in the morning.  Plan discussed with Dr. Roxan Hockey  Will follow  -- Milon Dikes, MD Neurologist [facility] Pager: [phone]

## 2020-10-13 NOTE — ED Provider Notes (Signed)
[facility] Emergency Department Provider Note    Event Date/Time   First MD Initiated Contact with Patient 10/13/20 1239     (approximate)  I have reviewed the triage vital signs and the nursing notes.   HISTORY  Chief Complaint Blurred Vision and Eye Pain    HPI Henry Anderson is a 55 y.o. male presents to the ER for evaluation headache as well as blurry vision as well as feeling like he is falling to the left side.  States he was seen for high blood pressure high blood sugar and headache over the weekend and work-up was reassuring but started having worsening symptoms over the past few days and came back today for further evaluation.  Past Medical History:  Diagnosis Date   Diabetes mellitus without complication (HCC)    Emphysema of lung (HCC)    Hypertension    Family History  Problem Relation Age of Onset   Diabetes Father    Diabetes Sister    Diabetes Sister    Past Surgical History:  Procedure Laterality Date   FOOT SURGERY Right    KNEE SURGERY Right    SHOULDER SURGERY Left    Patient Active Problem List   Diagnosis Date Noted   DM2 (diabetes mellitus, type 2) (HCC) 10/13/2020   HTN (hypertension) 10/13/2020   Emphysema of lung (HCC) 10/13/2020   CVA (cerebral vascular accident) (HCC) 10/13/2020   CVA (cerebrovascular accident) (HCC) 10/13/2020       Prior to Admission medications   Medication Sig Start Date End Date Taking? Authorizing Provider  aspirin EC 81 MG tablet Take 81 mg by mouth daily.   Yes [provider]  fluticasone (FLONASE) 50 MCG/ACT nasal spray Place 2 sprays into both nostrils daily. 09/22/17  Yes Cook, Jayce G, DO  lisinopril-hydrochlorothiazide (ZESTORETIC) 20-25 MG tablet Take 1 tablet by mouth daily. 10/11/20  Yes Dionne Bucy, MD  metFORMIN (GLUCOPHAGE) 1000 MG tablet Take 1 tablet (1,000 mg total) by mouth 2 (two) times daily with a meal. 10/11/20  Yes Dionne Bucy, MD   atorvastatin (LIPITOR) 10 MG tablet Take 1 tablet by mouth daily. Patient not taking: Reported on 10/13/2020 02/12/19   [provider]    Allergies Patient has no known allergies.    Social History Social History   Tobacco Use   Smoking status: Every Day    Packs/day: 1.50    Years: 30.00    Pack years: 45.00    Types: Cigarettes   Smokeless tobacco: Former    Types: Associate Professor Use: Never used  Substance Use Topics   Alcohol use: Yes    Alcohol/week: 1.0 - 2.0 standard drink    Types: 1 - 2 Cans of beer per week    Comment: occasionally   Drug use: No    Review of Systems Patient denies headaches, rhinorrhea, blurry vision, numbness, shortness of breath, chest pain, edema, cough, abdominal pain, nausea, vomiting, diarrhea, dysuria, fevers, rashes or hallucinations unless otherwise stated above in HPI. ____________________________________________   PHYSICAL EXAM:  VITAL SIGNS: Vitals:   10/13/20 1417 10/13/20 1543  BP: (!) 142/85 (!) 151/101  Pulse: 97 92  Resp: 16 18  Temp:    SpO2: 96% 96%    Constitutional: Alert and oriented.  Eyes: Conjunctivae are normal.   Vertical gaze palsy with inducible downward nystagmus,  pupils dilated 77mm, reactive Head: Atraumatic. Nose: No congestion/rhinnorhea. Mouth/Throat: Mucous membranes are moist.   Neck: No  stridor. Painless ROM.  Cardiovascular: Normal rate, regular rhythm. Grossly normal heart sounds.  Good peripheral circulation. Respiratory: Normal respiratory effort.  No retractions. Lungs CTAB. Gastrointestinal: Soft and nontender. No distention. No abdominal bruits. No CVA tenderness. Genitourinary:  Musculoskeletal: No lower extremity tenderness nor edema.  No joint effusions. Neurologic:  Normal speech and language. No gross focal neurologic deficits are appreciated. No facial droop Skin:  Skin is warm, dry and intact. No rash noted. Psychiatric: Mood and affect are normal. Speech and  behavior are normal.  ____________________________________________   LABS (all labs ordered are listed, but only abnormal results are displayed)  Results for orders placed or performed during the hospital encounter of 10/13/20 (from the past 24 hour(s))  CBC with Differential     Status: Abnormal   Collection Time: 10/13/20  1:00 PM  Result Value Ref Range   WBC 12.5 (H) 4.0 - 10.5 K/uL   RBC 5.57 4.22 - 5.81 MIL/uL   Hemoglobin 17.1 (H) 13.0 - 17.0 g/dL   HCT 78.2 42.3 - 53.6 %   MCV 85.6 80.0 - 100.0 fL   MCH 30.7 26.0 - 34.0 pg   MCHC 35.8 30.0 - 36.0 g/dL   RDW 14.4 31.5 - 40.0 %   Platelets 279 150 - 400 K/uL   nRBC 0.0 0.0 - 0.2 %   Neutrophils Relative % 70 %   Neutro Abs 8.9 (H) 1.7 - 7.7 K/uL   Lymphocytes Relative 21 %   Lymphs Abs 2.6 0.7 - 4.0 K/uL   Monocytes Relative 6 %   Monocytes Absolute 0.7 0.1 - 1.0 K/uL   Eosinophils Relative 1 %   Eosinophils Absolute 0.2 0.0 - 0.5 K/uL   Basophils Relative 1 %   Basophils Absolute 0.1 0.0 - 0.1 K/uL   Immature Granulocytes 1 %   Abs Immature Granulocytes 0.07 0.00 - 0.07 K/uL  Basic metabolic panel     Status: Abnormal   Collection Time: 10/13/20  1:00 PM  Result Value Ref Range   Sodium 133 (L) 135 - 145 mmol/L   Potassium 3.9 3.5 - 5.1 mmol/L   Chloride 96 (L) 98 - 111 mmol/L   CO2 28 22 - 32 mmol/L   Glucose, Bld 270 (H) 70 - 99 mg/dL   BUN 11 6 - 20 mg/dL   Creatinine, Ser 8.67 0.61 - 1.24 mg/dL   Calcium 9.8 8.9 - 61.9 mg/dL   GFR, Estimated >50 >93 mL/min   Anion gap 9 5 - 15  Resp Panel by RT-PCR (Flu A&B, Covid) Nasopharyngeal Swab     Status: None   Collection Time: 10/13/20  2:20 PM   Specimen: Nasopharyngeal Swab; Nasopharyngeal(NP) swabs in vial transport medium  Result Value Ref Range   SARS Coronavirus 2 by RT PCR NEGATIVE NEGATIVE   Influenza A by PCR NEGATIVE NEGATIVE   Influenza B by PCR NEGATIVE NEGATIVE  Urine Drug Screen, Qualitative ([facility] only)     Status: None   Collection Time:  10/13/20  3:55 PM  Result Value Ref Range   Tricyclic, Ur Screen NONE DETECTED NONE DETECTED   Amphetamines, Ur Screen NONE DETECTED NONE DETECTED   MDMA (Ecstasy)Ur Screen NONE DETECTED NONE DETECTED   Cocaine Metabolite,Ur [place] NONE DETECTED NONE DETECTED   Opiate, Ur Screen NONE DETECTED NONE DETECTED   Phencyclidine (PCP) Ur S NONE DETECTED NONE DETECTED   Cannabinoid 50 Ng, Ur [place] NONE DETECTED NONE DETECTED   Barbiturates, Ur Screen NONE DETECTED NONE DETECTED   Benzodiazepine,  Ur Scrn NONE DETECTED NONE DETECTED   Methadone Scn, Ur NONE DETECTED NONE DETECTED  Urinalysis, Complete w Microscopic     Status: Abnormal   Collection Time: 10/13/20  3:55 PM  Result Value Ref Range   Color, Urine YELLOW (A) YELLOW   APPearance CLEAR (A) CLEAR   Specific Gravity, Urine 1.030 1.005 - 1.030   pH 5.0 5.0 - 8.0   Glucose, UA >=500 (A) NEGATIVE mg/dL   Hgb urine dipstick NEGATIVE NEGATIVE   Bilirubin Urine NEGATIVE NEGATIVE   Ketones, ur NEGATIVE NEGATIVE mg/dL   Protein, ur NEGATIVE NEGATIVE mg/dL   Nitrite NEGATIVE NEGATIVE   Leukocytes,Ua NEGATIVE NEGATIVE   WBC, UA 0-5 0 - 5 WBC/hpf   Bacteria, UA NONE SEEN NONE SEEN   Squamous Epithelial / LPF 0-5 0 - 5   ____________________________________________  EKG My review and personal interpretation at Time: 14:22   Indication: weakness  Rate: 100  Rhythm: sinus Axis: normal Other: normal intervals, no stemi ____________________________________________  RADIOLOGY  I personally reviewed all radiographic images ordered to evaluate for the above acute complaints and reviewed radiology reports and findings.  These findings were personally discussed with the patient.  Please see medical record for radiology report.  ____________________________________________   PROCEDURES  Procedure(s) performed:  Procedures    Critical Care performed: no ____________________________________________   INITIAL IMPRESSION / ASSESSMENT AND PLAN /  ED COURSE  Pertinent labs & imaging results that were available during my care of the patient were reviewed by me and considered in my medical decision making (see chart for details).   DDX: cva, tia, hypoglycemia, dehydration, electrolyte abnormality, dissection, sepsis   Henry Anderson is a 55 y.o. who presents to the ED with presentation as described above.  Presentation concerning for possible CVA and above differential.  CT imaging on the weekend was normal.  Do not feel that repeat CT head clinically indicated as were outside of the window for tPA will order MRI will discuss with neurology.  Clinical Course as of 10/13/20 1707  Tue Oct 13, 2020  1319 Patient's ocular pressure is 14 on the right 16 on the left. [PR]  1406 Case discussed with Dr. Wilford Corner of neurology.  Has recommended MRI brain with and without and MRA. [PR]  1514 Patient does have stroke on MRI.  Will discuss with hospitalist for admission. [PR]    Clinical Course User Index [PR] Willy Eddy, MD    The patient was evaluated in Emergency Department today for the symptoms described in the history of present illness. He/she was evaluated in the context of the global COVID-19 pandemic, which necessitated consideration that the patient might be at risk for infection with the SARS-CoV-2 virus that causes COVID-19. Institutional protocols and algorithms that pertain to the evaluation of patients at risk for COVID-19 are in a state of rapid change based on information released by regulatory bodies including the CDC and federal and state organizations. These policies and algorithms were followed during the patient's care in the ED.  As part of my medical decision making, I reviewed the following data within the electronic MEDICAL RECORD NUMBER Nursing notes reviewed and incorporated, Labs reviewed, notes from prior ED visits and [place] Controlled Substance Database   ____________________________________________   FINAL CLINICAL  IMPRESSION(S) / ED DIAGNOSES  Final diagnoses:  Blurred vision, bilateral  Cerebrovascular accident (CVA), unspecified mechanism (HCC)      NEW MEDICATIONS STARTED DURING THIS VISIT:  New Prescriptions   No medications on file  Note:  This document was prepared using Dragon voice recognition software and may include unintentional dictation errors.    Willy Eddy, MD 10/13/20 [phone]

## 2020-10-14 ENCOUNTER — Other Ambulatory Visit: Payer: Self-pay

## 2020-10-14 ENCOUNTER — Inpatient Hospital Stay: Payer: Medicaid Other

## 2020-10-14 DIAGNOSIS — I639 Cerebral infarction, unspecified: Principal | ICD-10-CM

## 2020-10-14 LAB — ECHOCARDIOGRAM COMPLETE
Area-P 1/2: 3.48 cm2
Height: 69 in
S' Lateral: 2.7 cm
Weight: 2784 oz

## 2020-10-14 LAB — LDL CHOLESTEROL, DIRECT: Direct LDL: 109.2 mg/dL — ABNORMAL HIGH (ref 0–99)

## 2020-10-14 LAB — LIPID PANEL
Cholesterol: 198 mg/dL (ref 0–200)
HDL: 26 mg/dL — ABNORMAL LOW (ref >40)
LDL Cholesterol: UNDETERMINED mg/dL (ref 0–99)
Total CHOL/HDL Ratio: 7.6 RATIO
Triglycerides: 446 mg/dL — ABNORMAL HIGH (ref <150)
VLDL: UNDETERMINED mg/dL (ref 0–40)

## 2020-10-14 LAB — HEMOGLOBIN A1C
Hgb A1c MFr Bld: 9.7 % — ABNORMAL HIGH (ref 4.8–5.6)
Mean Plasma Glucose: 232 mg/dL

## 2020-10-14 LAB — HIV ANTIBODY (ROUTINE TESTING W REFLEX): HIV Screen 4th Generation wRfx: NONREACTIVE

## 2020-10-14 IMAGING — US US CAROTID DUPLEX BILAT
1 series · 13 of 24 positions shown · non-contrast
Comparison: None.

CLINICAL DATA: Recent stroke

EXAM:
BILATERAL CAROTID DUPLEX ULTRASOUND
TECHNIQUE: Gray scale imaging, color Doppler and duplex ultrasound were
performed of bilateral carotid and vertebral arteries in the neck.

[Series 1: us carotid bilateral · 13 of 65 slices shown]
[im 1/65]
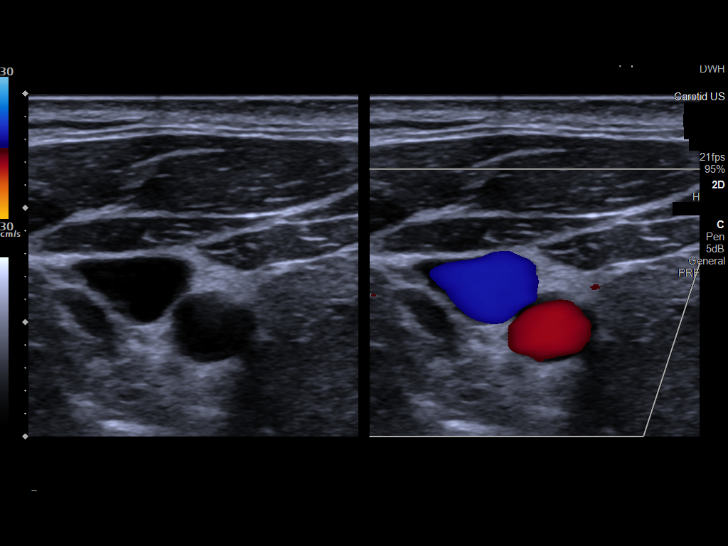
[im 6/65]
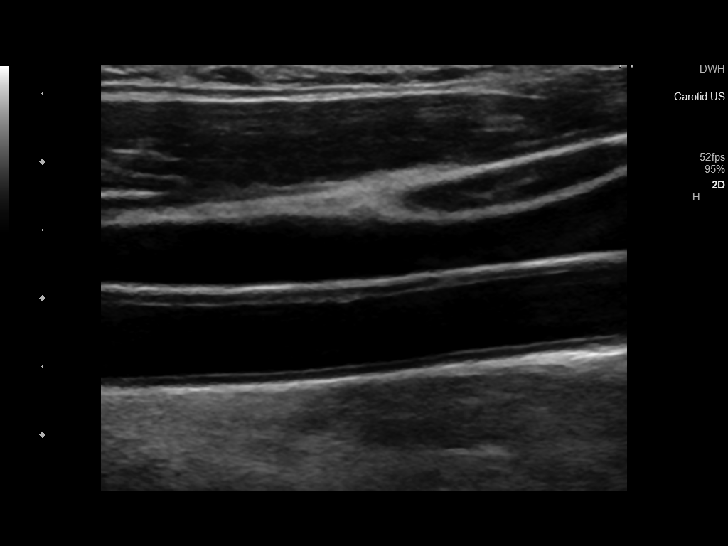
[im 12/65]
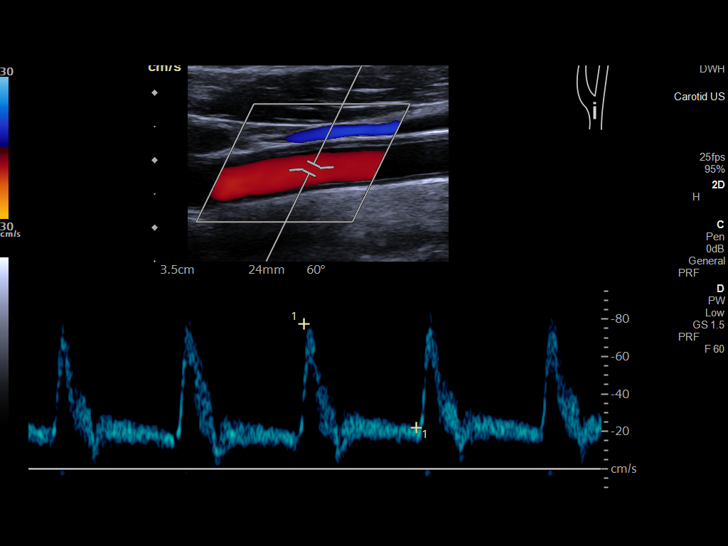
[im 17/65]
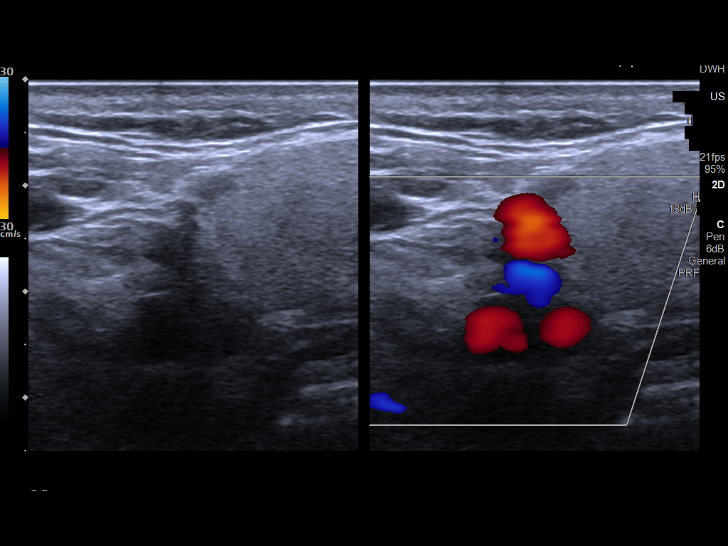
[im 23/65]
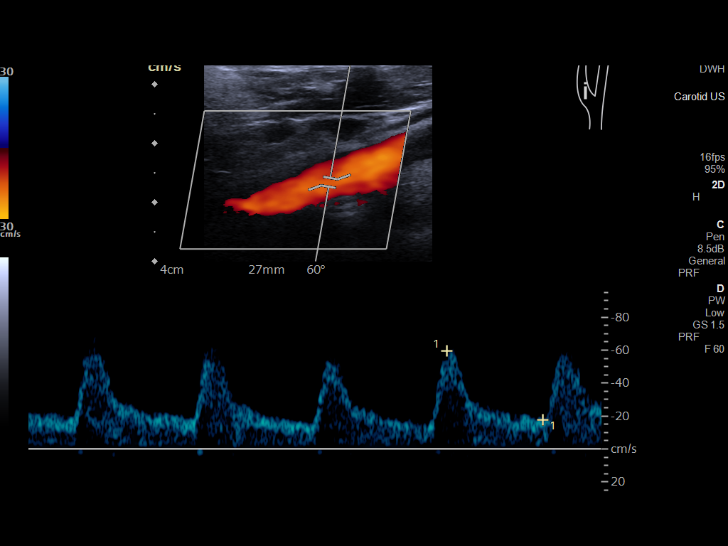
[im 28/65]
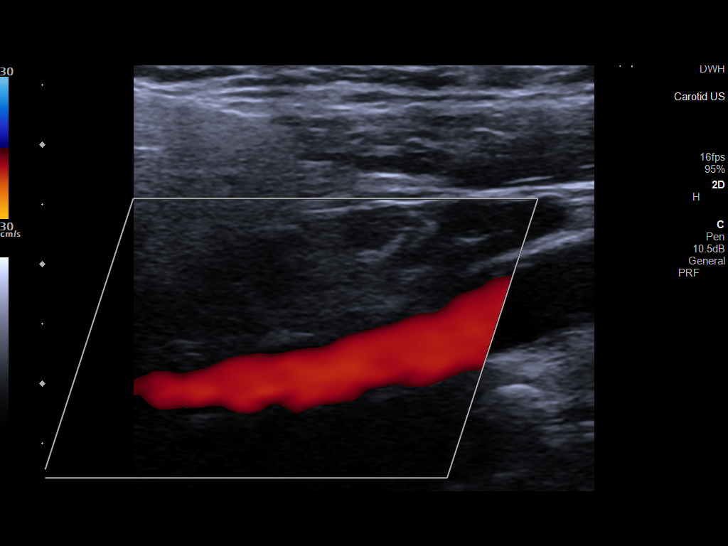
[im 34/65]
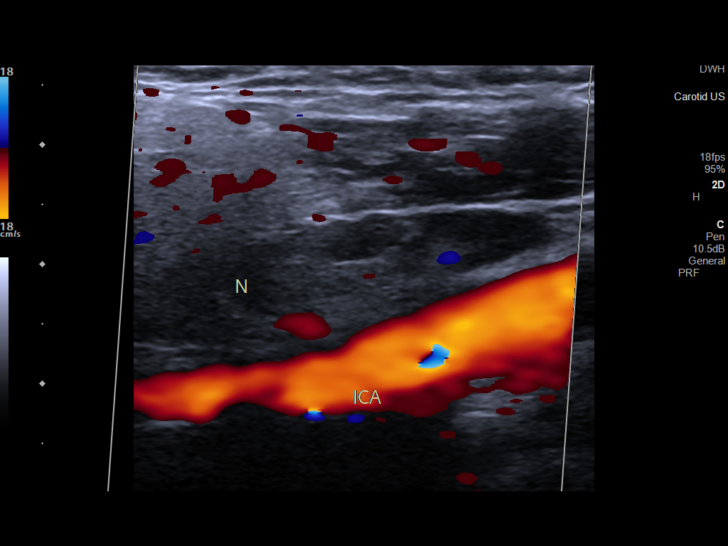
[im 37/65]
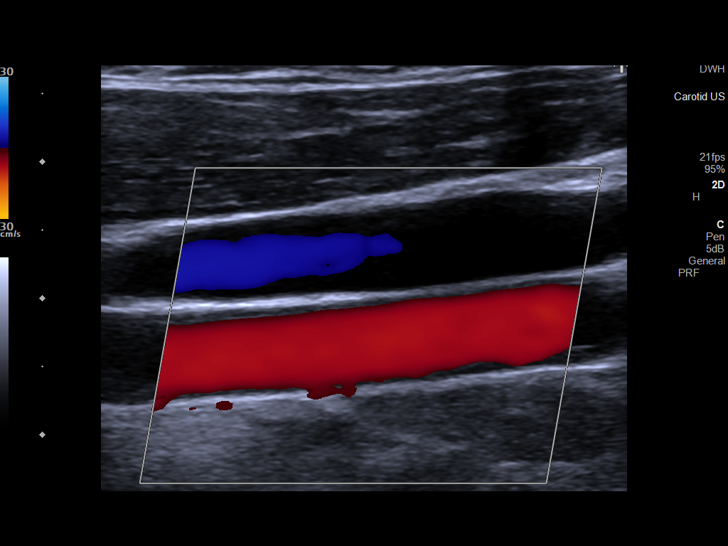
[im 42/65]
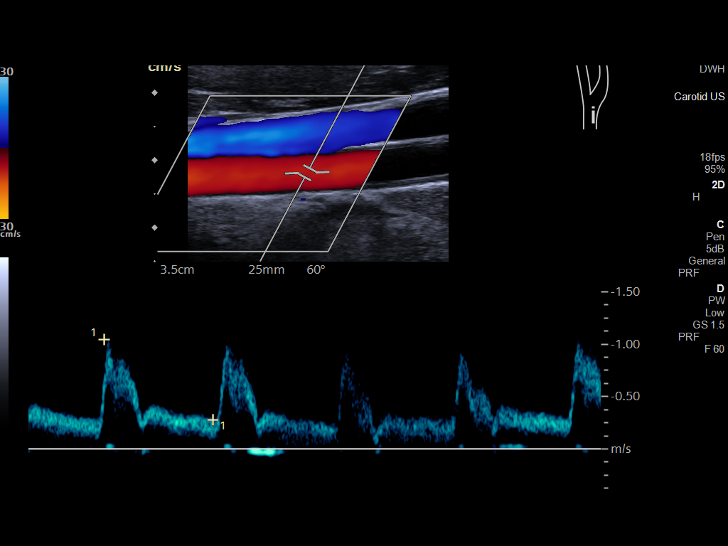
[im 48/65]
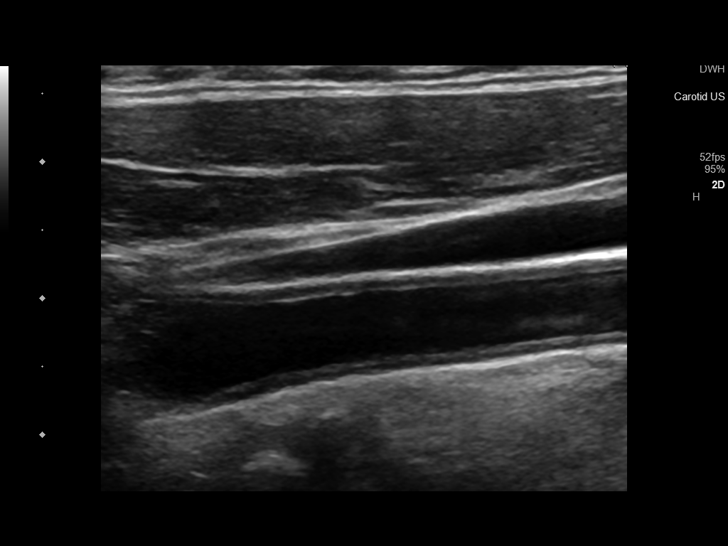
[im 53/65]
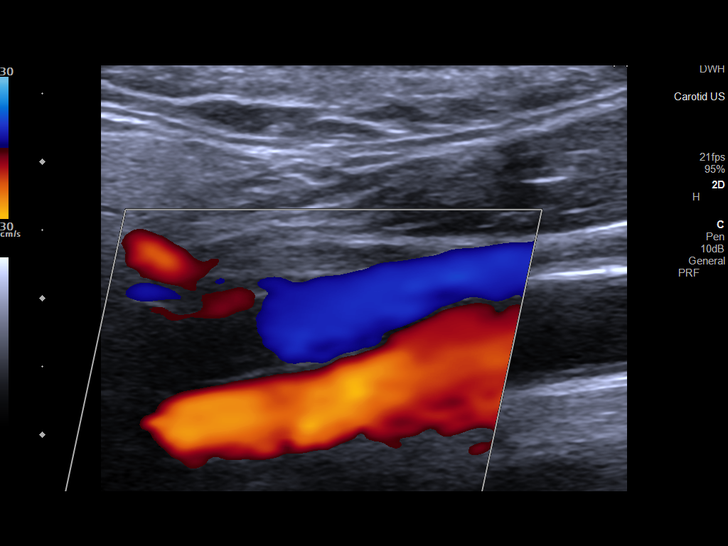
[im 59/65]
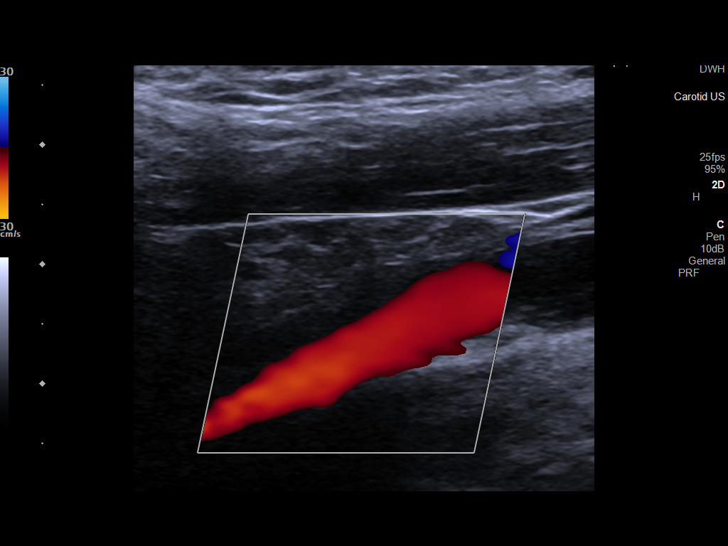
[im 65/65]
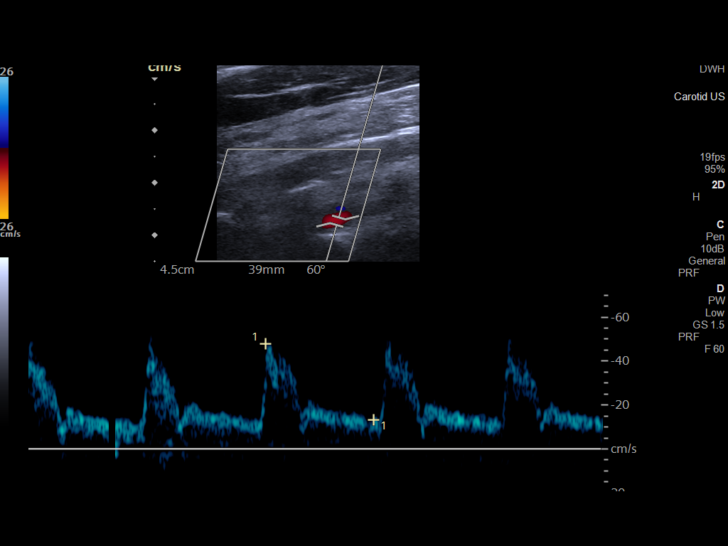

[13 of 24 positions shown; findings below may reference images not displayed]

FINDINGS: Criteria: Quantification of carotid stenosis is based on velocity
parameters that correlate the residual internal carotid diameter
with NASCET-based stenosis levels, using the diameter of the distal
internal carotid lumen as the denominator for stenosis measurement.

The following velocity measurements were obtained:

RIGHT

ICA: 63/21 cm/sec

CCA: 108/24 cm/sec

SYSTOLIC ICA/CCA RATIO:

ECA: 100 cm/sec

LEFT

ICA: 67/16 cm/sec

CCA: No 4/28 cm/sec

SYSTOLIC ICA/CCA RATIO:

ECA: No 5 cm/sec

RIGHT CAROTID ARTERY: Examination of preliminary grayscale images
show minimal atherosclerotic plaque in the region of the carotid
bulb. Waveforms, velocities and flow velocity ratios show no
evidence of focal hemodynamically significant stenosis.

RIGHT VERTEBRAL ARTERY:  Antegrade in nature.

LEFT CAROTID ARTERY: Examination of the grayscale images demonstrate
minimal atherosclerotic plaque in the region of the carotid bulb.
The waveforms, velocities and flow velocity ratios however
demonstrate no evidence of focal hemodynamically significant
stenosis.

LEFT VERTEBRAL ARTERY:  Antegrade in nature.

Incidental note is made of a small lymph node in the right neck with
normal fatty hilus.
IMPRESSION: Minimal atherosclerotic plaque bilaterally. No focal hemodynamically
significant stenosis is noted.

## 2020-10-14 MED ORDER — ACETAMINOPHEN 325 MG PO TABS: 650.0000 mg | ORAL_TABLET | ORAL | Status: AC | PRN

## 2020-10-14 MED ORDER — NICOTINE 21 MG/24HR TD PT24: 21.0000 mg | MEDICATED_PATCH | Freq: Every day | TRANSDERMAL | 0 refills | Status: DC

## 2020-10-14 MED ORDER — CLOPIDOGREL BISULFATE 75 MG PO TABS: 75.0000 mg | ORAL_TABLET | Freq: Every day | ORAL | 0 refills | Status: DC

## 2020-10-14 MED ORDER — SENNOSIDES-DOCUSATE SODIUM 8.6-50 MG PO TABS: 1.0000 | ORAL_TABLET | Freq: Every day | ORAL | 0 refills | Status: DC

## 2020-10-14 MED ORDER — ASPIRIN 325 MG PO TABS: 325.0000 mg | ORAL_TABLET | Freq: Every day | ORAL | 0 refills | Status: DC

## 2020-10-14 MED ORDER — CLOPIDOGREL BISULFATE 75 MG PO TABS
75.0000 mg | ORAL_TABLET | Freq: Every day | ORAL | 0 refills | Status: DC
  Filled 2020-10-14: qty 30, 30d supply, fill #0

## 2020-10-14 MED ORDER — FLUTICASONE PROPIONATE 50 MCG/ACT NA SUSP
2.0000 | Freq: Every day | NASAL | 0 refills | Status: DC
  Filled 2020-10-14: qty 16, 60d supply, fill #0

## 2020-10-14 MED ORDER — ACETAMINOPHEN 325 MG PO TABS: 650.0000 mg | ORAL_TABLET | ORAL | Status: DC | PRN

## 2020-10-14 MED ORDER — ATORVASTATIN CALCIUM 20 MG PO TABS: 20.0000 mg | ORAL_TABLET | Freq: Every day | ORAL | 0 refills | Status: DC

## 2020-10-14 MED ORDER — CLOPIDOGREL BISULFATE 75 MG PO TABS
75.0000 mg | ORAL_TABLET | Freq: Every day | ORAL | Status: DC
  Administered 2020-10-14: 75 mg via ORAL
  Filled 2020-10-14: qty 1

## 2020-10-14 MED ORDER — MOMETASONE FURO-FORMOTEROL FUM 100-5 MCG/ACT IN AERO
2.0000 | INHALATION_SPRAY | Freq: Two times a day (BID) | RESPIRATORY_TRACT | 0 refills | Status: DC
  Filled 2020-10-14: qty 1, fill #0

## 2020-10-14 MED ORDER — NICOTINE 21 MG/24HR TD PT24
21.0000 mg | MEDICATED_PATCH | Freq: Every day | TRANSDERMAL | 0 refills | Status: DC
Start: 2020-10-15 — End: 2021-04-20
  Filled 2020-10-14: qty 28, 28d supply, fill #0

## 2020-10-14 MED ORDER — MOMETASONE FURO-FORMOTEROL FUM 100-5 MCG/ACT IN AERO: 2.0000 | INHALATION_SPRAY | Freq: Two times a day (BID) | RESPIRATORY_TRACT | 0 refills | Status: DC

## 2020-10-14 MED ORDER — ASPIRIN 325 MG PO TABS
325.0000 mg | ORAL_TABLET | Freq: Every day | ORAL | 0 refills | Status: DC
  Filled 2020-10-14: qty 21, 21d supply, fill #0

## 2020-10-14 MED ORDER — ATORVASTATIN CALCIUM 20 MG PO TABS
20.0000 mg | ORAL_TABLET | Freq: Every day | ORAL | 0 refills | Status: DC
  Filled 2020-10-14: qty 30, 30d supply, fill #0

## 2020-10-14 MED ORDER — SENNOSIDES-DOCUSATE SODIUM 8.6-50 MG PO TABS
1.0000 | ORAL_TABLET | Freq: Every day | ORAL | 0 refills | Status: DC
  Filled 2020-10-14: qty 30, 30d supply, fill #0

## 2020-10-14 NOTE — Progress Notes (Signed)
Discharge instructions given to wife and patient along with Stroke Booklet. Educated on importance of medication regimen and [facility]. Verbalized understanding. Walker delivered to room. Medications will be picked up at med management clinic. No acute distress at this time. Wife to transport patient home.

## 2020-10-14 NOTE — Evaluation (Signed)
Occupational Therapy Evaluation Patient Details Name: Henry Anderson MRN: [id number] DOB: [birth year] Today's Date: 10/14/2020   History of Present Illness 55 y.o. male with medical history significant of DM, emphysema, HTN was seen 10/11/20 for HTN, blurred vision, HA. CT head was negative. Patient was discharged home. He returned to [facility]ED 10/13/20 for HA and persistent blurry vision, paresthesia, diploplia, swallow difficulty. Pt had fall this week. MRI brain revealed acute/early subacute infarct within the medial right midbrain and medial right thalamus.   Clinical Impression   Pt seen for OT evaluation this date. Prior to admission, pt was independent in all ADLs, IADLs, and functional mobility, living in a 1-story home with spouse, son, daughter-in-law, and grandson. Pt enjoys cooking and fishing and is looking forward to continuing these activities when safe to do so. Pt currently presents with diplopia, impaired ocular ROM (difficulty looking up/down), L-side inattention, and decreased balance. Due to current functional impairments, pt requires SUPERVISION for sit<>stand transfers, SUPERVISION for standing grooming tasks, and MIN GUARD for functional mobility of household distances (~115ft) without AD. Pt would benefit from additional skilled OT services to maximize return to PLOF and minimize risk of future falls, injury, caregiver burden, and readmission. Upon discharge, recommend HHOT services to address context specific ADLs/IADLs.     Recommendations for follow up therapy are one component of a multi-disciplinary discharge planning process, led by the attending physician.  Recommendations may be updated based on patient status, additional functional criteria and insurance authorization.   Follow Up Recommendations  Home health OT;Supervision - Intermittent    Equipment Recommendations  Tub/shower bench       Precautions / Restrictions Precautions Precautions:  Fall Restrictions Weight Bearing Restrictions: No      Mobility Bed Mobility Overal bed mobility: Modified Independent                  Transfers Overall transfer level: Needs assistance   Transfers: Sit to/from Stand Sit to Stand: Supervision         General transfer comment: Able to perform with increased time. SUPERVISION d/t mild dizziness with change in position. No LOB observed    Balance Overall balance assessment: Needs assistance Sitting-balance support: No upper extremity supported;Feet unsupported Sitting balance-Leahy Scale: Normal Sitting balance - Comments: Normal sitting balance at EOB donning/doffing socks   Standing balance support: No upper extremity supported;During functional activity Standing balance-Leahy Scale: Fair Standing balance comment: MIN GUARD to walk ~110ft, presenting with intermittent L lateral lean and L-inattention without AD. Able to re-adjust to midline independently                           ADL either performed or assessed with clinical judgement   ADL Overall ADL's : Needs assistance/impaired     Grooming: Wash/dry hands;Supervision/safety;Standing               Lower Body Dressing: Modified independent;Sitting/lateral leans Lower Body Dressing Details (indicate cue type and reason): to don/doff socks             Functional mobility during ADLs: Min guard (MIN GUARD in setting of intermittent L lateral lean and L-inattention)       Vision Baseline Vision/History: 1 Wears glasses Patient Visual Report: Diplopia Vision Assessment?: Yes Ocular Range of Motion: Restricted looking up;Restricted looking down (unable to track objects vertically) Diplopia Assessment: Objects split side to side            Pertinent Vitals/Pain Pain  Assessment: 0-10 Pain Score: 5  Pain Location: Back Pain Descriptors / Indicators: Aching Pain Intervention(s): Limited activity within patient's tolerance;Monitored  during session;Repositioned     Hand Dominance Right   Extremity/Trunk Assessment Upper Extremity Assessment Upper Extremity Assessment: Overall WFL for tasks assessed (Strength WFL, sensation symmetrical. Difficulty with finger-to-nose coordination (pt attributing difficutly d/t double vision))   Lower Extremity Assessment Lower Extremity Assessment: Generalized weakness;Defer to PT evaluation       Communication Communication Communication: No difficulties   Cognition Arousal/Alertness: Awake/alert Behavior During Therapy: WFL for tasks assessed/performed Overall Cognitive Status: Within Functional Limits for tasks assessed                                 General Comments: pleasant and agreeable throughout              Home Living Family/patient expects to be discharged to:: Private residence Living Arrangements: Spouse/significant other;Children;Other (Comment) (Grandchildren) Available Help at Discharge: Family;Available 24 hours/day Type of Home: House Home Access: Stairs to enter [employer] of Steps: 3 Entrance Stairs-Rails: Can reach both Home Layout: One level     Bathroom Shower/Tub: Tub/shower unit         Home Equipment: Grab bars - tub/shower;Hand held shower head          Prior Functioning/Environment Level of Independence: Independent        Comments: Independent without AD for functional mobility. 1 fall last week. Independent with ADLs/IADLs. Drives and works. Enjoys spending time with family        OT Problem List: Impaired balance (sitting and/or standing);Impaired vision/perception      OT Treatment/Interventions: Self-care/ADL training;Therapeutic exercise;DME and/or AE instruction;Therapeutic activities;Visual/perceptual remediation/compensation;Patient/family education;Balance training    OT Goals(Current goals can be found in the care plan section) Acute Rehab OT Goals Patient Stated Goal: to cook  again OT Goal Formulation: With patient Time For Goal Achievement: 10/28/20 Potential to Achieve Goals: Fair ADL Goals Pt Will Perform Grooming: with modified independence;standing Pt Will Perform Lower Body Bathing: with modified independence;sit to/from stand Pt Will Perform Tub/Shower Transfer: with modified independence;ambulating;tub bench;grab bars  OT Frequency: Min 1X/week    AM-PAC OT "6 Clicks" Daily Activity     Outcome Measure Help from another person eating meals?: None Help from another person taking care of personal grooming?: A Little Help from another person toileting, which includes using toliet, bedpan, or urinal?: A Little Help from another person bathing (including washing, rinsing, drying)?: A Little Help from another person to put on and taking off regular upper body clothing?: None Help from another person to put on and taking off regular lower body clothing?: A Little 6 Click Score: 20   End of Session Nurse Communication: Mobility status  Activity Tolerance: Patient tolerated treatment well Patient left: in chair;with call bell/phone within reach  OT Visit Diagnosis: Unsteadiness on feet (R26.81);History of falling (Z91.81);Low vision, both eyes (H54.2)                Time: 9678-9381 OT Time Calculation (min): 22 min Charges:  OT General Charges $OT Visit: 1 Visit OT Evaluation $OT Eval Moderate Complexity: 1 Mod OT Treatments $Self Care/Home Management : 8-22 mins  Matthew Folks, OTR/L [facility] [phone]

## 2020-10-14 NOTE — ED Notes (Signed)
[facility] RN aware of assigned bed 

## 2020-10-14 NOTE — Evaluation (Signed)
Clinical/Bedside Swallow Evaluation Patient Details  Name: Henry Anderson MRN: [id number] Date of Birth: [birth year]  Today's Date: 10/14/2020 Time: SLP Start Time (ACUTE ONLY): 1150 SLP Stop Time (ACUTE ONLY): 1218 SLP Time Calculation (min) (ACUTE ONLY): 28 min  Past Medical History:  Past Medical History:  Diagnosis Date   Diabetes mellitus without complication (HCC)    Emphysema of lung (HCC)    Hypertension    Past Surgical History:  Past Surgical History:  Procedure Laterality Date   FOOT SURGERY Right    KNEE SURGERY Right    SHOULDER SURGERY Left    HPI:  Per admitting H & P"Henry Anderson is a 55 y.o. male with medical history significant of DM, emphysema, HTN was seen 10/11/20 for HTN, blurred vision, HA. Eval was unremarkable with no focal neuro findings. CT head was negative. Patient was discharged home. He returns to [facility] for HA and persistent blurry vision. Denies focal weakness, although he has had a fall in the past week, paresthesia, diploplia, swallow difficulty.     ED Course: T 97.6  151/101  92  18. EDP exam notable for blurred vision, unreactive pupil, no focal weakness. Lab reveals glucose of 270, WBC 12.5 with nl diff. 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 Dr. Wilford Corner for neurology consulted by EDP. TRH called to admit patient for continue workup of CVA."    Assessment / Plan / Recommendation  Clinical Impression  Pt presents with functional swallowing abilities at bedside with no overt s/s of aspiration. Oral mech exam revealed structures to be functioning adequately. Noted mild left tongue deviation. Pt was able to masticate soldis without difficulty but did report some difficulty chewing and swallowing pot roast yesterday. Vocal quality remained clear and laryngeal elevation appeared  adequate. Speech and language screened with no apprent deficits to warrent ST services. Rec continue with heart healthy diet. Will add extra gravy or sauce to any dry foods. No further ST f/u at this time. please reconsult if any further difficulties are noted. SLP Visit Diagnosis: Dysphagia, unspecified (R13.10)    Aspiration Risk  No limitations;Mild aspiration risk    Diet Recommendation Regular;Other (Comment) (moisten dry foods)   Liquid Administration via: Cup;Straw Medication Administration: Whole meds with liquid Postural Changes: Remain upright for at least 30 minutes after po intake;Seated upright at 90 degrees    Other  Recommendations Oral Care Recommendations: Patient independent with oral care    Recommendations for follow up therapy are one component of a multi-disciplinary discharge planning process, led by the attending physician.  Recommendations may be updated based on patient status, additional functional criteria and insurance authorization.  Follow up Recommendations    NO ST indicated    Frequency and Duration   N/A         Prognosis: Good        Swallow Study   General Date of Onset: 10/13/20 HPI: Per admitting H & P"Henry Anderson is a 55 y.o. male with medical history significant of DM, emphysema, HTN was seen 10/11/20 for HTN, blurred vision, HA. Eval was unremarkable with no focal neuro findings. CT head was negative. Patient was discharged home. He returns to [facility] for HA and persistent blurry vision. Denies focal weakness, although he has had a fall in the past week, paresthesia, diploplia, swallow difficulty.     ED  Course: T 97.6  151/101  92  18. EDP exam notable for blurred vision, unreactive pupil, no focal weakness. Lab reveals glucose of 270, WBC 12.5 with nl diff. MRI brain w/ 68mm subacute infarct medial right mid-brain and thalamus, chornic small vessel infarcts right internal capusle and thalamus. MRQ w/o LVO, moderate-severe stenosis inferior  proximal M2 branch of right MCA. Dr. Wilford Corner for neurology consulted by EDP. TRH called to admit patient for continue workup of CVA." Type of Study: Bedside Swallow Evaluation Diet Prior to this Study: Regular Temperature Spikes Noted: No Respiratory Status: Room air History of Recent Intubation: No Behavior/Cognition: Alert;Cooperative;Pleasant mood Oral Cavity Assessment: Within Functional Limits Oral Care Completed by SLP: No Oral Cavity - Dentition: Adequate natural dentition;Missing dentition Vision: Functional for self-feeding Self-Feeding Abilities: Needs set up (Vission changes may need some assist) Patient Positioning: Upright in chair Baseline Vocal Quality: Normal    Oral/Motor/Sensory Function Overall Oral Motor/Sensory Function: Within functional limits   Ice Chips Ice chips: Not tested   Thin Liquid Thin Liquid: Within functional limits Presentation: Straw    Nectar Thick Nectar Thick Liquid: Not tested   Honey Thick Honey Thick Liquid: Not tested   Puree Puree: Within functional limits   Solid     Solid: Within functional limits      Eather Colas 10/14/2020,12:18 PM

## 2020-10-14 NOTE — Evaluation (Signed)
Physical Therapy Evaluation Patient Details Name: Henry Anderson MRN: [id number] DOB: 12-30-1965 Today's Date: 10/14/2020  History of Present Illness  Henry Anderson is a 55 y.o. male with medical history significant of DM, emphysema, HTN was seen 10/11/20 for HTN, blurred vision, HA. Eval was unremarkable with no focal neuro findings. CT head was negative. Patient was discharged home. He returns to [facility] for HA and persistent blurry vision. Denies focal weakness, although he has had a fall in the past week, paresthesia, diploplia, swallow difficulty. 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 Admitted for continued CVA workup.   Clinical Impression  Pt is a pleasant 55 year old male who presents to PT evaluation after worsening diplopia and fall in the past week and admitted for CVA workup. Prior to admission, pt was independent in all ADLs/IADLs and did not use any assistive devices. Pt demonstrating ability to complete transfers with SPV/SBA and ambulate with CGA due to increased L lateral lean. Pt's L lateral lean improved with usage of RW for added stability. Pt demonstrating impaired ocular vertical ROM, impaired convergence, diplopia, and impaired dynamic standing balance. Pt unable to maintain tandem stance for >5 seconds bilaterally. Recommending pt discharge home with support of spouse + RW and attend OP PT to improve balance and reduce fall risk. Pt may benefit from trialing eye patch to improve diplopia and further neurophthalmology consult/referral. Pt will continue to benefit from skilled PT services during admission to improve independence and safety with mobility.       Recommendations for follow up therapy are one component of a multi-disciplinary discharge planning process, led by the attending physician.  Recommendations may  be updated based on patient status, additional functional criteria and insurance authorization.  Follow Up Recommendations Outpatient PT;Supervision - Intermittent    Equipment Recommendations  Rolling walker with 5" wheels    Recommendations for Other Services       Precautions / Restrictions Precautions Precautions: Fall Restrictions Weight Bearing Restrictions: No      Mobility  Bed Mobility Overal bed mobility: Modified Independent             General bed mobility comments: Pt received seated in recliner chair    Transfers Overall transfer level: Needs assistance Equipment used: None Transfers: Sit to/from Stand Sit to Stand: Supervision         General transfer comment: Able to stand without AD and no loss of balance with initial standing  Ambulation/Gait Ambulation/Gait assistance: Min guard Gait Distance (Feet): 100 Feet Assistive device: None Gait Pattern/deviations: Step-through pattern;Decreased stride length;Narrow base of support;Drifts right/left     General Gait Details: Increased L lateral lean during ambulation and narrow base of support. Pt reports narrow base of support is his normal gait pattern. No loss of balance with turns both L and R. Pt left lateral lean improved with usage of RW requiring close SBA for balance.  Stairs            Wheelchair Mobility    Modified Rankin (Stroke Patients Only)       Balance Overall balance assessment: Needs assistance Sitting-balance support: No upper extremity supported;Feet supported Sitting balance-Leahy Scale: Normal Sitting balance - Comments: Normal sitting balance at EOB donning/doffing socks   Standing balance support: No upper extremity supported;During functional activity Standing balance-Leahy Scale: Fair Standing balance comment: CGA for balance during ambulation. Pt  able to maintain balance using stepping reaction and CGA     Tandem Stance - Right Leg: 2 Tandem Stance -  Left Leg: 3     High level balance activites: Turns;Head turns High Level Balance Comments: Pt reporting increased dizziness with vertical head turns during ambulation with RW. NO increased dizziness with horizontal head durings with RW. No LOB with turning R or L however, pt completed turns slowly and cautiously. No increased lateral sway with feet together eyes open and closed.             Pertinent Vitals/Pain Pain Assessment: No/denies pain Pain Score: 0-No pain Pain Location: Back Pain Descriptors / Indicators: Aching Pain Intervention(s): Limited activity within patient's tolerance;Monitored during session;Repositioned    Home Living Family/patient expects to be discharged to:: Private residence Living Arrangements: Spouse/significant other;Children;Other (Comment) Available Help at Discharge: Family;Available 24 hours/day Type of Home: House Home Access: Stairs to enter Entrance Stairs-Rails: Can reach both Entrance Stairs-Number of Steps: 3 Home Layout: One level Home Equipment: Grab bars - tub/shower;Hand held shower head      Prior Function Level of Independence: Independent         Comments: 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     Hand Dominance   Dominant Hand: Right    Extremity/Trunk Assessment   Upper Extremity Assessment Upper Extremity Assessment: Defer to OT evaluation    Lower Extremity Assessment Lower Extremity Assessment: Overall WFL for tasks assessed       Communication   Communication: No difficulties  Cognition Arousal/Alertness: Awake/alert Behavior During Therapy: WFL for tasks assessed/performed Overall Cognitive Status: Within Functional Limits for tasks assessed                                 General Comments: pleasant and agreeable throughout      General Comments      Exercises Other Exercises Other Exercises: Pt and spouse educated on PT role and discharge options/recommendations.    Assessment/Plan    PT Assessment Patient needs continued PT services  PT Problem List Decreased balance;Other (comment) (Diplopia)       PT Treatment Interventions DME instruction;Gait training;Stair training;Functional mobility training;Therapeutic activities;Therapeutic exercise;Balance training;Neuromuscular re-education;Patient/family education;Modalities;Manual techniques    PT Goals (Current goals can be found in the Care Plan section)  Acute Rehab PT Goals Patient Stated Goal: to go home so he can cook dinner PT Goal Formulation: With patient/family Time For Goal Achievement: 10/28/20 Potential to Achieve Goals: Fair    Frequency 7X/week   Barriers to discharge        Co-evaluation               AM-PAC PT "6 Clicks" Mobility  Outcome Measure Help needed turning from your back to your side while in a flat bed without using bedrails?: None Help needed moving from lying on your back to sitting on the side of a flat bed without using bedrails?: None Help needed moving to and from a bed to a chair (including a wheelchair)?: A Little Help needed standing up from a chair using your arms (e.g., wheelchair or bedside chair)?: None Help needed to walk in hospital room?: A Little Help needed climbing 3-5 steps with a railing? : A Little 6 Click Score: 21    End of Session Equipment Utilized During Treatment: Gait belt Activity Tolerance: Patient tolerated treatment well Patient left: in chair;with call bell/phone within reach;with family/visitor  present Nurse Communication: Mobility status PT Visit Diagnosis: Unsteadiness on feet (R26.81);Other abnormalities of gait and mobility (R26.89);History of falling (Z91.81)    Time: 1009-1030 PT Time Calculation (min) (ACUTE ONLY): 21 min   Charges:   PT Evaluation $PT Eval Low Complexity: 1 Low PT Treatments $Gait Training: 8-22 mins        Verl Blalock, SPT   Verl Blalock 10/14/2020, 1:00 PM

## 2020-10-14 NOTE — Consult Note (Signed)
Neurology Consultation  Reason for Consult: Blurred vision, stroke on MRI Referring Physician: Dr. Debby Bud  CC: Blurred vision  History is obtained from: Patient, chart  HPI: Henry Anderson is a 55 y.o. male past medical history of diabetes, emphysema, hypertension, ongoing tobacco abuse, who is a truck driver by occupation, presented to the emergency room for evaluation of persistent blurred vision.  Symptoms started last Saturday, October 10, 2020.  He came into the emergency room with blurred vision and headache on 10/11/2020.  Noncontrast head CT was done and he was evaluated in the ER.  No concerning findings either on imaging or clinical exam at that time and was discharged home.  Symptoms did not improve and he has had diplopia in addition to the blurred vision as well as some paresthesias which are now resolved and also complained of mild swallowing difficulty along with a fall in the past week which made him come back to the hospital. 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  I was contacted over the phone for imaging recommendations and recommended that an MRI of the brain with and without contrast as well as an MRA of the head be performed. MRI of the brain was done which revealed a 7 mm acute/early subacute infarct within the medial right midbrain and medial right thalamus along with chronic small vessel infarcts in the posterior limb of the internal capsule and right thalamus.   MR angiography of the head with moderate to severe stenosis within the inferior division of the proximal M2 right MCA vessel as well as moderate stenosis within the proximal P2 left PCA.  Also noted was 1 mm inferiorly projecting vascular protrusion in the paraclinoid left ICA-infundibulum versus small aneurysm. Admit for further stroke work-up   LKW: Sometime on 10/10/2020 tpa given?: no, way outside the window Premorbid modified Rankin scale (mRS): 0   ROS:  Full ROS was performed and is negative except as noted in the HPI.   Past Medical History:  Diagnosis Date   Diabetes mellitus without complication (HCC)    Emphysema of lung (HCC)    Hypertension     Family History  Problem Relation Age of Onset   Diabetes Father    Diabetes Sister    Diabetes Sister      Social History:   reports that he has been smoking cigarettes. He has a 45.00 pack-year smoking history. He has quit using smokeless tobacco.  His smokeless tobacco use included chew. He reports current alcohol use of about 1.0 - 2.0 standard drink per week. He reports that he does not use drugs.  Medications  Current Facility-Administered Medications:     stroke: mapping our early stages of recovery book, , Does not apply, Once, Norins, Rosalyn Gess, MD   0.9 %  sodium chloride infusion, , Intravenous, Continuous, Norins, Rosalyn Gess, MD, Last Rate: 75 mL/hr at 10/14/20 0244, Rate Verify at 10/14/20 0244   acetaminophen (TYLENOL) tablet 650 mg, 650 mg, Oral, Q4H PRN **OR** acetaminophen (TYLENOL) 160 MG/5ML solution 650 mg, 650 mg, Per Tube, Q4H PRN **OR** acetaminophen (TYLENOL) suppository 650 mg, 650 mg, Rectal, Q4H PRN, Norins, Rosalyn Gess, MD   aspirin suppository 300 mg, 300 mg, Rectal, Daily **OR** aspirin tablet 325 mg, 325 mg, Oral, Daily, Norins, Rosalyn Gess, MD, 325 mg at 10/13/20 1717   atorvastatin (LIPITOR) tablet 20 mg, 20 mg, Oral, Daily, Norins, Rosalyn Gess, MD, 20 mg at 10/13/20 1642   enoxaparin (  LOVENOX) injection 40 mg, 40 mg, Subcutaneous, Q24H, Norins, Rosalyn Gess, MD   lisinopril (ZESTRIL) tablet 20 mg, 20 mg, Oral, Daily, 20 mg at 10/13/20 1641 **AND** hydrochlorothiazide (HYDRODIURIL) tablet 25 mg, 25 mg, Oral, Daily, Deal, Adonis Housekeeper, RPH, 25 mg at 10/13/20 1642   mometasone-formoterol (DULERA) 100-5 MCG/ACT inhaler 2 puff, 2 puff, Inhalation, BID, Norins, Rosalyn Gess, MD   nicotine (NICODERM CQ - dosed in mg/24 hours) patch 21 mg, 21 mg, Transdermal, Daily, Norins,  Rosalyn Gess, MD, 21 mg at 10/13/20 1755   senna-docusate (Senokot-S) tablet 1 tablet, 1 tablet, Oral, QHS, Norins, Rosalyn Gess, MD  Current Outpatient Medications:    aspirin EC 81 MG tablet, Take 81 mg by mouth daily., Disp: , Rfl:    fluticasone (FLONASE) 50 MCG/ACT nasal spray, Place 2 sprays into both nostrils daily., Disp: 16 g, Rfl: 0   lisinopril-hydrochlorothiazide (ZESTORETIC) 20-25 MG tablet, Take 1 tablet by mouth daily., Disp: 30 tablet, Rfl: 1   metFORMIN (GLUCOPHAGE) 1000 MG tablet, Take 1 tablet (1,000 mg total) by mouth 2 (two) times daily with a meal., Disp: 60 tablet, Rfl: 1   atorvastatin (LIPITOR) 10 MG tablet, Take 1 tablet by mouth daily. (Patient not taking: Reported on 10/13/2020), Disp: , Rfl:    Exam: Current vital signs: BP 113/87   Pulse 73   Temp 98 F (36.7 C) (Oral)   Resp 18   Ht 5\' 9"  (1.753 m)   Wt 78.9 kg   SpO2 97%   BMI 25.70 kg/m  Vital signs in last 24 hours: Temp:  [97.6 F (36.4 C)-98 F (36.7 C)] 98 F (36.7 C) (10/05 0304) Pulse Rate:  [70-109] 73 (10/05 0800) Resp:  [11-30] 18 (10/05 0800) BP: (110-155)/(74-112) 113/87 (10/05 0800) SpO2:  [93 %-100 %] 97 % (10/05 0800) Weight:  [78.9 kg] 78.9 kg (10/04 1053) GENERAL: Awake, alert in NAD HEENT: - Normocephalic and atraumatic, dry mm, no LN++, no Thyromegally LUNGS - Clear to auscultation bilaterally with no wheezes CV - S1S2 RRR, no m/r/g, equal pulses bilaterally. ABDOMEN - Soft, nontender, nondistended with normoactive BS Ext: warm, well perfused, intact peripheral pulses, no edema  NEURO:  Mental Status: AA&Ox3  Language: speech is not dysarthric.  Naming, repetition, fluency, and comprehension intact. Cranial Nerves: There is a subtle pupillary asymmetry with the right pupil being 4 mm and sluggishly reactive and left pupil being 3 mm, briskly reactive.  Visual fields are full.  Extraocular movement examination is grossly abnormal-he reports of diplopia at neutral gaze with mild  disconjugate gaze.  The diplopia improves looking to either right or left with no restriction of adduction or abduction in each eye.  He has no restriction of downward gaze.  Has bilateral upward gaze restriction. Facial sensation intact.  Auditory acuity intact.  Face is symmetric.  Tongue and palate midline. Motor examination: 5/5 in all fours without drift or weakness. Sensation intact light touch without extinction Coordination with no dysmetria Gait testing was deferred at this time NIH stroke scale-1 for gaze  Labs I have reviewed labs in epic and the results pertinent to this consultation are:  CBC    Component Value Date/Time   WBC 12.5 (H) 10/13/2020 1300   RBC 5.57 10/13/2020 1300   HGB 17.1 (H) 10/13/2020 1300   HGB 15.2 08/06/2012 1005   HCT 47.7 10/13/2020 1300   HCT 43.8 08/06/2012 1005   PLT 279 10/13/2020 1300   PLT 219 08/06/2012 1005   MCV 85.6 10/13/2020 1300  MCV 86 08/06/2012 1005   MCH 30.7 10/13/2020 1300   MCHC 35.8 10/13/2020 1300   RDW 12.4 10/13/2020 1300   RDW 13.3 08/06/2012 1005   LYMPHSABS 2.6 10/13/2020 1300   MONOABS 0.7 10/13/2020 1300   EOSABS 0.2 10/13/2020 1300   BASOSABS 0.1 10/13/2020 1300    CMP     Component Value Date/Time   NA 133 (L) 10/13/2020 1300   NA 137 08/06/2012 1005   K 3.9 10/13/2020 1300   K 4.1 08/06/2012 1005   CL 96 (L) 10/13/2020 1300   CL 102 08/06/2012 1005   CO2 28 10/13/2020 1300   CO2 28 08/06/2012 1005   GLUCOSE 270 (H) 10/13/2020 1300   GLUCOSE 94 08/06/2012 1005   BUN 11 10/13/2020 1300   BUN 7 08/06/2012 1005   CREATININE 0.74 10/13/2020 1300   CREATININE 0.71 08/06/2012 1005   CALCIUM 9.8 10/13/2020 1300   CALCIUM 9.2 08/06/2012 1005   PROT 6.9 11/26/2015 1149   ALBUMIN 3.8 11/26/2015 1149   AST 25 11/26/2015 1149   ALT 17 11/26/2015 1149   ALKPHOS 80 11/26/2015 1149   BILITOT 0.7 11/26/2015 1149   GFRNONAA >60 10/13/2020 1300   GFRNONAA >60 08/06/2012 1005   GFRAA >60 11/26/2015 1149    GFRAA >60 08/06/2012 1005    Lipid Panel     Component Value Date/Time   CHOL 198 10/14/2020 0559   TRIG 446 (H) 10/14/2020 0559   HDL 26 (L) 10/14/2020 0559   CHOLHDL 7.6 10/14/2020 0559   VLDL UNABLE TO CALCULATE IF TRIGLYCERIDE OVER 400 mg/dL 93/71/6967 8938   LDLCALC UNABLE TO CALCULATE IF TRIGLYCERIDE OVER 400 mg/dL 10/26/5100 5852   D7O from August this year 9.1. Unable to calculate LDL due to triglycerides over 400  2D echo-pending  Imaging I have reviewed the images obtained:  CT-head-no acute changes  MRI examination of the brain plus MRA of the head without contrast IMPRESSION: MRI brain:  1. 7 mm acute/early subacute infarct within the medial right midbrain and medial right thalamus. 2. Chronic small-vessel infarcts within the posterior limb of right internal capsule and right thalamus. 3. Background cerebral white matter chronic small-vessel ischemic changes, which are overall mild but advanced for age. Mild chronic small-vessel ischemic changes also present within the pons. 4. Paranasal sinus disease, as described. 5. Bilateral mastoid effusions.  MRA head:  1. No intracranial large vessel occlusion identified. 2. Intracranial atherosclerotic disease, most notably as follows. 3. Moderate/severe stenosis within an inferior division proximal M2 right MCA vessel. 4. Moderate stenosis within the proximal P2 left PCA. 5. 1 mm inferiorly projecting vascular protrusion arising from the paraclinoid left ICA, which may reflect an infundibulum or small aneurysm.  Carotid Dopplers: Minimal atherosclerotic plaque bilaterally, no focal hemodynamically significant stenosis.  Assessment: 55 year old with diabetes, hypertension, tobacco abuse, hyperlipidemia presenting with 3 to 4 days worth of blurred vision which has progressed to diplopia and also complains of some paresthesias which have now resolved as well as some difficulty swallowing noted to have vertical gaze  palsy and MRI that reveals a subacute infarct in the medial right midbrain and medial right thalamus likel responsible for his vertical gaze palsy and mild anisocoria. Stroke likely secondary to small vessel etiology due to uncontrolled risk factors such as diabetes, hypertension hyperlipidemia and tobacco abuse. Outside the window for tPA No emergent LVO on the MRA head MRA head with moderate to severe stenosis in the right MCA and proximal P2 segment of the left PCA  along with generalized intracranial atherosclerotic disease.  Impression: Acute ischemic stroke-likely small vessel etiology Uncontrolled risk factors such as hypertension, diabetes, hyperlipidemia and smoking Hypertriglyceridemia/hyperlipidemia  Recommendations: -I would recommend aspirin 325 and Plavix 75 for 3 months followed by single antiplatelet therapy based on SAMMPRIS trial given his ICAD -Atorvastatin 80 mg now and daily for goal LDL less than 70 -A1c goal less than 7 -Follow-up echocardiogram -Telemetry while in the hospital.  The stroke looks more like a small vessel etiology stroke so I do not feel strongly about pursuing any outpatient extended cardiac rhythm monitoring. -Follow-up PT OT speech therapy recommendations -Counseled the patient extensively on the importance of smoking cessation, blood pressure and glucose strict control as well as cholesterol management.  This will have to continue on an ongoing basis as an outpatient as well.  I will follow-up the echocardiogram results with you.  Follow-up with outpatient neurology in 6 to 8 weeks  Follow-up with ophthalmology for any corrective lenses that might be available to help with his diplopia.  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  Plan relayed to Dr. Gerri Lins via secure chat  -- Milon Dikes, MD Neurologist [facility] Pager: [phone]

## 2020-10-14 NOTE — TOC Initial Note (Addendum)
Transition of Care [facility]) - Initial/Assessment Note    Patient Details  Name: Henry Anderson MRN: [id number] Date of Birth: [birth year]  [facility]) CM/SW Contact:    Marina Goodell Phone Number: [phone] 10/14/2020, 2:19 PM  Clinical Narrative:                  Patient presents to [facility] due to HTN and dizziness. Patient stated he has not been taking his medication, he ran out a year ago. Patient is uninsured.  PT/OT have recommended home health.  CSW contacted [place] home health agency for charity placement. No reply yet. PT also sent a referral to [facility], for PT/OT. Patient's main contact is Alcide, Memoli (Spouse) [phone] [facility]).  Patient stated he is able to perform all ADLs w/out assistance but now he will not be able to work due to stroke.  CSW gave patient resource packet and explained to him he will need to contact DSS to assist with Medicaid.  CSW also stated I would send referral to [employer] for possible disability assistance. CSW sent referral to [facility], w/ Ms. Upadhyay main contact.  Prescriptions sent to [facility].   Expected Discharge Plan: Home w Home Health Services Barriers to Discharge: Inadequate or no insurance   Patient Goals and CMS Choice Patient states their goals for this hospitalization and ongoing recovery are:: To get better so he can work again.      Expected Discharge Plan and Services Expected Discharge Plan: Home w Home Health Services In-house Referral: Clinical Social Work   Post Acute Care Choice: Home Health Living arrangements for the past 2 months: Single Family Home                                      Prior Living Arrangements/Services Living arrangements for the past 2 months: Single Family Home Lives with:: Spouse Patient language and need for interpreter reviewed:: Yes Do you feel safe going back to the place where you live?: Yes       Need for Family Participation in Patient Care: Yes (Comment) Care giver support system in place?: Yes (comment)   Criminal Activity/Legal Involvement Pertinent to Current Situation/Hospitalization: No - Comment as needed  Activities of Daily Living      Permission Sought/Granted Permission sought to share information with : Family Supports    Share Information with NAME: Witt, Plitt (Spouse)   [phone] (Mobile)           Emotional Assessment Appearance:: Appears older than stated age Attitude/Demeanor/Rapport: Engaged Affect (typically observed): Stable Orientation: : Oriented to Self, Oriented to Place, Oriented to  Time, Oriented to Situation Alcohol / Substance Use: Not Applicable Psych Involvement: No (comment)  Admission diagnosis:  CVA (cerebrovascular accident) [facility]) [I63.9] Patient Active Problem List   Diagnosis Date Noted   DM2 (diabetes mellitus, type 2) (HCC) 10/13/2020   HTN (hypertension) 10/13/2020   Emphysema of lung (HCC) 10/13/2020   CVA (cerebral vascular accident) (HCC) 10/13/2020   CVA (cerebrovascular accident) (HCC) 10/13/2020   Tobacco abuse 10/13/2020   PCP:  [facility]:   [facility] [phone] - [place], [place] - [address] [place] [address] Phone: [phone] Fax: [phone]     Social Determinants of Health (SDOH) Interventions    Readmission Risk Interventions No flowsheet data found.

## 2020-10-26 ENCOUNTER — Ambulatory Visit: Payer: Medicaid Other | Admitting: Pharmacy Technician

## 2020-10-26 DIAGNOSIS — Z79899 Other long term (current) drug therapy: Secondary | ICD-10-CM

## 2020-10-29 ENCOUNTER — Telehealth: Payer: Self-pay | Admitting: Gerontology

## 2020-10-29 ENCOUNTER — Other Ambulatory Visit: Payer: Self-pay

## 2020-10-29 NOTE — Progress Notes (Signed)
Completed [facility] application and contract.  Patient agreed to all terms of the [facility] contract.    Patient approved to receive medication assistance at [facility] until time for re-certification in 2023, and as long as eligibility criteria continues to be met.    Provided patient with community resource material based on his particular needs.    Mila Pair J. Lealand Elting Care Manager [facility]  

## 2020-10-29 NOTE — Telephone Encounter (Signed)
-----   Message from Elberta Fortis sent at 10/21/2020  1:41 PM EDT ----- Regarding: FW: [facility] Hello,  Please call patient to follow up with application process.    Thanks  Vonte' ----- Message ----- From: Marina Goodell Sent: 10/14/2020   2:13 PM EDT To: Ishmael Holter Hedgebeth Subject: [facility]                        I gave the patient the application, please contact his wife for scheduling Lisanti,Kimberly [phone] [facility]).  His prescriptions were sent to [facility] management already.  Teresita Maxey CSW. LCSWA. MSW, MA

## 2020-11-03 ENCOUNTER — Ambulatory Visit: Payer: Self-pay | Admitting: Gerontology

## 2020-11-03 ENCOUNTER — Other Ambulatory Visit: Payer: Self-pay

## 2020-11-03 ENCOUNTER — Encounter: Payer: Self-pay | Admitting: Gerontology

## 2020-11-03 VITALS — BP 105/70 | HR 105 | Temp 97.6°F | Resp 18 | Ht 69.0 in | Wt 166.4 lb

## 2020-11-03 DIAGNOSIS — E119 Type 2 diabetes mellitus without complications: Secondary | ICD-10-CM

## 2020-11-03 DIAGNOSIS — Z8709 Personal history of other diseases of the respiratory system: Secondary | ICD-10-CM

## 2020-11-03 DIAGNOSIS — I10 Essential (primary) hypertension: Secondary | ICD-10-CM

## 2020-11-03 DIAGNOSIS — I639 Cerebral infarction, unspecified: Secondary | ICD-10-CM

## 2020-11-03 DIAGNOSIS — Z7689 Persons encountering health services in other specified circumstances: Secondary | ICD-10-CM

## 2020-11-03 DIAGNOSIS — R000 Tachycardia, unspecified: Secondary | ICD-10-CM

## 2020-11-03 MED ORDER — ALBUTEROL SULFATE HFA 108 (90 BASE) MCG/ACT IN AERS
2.0000 | INHALATION_SPRAY | Freq: Four times a day (QID) | RESPIRATORY_TRACT | 2 refills | Status: DC | PRN
  Filled 2020-11-03: qty 8.5, 25d supply, fill #0

## 2020-11-03 MED ORDER — INSULIN PEN NEEDLE 32G X 4 MM MISC
1.0000 | Freq: Every day | 2 refills | Status: DC
  Filled 2020-11-03: qty 100, 100d supply, fill #0
  Filled 2021-01-19: qty 100, 100d supply, fill #1
  Filled 2021-04-25 – 2021-05-03 (×2): qty 100, 100d supply, fill #2

## 2020-11-03 MED ORDER — FLUTICASONE PROPIONATE 50 MCG/ACT NA SUSP
2.0000 | Freq: Every day | NASAL | 0 refills | Status: DC
  Filled 2020-11-03 – 2021-09-22 (×2): qty 16, 60d supply, fill #0
  Filled 2021-09-23: qty 16, 30d supply, fill #0

## 2020-11-03 MED ORDER — METFORMIN HCL 1000 MG PO TABS
1000.0000 mg | ORAL_TABLET | Freq: Two times a day (BID) | ORAL | 1 refills | Status: DC
  Filled 2020-11-03 – 2020-11-10 (×2): qty 60, 30d supply, fill #0

## 2020-11-03 MED ORDER — BLOOD GLUCOSE MONITOR KIT
PACK | 0 refills | Status: DC
  Filled 2020-11-03: qty 1, 30d supply, fill #0

## 2020-11-03 MED ORDER — ATORVASTATIN CALCIUM 20 MG PO TABS
20.0000 mg | ORAL_TABLET | Freq: Every day | ORAL | 2 refills | Status: DC
  Filled 2020-11-03 – 2020-11-09 (×3): qty 30, 30d supply, fill #0
  Filled 2020-12-07: qty 30, 30d supply, fill #1
  Filled 2021-01-06: qty 30, 30d supply, fill #2

## 2020-11-03 MED ORDER — RIGHTEST GS550 BLOOD GLUCOSE VI STRP
ORAL_STRIP | 11 refills | Status: DC
  Filled 2020-11-03: qty 100, 25d supply, fill #0
  Filled 2021-09-22: qty 100, 25d supply, fill #1
  Filled 2021-09-23: qty 100, 25d supply, fill #0
  Filled 2021-10-24: qty 100, 25d supply, fill #1

## 2020-11-03 MED ORDER — MOMETASONE FURO-FORMOTEROL FUM 100-5 MCG/ACT IN AERO
2.0000 | INHALATION_SPRAY | Freq: Two times a day (BID) | RESPIRATORY_TRACT | 0 refills | Status: DC
  Filled 2020-11-03: qty 1, fill #0

## 2020-11-03 MED ORDER — CHLORTHALIDONE 25 MG PO TABS
25.0000 mg | ORAL_TABLET | Freq: Every day | ORAL | 2 refills | Status: DC
Start: 2020-11-03 — End: 2020-12-01
  Filled 2020-11-03: qty 90, 90d supply, fill #0

## 2020-11-03 MED ORDER — RIGHTEST GL300 LANCETS MISC
11 refills | Status: DC
  Filled 2020-11-03: qty 100, 25d supply, fill #0
  Filled 2021-09-22: qty 100, 25d supply, fill #1
  Filled 2021-09-23: qty 100, 25d supply, fill #0
  Filled 2021-10-24: qty 100, 25d supply, fill #1

## 2020-11-03 MED ORDER — LISINOPRIL 20 MG PO TABS
20.0000 mg | ORAL_TABLET | Freq: Every day | ORAL | 2 refills | Status: DC
  Filled 2020-11-03: qty 90, 90d supply, fill #0

## 2020-11-03 MED ORDER — BASAGLAR KWIKPEN 100 UNIT/ML ~~LOC~~ SOPN
10.0000 [IU] | PEN_INJECTOR | Freq: Every day | SUBCUTANEOUS | 2 refills | Status: DC
  Filled 2020-11-03: qty 15, 140d supply, fill #0

## 2020-11-03 MED ORDER — CLOPIDOGREL BISULFATE 75 MG PO TABS
75.0000 mg | ORAL_TABLET | Freq: Every day | ORAL | 2 refills | Status: DC
  Filled 2020-11-03 – 2020-11-09 (×3): qty 30, 30d supply, fill #0
  Filled 2020-12-07: qty 30, 30d supply, fill #1
  Filled 2021-01-06: qty 30, 30d supply, fill #2

## 2020-11-03 NOTE — Patient Instructions (Signed)
Carbohydrate Counting for Diabetes Mellitus, Adult Carbohydrate counting is a method of keeping track of how many carbohydrates you eat. Eating carbohydrates naturally increases the amount of sugar (glucose) in the blood. Counting how many carbohydrates you eat improves your blood glucose control, which helps you manage your diabetes. It is important to know how many carbohydrates you can safely have in each meal. This is different for every person. A dietitian can help you make a meal plan and calculate how many carbohydrates you should have at each meal and snack. What foods contain carbohydrates? Carbohydrates are found in the following foods: Grains, such as breads and cereals. Dried beans and soy products. Starchy vegetables, such as potatoes, peas, and corn. Fruit and fruit juices. Milk and yogurt. Sweets and snack foods, such as cake, cookies, candy, chips, and soft drinks. How do I count carbohydrates in foods? There are two ways to count carbohydrates in food. You can read food labels or learn standard serving sizes of foods. You can use either of the methods or a combination of both. Using the Nutrition Facts label The Nutrition Facts list is included on the labels of almost all packaged foods and beverages in the [place] It includes: The serving size. Information about nutrients in each serving, including the grams (g) of carbohydrate per serving. To use the Nutrition Facts: Decide how many servings you will have. Multiply the number of servings by the number of carbohydrates per serving. The resulting number is the total amount of carbohydrates that you will be having. Learning the standard serving sizes of foods When you eat carbohydrate foods that are not packaged or do not include Nutrition Facts on the label, you need to measure the servings in order to count the amount of carbohydrates. Measure the foods that you will eat with a food scale or measuring cup, if needed. Decide how  many standard-size servings you will eat. Multiply the number of servings by 15. For foods that contain carbohydrates, one serving equals 15 g of carbohydrates. For example, if you eat 2 cups or 10 oz (300 g) of strawberries, you will have eaten 2 servings and 30 g of carbohydrates (2 servings x 15 g = 30 g). For foods that have more than one food mixed, such as soups and casseroles, you must count the carbohydrates in each food that is included. The following list contains standard serving sizes of common carbohydrate-rich foods. Each of these servings has about 15 g of carbohydrates: 1 slice of bread. 1 six-inch (15 cm) tortilla. ? cup or 2 oz (53 g) cooked rice or pasta.  cup or 3 oz (85 g) cooked or canned, drained and rinsed beans or lentils.  cup or 3 oz (85 g) starchy vegetable, such as peas, corn, or squash.  cup or 4 oz (120 g) hot cereal.  cup or 3 oz (85 g) boiled or mashed potatoes, or  or 3 oz (85 g) of a large baked potato.  cup or 4 fl oz (118 mL) fruit juice. 1 cup or 8 fl oz (237 mL) milk. 1 small or 4 oz (106 g) apple.  or 2 oz (63 g) of a medium banana. 1 cup or 5 oz (150 g) strawberries. 3 cups or 1 oz (24 g) popped popcorn. What is an example of carbohydrate counting? To calculate the number of carbohydrates in this sample meal, follow the steps shown below. Sample meal 3 oz (85 g) chicken breast. ? cup or 4 oz (106 g) brown   rice.  cup or 3 oz (85 g) corn. 1 cup or 8 fl oz (237 mL) milk. 1 cup or 5 oz (150 g) strawberries with sugar-free whipped topping. Carbohydrate calculation Identify the foods that contain carbohydrates: Rice. Corn. Milk. Strawberries. Calculate how many servings you have of each food: 2 servings rice. 1 serving corn. 1 serving milk. 1 serving strawberries. Multiply each number of servings by 15 g: 2 servings rice x 15 g = 30 g. 1 serving corn x 15 g = 15 g. 1 serving milk x 15 g = 15 g. 1 serving strawberries x 15 g = 15  g. Add together all of the amounts to find the total grams of carbohydrates eaten: 30 g + 15 g + 15 g + 15 g = 75 g of carbohydrates total. What are tips for following this plan? Shopping Develop a meal plan and then make a shopping list. Buy fresh and frozen vegetables, fresh and frozen fruit, dairy, eggs, beans, lentils, and whole grains. Look at food labels. Choose foods that have more fiber and less sugar. Avoid processed foods and foods with added sugars. Meal planning Aim to have the same amount of carbohydrates at each meal and for each snack time. Plan to have regular, balanced meals and snacks. Where to find more information American Diabetes Association: www.diabetes.org Centers for Disease Control and Prevention: www.cdc.gov Summary Carbohydrate counting is a method of keeping track of how many carbohydrates you eat. Eating carbohydrates naturally increases the amount of sugar (glucose) in the blood. Counting how many carbohydrates you eat improves your blood glucose control, which helps you manage your diabetes. A dietitian can help you make a meal plan and calculate how many carbohydrates you should have at each meal and snack. This information is not intended to replace advice given to you by your health care provider. Make sure you discuss any questions you have with your health care provider. Document Revised: 12/27/2018 Document Reviewed: 12/28/2018 Elsevier Patient Education  2021 [employer] Inc.  

## 2020-11-03 NOTE — Progress Notes (Signed)
New Patient Office Visit  Subjective:  Patient ID: Henry Anderson, male    DOB: [birth year]  Age: 55 y.o. MRN: [id number]  CC:  Chief Complaint  Patient presents with   Establish Care   Diabetes   Hypertension   Hyperlipidemia    HPI Henry Anderson is a 55 y/o male who has history of Type 2 diabetes mellitus, Emphysema, Hypertension, Stroke presents to establish care and evaluation of his chronic condition. He was discharged from the ED on 10/14/20 . He was diagnosed with CVA. He had MRI done on 10/13/20  that showed 7 mm acute/early subacute infarct within the medial right midbrain and medial right thalamus. 2. Chronic small-vessel infarcts within the posterior limb of right internal capsule and right thalamus. 3. Background cerebral white matter chronic small-vessel ischemic changes, which are overall mild but advanced for age. Mild chronic small-vessel ischemic changes also present within the pons. 4. Paranasal sinus disease, as described. 5. Bilateral mastoid effusions. He denies muscle nor motor weakness, but endorses blurry vision and photophobia. He will follow up with an Ophthalmologist on 11/23/20. He denies headache,and  eye pressure. His HgbA1c done on 10/14/20 was 9.7%, he check his blood glucose tid. His fasting reading was 234 mg/dl. He states that he's compliant with his medications and continues to make healthy lifestyle changes. He denies hypoglycemic symptoms , peripheral neuropathy,but admits to hyperglycemic symptoms. His heart rate was elevated during visit, he denies chest pain, palpitation, states that he only drinks 2 8 oz bottle of water daily. He states that his breathing is stable, smokes 5 cigarettes daily and admits the desire to quit. Overall, he states that he's doing well and offers no further complaint.  Past Medical History:  Diagnosis Date   Diabetes mellitus without complication ([place])    Emphysema of lung ([place])    Hypertension    Stroke [facility])     Past  Surgical History:  Procedure Laterality Date   FOOT SURGERY Right    KNEE SURGERY Right    SHOULDER SURGERY Left     Family History  Problem Relation Age of Onset   COPD Mother    Diabetes Father    Diabetes Sister    Diabetes Sister    COPD Maternal Grandmother     Social History   Socioeconomic History   Marital status: Married    Spouse name: Not on file   Number of children: Not on file   Years of education: Not on file   Highest education level: Not on file  Occupational History   Not on file  Tobacco Use   Smoking status: Every Day    Packs/day: 1.50    Years: 30.00    Pack years: 45.00    Types: Cigarettes   Smokeless tobacco: Former    Types: Chew   Tobacco comments:    Patient has cut down to ~1 ppd since having stroke. Gave [place] Quit info.  Vaping Use   Vaping Use: Never used  Substance and Sexual Activity   Alcohol use: Not Currently    Comment: occasionally 1-2 beers   Drug use: Never   Sexual activity: Not on file  Other Topics Concern   Not on file  Social History Narrative   Not on file   Social Determinants of Health   Financial Resource Strain: Not on file  Food Insecurity: No Food Insecurity   Worried About [place] in the Last Year: Never true   Ran  Out of Food in the Last Year: Never true  Transportation Needs: No Transportation Needs   Lack of Transportation (Medical): No   Lack of Transportation (Non-Medical): No  Physical Activity: Not on file  Stress: Not on file  Social Connections: Not on file  Intimate Partner Violence: Not on file    ROS Review of Systems  Constitutional: Negative.   HENT: Negative.    Eyes:  Positive for visual disturbance (blurry vision and blurry sometimes.).  Respiratory: Negative.    Cardiovascular: Negative.   Gastrointestinal: Negative.   Endocrine: Positive for polydipsia, polyphagia and polyuria.  Genitourinary: Negative.   Musculoskeletal: Negative.   Skin: Negative.    Neurological: Negative.   Hematological: Negative.   Psychiatric/Behavioral: Negative.     Objective:   Today's Vitals: BP 105/70 (BP Location: Left Arm, Patient Position: Sitting, Cuff Size: Normal)   Pulse (!) 105   Temp 97.6 F (36.4 C) (Oral)   Resp 18   Ht 5' 9"  (1.753 m)   Wt 166 lb 6.4 oz (75.5 kg)   SpO2 96%   BMI 24.57 kg/m   Physical Exam HENT:     Head: Normocephalic and atraumatic.     Mouth/Throat:     Mouth: Mucous membranes are moist.  Eyes:     Extraocular Movements:     Right eye: Abnormal extraocular motion present.     Left eye: Abnormal extraocular motion present.     Conjunctiva/sclera: Conjunctivae normal.     Pupils: Pupils are equal, round, and reactive to light.   Cardiovascular:     Rate and Rhythm: Tachycardia present.     Pulses: Normal pulses.     Heart sounds: Normal heart sounds.  Pulmonary:     Effort: Pulmonary effort is normal.     Breath sounds: Normal breath sounds.  Abdominal:     General: Bowel sounds are normal.     Palpations: Abdomen is soft.  Genitourinary:    Comments: Deferred per patient Musculoskeletal:        General: Normal range of motion.     Cervical back: Normal range of motion.  Skin:    General: Skin is warm.  Neurological:     General: No focal deficit present.     Mental Status: He is alert and oriented to person, place, and time. Mental status is at baseline.  Psychiatric:        Mood and Affect: Mood normal.        Behavior: Behavior normal.        Thought Content: Thought content normal.        Judgment: Judgment normal.    Assessment & Plan:   1. Cerebrovascular accident (CVA), unspecified mechanism ([place]) -He will continue his current treatment regimen, will follow up with Neurology. He was advised to go to the ED with hematuria, hematochezia or active bleeding. - atorvastatin (LIPITOR) 20 MG tablet; Take 1 tablet (20 mg total) by mouth once daily.  Dispense: 30 tablet; Refill: 2 - clopidogrel  (PLAVIX) 75 MG tablet; Take 1 tablet (75 mg total) by mouth once daily.  Dispense: 30 tablet; Refill: 2 - Ambulatory referral to Neurology  2. Hypertension, unspecified type - His blood pressure is improving, he will continue on current medication, DASH diet. - lisinopril (ZESTRIL) 20 MG tablet; Take 1 tablet (20 mg total) by mouth once daily.  Dispense: 30 tablet; Refill: 2 - chlorthalidone (HYGROTON) 25 MG tablet; Take 1 tablet (25 mg total) by mouth once daily.  Dispense:  30 tablet; Refill: 2  3. Type 2 diabetes mellitus without complication, without long-term current use of insulin (HCC) - His HgbA1c ws 9.7%, his goal should be less than 7%. He will continue on 10 units of Glargine, educated on medication side effects and advised to notify clinic. He was encouraged to check blood glucose tid, record and bring log to follow up appointment, his fasting reading goal should be between 80-130 mg/dl. He will continue on low carb/non concentrated sweet diet and exercise as tolerated. - blood glucose meter kit and supplies KIT; Use up to four times daily as directed to check blood sugar.  Dispense: 1 each; Refill: 0 - Insulin Glargine (BASAGLAR KWIKPEN) 100 UNIT/ML; Inject 10 Units into the skin once daily.  Dispense: 15 mL; Refill: 2 - metFORMIN (GLUCOPHAGE) 1000 MG tablet; Take 1 tablet (1,000 mg total) by mouth 2 (two) times daily with a meal.  Dispense: 60 tablet; Refill: 1 - Ambulatory referral to Ophthalmology - Insulin Pen Needle 32G X 4 MM MISC; Use with Basaglar insulin pen.  Dispense: 100 each; Refill: 2  4. Encounter to establish care - He was encouraged to continue on Hospital discharge instructions.  5. History of COPD - His breathing is stable, he will continue on current medication, smoking cessation was advised. He was provided with the [place] Quit line information and was advised to call. - fluticasone (FLONASE) 50 MCG/ACT nasal spray; Place 2 sprays into both nostrils once daily.   Dispense: 16 g; Refill: 0 - mometasone-formoterol (DULERA) 100-5 MCG/ACT AERO; Inhale 2 puffs into the lungs 2 (two) times daily.  Dispense: 1 each; Refill: 0    6. Tachycardia - He was encouraged to increase water intake, advised to go to the ED for worsening symptoms.    Follow-up: Return in about 4 weeks (around 12/01/2020), or if symptoms worsen or fail to improve.   Ario Mcdiarmid Jerold Coombe, NP

## 2020-11-04 DIAGNOSIS — R000 Tachycardia, unspecified: Secondary | ICD-10-CM | POA: Insufficient documentation

## 2020-11-06 ENCOUNTER — Other Ambulatory Visit: Payer: Self-pay

## 2020-11-09 ENCOUNTER — Other Ambulatory Visit: Payer: Self-pay

## 2020-11-10 ENCOUNTER — Other Ambulatory Visit: Payer: Self-pay

## 2020-11-19 NOTE — Discharge Summary (Signed)
Physician Discharge Summary  Henry Anderson:[id number] DOB: 01-Mar-1965 DOA: 10/13/2020  PCP: [facility]  Admit date: 10/13/2020 Discharge date: 10/14/2020  Recommendations for Outpatient Follow-up:  Discharged to home Heart healthy/Carbohydrate modified diet Follow up with PCP in -10 days.  Have chemistry drawn at that visit and reported to PCP Stop smoking ASA 325 and Plavix 75 mg x 3 months. Follow up with outpatient neurology in 6-8 weeks. Follow up with ophthalmology No driving  Discharge Diagnoses: Principal diagnosis is #1 CVA DM II Hypertension Emphysema Tobacco abuse   Discharge Condition: Fair Disposition: Home  Diet recommendation: Heart healthy/carbohydrate modified  Filed Weights   10/13/20 1053  Weight: 78.9 kg    History of present illness:  Henry Anderson is a 55 y.o. male with medical history significant of DM, emphysema, HTN was seen 10/11/20 for HTN, blurred vision, HA. Eval was unremarkable with no focal neuro findings. CT head was negative. Patient was discharged home. He returns to [facility]ED for HA and persistent blurry vision. Denies focal weakness, although he has had a fall in the past week, paresthesia, diploplia, swallow difficulty.   ED Course: T 97.6  151/101  92  18. EDP exam notable for blurred vision, unreactive pupil, no focal weakness. Lab reveals glucose of 270, WBC 12.5 with nl diff. MRI brain w/ 23mm subacute infarct medial right mid-brain and thalamus, chornic small vessel infarcts right internal capusle and thalamus. 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 Dr. Wilford Corner for neurology consulted by EDP. [facility] called to admit patient for continue workup of CVA.  Hospital Course: The patient was admitted to a telemetry bed. A stroke work up was initiated. MRI of the brain demonstrated a 7 mm acute/early subacute infarct within the medial right midbrain and medial right thalamus. There were chronic  small-vessel infarcts within the posterior limb of right internal capsule and right thalamus. There was also paranasal sinus disease and bilateral mastoid effusions. MRA demonstrated no intracranial large vessel occlusion identified. There was moderate severe stenosis within an inferior division proximal M2 right MCA vessel. There is moderate stenosis within the proximal P2 left PCA. There was a 1 mm inferiorly projecting vascular protrusion arising from the paraclinioid left ICA which may reflect an infundibulum or small aneurysm. Bilateral carotid dopplers demonstrated minimal atherosclerotic plaque bilaterally. No focal hemodynamically significant stenosis is noted. Echocardiogram demonstrated 60-65% with normal LV function without regional wall motion abnormalities. RV systolic function is normal. The mitral valve is normal in structure. Aortic alve is tricuspid. No LVH. Normal diastolic parameters.  The patient was cleared for discharge to home by neurology.   Today's assessment: S: The patient is resting comfortably. No new complaints. O: Vitals:  Vitals:   10/14/20 1200 10/14/20 1526  BP: (!) 132/92 (!) 135/93  Pulse: 95 95  Resp: 18 18  Temp:  98.1 F (36.7 C)  SpO2: 94% 96%    Exam:  Constitutional:  The patient is awake, alert, and oriented x 3. No acute distress. Respiratory:  No increased work of breathing. No wheezes, rales, or rhonchi No tactile fremitus Cardiovascular:  Regular rate and rhythm No murmurs, ectopy, or gallups. No lateral PMI. No thrills. Abdomen:  Abdomen is soft, non-tender, non-distended No hernias, masses, or organomegaly Normoactive bowel sounds.  Musculoskeletal:  No cyanosis, clubbing, or edema Skin:  No rashes, lesions, ulcers palpation of skin: no induration or nodules Neurologic:  CN 2-12 intact Sensation all 4 extremities intact Psychiatric:  Mental status Mood, affect appropriate Orientation to person, place, time  judgment and  insight appear intact   Discharge Instructions  Discharge Instructions     Activity as tolerated - No restrictions   Complete by: As directed    Call MD for:  persistant nausea and vomiting   Complete by: As directed    Call MD for:  severe uncontrolled pain   Complete by: As directed    Diet - low sodium heart healthy   Complete by: As directed    Increase activity slowly   Complete by: As directed       Allergies as of 10/14/2020   No Known Allergies      Medication List     STOP taking these medications    aspirin EC 81 MG tablet Replaced by: aspirin 325 MG tablet   atorvastatin 10 MG tablet Commonly known as: LIPITOR   fluticasone 50 MCG/ACT nasal spray Commonly known as: FLONASE       TAKE these medications    acetaminophen 325 MG tablet Commonly known as: TYLENOL Take 2 tablets (650 mg total) by mouth every 4 (four) hours as needed for mild pain (or temp > 37.5 C (99.5 F)).   aspirin 325 MG tablet Take 1 tablet (325 mg total) by mouth daily. Replaces: aspirin EC 81 MG tablet   nicotine 21 mg/24hr patch Commonly known as: NICODERM CQ - dosed in mg/24 hours Place 1 patch (21 mg total) onto the skin daily.   senna-docusate 8.6-50 MG tablet Commonly known as: Senokot-S Take 1 tablet by mouth at bedtime.       No Known Allergies  The results of significant diagnostics from this hospitalization (including imaging, microbiology, ancillary and laboratory) are listed below for reference.    Significant Diagnostic Studies: No results found.  Microbiology: No results found for this or any previous visit (from the past 240 hour(s)).   Labs: Basic Metabolic Panel: No results for input(s): NA, K, CL, CO2, GLUCOSE, BUN, CREATININE, CALCIUM, MG, PHOS in the last 168 hours. Liver Function Tests: No results for input(s): AST, ALT, ALKPHOS, BILITOT, PROT, ALBUMIN in the last 168 hours. No results for input(s): LIPASE, AMYLASE in the last 168 hours. No  results for input(s): AMMONIA in the last 168 hours. CBC: No results for input(s): WBC, NEUTROABS, HGB, HCT, MCV, PLT in the last 168 hours. Cardiac Enzymes: No results for input(s): CKTOTAL, CKMB, CKMBINDEX, TROPONINI in the last 168 hours. BNP: BNP (last 3 results) No results for input(s): BNP in the last 8760 hours.  ProBNP (last 3 results) No results for input(s): PROBNP in the last 8760 hours.  CBG: No results for input(s): GLUCAP in the last 168 hours.  Active Problems:   DM2 (diabetes mellitus, type 2) (HCC)   HTN (hypertension)   Emphysema of lung (HCC)   CVA (cerebral vascular accident) (HCC)   CVA (cerebrovascular accident) (HCC)   Tobacco abuse   Time coordinating discharge: 38 minutes.  Signed:        Shizuye Rupert, DO [facility]  11/19/2020, 6:14 AM

## 2020-12-01 ENCOUNTER — Encounter: Payer: Self-pay | Admitting: Gerontology

## 2020-12-01 ENCOUNTER — Ambulatory Visit: Payer: Medicaid Other | Admitting: Gerontology

## 2020-12-01 ENCOUNTER — Other Ambulatory Visit: Payer: Self-pay

## 2020-12-01 VITALS — BP 124/85 | HR 91 | Temp 98.2°F | Resp 18 | Ht 69.0 in | Wt 171.6 lb

## 2020-12-01 DIAGNOSIS — E119 Type 2 diabetes mellitus without complications: Secondary | ICD-10-CM

## 2020-12-01 DIAGNOSIS — R000 Tachycardia, unspecified: Secondary | ICD-10-CM

## 2020-12-01 DIAGNOSIS — Z8709 Personal history of other diseases of the respiratory system: Secondary | ICD-10-CM

## 2020-12-01 DIAGNOSIS — I10 Essential (primary) hypertension: Secondary | ICD-10-CM

## 2020-12-01 LAB — GLUCOSE, POCT (MANUAL RESULT ENTRY): POC Glucose: 148 mg/dl — AB (ref 70–99)

## 2020-12-01 MED ORDER — METFORMIN HCL 1000 MG PO TABS
1000.0000 mg | ORAL_TABLET | Freq: Two times a day (BID) | ORAL | 2 refills | Status: DC
  Filled 2020-12-01 – 2020-12-07 (×2): qty 60, 30d supply, fill #0
  Filled 2021-01-06: qty 60, 30d supply, fill #1

## 2020-12-01 MED ORDER — MOMETASONE FURO-FORMOTEROL FUM 100-5 MCG/ACT IN AERO
2.0000 | INHALATION_SPRAY | Freq: Two times a day (BID) | RESPIRATORY_TRACT | 2 refills | Status: DC
  Filled 2020-12-01 – 2021-10-24 (×4): qty 1, fill #0

## 2020-12-01 MED ORDER — CHLORTHALIDONE 25 MG PO TABS
25.0000 mg | ORAL_TABLET | Freq: Every day | ORAL | 2 refills | Status: DC
  Filled 2020-12-01: qty 30, 30d supply, fill #0
  Filled 2021-02-06: qty 90, 90d supply, fill #0

## 2020-12-01 MED ORDER — LISINOPRIL 20 MG PO TABS
20.0000 mg | ORAL_TABLET | Freq: Every day | ORAL | 2 refills | Status: DC
  Filled 2020-12-01 – 2021-01-06 (×2): qty 30, 30d supply, fill #0
  Filled 2021-02-06: qty 90, 90d supply, fill #0

## 2020-12-01 NOTE — Progress Notes (Signed)
Established Patient Office Visit  Subjective:  Patient ID: Henry Anderson, male    DOB: Oct 09, 1965  Age: 55 y.o. MRN: [id number]  CC:  Chief Complaint  Patient presents with   Follow-up    Labs drawn 10/14/20 by another provider   Diabetes    Patient is checking his blood sugars at home and fasting 120-125.    HPI Henry Anderson is a 55 y/o male who has history of Type 2 diabetes mellitus, Emphysema, Hypertension, Stroke presents for follow up visit. His HgbA1c done on 10/14/20 was 9.7%. He states that he's compliant with his care, denies side effects adheres to ADA diet. He checks his blood glucose tid, and it's usually between 120-125 mg/dl. He denies hypo/hyperglycemic symptoms, peripheral neuropathy and performs daily foot checks. He  will follow up with Neurology on 01/07/21  due to CVA. He denies muscle nor motor weakness. He continues to experience intermittent blurry vision, but states that it has not worsened and will follow up at [facility] on [facility]14/21 at [facility]. His heart rate was elevated during visit, denies palpitation, shortness of breath, and fatigue. He continues to smoke 1 pack of cigarette daily and admits the desire to quit. Overall, he states that he's doing well and offers no further complaint.  Past Medical History:  Diagnosis Date   Diabetes mellitus without complication ([place])    Emphysema of lung ([place])    Hypertension    Stroke [facility])     Past Surgical History:  Procedure Laterality Date   FOOT SURGERY Right    KNEE SURGERY Right    SHOULDER SURGERY Left     Family History  Problem Relation Age of Onset   COPD Mother    Diabetes Father    Diabetes Sister    Diabetes Sister    COPD Maternal Grandmother     Social History   Socioeconomic History   Marital status: Married    Spouse name: Not on file   Number of children: Not on file   Years of education: Not on file   Highest education level: Not on file  Occupational History   Not on file   Tobacco Use   Smoking status: Every Day    Packs/day: 1.50    Years: 30.00    Pack years: 45.00    Types: Cigarettes   Smokeless tobacco: Former    Types: Chew   Tobacco comments:    Patient has cut down to ~1 ppd since having stroke. Gave [place] Quit info.  Vaping Use   Vaping Use: Never used  Substance and Sexual Activity   Alcohol use: Yes    Comment: occasionally 1-2 beers   Drug use: Never   Sexual activity: Not on file  Other Topics Concern   Not on file  Social History Narrative   Not on file   Social Determinants of Health   Financial Resource Strain: Not on file  Food Insecurity: No Food Insecurity   Worried About Running Out of Food in the Last Year: Never true   Ran Out of Food in the Last Year: Never true  Transportation Needs: No Transportation Needs   Lack of Transportation (Medical): No   Lack of Transportation (Non-Medical): No  Physical Activity: Not on file  Stress: Not on file  Social Connections: Not on file  Intimate Partner Violence: Not on file    Outpatient Medications Prior to Visit  Medication Sig Dispense Refill   acetaminophen (TYLENOL) 325 MG tablet Take  2 tablets (650 mg total) by mouth every 4 (four) hours as needed for mild pain (or temp > 37.5 C (99.5 F)).     albuterol (VENTOLIN HFA) 108 (90 Base) MCG/ACT inhaler Inhale 2 puffs into the lungs every 6 (six) hours as needed for wheezing or shortness of breath. 8.5 g 2   atorvastatin (LIPITOR) 20 MG tablet Take 1 tablet (20 mg total) by mouth once daily. 30 tablet 2   blood glucose meter kit and supplies KIT Use up to four times daily as directed to check blood sugar. 1 each 0   clopidogrel (PLAVIX) 75 MG tablet Take 1 tablet (75 mg total) by mouth once daily. 30 tablet 2   fluticasone (FLONASE) 50 MCG/ACT nasal spray Place 2 sprays into both nostrils once daily. 16 g 0   glucose blood (RIGHTEST GS550 BLOOD GLUCOSE) test strip Use up to four times daily as directed to check blood sugar. 100  each 11   Insulin Glargine (BASAGLAR KWIKPEN) 100 UNIT/ML Inject 10 Units into the skin once daily. 15 mL 2   Insulin Pen Needle 32G X 4 MM MISC Use with Basaglar insulin pen. 100 each 2   Rightest GL300 Lancets MISC Use up to four times daily as directed to check blood sugar. 100 each 11   chlorthalidone (HYGROTON) 25 MG tablet Take 1 tablet (25 mg total) by mouth once daily. 30 tablet 2   lisinopril (ZESTRIL) 20 MG tablet Take 1 tablet (20 mg total) by mouth once daily. 30 tablet 2   metFORMIN (GLUCOPHAGE) 1000 MG tablet Take 1 tablet (1,000 mg total) by mouth 2 (two) times daily with a meal. 60 tablet 1   mometasone-formoterol (DULERA) 100-5 MCG/ACT AERO Inhale 2 puffs into the lungs 2 (two) times daily. 1 each 0   aspirin 325 MG tablet Take 1 tablet (325 mg total) by mouth daily. (Patient not taking: Reported on 12/01/2020) 21 tablet 0   nicotine (NICODERM CQ - DOSED IN MG/24 HOURS) 21 mg/24hr patch Place 1 patch (21 mg total) onto the skin daily. (Patient not taking: Reported on 11/03/2020) 28 patch 0   senna-docusate (SENOKOT-S) 8.6-50 MG tablet Take 1 tablet by mouth at bedtime. 30 tablet 0   No facility-administered medications prior to visit.    No Known Allergies  ROS Review of Systems  Constitutional: Negative.   HENT: Negative.    Eyes:  Positive for visual disturbance (blurry vision).  Respiratory: Negative.    Cardiovascular: Negative.   Endocrine: Negative.   Neurological: Negative.   Psychiatric/Behavioral: Negative.       Objective:    Physical Exam HENT:     Head: Normocephalic and atraumatic.  Eyes:     Extraocular Movements: Extraocular movements intact.     Conjunctiva/sclera: Conjunctivae normal.     Pupils: Pupils are equal, round, and reactive to light.  Cardiovascular:     Rate and Rhythm: Normal rate and regular rhythm.     Pulses: Normal pulses.     Heart sounds: Normal heart sounds.  Pulmonary:     Effort: Pulmonary effort is normal.     Breath  sounds: Normal breath sounds.  Skin:    General: Skin is warm.  Neurological:     General: No focal deficit present.     Mental Status: He is alert and oriented to person, place, and time. Mental status is at baseline.    BP 124/85 (BP Location: Left Arm, Patient Position: Sitting, Cuff Size: Large)   Pulse  91   Temp 98.2 F (36.8 C) (Oral)   Resp 18   Ht _0  (1.753 m)   Wt 171 lb 9.6 oz (77.8 kg)   SpO2 97%   BMI 25.34 kg/m  Wt Readings from Last 3 Encounters:  12/01/20 171 lb 9.6 oz (77.8 kg)  11/03/20 166 lb 6.4 oz (75.5 kg)  10/13/20 174 lb (78.9 kg)     Health Maintenance Due  Topic Date Due   COVID-19 Vaccine (1) Never done   Pneumococcal Vaccine 53-33 Years old (1 - PCV) Never done   FOOT EXAM  Never done   OPHTHALMOLOGY EXAM  Never done   Hepatitis C Screening  Never done   Zoster Vaccines- Shingrix (1 of 2) Never done   COLONOSCOPY (Pts 45-66yr Insurance coverage will need to be confirmed)  Never done   INFLUENZA VACCINE  Never done    There are no preventive care reminders to display for this patient.  No results found for: TSH Lab Results  Component Value Date   WBC 12.5 (H) 10/13/2020   HGB 17.1 (H) 10/13/2020   HCT 47.7 10/13/2020   MCV 85.6 10/13/2020   PLT 279 10/13/2020   Lab Results  Component Value Date   NA 133 (L) 10/13/2020   K 3.9 10/13/2020   CO2 28 10/13/2020   GLUCOSE 270 (H) 10/13/2020   BUN 11 10/13/2020   CREATININE 0.74 10/13/2020   BILITOT 0.7 11/26/2015   ALKPHOS 80 11/26/2015   AST 25 11/26/2015   ALT 17 11/26/2015   PROT 6.9 11/26/2015   ALBUMIN 3.8 11/26/2015   CALCIUM 9.8 10/13/2020   ANIONGAP 9 10/13/2020   Lab Results  Component Value Date   CHOL 198 10/14/2020   Lab Results  Component Value Date   HDL 26 (L) 10/14/2020   Lab Results  Component Value Date   LDLCALC UNABLE TO CALCULATE IF TRIGLYCERIDE OVER 400 mg/dL 10/14/2020   Lab Results  Component Value Date   TRIG 446 (H) 10/14/2020   Lab  Results  Component Value Date   CHOLHDL 7.6 10/14/2020   Lab Results  Component Value Date   HGBA1C 9.7 (H) 10/14/2020      Assessment & Plan:    1. Hypertension, unspecified type - his blood pressure is under control, he will continue on current medication, DASH diet and exercise as tolerated. - chlorthalidone (HYGROTON) 25 MG tablet; Take 1 tablet (25 mg total) by mouth once daily.  Dispense: 30 tablet; Refill: 2 - lisinopril (ZESTRIL) 20 MG tablet; Take 1 tablet (20 mg total) by mouth once daily.  Dispense: 30 tablet; Refill: 2  2. Type 2 diabetes mellitus without complication, without long-term current use of insulin (HCC) - His blood glucose is improving though his goal HgbA1c is less than 7%. He will continue on current medication, low carb/non concentrated sweet diet and exercise as tolerated. - metFORMIN (GLUCOPHAGE) 1000 MG tablet; Take 1 tablet (1,000 mg total) by mouth 2 (two) times daily with a meal.  Dispense: 60 tablet; Refill: 2 - POCT Glucose (CBG); Future - HgB A1c; Future - POCT Glucose (CBG)  3. History of COPD - His breathing is normal, will continue on current medication and was strongly advised on smoking cessation. - mometasone-formoterol (DULERA) 100-5 MCG/ACT AERO; Inhale 2 puffs into the lungs 2 (two) times daily.  Dispense: 1 each; Refill: 2  4. Tachycardia - His heart rate was 91 b.p.m after drinking water. He was advised to increase fluid intake, and  go to the ED with worsening symptoms.    Follow-up: Return in about 7 weeks (around 01/19/2021), or if symptoms worsen or fail to improve.    Aquil Duhe Jerold Coombe, NP

## 2020-12-01 NOTE — Patient Instructions (Signed)
DASH Eating Plan °DASH stands for Dietary Approaches to Stop Hypertension. The DASH eating plan is a healthy eating plan that has been shown to: °Reduce high blood pressure (hypertension). °Reduce your risk for type 2 diabetes, heart disease, and stroke. °Help with weight loss. °What are tips for following this plan? °Reading food labels °Check food labels for the amount of salt (sodium) per serving. Choose foods with less than 5 percent of the Daily Value of sodium. Generally, foods with less than 300 milligrams (mg) of sodium per serving fit into this eating plan. °To find whole grains, look for the word "whole" as the first word in the ingredient list. °Shopping °Buy products labeled as "low-sodium" or "no salt added." °Buy fresh foods. Avoid canned foods and pre-made or frozen meals. °Cooking °Avoid adding salt when cooking. Use salt-free seasonings or herbs instead of table salt or sea salt. Check with your health care provider or pharmacist before using salt substitutes. °Do not fry foods. Cook foods using healthy methods such as baking, boiling, grilling, roasting, and broiling instead. °Cook with heart-healthy oils, such as olive, canola, avocado, soybean, or sunflower oil. °Meal planning ° °Eat a balanced diet that includes: °4 or more servings of fruits and 4 or more servings of vegetables each day. Try to fill one-half of your plate with fruits and vegetables. °6-8 servings of whole grains each day. °Less than 6 oz (170 g) of lean meat, poultry, or fish each day. A 3-oz (85-g) serving of meat is about the same size as a deck of cards. One egg equals 1 oz (28 g). °2-3 servings of low-fat dairy each day. One serving is 1 cup (237 mL). °1 serving of nuts, seeds, or beans 5 times each week. °2-3 servings of heart-healthy fats. Healthy fats called omega-3 fatty acids are found in foods such as walnuts, flaxseeds, fortified milks, and eggs. These fats are also found in cold-water fish, such as sardines, salmon,  and mackerel. °Limit how much you eat of: °Canned or prepackaged foods. °Food that is high in trans fat, such as some fried foods. °Food that is high in saturated fat, such as fatty meat. °Desserts and other sweets, sugary drinks, and other foods with added sugar. °Full-fat dairy products. °Do not salt foods before eating. °Do not eat more than 4 egg yolks a week. °Try to eat at least 2 vegetarian meals a week. °Eat more home-cooked food and less restaurant, buffet, and fast food. °Lifestyle °When eating at a restaurant, ask that your food be prepared with less salt or no salt, if possible. °If you drink alcohol: °Limit how much you use to: °0-1 drink a day for women who are not pregnant. °0-2 drinks a day for men. °Be aware of how much alcohol is in your drink. In the [place] one drink equals one 12 oz bottle of beer (355 mL), one 5 oz glass of wine (148 mL), or one 1½ oz glass of hard liquor (44 mL). °General information °Avoid eating more than 2,300 mg of salt a day. If you have hypertension, you may need to reduce your sodium intake to 1,500 mg a day. °Work with your health care provider to maintain a healthy body weight or to lose weight. Ask what an ideal weight is for you. °Get at least 30 minutes of exercise that causes your heart to beat faster (aerobic exercise) most days of the week. Activities may include walking, swimming, or biking. °Work with your health care provider or dietitian to   adjust your eating plan to your individual calorie needs. °What foods should I eat? °Fruits °All fresh, dried, or frozen fruit. Canned fruit in natural juice (without added sugar). °Vegetables °Fresh or frozen vegetables (raw, steamed, roasted, or grilled). Low-sodium or reduced-sodium tomato and vegetable juice. Low-sodium or reduced-sodium tomato sauce and tomato paste. Low-sodium or reduced-sodium canned vegetables. °Grains °Whole-grain or whole-wheat bread. Whole-grain or whole-wheat pasta. Brown rice. Oatmeal. Quinoa.  Bulgur. Whole-grain and low-sodium cereals. Pita bread. Low-fat, low-sodium crackers. Whole-wheat flour tortillas. °Meats and other proteins °Skinless chicken or turkey. Ground chicken or turkey. Pork with fat trimmed off. Fish and seafood. Egg whites. Dried beans, peas, or lentils. Unsalted nuts, nut butters, and seeds. Unsalted canned beans. Lean cuts of beef with fat trimmed off. Low-sodium, lean precooked or cured meat, such as sausages or meat loaves. °Dairy °Low-fat (1%) or fat-free (skim) milk. Reduced-fat, low-fat, or fat-free cheeses. Nonfat, low-sodium ricotta or cottage cheese. Low-fat or nonfat yogurt. Low-fat, low-sodium cheese. °Fats and oils °Soft margarine without trans fats. Vegetable oil. Reduced-fat, low-fat, or light mayonnaise and salad dressings (reduced-sodium). Canola, safflower, olive, avocado, soybean, and sunflower oils. Avocado. °Seasonings and condiments °Herbs. Spices. Seasoning mixes without salt. °Other foods °Unsalted popcorn and pretzels. Fat-free sweets. °The items listed above may not be a complete list of foods and beverages you can eat. Contact a dietitian for more information. °What foods should I avoid? °Fruits °Canned fruit in a light or heavy syrup. Fried fruit. Fruit in cream or butter sauce. °Vegetables °Creamed or fried vegetables. Vegetables in a cheese sauce. Regular canned vegetables (not low-sodium or reduced-sodium). Regular canned tomato sauce and paste (not low-sodium or reduced-sodium). Regular tomato and vegetable juice (not low-sodium or reduced-sodium). Pickles. Olives. °Grains °Baked goods made with fat, such as croissants, muffins, or some breads. Dry pasta or rice meal packs. °Meats and other proteins °Fatty cuts of meat. Ribs. Fried meat. Bacon. Bologna, salami, and other precooked or cured meats, such as sausages or meat loaves. Fat from the back of a pig (fatback). Bratwurst. Salted nuts and seeds. Canned beans with added salt. Canned or smoked fish.  Whole eggs or egg yolks. Chicken or turkey with skin. °Dairy °Whole or 2% milk, cream, and half-and-half. Whole or full-fat cream cheese. Whole-fat or sweetened yogurt. Full-fat cheese. Nondairy creamers. Whipped toppings. Processed cheese and cheese spreads. °Fats and oils °Butter. Stick margarine. Lard. Shortening. Ghee. Bacon fat. Tropical oils, such as coconut, palm kernel, or palm oil. °Seasonings and condiments °Onion salt, garlic salt, seasoned salt, table salt, and sea salt. Worcestershire sauce. Tartar sauce. Barbecue sauce. Teriyaki sauce. Soy sauce, including reduced-sodium. Steak sauce. Canned and packaged gravies. Fish sauce. Oyster sauce. Cocktail sauce. Store-bought horseradish. Ketchup. Mustard. Meat flavorings and tenderizers. Bouillon cubes. Hot sauces. Pre-made or packaged marinades. Pre-made or packaged taco seasonings. Relishes. Regular salad dressings. °Other foods °Salted popcorn and pretzels. °The items listed above may not be a complete list of foods and beverages you should avoid. Contact a dietitian for more information. °Where to find more information °National Heart, Lung, and Blood Institute: www.nhlbi.nih.gov °American Heart Association: www.heart.org °Academy of Nutrition and Dietetics: www.eatright.org °National Kidney Foundation: www.kidney.org °Summary °The DASH eating plan is a healthy eating plan that has been shown to reduce high blood pressure (hypertension). It may also reduce your risk for type 2 diabetes, heart disease, and stroke. °When on the DASH eating plan, aim to eat more fresh fruits and vegetables, whole grains, lean proteins, low-fat dairy, and heart-healthy fats. °With the DASH   eating plan, you should limit salt (sodium) intake to 2,300 mg a day. If you have hypertension, you may need to reduce your sodium intake to 1,500 mg a day. °Work with your health care provider or dietitian to adjust your eating plan to your individual calorie needs. °This information is not  intended to replace advice given to you by your health care provider. Make sure you discuss any questions you have with your health care provider. °Document Revised: 11/30/2018 Document Reviewed: 11/30/2018 °Elsevier Patient Education © 2022 Elsevier Inc. °Carbohydrate Counting for Diabetes Mellitus, Adult °Carbohydrate counting is a method of keeping track of how many carbohydrates you eat. Eating carbohydrates increases the amount of sugar (glucose) in the blood. Counting how many carbohydrates you eat improves how well you manage your blood glucose. This, in turn, helps you manage your diabetes. °Carbohydrates are measured in grams (g) per serving. It is important to know how many carbohydrates (in grams or by serving size) you can have in each meal. This is different for every person. A dietitian can help you make a meal plan and calculate how many carbohydrates you should have at each meal and snack. °What foods contain carbohydrates? °Carbohydrates are found in the following foods: °Grains, such as breads and cereals. °Dried beans and soy products. °Starchy vegetables, such as potatoes, peas, and corn. °Fruit and fruit juices. °Milk and yogurt. °Sweets and snack foods, such as cake, cookies, candy, chips, and soft drinks. °How do I count carbohydrates in foods? °There are two ways to count carbohydrates in food. You can read food labels or learn standard serving sizes of foods. You can use either of these methods or a combination of both. °Using the Nutrition Facts label °The Nutrition Facts list is included on the labels of almost all packaged foods and beverages in the [place]. It includes: °The serving size. °Information about nutrients in each serving, including the grams of carbohydrate per serving. °To use the Nutrition Facts, decide how many servings you will have. Then, multiply the number of servings by the number of carbohydrates per serving. The resulting number is the total grams of  carbohydrates that you will be having. °Learning the standard serving sizes of foods °When you eat carbohydrate foods that are not packaged or do not include Nutrition Facts on the label, you need to measure the servings in order to count the grams of carbohydrates. °Measure the foods that you will eat with a food scale or measuring cup, if needed. °Decide how many standard-size servings you will eat. °Multiply the number of servings by 15. For foods that contain carbohydrates, one serving equals 15 g of carbohydrates. °For example, if you eat 2 cups or 10 oz (300 g) of strawberries, you will have eaten 2 servings and 30 g of carbohydrates (2 servings x 15 g = 30 g). °For foods that have more than one food mixed, such as soups and casseroles, you must count the carbohydrates in each food that is included. °The following list contains standard serving sizes of common carbohydrate-rich foods. Each of these servings has about 15 g of carbohydrates: °1 slice of bread. °1 six-inch (15 cm) tortilla. °? cup or 2 oz (53 g) cooked rice or pasta. °½ cup or 3 oz (85 g) cooked or canned, drained and rinsed beans or lentils. °½ cup or 3 oz (85 g) starchy vegetable, such as peas, corn, or squash. °½ cup or 4 oz (120 g) hot cereal. °½ cup or 3 oz (85   g) boiled or mashed potatoes, or ¼ or 3 oz (85 g) of a large baked potato. °½ cup or 4 fl oz (118 mL) fruit juice. °1 cup or 8 fl oz (237 mL) milk. °1 small or 4 oz (106 g) apple. °½ or 2 oz (63 g) of a medium banana. °1 cup or 5 oz (150 g) strawberries. °3 cups or 1 oz (28.3 g) popped popcorn. °What is an example of carbohydrate counting? °To calculate the grams of carbohydrates in this sample meal, follow the steps shown below. °Sample meal °3 oz (85 g) chicken breast. °? cup or 4 oz (106 g) brown rice. °½ cup or 3 oz (85 g) corn. °1 cup or 8 fl oz (237 mL) milk. °1 cup or 5 oz (150 g) strawberries with sugar-free whipped topping. °Carbohydrate calculation °Identify the foods that  contain carbohydrates: °Rice. °Corn. °Milk. °Strawberries. °Calculate how many servings you have of each food: °2 servings rice. °1 serving corn. °1 serving milk. °1 serving strawberries. °Multiply each number of servings by 15 g: °2 servings rice x 15 g = 30 g. °1 serving corn x 15 g = 15 g. °1 serving milk x 15 g = 15 g. °1 serving strawberries x 15 g = 15 g. °Add together all of the amounts to find the total grams of carbohydrates eaten: °30 g + 15 g + 15 g + 15 g = 75 g of carbohydrates total. °What are tips for following this plan? °Shopping °Develop a meal plan and then make a shopping list. °Buy fresh and frozen vegetables, fresh and frozen fruit, dairy, eggs, beans, lentils, and whole grains. °Look at food labels. Choose foods that have more fiber and less sugar. °Avoid processed foods and foods with added sugars. °Meal planning °Aim to have the same number of grams of carbohydrates at each meal and for each snack time. °Plan to have regular, balanced meals and snacks. °Where to find more information °American Diabetes Association: diabetes.org °Centers for Disease Control and Prevention: cdc.gov °Academy of Nutrition and Dietetics: eatright.org °Association of Diabetes Care & Education Specialists: diabeteseducator.org °Summary °Carbohydrate counting is a method of keeping track of how many carbohydrates you eat. °Eating carbohydrates increases the amount of sugar (glucose) in your blood. °Counting how many carbohydrates you eat improves how well you manage your blood glucose. This helps you manage your diabetes. °A dietitian can help you make a meal plan and calculate how many carbohydrates you should have at each meal and snack. °This information is not intended to replace advice given to you by your health care provider. Make sure you discuss any questions you have with your health care provider. °Document Revised: 07/31/2019 Document Reviewed: 07/31/2019 °Elsevier Patient Education © 2022 Elsevier  Inc. ° °

## 2020-12-07 ENCOUNTER — Other Ambulatory Visit: Payer: Self-pay

## 2021-01-06 ENCOUNTER — Other Ambulatory Visit (HOSPITAL_COMMUNITY): Payer: Self-pay

## 2021-01-06 ENCOUNTER — Other Ambulatory Visit: Payer: Self-pay

## 2021-01-07 ENCOUNTER — Other Ambulatory Visit: Payer: Self-pay

## 2021-01-07 ENCOUNTER — Ambulatory Visit (INDEPENDENT_AMBULATORY_CARE_PROVIDER_SITE_OTHER): Payer: Self-pay | Admitting: Neurology

## 2021-01-07 ENCOUNTER — Encounter: Payer: Self-pay | Admitting: Neurology

## 2021-01-07 VITALS — BP 126/85 | HR 101 | Ht 69.0 in | Wt 170.0 lb

## 2021-01-07 DIAGNOSIS — G4730 Sleep apnea, unspecified: Secondary | ICD-10-CM | POA: Insufficient documentation

## 2021-01-07 DIAGNOSIS — J329 Chronic sinusitis, unspecified: Secondary | ICD-10-CM

## 2021-01-07 DIAGNOSIS — I639 Cerebral infarction, unspecified: Secondary | ICD-10-CM

## 2021-01-07 DIAGNOSIS — H538 Other visual disturbances: Secondary | ICD-10-CM

## 2021-01-07 MED ORDER — CLOPIDOGREL BISULFATE 75 MG PO TABS
75.0000 mg | ORAL_TABLET | Freq: Every day | ORAL | 4 refills | Status: DC
  Filled 2021-01-07: qty 90, 90d supply, fill #0
  Filled 2021-03-22 – 2021-04-04 (×2): qty 90, 90d supply, fill #1
  Filled 2021-07-06: qty 90, 90d supply, fill #0
  Filled 2021-07-06: qty 90, 90d supply, fill #2
  Filled 2021-09-19: qty 90, 90d supply, fill #1

## 2021-01-07 NOTE — Progress Notes (Signed)
Chief Complaint  Patient presents with   New Patient (Initial Visit)    Rm 13. Accompanied by wife. NP/Internal referral for CVA f/u. Pt c/o being cold in feet and hands after stroke. Pt has difficulty moving eyes.      ASSESSMENT AND PLAN  Henry Anderson is a 55 y.o. 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  Involving posterior circulation, medial right midbrai nand thalamus, also evidence of supratentorium small vessel disease, advanced for age,  Risk factors of diabetes, poorly controlled, heavy smoker, 3 packs a day, hypertension, hyperlipidemia,  Also reported a history of occasionally heart palpitation, posterior circulation stroke, tandem event, could not rule out the possibility of embolic event,  Refer to cardiologist for 30 days cardiac monitoring  Keep Plavix 75 mg daily  Laboratory evaluations, including repeat A1c, hypercoagulable panel  Narrow oropharyngeal space, reported apnea episode, high risk for obstructive sleep apnea refer to sleep study  Chronic sinusitis  Frequent nasal discharge, stuffy nose,  Refer to ENT    DIAGNOSTIC DATA (LABS, IMAGING, TESTING) - I reviewed patient records, labs, notes, testing and imaging myself where available.  1. 7 mm acute/early subacute infarct within the medial right midbrain and medial right thalamus. 2. Chronic small-vessel infarcts within the posterior limb of right internal capsule and right thalamus. 3. Background cerebral white matter chronic small-vessel ischemic changes, which are overall mild but advanced for age. Mild chronic small-vessel ischemic changes also present within the pons. 4. Paranasal sinus disease, as described. 5. Bilateral mastoid effusions.   IMPRESSION: Minimal atherosclerotic plaque bilaterally. No focal hemodynamically significant stenosis is noted.      MEDICAL HISTORY:  Henry Anderson is a 55 year old male, seen in request by his primary care nurse practitioner Caryl Asp  E, for evaluation of stroke, he is accompanied by his wife at today's visit January 07, 2021.  I reviewed and summarized the referring note. PMHX. 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, on October 10, 2020, after overnight sleep, getting ready to work, he felt dizziness, described as vertigo, fell to the ground twice, also noticed blurry vision, his wife decided to right with him, doing a driving, he was noted to veer to his left side, wife has to keep reminding him, he also noticed left arm weakness, left leg gave out underneath him, left facial numbness, presented to emergency room October 11, 2020, upon emergency room, elevated blood pressure 151/101,  On October 09, 2020, he also reported 1 episode of sudden onset left facial numbness, lasting for 30 minutes without weakness  I personally reviewed MRI of the brain with and without contrast October 13, 2020: Acute/subacute infarction involving the right midbrain and medial right thalamus, chronic small vessel infarction within the posterior limb of right internal capsule and right thalamus, small vessel disease, there was also evidence of left maxillary sinusitis, fluid level, with moderate mucosal thickening, bilateral sphenoid sinus mucosal thickening, MRI of brain showed intracranial atherosclerotic disease, moderate severe stenosis within the inferior division of proximal M2 right MCA, Echocardiogram showed ejection fraction 60 to 65%, Ultrasound of carotid arteries showed minimal atherosclerotic plaque bilaterally, no hemodynamic significant stenosis Laboratory evaluation: LDL 102, A1c 9.7, negative HIV, UDS, elevated WBC 12.5, hematocrit 17.1, BMP showed elevated glucose 270, creatinine 0.7,  He also complains of snoring, frequent awakening at nighttime, have sinus symptoms, stuffy nose, frequent discharge   PHYSICAL EXAM:  Vitals:   01/07/21 1001  BP: 126/85  Pulse: (!) 101   Weight: 170 lb (77.1 kg)  Height: _0  (1.753 m)   Not recorded     Body mass index is 25.1 kg/m.  PHYSICAL EXAMNIATION:  Gen: NAD, conversant, well nourised, well groomed                     Cardiovascular: Regular rate rhythm, no peripheral edema, warm, nontender. Eyes: Conjunctivae clear without exudates or hemorrhage Neck: Supple, no carotid bruits. Pulmonary: Clear to auscultation bilaterally   NEUROLOGICAL EXAM:  MENTAL STATUS: Speech:    Speech is normal; fluent and spontaneous with normal comprehension.  Cognition:     Orientation to time, place and person     Normal recent and remote memory     Normal Attention span and concentration     Normal Language, naming, repeating,spontaneous speech     Fund of knowledge   CRANIAL NERVES: CN II: Visual fields are full to confrontation. Pupils are round equal and briskly reactive to light. CN III, IV, VI: Mild disconjugate eye movement, covering uncover testing showed right superior exophoria, left exophoria, Red lens testing confirmed mild disconjugate right eye movement, no ptosis. CN V: Facial sensation is intact to light touch CN VII: Face is symmetric with normal eye closure  CN VIII: Hearing is normal to causal conversation. CN IX, X: Phonation is normal. CN XI: Head turning and shoulder shrug are intact CN XII: Tongue midline, narrow oropharyngeal space,  MOTOR: There is no pronator drift of out-stretched arms. Muscle bulk and tone are normal. Muscle strength is normal.  REFLEXES: Reflexes are 2+ and symmetric at the biceps, triceps, knees, and ankles. Plantar responses are flexor.  SENSORY: Intact to light touch, pinprick and vibratory sensation are intact in fingers and toes.  COORDINATION: There is no trunk or limb dysmetria noted.  GAIT/STANCE: Need push-up to get up from seated position, mildly unsteady  REVIEW OF SYSTEMS:  Full 14 system review of systems performed and notable only for as  above All other review of systems were negative.   ALLERGIES: No Known Allergies  HOME MEDICATIONS: Current Outpatient Medications  Medication Sig Dispense Refill   acetaminophen (TYLENOL) 325 MG tablet Take 2 tablets (650 mg total) by mouth every 4 (four) hours as needed for mild pain (or temp > 37.5 C (99.5 F)).     albuterol (VENTOLIN HFA) 108 (90 Base) MCG/ACT inhaler Inhale 2 puffs into the lungs every 6 (six) hours as needed for wheezing or shortness of breath. 8.5 g 2   atorvastatin (LIPITOR) 20 MG tablet Take 1 tablet (20 mg total) by mouth once daily. 30 tablet 2   blood glucose meter kit and supplies KIT Use up to four times daily as directed to check blood sugar. 1 each 0   chlorthalidone (HYGROTON) 25 MG tablet Take 1 tablet (25 mg total) by mouth once daily. 30 tablet 2   clopidogrel (PLAVIX) 75 MG tablet Take 1 tablet (75 mg total) by mouth once daily. 30 tablet 2   fluticasone (FLONASE) 50 MCG/ACT nasal spray Place 2 sprays into both nostrils once daily. 16 g 0   glucose blood (RIGHTEST GS550 BLOOD GLUCOSE) test strip Use up to four times daily as directed to check blood sugar. 100 each 11   Insulin Glargine (BASAGLAR KWIKPEN) 100 UNIT/ML Inject 10 Units into the skin once daily. 15 mL 2   Insulin Pen Needle 32G X 4 MM MISC  Use with Basaglar insulin pen. 100 each 2   lisinopril (ZESTRIL) 20 MG tablet Take 1 tablet (20 mg total) by mouth once daily. 30 tablet 2   metFORMIN (GLUCOPHAGE) 1000 MG tablet Take 1 tablet (1,000 mg total) by mouth 2 (two) times daily with a meal. 60 tablet 2   mometasone-formoterol (DULERA) 100-5 MCG/ACT AERO Inhale 2 puffs into the lungs 2 (two) times daily. 1 each 2   nicotine (NICODERM CQ - DOSED IN MG/24 HOURS) 21 mg/24hr patch Place 1 patch (21 mg total) onto the skin daily. 28 patch 0   Rightest GL300 Lancets MISC Use up to four times daily as directed to check blood sugar. 100 each 11   No current facility-administered medications for this  visit.    PAST MEDICAL HISTORY: Past Medical History:  Diagnosis Date   Diabetes mellitus without complication ([place])    Emphysema of lung ([place])    Hypertension    Stroke ([place])     PAST SURGICAL HISTORY: Past Surgical History:  Procedure Laterality Date   FOOT SURGERY Right    KNEE SURGERY Right    SHOULDER SURGERY Left     FAMILY HISTORY: Family History  Problem Relation Age of Onset   COPD Mother    Diabetes Father    Diabetes Sister    Diabetes Sister    COPD Maternal Grandmother     SOCIAL HISTORY: Social History   Socioeconomic History   Marital status: Married    Spouse name: Not on file   Number of children: Not on file   Years of education: Not on file   Highest education level: Not on file  Occupational History   Not on file  Tobacco Use   Smoking status: Every Day    Packs/day: 1.50    Years: 30.00    Pack years: 45.00    Types: Cigarettes   Smokeless tobacco: Former    Types: Chew   Tobacco comments:    Patient has cut down to ~1 ppd since having stroke. Gave [place] Quit info.  Vaping Use   Vaping Use: Never used  Substance and Sexual Activity   Alcohol use: Yes    Comment: occasionally 1-2 beers   Drug use: Never   Sexual activity: Not on file  Other Topics Concern   Not on file  Social History Narrative   Not on file   Social Determinants of Health   Financial Resource Strain: Not on file  Food Insecurity: No Food Insecurity   Worried About Running Out of Food in the Last Year: Never true   Ran Out of Food in the Last Year: Never true  Transportation Needs: No Transportation Needs   Lack of Transportation (Medical): No   Lack of Transportation (Non-Medical): No  Physical Activity: Not on file  Stress: Not on file  Social Connections: Not on file  Intimate Partner Violence: Not on file   Total time spent reviewing the chart, obtaining history, examined patient, ordering tests, documentation, consultations and family, care  coordination was 59 minues     Marcial Pacas, M.D. Ph.D.  [facility] [address] [place], [place] [address] Ph: [phone] Fax: [phone]  CC:  Langston Reusing, NP [address],  [place] [address]  Pcp, No

## 2021-01-16 LAB — STATUS REPORT

## 2021-01-19 ENCOUNTER — Encounter: Payer: Self-pay | Admitting: Gerontology

## 2021-01-19 ENCOUNTER — Other Ambulatory Visit: Payer: Self-pay

## 2021-01-19 ENCOUNTER — Ambulatory Visit: Payer: Medicaid Other | Admitting: Gerontology

## 2021-01-19 VITALS — BP 120/86 | HR 100 | Temp 97.8°F | Resp 18 | Ht 69.0 in | Wt 175.3 lb

## 2021-01-19 DIAGNOSIS — G4730 Sleep apnea, unspecified: Secondary | ICD-10-CM

## 2021-01-19 DIAGNOSIS — E119 Type 2 diabetes mellitus without complications: Secondary | ICD-10-CM

## 2021-01-19 DIAGNOSIS — Z720 Tobacco use: Secondary | ICD-10-CM

## 2021-01-19 DIAGNOSIS — I639 Cerebral infarction, unspecified: Secondary | ICD-10-CM

## 2021-01-19 MED ORDER — ATORVASTATIN CALCIUM 40 MG PO TABS
40.0000 mg | ORAL_TABLET | Freq: Every day | ORAL | 2 refills | Status: DC
  Filled 2021-01-19: qty 30, 30d supply, fill #0
  Filled 2021-03-03: qty 30, 30d supply, fill #1
  Filled 2021-04-04: qty 30, 30d supply, fill #2

## 2021-01-19 MED ORDER — BASAGLAR KWIKPEN 100 UNIT/ML ~~LOC~~ SOPN
16.0000 [IU] | PEN_INJECTOR | Freq: Every day | SUBCUTANEOUS | 2 refills | Status: DC
  Filled 2021-01-19 – 2021-02-14 (×2): qty 15, 93d supply, fill #0

## 2021-01-19 MED ORDER — METFORMIN HCL 1000 MG PO TABS
1000.0000 mg | ORAL_TABLET | Freq: Two times a day (BID) | ORAL | 2 refills | Status: DC
  Filled 2021-01-19: qty 60, 30d supply, fill #0
  Filled 2021-02-06: qty 180, 90d supply, fill #0

## 2021-01-19 NOTE — Patient Instructions (Signed)
Carbohydrate Counting for Diabetes Mellitus, Adult °Carbohydrate counting is a method of keeping track of how many carbohydrates you eat. Eating carbohydrates increases the amount of sugar (glucose) in the blood. Counting how many carbohydrates you eat improves how well you manage your blood glucose. This, in turn, helps you manage your diabetes. °Carbohydrates are measured in grams (g) per serving. It is important to know how many carbohydrates (in grams or by serving size) you can have in each meal. This is different for every person. A dietitian can help you make a meal plan and calculate how many carbohydrates you should have at each meal and snack. °What foods contain carbohydrates? °Carbohydrates are found in the following foods: °Grains, such as breads and cereals. °Dried beans and soy products. °Starchy vegetables, such as potatoes, peas, and corn. °Fruit and fruit juices. °Milk and yogurt. °Sweets and snack foods, such as cake, cookies, candy, chips, and soft drinks. °How do I count carbohydrates in foods? °There are two ways to count carbohydrates in food. You can read food labels or learn standard serving sizes of foods. You can use either of these methods or a combination of both. °Using the Nutrition Facts label °The Nutrition Facts list is included on the labels of almost all packaged foods and beverages in the [place]. It includes: °The serving size. °Information about nutrients in each serving, including the grams of carbohydrate per serving. °To use the Nutrition Facts, decide how many servings you will have. Then, multiply the number of servings by the number of carbohydrates per serving. The resulting number is the total grams of carbohydrates that you will be having. °Learning the standard serving sizes of foods °When you eat carbohydrate foods that are not packaged or do not include Nutrition Facts on the label, you need to measure the servings in order to count the grams of  carbohydrates. °Measure the foods that you will eat with a food scale or measuring cup, if needed. °Decide how many standard-size servings you will eat. °Multiply the number of servings by 15. For foods that contain carbohydrates, one serving equals 15 g of carbohydrates. °For example, if you eat 2 cups or 10 oz (300 g) of strawberries, you will have eaten 2 servings and 30 g of carbohydrates (2 servings x 15 g = 30 g). °For foods that have more than one food mixed, such as soups and casseroles, you must count the carbohydrates in each food that is included. °The following list contains standard serving sizes of common carbohydrate-rich foods. Each of these servings has about 15 g of carbohydrates: °1 slice of bread. °1 six-inch (15 cm) tortilla. °? cup or 2 oz (53 g) cooked rice or pasta. °½ cup or 3 oz (85 g) cooked or canned, drained and rinsed beans or lentils. °½ cup or 3 oz (85 g) starchy vegetable, such as peas, corn, or squash. °½ cup or 4 oz (120 g) hot cereal. °½ cup or 3 oz (85 g) boiled or mashed potatoes, or ¼ or 3 oz (85 g) of a large baked potato. °½ cup or 4 fl oz (118 mL) fruit juice. °1 cup or 8 fl oz (237 mL) milk. °1 small or 4 oz (106 g) apple. °½ or 2 oz (63 g) of a medium banana. °1 cup or 5 oz (150 g) strawberries. °3 cups or 1 oz (28.3 g) popped popcorn. °What is an example of carbohydrate counting? °To calculate the grams of carbohydrates in this sample meal, follow the steps   shown below. °Sample meal °3 oz (85 g) chicken breast. °? cup or 4 oz (106 g) brown rice. °½ cup or 3 oz (85 g) corn. °1 cup or 8 fl oz (237 mL) milk. °1 cup or 5 oz (150 g) strawberries with sugar-free whipped topping. °Carbohydrate calculation °Identify the foods that contain carbohydrates: °Rice. °Corn. °Milk. °Strawberries. °Calculate how many servings you have of each food: °2 servings rice. °1 serving corn. °1 serving milk. °1 serving strawberries. °Multiply each number of servings by 15 g: °2 servings rice x 15  g = 30 g. °1 serving corn x 15 g = 15 g. °1 serving milk x 15 g = 15 g. °1 serving strawberries x 15 g = 15 g. °Add together all of the amounts to find the total grams of carbohydrates eaten: °30 g + 15 g + 15 g + 15 g = 75 g of carbohydrates total. °What are tips for following this plan? °Shopping °Develop a meal plan and then make a shopping list. °Buy fresh and frozen vegetables, fresh and frozen fruit, dairy, eggs, beans, lentils, and whole grains. °Look at food labels. Choose foods that have more fiber and less sugar. °Avoid processed foods and foods with added sugars. °Meal planning °Aim to have the same number of grams of carbohydrates at each meal and for each snack time. °Plan to have regular, balanced meals and snacks. °Where to find more information °American Diabetes Association: diabetes.org °Centers for Disease Control and Prevention: cdc.gov °Academy of Nutrition and Dietetics: eatright.org °Association of Diabetes Care & Education Specialists: diabeteseducator.org °Summary °Carbohydrate counting is a method of keeping track of how many carbohydrates you eat. °Eating carbohydrates increases the amount of sugar (glucose) in your blood. °Counting how many carbohydrates you eat improves how well you manage your blood glucose. This helps you manage your diabetes. °A dietitian can help you make a meal plan and calculate how many carbohydrates you should have at each meal and snack. °This information is not intended to replace advice given to you by your health care provider. Make sure you discuss any questions you have with your health care provider. °Document Revised: 07/31/2019 Document Reviewed: 07/31/2019 °Elsevier Patient Education © 2022 Elsevier Inc. °DASH Eating Plan °DASH stands for Dietary Approaches to Stop Hypertension. The DASH eating plan is a healthy eating plan that has been shown to: °Reduce high blood pressure (hypertension). °Reduce your risk for type 2 diabetes, heart disease, and  stroke. °Help with weight loss. °What are tips for following this plan? °Reading food labels °Check food labels for the amount of salt (sodium) per serving. Choose foods with less than 5 percent of the Daily Value of sodium. Generally, foods with less than 300 milligrams (mg) of sodium per serving fit into this eating plan. °To find whole grains, look for the word "whole" as the first word in the ingredient list. °Shopping °Buy products labeled as "low-sodium" or "no salt added." °Buy fresh foods. Avoid canned foods and pre-made or frozen meals. °Cooking °Avoid adding salt when cooking. Use salt-free seasonings or herbs instead of table salt or sea salt. Check with your health care provider or pharmacist before using salt substitutes. °Do not fry foods. Cook foods using healthy methods such as baking, boiling, grilling, roasting, and broiling instead. °Cook with heart-healthy oils, such as olive, canola, avocado, soybean, or sunflower oil. °Meal planning ° °Eat a balanced diet that includes: °4 or more servings of fruits and 4 or more servings of vegetables each day.   Try to fill one-half of your plate with fruits and vegetables. °6-8 servings of whole grains each day. °Less than 6 oz (170 g) of lean meat, poultry, or fish each day. A 3-oz (85-g) serving of meat is about the same size as a deck of cards. One egg equals 1 oz (28 g). °2-3 servings of low-fat dairy each day. One serving is 1 cup (237 mL). °1 serving of nuts, seeds, or beans 5 times each week. °2-3 servings of heart-healthy fats. Healthy fats called omega-3 fatty acids are found in foods such as walnuts, flaxseeds, fortified milks, and eggs. These fats are also found in cold-water fish, such as sardines, salmon, and mackerel. °Limit how much you eat of: °Canned or prepackaged foods. °Food that is high in trans fat, such as some fried foods. °Food that is high in saturated fat, such as fatty meat. °Desserts and other sweets, sugary drinks, and other foods  with added sugar. °Full-fat dairy products. °Do not salt foods before eating. °Do not eat more than 4 egg yolks a week. °Try to eat at least 2 vegetarian meals a week. °Eat more home-cooked food and less restaurant, buffet, and fast food. °Lifestyle °When eating at a restaurant, ask that your food be prepared with less salt or no salt, if possible. °If you drink alcohol: °Limit how much you use to: °0-1 drink a day for women who are not pregnant. °0-2 drinks a day for men. °Be aware of how much alcohol is in your drink. In the [place] one drink equals one 12 oz bottle of beer (355 mL), one 5 oz glass of wine (148 mL), or one 1½ oz glass of hard liquor (44 mL). °General information °Avoid eating more than 2,300 mg of salt a day. If you have hypertension, you may need to reduce your sodium intake to 1,500 mg a day. °Work with your health care provider to maintain a healthy body weight or to lose weight. Ask what an ideal weight is for you. °Get at least 30 minutes of exercise that causes your heart to beat faster (aerobic exercise) most days of the week. Activities may include walking, swimming, or biking. °Work with your health care provider or dietitian to adjust your eating plan to your individual calorie needs. °What foods should I eat? °Fruits °All fresh, dried, or frozen fruit. Canned fruit in natural juice (without added sugar). °Vegetables °Fresh or frozen vegetables (raw, steamed, roasted, or grilled). Low-sodium or reduced-sodium tomato and vegetable juice. Low-sodium or reduced-sodium tomato sauce and tomato paste. Low-sodium or reduced-sodium canned vegetables. °Grains °Whole-grain or whole-wheat bread. Whole-grain or whole-wheat pasta. Brown rice. Oatmeal. Quinoa. Bulgur. Whole-grain and low-sodium cereals. Pita bread. Low-fat, low-sodium crackers. Whole-wheat flour tortillas. °Meats and other proteins °Skinless chicken or turkey. Ground chicken or turkey. Pork with fat trimmed off. Fish and seafood. Egg  whites. Dried beans, peas, or lentils. Unsalted nuts, nut butters, and seeds. Unsalted canned beans. Lean cuts of beef with fat trimmed off. Low-sodium, lean precooked or cured meat, such as sausages or meat loaves. °Dairy °Low-fat (1%) or fat-free (skim) milk. Reduced-fat, low-fat, or fat-free cheeses. Nonfat, low-sodium ricotta or cottage cheese. Low-fat or nonfat yogurt. Low-fat, low-sodium cheese. °Fats and oils °Soft margarine without trans fats. Vegetable oil. Reduced-fat, low-fat, or light mayonnaise and salad dressings (reduced-sodium). Canola, safflower, olive, avocado, soybean, and sunflower oils. Avocado. °Seasonings and condiments °Herbs. Spices. Seasoning mixes without salt. °Other foods °Unsalted popcorn and pretzels. Fat-free sweets. °The items listed above may not be   a complete list of foods and beverages you can eat. Contact a dietitian for more information. °What foods should I avoid? °Fruits °Canned fruit in a light or heavy syrup. Fried fruit. Fruit in cream or butter sauce. °Vegetables °Creamed or fried vegetables. Vegetables in a cheese sauce. Regular canned vegetables (not low-sodium or reduced-sodium). Regular canned tomato sauce and paste (not low-sodium or reduced-sodium). Regular tomato and vegetable juice (not low-sodium or reduced-sodium). Pickles. Olives. °Grains °Baked goods made with fat, such as croissants, muffins, or some breads. Dry pasta or rice meal packs. °Meats and other proteins °Fatty cuts of meat. Ribs. Fried meat. Bacon. Bologna, salami, and other precooked or cured meats, such as sausages or meat loaves. Fat from the back of a pig (fatback). Bratwurst. Salted nuts and seeds. Canned beans with added salt. Canned or smoked fish. Whole eggs or egg yolks. Chicken or turkey with skin. °Dairy °Whole or 2% milk, cream, and half-and-half. Whole or full-fat cream cheese. Whole-fat or sweetened yogurt. Full-fat cheese. Nondairy creamers. Whipped toppings. Processed cheese and  cheese spreads. °Fats and oils °Butter. Stick margarine. Lard. Shortening. Ghee. Bacon fat. Tropical oils, such as coconut, palm kernel, or palm oil. °Seasonings and condiments °Onion salt, garlic salt, seasoned salt, table salt, and sea salt. Worcestershire sauce. Tartar sauce. Barbecue sauce. Teriyaki sauce. Soy sauce, including reduced-sodium. Steak sauce. Canned and packaged gravies. Fish sauce. Oyster sauce. Cocktail sauce. Store-bought horseradish. Ketchup. Mustard. Meat flavorings and tenderizers. Bouillon cubes. Hot sauces. Pre-made or packaged marinades. Pre-made or packaged taco seasonings. Relishes. Regular salad dressings. °Other foods °Salted popcorn and pretzels. °The items listed above may not be a complete list of foods and beverages you should avoid. Contact a dietitian for more information. °Where to find more information °National Heart, Lung, and Blood Institute: www.nhlbi.nih.gov °American Heart Association: www.heart.org °Academy of Nutrition and Dietetics: www.eatright.org °National Kidney Foundation: www.kidney.org °Summary °The DASH eating plan is a healthy eating plan that has been shown to reduce high blood pressure (hypertension). It may also reduce your risk for type 2 diabetes, heart disease, and stroke. °When on the DASH eating plan, aim to eat more fresh fruits and vegetables, whole grains, lean proteins, low-fat dairy, and heart-healthy fats. °With the DASH eating plan, you should limit salt (sodium) intake to 2,300 mg a day. If you have hypertension, you may need to reduce your sodium intake to 1,500 mg a day. °Work with your health care provider or dietitian to adjust your eating plan to your individual calorie needs. °This information is not intended to replace advice given to you by your health care provider. Make sure you discuss any questions you have with your health care provider. °Document Revised: 11/30/2018 Document Reviewed: 11/30/2018 °Elsevier Patient Education © 2022  Elsevier Inc. ° °

## 2021-01-19 NOTE — Progress Notes (Signed)
Established Patient Office Visit  Subjective:  Patient ID: Henry Anderson, male    DOB: 08-01-1965  Age: 56 y.o. MRN: [id number]  CC:  Chief Complaint  Patient presents with   Follow-up    Labs drawn 01/07/21 with his neurologist, Dr. Krista Blue   Diabetes    HPI Henry Anderson is a 56 y/o male who has history of Type 2 diabetes mellitus, Emphysema, Hypertension, Stroke, presents for follow up visit and lab review.His HgbA1c done on 01/07/21 decreased from 9.7% to 9.3%, he checks his blood glucose blood glucose bid, he states that his fasting readings are less than 130 mg/dl. He denies hypo/hyperglycemic symptoms, peripheral neuropathy and performs daily foot checks. He has cut back on smoking 10 cigarettes daily and continues to work on quitting. His LDL was 108 mg/dl, HDL was 36 mg/dl, Triglycerides decreased from 446 mg/dl to 192 mg/dl. He was seen by Neurologist Dr Marcial Pacas on 01/07/21 for CVA follow up. Per Dr Aliene Beams plan, he will continue 75 mg Plavix daily, Cardiology referral for 30 day Cardiac monitor, and Apnea evaluation. He states that he continues to snore and experiences frequent awakening at night time. He will reschedule his Ophthalmology appointment at [facility]. Overall, he states that he's doing well and offers no further complaint.    Past Medical History:  Diagnosis Date   Diabetes mellitus without complication ([place])    Emphysema of lung ([place])    Hypertension    Stroke [facility])     Past Surgical History:  Procedure Laterality Date   FOOT SURGERY Right    KNEE SURGERY Right    SHOULDER SURGERY Left     Family History  Problem Relation Age of Onset   COPD Mother    Diabetes Father    Diabetes Sister    Diabetes Sister    COPD Maternal Grandmother     Social History   Socioeconomic History   Marital status: Married    Spouse name: Not on file   Number of children: Not on file   Years of education: Not on file   Highest education level: Not on file  Occupational  History   Not on file  Tobacco Use   Smoking status: Every Day    Packs/day: 1.50    Years: 30.00    Pack years: 45.00    Types: Cigarettes   Smokeless tobacco: Former    Types: Chew   Tobacco comments:    Patient has cut down to ~1 ppd since having stroke. Gave [place] Quit info. Patient down to 1/2 to 3/4 ppd  Vaping Use   Vaping Use: Never used  Substance and Sexual Activity   Alcohol use: Yes    Comment: occasionally 1-2 beers   Drug use: Never   Sexual activity: Not on file  Other Topics Concern   Not on file  Social History Narrative   Not on file   Social Determinants of Health   Financial Resource Strain: Not on file  Food Insecurity: No Food Insecurity   Worried About Running Out of Food in the Last Year: Never true   Ran Out of Food in the Last Year: Never true  Transportation Needs: No Transportation Needs   Lack of Transportation (Medical): No   Lack of Transportation (Non-Medical): No  Physical Activity: Not on file  Stress: Not on file  Social Connections: Not on file  Intimate Partner Violence: Not on file    Outpatient Medications Prior to Visit  Medication Sig  Dispense Refill   albuterol (VENTOLIN HFA) 108 (90 Base) MCG/ACT inhaler Inhale 2 puffs into the lungs every 6 (six) hours as needed for wheezing or shortness of breath. 8.5 g 2   blood glucose meter kit and supplies KIT Use up to four times daily as directed to check blood sugar. 1 each 0   chlorthalidone (HYGROTON) 25 MG tablet Take 1 tablet (25 mg total) by mouth once daily. 30 tablet 2   clopidogrel (PLAVIX) 75 MG tablet Take 1 tablet (75 mg total) by mouth once daily. 90 tablet 4   fluticasone (FLONASE) 50 MCG/ACT nasal spray Place 2 sprays into both nostrils once daily. 16 g 0   glucose blood (RIGHTEST GS550 BLOOD GLUCOSE) test strip Use up to four times daily as directed to check blood sugar. 100 each 11   Insulin Pen Needle 32G X 4 MM MISC Use with Basaglar insulin pen. 100 each 2   lisinopril  (ZESTRIL) 20 MG tablet Take 1 tablet (20 mg total) by mouth once daily. 30 tablet 2   mometasone-formoterol (DULERA) 100-5 MCG/ACT AERO Inhale 2 puffs into the lungs 2 (two) times daily. 1 each 2   naproxen sodium (ALEVE) 220 MG tablet Take 220 mg by mouth daily as needed.     Rightest GL300 Lancets MISC Use up to four times daily as directed to check blood sugar. 100 each 11   atorvastatin (LIPITOR) 20 MG tablet Take 1 tablet (20 mg total) by mouth once daily. 30 tablet 2   Insulin Glargine (BASAGLAR KWIKPEN) 100 UNIT/ML Inject 10 Units into the skin once daily. 15 mL 2   metFORMIN (GLUCOPHAGE) 1000 MG tablet Take 1 tablet (1,000 mg total) by mouth 2 (two) times daily with a meal. 60 tablet 2   acetaminophen (TYLENOL) 325 MG tablet Take 2 tablets (650 mg total) by mouth every 4 (four) hours as needed for mild pain (or temp > 37.5 C (99.5 F)). (Patient not taking: Reported on 01/19/2021)     nicotine (NICODERM CQ - DOSED IN MG/24 HOURS) 21 mg/24hr patch Place 1 patch (21 mg total) onto the skin daily. (Patient not taking: Reported on 01/19/2021) 28 patch 0   No facility-administered medications prior to visit.    No Known Allergies  ROS Review of Systems  Constitutional: Negative.   Eyes: Negative.   Respiratory: Negative.    Cardiovascular: Negative.   Endocrine: Negative.   Skin: Negative.   Neurological: Negative.   Hematological: Negative.   Psychiatric/Behavioral: Negative.       Objective:    Physical Exam HENT:     Head: Normocephalic and atraumatic.     Mouth/Throat:     Mouth: Mucous membranes are moist.  Eyes:     Extraocular Movements: Extraocular movements intact.     Conjunctiva/sclera: Conjunctivae normal.     Pupils: Pupils are equal, round, and reactive to light.  Cardiovascular:     Rate and Rhythm: Normal rate and regular rhythm.     Pulses: Normal pulses.     Heart sounds: Normal heart sounds.  Pulmonary:     Effort: Pulmonary effort is normal.     Breath  sounds: Normal breath sounds.  Skin:    General: Skin is warm.  Neurological:     General: No focal deficit present.     Mental Status: He is alert and oriented to person, place, and time. Mental status is at baseline.  Psychiatric:        Mood and Affect: Mood  normal.        Behavior: Behavior normal.        Thought Content: Thought content normal.        Judgment: Judgment normal.    BP 120/86 (BP Location: Right Arm, Patient Position: Sitting, Cuff Size: Large)    Pulse 100    Temp 97.8 F (36.6 C) (Oral)    Resp 18    Ht _0  (1.753 m)    Wt 175 lb 4.8 oz (79.5 kg)    SpO2 98%    BMI 25.89 kg/m  Wt Readings from Last 3 Encounters:  01/19/21 175 lb 4.8 oz (79.5 kg)  01/07/21 170 lb (77.1 kg)  12/01/20 171 lb 9.6 oz (77.8 kg)     Health Maintenance Due  Topic Date Due   COVID-19 Vaccine (1) Never done   Pneumococcal Vaccine 57-36 Years old (1 - PCV) Never done   FOOT EXAM  Never done   OPHTHALMOLOGY EXAM  Never done   Hepatitis C Screening  Never done   Zoster Vaccines- Shingrix (1 of 2) Never done   COLONOSCOPY (Pts 45-44yr Insurance coverage will need to be confirmed)  Never done   INFLUENZA VACCINE  Never done    There are no preventive care reminders to display for this patient.  Lab Results  Component Value Date   TSH 2.400 01/07/2021   Lab Results  Component Value Date   WBC 12.5 (H) 10/13/2020   HGB 17.1 (H) 10/13/2020   HCT 47.7 10/13/2020   MCV 85.6 10/13/2020   PLT 279 10/13/2020   Lab Results  Component Value Date   NA 133 (L) 10/13/2020   K 3.9 10/13/2020   CO2 28 10/13/2020   GLUCOSE 270 (H) 10/13/2020   BUN 11 10/13/2020   CREATININE 0.74 10/13/2020   BILITOT 0.7 11/26/2015   ALKPHOS 80 11/26/2015   AST 25 11/26/2015   ALT 17 11/26/2015   PROT 6.9 11/26/2015   ALBUMIN 3.8 11/26/2015   CALCIUM 9.8 10/13/2020   ANIONGAP 9 10/13/2020   Lab Results  Component Value Date   CHOL 178 01/07/2021   Lab Results  Component Value Date    HDL 36 (L) 01/07/2021   Lab Results  Component Value Date   LDLCALC 108 (H) 01/07/2021   Lab Results  Component Value Date   TRIG 192 (H) 01/07/2021   Lab Results  Component Value Date   CHOLHDL 4.9 01/07/2021   Lab Results  Component Value Date   HGBA1C 9.3 (H) 01/07/2021      Assessment & Plan:    1. Cerebrovascular accident (CVA), unspecified mechanism ([place] - He will continue on current medication, his Atorvastatin was increased to 40 mg daily. He will follow up with Cardiologist. - Ambulatory referral to Cardiology - atorvastatin (LIPITOR) 40 MG tablet; Take 1 tablet (40 mg total) by mouth daily.  Dispense: 30 tablet; Refill: 2  2. Type 2 diabetes mellitus without complication, without long-term current use of insulin ([facility]) - His HgbA1c was 9.3%, his Glargine was increased to 16 units at qhs, continue checking his blood glucose bid, record and bring log to follow up appointment. He was encouraged to continue on low carb/non concentrated sweet diet. - Insulin Glargine (BASAGLAR KWIKPEN) 100 UNIT/ML; Inject 16 Units into the skin daily.  Dispense: 15 mL; Refill: 2 - metFORMIN (GLUCOPHAGE) 1000 MG tablet; Take 1 tablet (1,000 mg total) by mouth 2 (two) times daily with a meal.  Dispense: 60 tablet; Refill: 2  3. Sleep apnea, unspecified type - He will [facility] with [facility]  Ambulatory referral to Neurology for sleep study.  4. Tobacco abuse - He was encouraged to continue on smoking cessation, provided [place] [facility] Quitline information.     Follow-up: Return in about 3 months (around 04/07/2021), or if symptoms worsen or fail to improve.    Charbel Los Jerold Coombe, NP

## 2021-01-20 LAB — HYPERCOAGULABLE PANEL, COMPREHENSIVE

## 2021-01-20 LAB — HEMOGLOBIN A1C
Est. average glucose Bld gHb Est-mCnc: 220 mg/dL
Hgb A1c MFr Bld: 9.3 % — ABNORMAL HIGH (ref 4.8–5.6)

## 2021-01-20 LAB — LIPID PANEL
Chol/HDL Ratio: 4.9 ratio (ref 0.0–5.0)
Cholesterol, Total: 178 mg/dL (ref 100–199)
HDL: 36 mg/dL — ABNORMAL LOW (ref >39)
LDL Chol Calc (NIH): 108 mg/dL — ABNORMAL HIGH (ref 0–99)
Triglycerides: 192 mg/dL — ABNORMAL HIGH (ref 0–149)
VLDL Cholesterol Cal: 34 mg/dL (ref 5–40)

## 2021-01-20 LAB — SEDIMENTATION RATE: Sed Rate: 35 mm/hr — ABNORMAL HIGH (ref 0–30)

## 2021-01-20 LAB — RPR: RPR Ser Ql: NONREACTIVE

## 2021-01-20 LAB — TSH: TSH: 2.4 u[IU]/mL (ref 0.450–4.500)

## 2021-01-20 LAB — HIV ANTIBODY (ROUTINE TESTING W REFLEX): HIV Screen 4th Generation wRfx: NONREACTIVE

## 2021-01-20 LAB — VITAMIN B12: Vitamin B-12: 515 pg/mL (ref 232–1245)

## 2021-01-20 LAB — C-REACTIVE PROTEIN: CRP: 6 mg/L (ref 0–10)

## 2021-01-20 LAB — ANA W/REFLEX IF POSITIVE: Anti Nuclear Antibody (ANA): NEGATIVE

## 2021-02-08 ENCOUNTER — Other Ambulatory Visit: Payer: Self-pay

## 2021-02-09 ENCOUNTER — Other Ambulatory Visit: Payer: Self-pay

## 2021-02-15 ENCOUNTER — Other Ambulatory Visit: Payer: Self-pay

## 2021-02-16 ENCOUNTER — Ambulatory Visit (INDEPENDENT_AMBULATORY_CARE_PROVIDER_SITE_OTHER): Payer: Self-pay

## 2021-02-16 ENCOUNTER — Ambulatory Visit (INDEPENDENT_AMBULATORY_CARE_PROVIDER_SITE_OTHER): Payer: Self-pay | Admitting: Cardiology

## 2021-02-16 ENCOUNTER — Other Ambulatory Visit: Payer: Self-pay

## 2021-02-16 ENCOUNTER — Encounter: Payer: Self-pay | Admitting: Cardiology

## 2021-02-16 DIAGNOSIS — I639 Cerebral infarction, unspecified: Secondary | ICD-10-CM

## 2021-02-16 DIAGNOSIS — F17200 Nicotine dependence, unspecified, uncomplicated: Secondary | ICD-10-CM

## 2021-02-16 DIAGNOSIS — I10 Essential (primary) hypertension: Secondary | ICD-10-CM

## 2021-02-16 NOTE — Progress Notes (Signed)
Cardiology Office Note:    Date:  02/16/2021   ID:  Henry Anderson, DOB 12-Jul-1965, MRN [id number]  PCP:  Pcp, No   [facility] Providers Cardiologist:  None     Referring MD: Henry Pacas, MD   Chief Complaint  Patient presents with   New Patient (Initial Visit)    Hospital follow up -- Meds reviewed verbally with patient.     History of Present Illness:    Henry Anderson is a 56 y.o. male with a hx of hypertension, diabetes, current smoker x40+ years, COPD, CVA presenting to establish care due to history of stroke.  Patient admitted to the hospital October 2020 to due to blurry vision, MRI revealed an infarct in the right midbrain and thalamus.  Carotid ultrasound showed minimal atherosclerotic plaque bilaterally.  Evaluated by neurology, started on aspirin and Plavix, Lipitor.  Echo 10/2020 showed EF 60 to 73%, normal diastolic function, no significant valvular abnormalities.  Patient followed up with neurology, neurology recommends patient continue his Plavix and Lipitor.  30-day cardiac monitor requested to evaluate presence of arrhythmias.  He still has vision problems, also has diabetes.  Has an appointment with eye specialist at [facility] in the coming weeks.  He still smokes, he is working on quitting.  Past Medical History:  Diagnosis Date   Diabetes mellitus without complication ([place])    Emphysema of lung ([place])    Hypertension    Stroke [facility])     Past Surgical History:  Procedure Laterality Date   FOOT SURGERY Right    KNEE SURGERY Right    SHOULDER SURGERY Left     Current Medications: Current Meds  Medication Sig   acetaminophen (TYLENOL) 325 MG tablet Take 2 tablets (650 mg total) by mouth every 4 (four) hours as needed for mild pain (or temp > 37.5 C (99.5 F)).   albuterol (VENTOLIN HFA) 108 (90 Base) MCG/ACT inhaler Inhale 2 puffs into the lungs every 6 (six) hours as needed for wheezing or shortness of breath.   atorvastatin (LIPITOR) 40 MG tablet Take 1  tablet (40 mg total) by mouth once daily.   blood glucose meter kit and supplies KIT Use up to four times daily as directed to check blood sugar.   chlorthalidone (HYGROTON) 25 MG tablet Take 1 tablet (25 mg total) by mouth once daily.   clopidogrel (PLAVIX) 75 MG tablet Take 1 tablet (75 mg total) by mouth once daily.   fluticasone (FLONASE) 50 MCG/ACT nasal spray Place 2 sprays into both nostrils once daily.   glucose blood (RIGHTEST GS550 BLOOD GLUCOSE) test strip Use up to four times daily as directed to check blood sugar.   Insulin Glargine (BASAGLAR KWIKPEN) 100 UNIT/ML Inject 16 Units into the skin once daily.   Insulin Pen Needle 32G X 4 MM MISC Use with Basaglar insulin pen.   lisinopril (ZESTRIL) 20 MG tablet Take 1 tablet (20 mg total) by mouth once daily.   metFORMIN (GLUCOPHAGE) 1000 MG tablet Take 1 tablet (1,000 mg total) by mouth 2 (two) times daily with a meal.   mometasone-formoterol (DULERA) 100-5 MCG/ACT AERO Inhale 2 puffs into the lungs 2 (two) times daily.   naproxen sodium (ALEVE) 220 MG tablet Take 220 mg by mouth daily as needed.   Rightest GL300 Lancets MISC Use up to four times daily as directed to check blood sugar.     Allergies:   Patient has no known allergies.   Social History   Socioeconomic History  Marital status: Married    Spouse name: Not on file   Number of children: Not on file   Years of education: Not on file   Highest education level: Not on file  Occupational History   Not on file  Tobacco Use   Smoking status: Every Day    Packs/day: 1.50    Years: 30.00    Pack years: 45.00    Types: Cigarettes   Smokeless tobacco: Former    Types: Chew   Tobacco comments:    Patient has cut down to ~1 ppd since having stroke. Gave [place] Quit info. Patient down to 1/2 to 3/4 ppd  Vaping Use   Vaping Use: Never used  Substance and Sexual Activity   Alcohol use: Yes    Comment: occasionally 1-2 beers   Drug use: Never   Sexual activity: Not on file   Other Topics Concern   Not on file  Social History Narrative   Not on file   Social Determinants of Health   Financial Resource Strain: Not on file  Food Insecurity: No Food Insecurity   Worried About Running Out of Food in the Last Year: Never true   Ran Out of Food in the Last Year: Never true  Transportation Needs: No Transportation Needs   Lack of Transportation (Medical): No   Lack of Transportation (Non-Medical): No  Physical Activity: Not on file  Stress: Not on file  Social Connections: Not on file     Family History: The patient's family history includes COPD in his maternal grandmother and mother; Diabetes in his father, sister, and sister.  ROS:   Please see the history of present illness.     All other systems reviewed and are negative.  EKGs/Labs/Other Studies Reviewed:    The following studies were reviewed today:   EKG:  EKG is  ordered today.  The ekg ordered today demonstrates sinus tachycardia, otherwise normal ECG, heart rate 105  Recent Labs: 10/13/2020: BUN 11; Creatinine, Ser 0.74; Hemoglobin 17.1; Platelets 279; Potassium 3.9; Sodium 133 01/07/2021: TSH 2.400  Recent Lipid Panel    Component Value Date/Time   CHOL 178 01/07/2021 1126   TRIG 192 (H) 01/07/2021 1126   HDL 36 (L) 01/07/2021 1126   CHOLHDL 4.9 01/07/2021 1126   CHOLHDL 7.6 10/14/2020 0559   VLDL UNABLE TO CALCULATE IF TRIGLYCERIDE OVER 400 mg/dL 10/14/2020 0559   LDLCALC 108 (H) 01/07/2021 1126   LDLDIRECT 109.2 (H) 10/14/2020 0559     Risk Assessment/Calculations:          Physical Exam:    VS:  BP 126/70 (BP Location: Left Arm, Patient Position: Sitting, Cuff Size: Normal)    Pulse (!) 105    Ht 5' 9" (1.753 m)    Wt 179 lb (81.2 kg)    SpO2 97%    BMI 26.43 kg/m     Wt Readings from Last 3 Encounters:  02/16/21 179 lb (81.2 kg)  01/19/21 175 lb 4.8 oz (79.5 kg)  01/07/21 170 lb (77.1 kg)     GEN:  Well nourished, well developed in no acute distress HEENT:  Normal NECK: No JVD; No carotid bruits LYMPHATICS: No lymphadenopathy CARDIAC: RRR, no murmurs, rubs, gallops RESPIRATORY: Inspiratory wheezing noted on left lung ABDOMEN: Soft, non-tender, non-distended MUSCULOSKELETAL:  No edema; No deformity  SKIN: Warm and dry NEUROLOGIC:  Alert and oriented x 3 PSYCHIATRIC:  Normal affect   ASSESSMENT:    1. Cerebrovascular accident (CVA), unspecified mechanism ([place])  2. Primary hypertension   3. Smoking    PLAN:    In order of problems listed above:  CVA 10/2020.  Smoking history.  Hypertension.  Place cardiac monitor x4 weeks total to evaluate significant arrhythmia such as A-fib or flutter.  Continue Plavix, Lipitor to 40 mg daily.  Echo showed normal function. Hypertension, BP controlled.  Continue chlorthalidone, lisinopril. Current smoker, cessation advised.  Follow-up after cardiac monitor.       Medication Adjustments/Labs and Tests Ordered: Current medicines are reviewed at length with the patient today.  Concerns regarding medicines are outlined above.  Orders Placed This Encounter  Procedures   LONG TERM MONITOR (3-14 DAYS)   LONG TERM MONITOR (3-14 DAYS)   EKG 12-Lead   No orders of the defined types were placed in this encounter.   Patient Instructions  Medication Instructions:  Your physician recommends that you continue on your current medications as directed. Please refer to the Current Medication list given to you today.  *If you need a refill on your cardiac medications before your next appointment, please call your pharmacy*   Lab Work: None ordered If you have labs (blood work) drawn today and your tests are completely normal, you will receive your results only by: [place] (if you have MyChart) OR A paper copy in the mail If you have any lab test that is abnormal or we need to change your treatment, we will call you to review the results.   Testing/Procedures:  Your physician has recommended  that you wear a Zio XT monitor for 28 days. There will be 2 monitors mailed to your home in the next 4-5 business days. Each monitor you will wear for 14 days each.  This monitor is a medical device that records the hearts electrical activity. Doctors most often use these monitors to diagnose arrhythmias. Arrhythmias are problems with the speed or rhythm of the heartbeat. The monitor is a small device applied to your chest. You can wear one while you do your normal daily activities. While wearing this monitor if you have any symptoms to push the button and record what you felt. Once you have worn this monitor for the period of time provider prescribed (Usually 14 days), you will return the monitor device in the postage paid box. Once it is returned they will download the data collected and provide [place] with a report which the provider will then review and we will call you with those results. Important tips:  Avoid showering during the first 24 hours of wearing the monitor. Avoid excessive sweating to help maximize wear time. Do not submerge the device, no hot tubs, and no swimming pools. Keep any lotions or oils away from the patch. After 24 hours you may shower with the patch on. Take brief showers with your back facing the shower head.  Do not remove patch once it has been placed because that will interrupt data and decrease adhesive wear time. Push the button when you have any symptoms and write down what you were feeling. Once you have completed wearing your monitor, remove and place into box which has postage paid and place in your outgoing mailbox.  If for some reason you have misplaced your box then call our office and we can provide another box and/or mail it off for you.      Follow-Up: At [facility], you and your health needs are our priority.  As part of our continuing mission to provide you with exceptional heart  care, we have created designated Provider Care Teams.  These Care Teams  include your primary Cardiologist (physician) and Advanced Practice Providers (APPs -  Physician Assistants and Nurse Practitioners) who all work together to provide you with the care you need, when you need it.  We recommend signing up for the patient portal called "MyChart".  Sign up information is provided on this After Visit Summary.  MyChart is used to connect with patients for Virtual Visits (Telemedicine).  Patients are able to view lab/test results, encounter notes, upcoming appointments, etc.  Non-urgent messages can be sent to your provider as well.   To learn more about what you can do with MyChart, go to [email or web address].    Your next appointment:   7 week(s)  The format for your next appointment:   In Person  Provider:   You may see Kate Sable, MD or one of the following Advanced Practice Providers on your designated Care Team:   Murray Hodgkins, NP Christell Faith, PA-C Cadence Kathlen Mody, [place]    Other Instructions      Signed, Kate Sable, MD  02/16/2021 10:35 AM    [place]

## 2021-02-16 NOTE — Patient Instructions (Signed)
Medication Instructions:  Your physician recommends that you continue on your current medications as directed. Please refer to the Current Medication list given to you today.  *If you need a refill on your cardiac medications before your next appointment, please call your pharmacy*   Lab Work: None ordered If you have labs (blood work) drawn today and your tests are completely normal, you will receive your results only by: MyChart Message (if you have MyChart) OR A paper copy in the mail If you have any lab test that is abnormal or we need to change your treatment, we will call you to review the results.   Testing/Procedures:  Your physician has recommended that you wear a Zio XT monitor for 28 days. There will be 2 monitors mailed to your home in the next 4-5 business days. Each monitor you will wear for 14 days each.  This monitor is a medical device that records the hearts electrical activity. Doctors most often use these monitors to diagnose arrhythmias. Arrhythmias are problems with the speed or rhythm of the heartbeat. The monitor is a small device applied to your chest. You can wear one while you do your normal daily activities. While wearing this monitor if you have any symptoms to push the button and record what you felt. Once you have worn this monitor for the period of time provider prescribed (Usually 14 days), you will return the monitor device in the postage paid box. Once it is returned they will download the data collected and provide [place] with a report which the provider will then review and we will call you with those results. Important tips:  Avoid showering during the first 24 hours of wearing the monitor. Avoid excessive sweating to help maximize wear time. Do not submerge the device, no hot tubs, and no swimming pools. Keep any lotions or oils away from the patch. After 24 hours you may shower with the patch on. Take brief showers with your back facing the shower head.   Do not remove patch once it has been placed because that will interrupt data and decrease adhesive wear time. Push the button when you have any symptoms and write down what you were feeling. Once you have completed wearing your monitor, remove and place into box which has postage paid and place in your outgoing mailbox.  If for some reason you have misplaced your box then call our office and we can provide another box and/or mail it off for you.      Follow-Up: At [facility], you and your health needs are our priority.  As part of our continuing mission to provide you with exceptional heart care, we have created designated Provider Care Teams.  These Care Teams include your primary Cardiologist (physician) and Advanced Practice Providers (APPs -  Physician Assistants and Nurse Practitioners) who all work together to provide you with the care you need, when you need it.  We recommend signing up for the patient portal called "MyChart".  Sign up information is provided on this After Visit Summary.  MyChart is used to connect with patients for Virtual Visits (Telemedicine).  Patients are able to view lab/test results, encounter notes, upcoming appointments, etc.  Non-urgent messages can be sent to your provider as well.   To learn more about what you can do with MyChart, go to [email or web address].    Your next appointment:   7 week(s)  The format for your next appointment:   In Person  Provider:   You  may see Debbe Odea, MD or one of the following Advanced Practice Providers on your designated Care Team:   Nicolasa Ducking, NP Eula Listen, PA-C Cadence Fransico Lord, [place]    Other Instructions

## 2021-02-20 DIAGNOSIS — I639 Cerebral infarction, unspecified: Secondary | ICD-10-CM

## 2021-02-27 DIAGNOSIS — I639 Cerebral infarction, unspecified: Secondary | ICD-10-CM

## 2021-03-04 ENCOUNTER — Other Ambulatory Visit: Payer: Self-pay

## 2021-03-23 ENCOUNTER — Other Ambulatory Visit: Payer: Self-pay

## 2021-04-05 ENCOUNTER — Other Ambulatory Visit: Payer: Self-pay

## 2021-04-06 ENCOUNTER — Other Ambulatory Visit: Payer: Self-pay

## 2021-04-07 ENCOUNTER — Other Ambulatory Visit: Payer: Self-pay

## 2021-04-07 ENCOUNTER — Ambulatory Visit: Payer: Medicaid Other | Admitting: Gerontology

## 2021-04-07 ENCOUNTER — Encounter: Payer: Self-pay | Admitting: Gerontology

## 2021-04-07 VITALS — BP 128/82 | HR 99 | Temp 97.4°F | Resp 18 | Ht 69.0 in | Wt 181.7 lb

## 2021-04-07 DIAGNOSIS — I10 Essential (primary) hypertension: Secondary | ICD-10-CM

## 2021-04-07 DIAGNOSIS — I639 Cerebral infarction, unspecified: Secondary | ICD-10-CM

## 2021-04-07 DIAGNOSIS — E119 Type 2 diabetes mellitus without complications: Secondary | ICD-10-CM

## 2021-04-07 LAB — GLUCOSE, POCT (MANUAL RESULT ENTRY): POC Glucose: 189 mg/dl — AB (ref 70–99)

## 2021-04-07 LAB — POCT GLYCOSYLATED HEMOGLOBIN (HGB A1C): Hemoglobin A1C: 9.2 % — AB (ref 4.0–5.6)

## 2021-04-07 MED ORDER — METFORMIN HCL 1000 MG PO TABS
1000.0000 mg | ORAL_TABLET | Freq: Two times a day (BID) | ORAL | 2 refills | Status: DC
Start: 2021-04-07 — End: 2021-07-07
  Filled 2021-04-07 – 2021-04-25 (×2): qty 60, 30d supply, fill #0
  Filled 2021-05-03: qty 180, 90d supply, fill #0

## 2021-04-07 MED ORDER — BASAGLAR KWIKPEN 100 UNIT/ML ~~LOC~~ SOPN
19.0000 [IU] | PEN_INJECTOR | Freq: Every day | SUBCUTANEOUS | 2 refills | Status: DC
  Filled 2021-04-07 – 2021-05-03 (×2): qty 15, 78d supply, fill #0
  Filled 2021-07-18: qty 15, 78d supply, fill #1
  Filled 2021-07-19: qty 15, 78d supply, fill #0
  Filled 2021-10-17: qty 15, 78d supply, fill #1

## 2021-04-07 MED ORDER — LISINOPRIL 20 MG PO TABS
20.0000 mg | ORAL_TABLET | Freq: Every day | ORAL | 2 refills | Status: DC
Start: 2021-04-07 — End: 2021-07-07
  Filled 2021-04-07 – 2021-04-25 (×2): qty 30, 30d supply, fill #0
  Filled 2021-05-03: qty 90, 90d supply, fill #0

## 2021-04-07 MED ORDER — CHLORTHALIDONE 25 MG PO TABS
25.0000 mg | ORAL_TABLET | Freq: Every day | ORAL | 2 refills | Status: DC
  Filled 2021-04-07 – 2021-04-25 (×2): qty 30, 30d supply, fill #0
  Filled 2021-05-03: qty 90, 90d supply, fill #0

## 2021-04-07 MED ORDER — ATORVASTATIN CALCIUM 40 MG PO TABS: 40.0000 mg | ORAL_TABLET | Freq: Every day | ORAL | 2 refills | Status: DC

## 2021-04-07 NOTE — Patient Instructions (Signed)

## 2021-04-07 NOTE — Progress Notes (Signed)
? ?Established Patient Office Visit ? ?Subjective:  ?Patient ID: Henry Anderson, male    DOB: 1965/03/22  Age: 56 y.o. MRN: [id number] ? ?CC: F/U ? ? ?HPI ?Henry Anderson is a 56 y/o male who has history of Type 2 diabetes mellitus, Emphysema, Hypertension, Stroke, presents for follow up visit. His HgbA1c  during  visit was 9.2% decreased from 9.3% , he checks his blood glucose at home bid, he states that his fasting readings run between 140 -150 mg/dl. Today's reading was 189 mg/dl. He denies hypo/hyperglycemic symptoms, feels needle sensation on his lower extremities and performs daily foot checks. He states that he's compliant with his medications, and continues to make healthy lifestyle changes. His mono filament test was normal. He states that he smokes less than a pack of cigarette a day has cut down from 3 packs a day.  He was seen at the Cardiologist by Dr Garen Lah on 02/16/21, will continue on current medication, Echo showed normal function, and cardiac monitor for 4 weeks. He denies chest pain, palpitation, lightheadedness. He was also seen at [facility] eye center on 03/11/21 for diabetic eye examine and he will follow up in neuro-opthalmology cap clinic for right sided blurry vision after recent stroke on 10/22. He states that he's doing well and offers no further complaint. ? ? ? ?Past Medical History:  ?Diagnosis Date  ? Diabetes mellitus without complication ([place])   ? Emphysema of lung ([place])   ? Hypertension   ? Stroke [facility])   ? ? ?Past Surgical History:  ?Procedure Laterality Date  ? FOOT SURGERY Right   ? KNEE SURGERY Right   ? SHOULDER SURGERY Left   ? ? ?Family History  ?Problem Relation Age of Onset  ? COPD Mother   ? Diabetes Father   ? Diabetes Sister   ? Diabetes Sister   ? COPD Maternal Grandmother   ? ? ?Social History  ? ?Socioeconomic History  ? Marital status: Married  ?  Spouse name: Not on file  ? Number of children: Not on file  ? Years of education: Not on file  ? Highest education  level: Not on file  ?Occupational History  ? Not on file  ?Tobacco Use  ? Smoking status: Every Day  ?  Packs/day: 1.50  ?  Years: 30.00  ?  Pack years: 45.00  ?  Types: Cigarettes  ? Smokeless tobacco: Former  ?  Types: Chew  ? Tobacco comments:  ?  Patient has cut down to ~1 ppd since having stroke. Gave [place] Quit info. Patient down to 1/2 to 3/4 ppd  ?Vaping Use  ? Vaping Use: Never used  ?Substance and Sexual Activity  ? Alcohol use: Yes  ?  Comment: occasionally 1-2 beers  ? Drug use: Never  ? Sexual activity: Not on file  ?Other Topics Concern  ? Not on file  ?Social History Narrative  ? Not on file  ? ?Social Determinants of Health  ? ?Financial Resource Strain: Not on file  ?Food Insecurity: No Food Insecurity  ? Worried About Charity fundraiser in the Last Year: Never true  ? Ran Out of Food in the Last Year: Never true  ?Transportation Needs: No Transportation Needs  ? Lack of Transportation (Medical): No  ? Lack of Transportation (Non-Medical): No  ?Physical Activity: Not on file  ?Stress: Not on file  ?Social Connections: Not on file  ?Intimate Partner Violence: Not on file  ? ? ?Outpatient Medications Prior  to Visit  ?Medication Sig Dispense Refill  ? acetaminophen (TYLENOL) 325 MG tablet Take 2 tablets (650 mg total) by mouth every 4 (four) hours as needed for mild pain (or temp > 37.5 C (99.5 F)).    ? albuterol (VENTOLIN HFA) 108 (90 Base) MCG/ACT inhaler Inhale 2 puffs into the lungs every 6 (six) hours as needed for wheezing or shortness of breath. 8.5 g 2  ? atorvastatin (LIPITOR) 40 MG tablet Take 1 tablet (40 mg total) by mouth once daily. 30 tablet 2  ? blood glucose meter kit and supplies KIT Use up to four times daily as directed to check blood sugar. 1 each 0  ? chlorthalidone (HYGROTON) 25 MG tablet Take 1 tablet (25 mg total) by mouth once daily. 30 tablet 2  ? clopidogrel (PLAVIX) 75 MG tablet Take 1 tablet (75 mg total) by mouth once daily. 90 tablet 4  ? fluticasone (FLONASE) 50 MCG/ACT  nasal spray Place 2 sprays into both nostrils once daily. 16 g 0  ? glucose blood (RIGHTEST GS550 BLOOD GLUCOSE) test strip Use up to four times daily as directed to check blood sugar. 100 each 11  ? Insulin Glargine (BASAGLAR KWIKPEN) 100 UNIT/ML Inject 16 Units into the skin once daily. 15 mL 2  ? Insulin Pen Needle 32G X 4 MM MISC Use with Basaglar insulin pen. 100 each 2  ? lisinopril (ZESTRIL) 20 MG tablet Take 1 tablet (20 mg total) by mouth once daily. 30 tablet 2  ? metFORMIN (GLUCOPHAGE) 1000 MG tablet Take 1 tablet (1,000 mg total) by mouth 2 (two) times daily with a meal. 60 tablet 2  ? mometasone-formoterol (DULERA) 100-5 MCG/ACT AERO Inhale 2 puffs into the lungs 2 (two) times daily. 1 each 2  ? naproxen sodium (ALEVE) 220 MG tablet Take 220 mg by mouth daily as needed.    ? nicotine (NICODERM CQ - DOSED IN MG/24 HOURS) 21 mg/24hr patch Place 1 patch (21 mg total) onto the skin daily. (Patient not taking: Reported on 02/16/2021) 28 patch 0  ? Rightest GL300 Lancets MISC Use up to four times daily as directed to check blood sugar. 100 each 11  ? ?No facility-administered medications prior to visit.  ? ? ?No Known Allergies ? ?ROS ?Review of Systems  ?Constitutional: Negative.   ?HENT: Negative.    ?Eyes: Negative.   ?Respiratory: Negative.    ?Gastrointestinal: Negative.   ?Endocrine: Negative.   ?Genitourinary: Negative.   ?Skin: Negative.   ?Neurological: Negative.   ?Psychiatric/Behavioral: Negative.    ? ?  ?Objective:  ?  ?Physical Exam ?Constitutional:   ?   Appearance: Normal appearance.  ?HENT:  ?   Nose: Nose normal.  ?Cardiovascular:  ?   Rate and Rhythm: Normal rate.  ?Abdominal:  ?   Palpations: Abdomen is soft.  ?Musculoskeletal:     ?   General: Normal range of motion.  ?   Cervical back: Normal range of motion.  ?Skin: ?   General: Skin is warm and dry.  ?Neurological:  ?   Mental Status: He is alert.  ?Psychiatric:     ?   Mood and Affect: Mood normal.  ? ? ?There were no vitals taken for  this visit. ?Wt Readings from Last 3 Encounters:  ?02/16/21 179 lb (81.2 kg)  ?01/19/21 175 lb 4.8 oz (79.5 kg)  ?01/07/21 170 lb (77.1 kg)  ? ? ? ?Health Maintenance Due  ?Topic Date Due  ? COVID-19 Vaccine (1) Never done  ?  FOOT EXAM  Never done  ? OPHTHALMOLOGY EXAM  Never done  ? Hepatitis C Screening  Never done  ? Zoster Vaccines- Shingrix (1 of 2) Never done  ? COLONOSCOPY (Pts 45-34yr Insurance coverage will need to be confirmed)  Never done  ? INFLUENZA VACCINE  Never done  ? ? ?There are no preventive care reminders to display for this patient. ? ?Lab Results  ?Component Value Date  ? TSH 2.400 01/07/2021  ? ?Lab Results  ?Component Value Date  ? WBC 12.5 (H) 10/13/2020  ? HGB 17.1 (H) 10/13/2020  ? HCT 47.7 10/13/2020  ? MCV 85.6 10/13/2020  ? PLT 279 10/13/2020  ? ?Lab Results  ?Component Value Date  ? NA 133 (L) 10/13/2020  ? K 3.9 10/13/2020  ? CO2 28 10/13/2020  ? GLUCOSE 270 (H) 10/13/2020  ? BUN 11 10/13/2020  ? CREATININE 0.74 10/13/2020  ? BILITOT 0.7 11/26/2015  ? ALKPHOS 80 11/26/2015  ? AST 25 11/26/2015  ? ALT 17 11/26/2015  ? PROT 6.9 11/26/2015  ? ALBUMIN 3.8 11/26/2015  ? CALCIUM 9.8 10/13/2020  ? ANIONGAP 9 10/13/2020  ? ?Lab Results  ?Component Value Date  ? CHOL 178 01/07/2021  ? ?Lab Results  ?Component Value Date  ? HDL 36 (L) 01/07/2021  ? ?Lab Results  ?Component Value Date  ? LDLCALC 108 (H) 01/07/2021  ? ?Lab Results  ?Component Value Date  ? TRIG 192 (H) 01/07/2021  ? ?Lab Results  ?Component Value Date  ? CHOLHDL 4.9 01/07/2021  ? ?Lab Results  ?Component Value Date  ? HGBA1C 9.3 (H) 01/07/2021  ? ? ?  ?Assessment & Plan:  ? ?1. Type 2 diabetes mellitus without complication, without long-term current use of insulin ([place] ?- His HgbA1c was 9.2%,goal should be less than 7%. His Glargine was increased to 19 units at qhs, continue checking his blood glucose bid, record and bring log to follow up appointment. He was encouraged to continue on low carb/non concentrated sweet diet. ?-  Continue  metformin (GLUCOPHAGE) 1000 MG tablet; Take 1 tablet (1,000 mg total) by mouth 2 (two) times daily with a meal.   ? ?2. Cerebrovascular accident (CVA), unspecified mechanism ([place] ?He will conti

## 2021-04-13 ENCOUNTER — Ambulatory Visit: Payer: Self-pay | Admitting: Cardiology

## 2021-04-20 ENCOUNTER — Encounter: Payer: Self-pay | Admitting: Cardiology

## 2021-04-20 ENCOUNTER — Ambulatory Visit (INDEPENDENT_AMBULATORY_CARE_PROVIDER_SITE_OTHER): Payer: Self-pay | Admitting: Cardiology

## 2021-04-20 ENCOUNTER — Other Ambulatory Visit: Payer: Self-pay

## 2021-04-20 VITALS — BP 120/80 | HR 106 | Ht 69.0 in | Wt 181.0 lb

## 2021-04-20 DIAGNOSIS — E785 Hyperlipidemia, unspecified: Secondary | ICD-10-CM

## 2021-04-20 DIAGNOSIS — IMO0001 Reserved for inherently not codable concepts without codable children: Secondary | ICD-10-CM

## 2021-04-20 DIAGNOSIS — J439 Emphysema, unspecified: Secondary | ICD-10-CM

## 2021-04-20 DIAGNOSIS — I10 Essential (primary) hypertension: Secondary | ICD-10-CM

## 2021-04-20 DIAGNOSIS — I639 Cerebral infarction, unspecified: Secondary | ICD-10-CM

## 2021-04-20 DIAGNOSIS — F17200 Nicotine dependence, unspecified, uncomplicated: Secondary | ICD-10-CM

## 2021-04-20 MED ORDER — ATORVASTATIN CALCIUM 80 MG PO TABS
80.0000 mg | ORAL_TABLET | Freq: Every day | ORAL | 3 refills | Status: DC
  Filled 2021-04-20 – 2021-07-06 (×2): qty 90, 90d supply, fill #0
  Filled 2021-07-06: qty 90, 90d supply, fill #1

## 2021-04-20 NOTE — Patient Instructions (Signed)
Medication Instructions:  ?Your physician has recommended you make the following change in your medication:  ? ?INCREASE Atorvastatin to 80 mg daily. An Rx has been sent to your pharmacy. ? ? ?*If you need a refill on your cardiac medications before your next appointment, please call your pharmacy* ? ? ?Lab Work: ?Your physician recommends that you return for a FASTING lipid profile: in 6 months  ? ?Please have your lab drawn at the [facility]. No appt needed lab hours M-F 7am-6pm. Stop at the Registration desk to check in. ? ?If you have labs (blood work) drawn today and your tests are completely normal, you will receive your results only by: ?MyChart Message (if you have MyChart) OR ?A paper copy in the mail ?If you have any lab test that is abnormal or we need to change your treatment, we will call you to review the results. ? ? ?Testing/Procedures: ?None ordered ? ? ?Follow-Up: ?At [facility], you and your health needs are our priority.  As part of our continuing mission to provide you with exceptional heart care, we have created designated Provider Care Teams.  These Care Teams include your primary Cardiologist (physician) and Advanced Practice Providers (APPs -  Physician Assistants and Nurse Practitioners) who all work together to provide you with the care you need, when you need it. ? ?We recommend signing up for the patient portal called "MyChart".  Sign up information is provided on this After Visit Summary.  MyChart is used to connect with patients for Virtual Visits (Telemedicine).  Patients are able to view lab/test results, encounter notes, upcoming appointments, etc.  Non-urgent messages can be sent to your provider as well.   ?To learn more about what you can do with MyChart, go to [email or web address].   ? ?Your next appointment:   ?Your physician wants you to follow-up in: 1 year You will receive a reminder letter in the mail two months in advance. If you don't receive a letter,  please call our office to schedule the follow-up appointment. ? ? ?You have been referred to [facility] Pulmonology. Their office will call you to schedule.  ? ?The format for your next appointment:   ?In Person ? ?Provider:   ?You may see Debbe Odea, MD or one of the following Advanced Practice Providers on your designated Care Team:   ?Nicolasa Ducking, NP ?Eula Listen, PA-C ?Cadence Fransico Bush, PA-C{ ? ? ? ?Other Instructions ?N/A ? ?Important Information About Sugar ? ? ? ? ? ? ?

## 2021-04-20 NOTE — Progress Notes (Signed)
?Cardiology Office Note:   ? ?Date:  04/20/2021  ? ?ID:  Henry Anderson, DOB 1965-03-08, MRN [id number] ? ?PCP:  Pcp, No ?  ?[facility] Providers ?Cardiologist:  None    ? ?Referring MD: No ref. provider found  ? ?Chief Complaint  ?Patient presents with  ? Other  ?  7 week follow up -- Meds reviewed verbally with patient.   ? ? ?History of Present Illness:   ? ?Henry Anderson is a 56 y.o. male with a hx of hypertension, diabetes, current smoker x40+ years, COPD, CVA presenting for follow-up.  Previously seen to establish care due to history of stroke.   ? ?Evaluated by neurology, 4-week cardiac monitor recommended to evaluate any significant arrhythmias.  Cardiac monitor was placed, he now presents for results.  Denies any new complaints or concerns.  He is working on quitting smoking, cut back from 3 packs/day to half a pack per day.  He is pulmonary medicine physician recently retired.  Has some vision issues since having a stroke. ? ?Prior notes ?Echo 10/2020 showed EF 60 to 33%, normal diastolic function, no significant valvular abnormalities.  .  30-day cardiac monitor requested to evaluate presence of arrhythmias. ?Brain MRI 2020, infarct in the right midbrain and thalamus. ? ? ?Past Medical History:  ?Diagnosis Date  ? Diabetes mellitus without complication ([place])   ? Emphysema of lung ([place])   ? Hypertension   ? Stroke [facility])   ? ? ?Past Surgical History:  ?Procedure Laterality Date  ? FOOT SURGERY Right   ? KNEE SURGERY Right   ? SHOULDER SURGERY Left   ? ? ?Current Medications: ?Current Meds  ?Medication Sig  ? acetaminophen (TYLENOL) 325 MG tablet Take 2 tablets (650 mg total) by mouth every 4 (four) hours as needed for mild pain (or temp > 37.5 C (99.5 F)).  ? albuterol (VENTOLIN HFA) 108 (90 Base) MCG/ACT inhaler Inhale 2 puffs into the lungs every 6 (six) hours as needed for wheezing or shortness of breath.  ? Aspirin (VAZALORE) 81 MG CAPS Take 1 tablet by mouth daily.  ? blood glucose meter kit and  supplies KIT Use up to four times daily as directed to check blood sugar.  ? chlorthalidone (HYGROTON) 25 MG tablet Take 1 tablet (25 mg total) by mouth once daily.  ? clopidogrel (PLAVIX) 75 MG tablet Take 1 tablet (75 mg total) by mouth once daily.  ? fluticasone (FLONASE) 50 MCG/ACT nasal spray Place 2 sprays into both nostrils once daily.  ? glucose blood (RIGHTEST GS550 BLOOD GLUCOSE) test strip Use up to four times daily as directed to check blood sugar.  ? Insulin Glargine (BASAGLAR KWIKPEN) 100 UNIT/ML Inject 19 Units into the skin once daily.  ? Insulin Pen Needle 32G X 4 MM MISC Use with Basaglar insulin pen.  ? lisinopril (ZESTRIL) 20 MG tablet Take 1 tablet (20 mg total) by mouth once daily.  ? metFORMIN (GLUCOPHAGE) 1000 MG tablet Take 1 tablet (1,000 mg total) by mouth 2 (two) times daily with a meal.  ? mometasone-formoterol (DULERA) 100-5 MCG/ACT AERO Inhale 2 puffs into the lungs 2 (two) times daily.  ? naproxen sodium (ALEVE) 220 MG tablet Take 220 mg by mouth daily as needed.  ? Rightest GL300 Lancets MISC Use up to four times daily as directed to check blood sugar.  ? [DISCONTINUED] atorvastatin (LIPITOR) 40 MG tablet Take 1 tablet (40 mg total) by mouth once daily.  ?  ? ?Allergies:  Patient has no known allergies.  ? ?Social History  ? ?Socioeconomic History  ? Marital status: Married  ?  Spouse name: Not on file  ? Number of children: Not on file  ? Years of education: Not on file  ? Highest education level: Not on file  ?Occupational History  ? Not on file  ?Tobacco Use  ? Smoking status: Every Day  ?  Packs/day: 1.50  ?  Years: 30.00  ?  Pack years: 45.00  ?  Types: Cigarettes  ? Smokeless tobacco: Former  ?  Types: Chew  ? Tobacco comments:  ?  Patient has cut down to ~1 ppd since having stroke. Gave [place] Quit info. Patient down to 1/2 ppd  ?Vaping Use  ? Vaping Use: Never used  ?Substance and Sexual Activity  ? Alcohol use: Yes  ?  Comment: occasionally 1-2 beers  ? Drug use: Never  ?  Sexual activity: Not on file  ?Other Topics Concern  ? Not on file  ?Social History Narrative  ? Not on file  ? ?Social Determinants of Health  ? ?Financial Resource Strain: Not on file  ?Food Insecurity: No Food Insecurity  ? Worried About Charity fundraiser in the Last Year: Never true  ? Ran Out of Food in the Last Year: Never true  ?Transportation Needs: No Transportation Needs  ? Lack of Transportation (Medical): No  ? Lack of Transportation (Non-Medical): No  ?Physical Activity: Not on file  ?Stress: Not on file  ?Social Connections: Not on file  ?  ? ?Family History: ?The patient's family history includes COPD in his maternal grandmother and mother; Diabetes in his father, sister, and sister. ? ?ROS:   ?Please see the history of present illness.    ? All other systems reviewed and are negative. ? ?EKGs/Labs/Other Studies Reviewed:   ? ?The following studies were reviewed today: ? ? ?EKG:  EKG is ordered today.  The ekg ordered today demonstrates sinus tachycardia, otherwise normal ECG, heart rate 106 ? ?Recent Labs: ?10/13/2020: BUN 11; Creatinine, Ser 0.74; Hemoglobin 17.1; Platelets 279; Potassium 3.9; Sodium 133 ?01/07/2021: TSH 2.400  ?Recent Lipid Panel ?   ?Component Value Date/Time  ? CHOL 178 01/07/2021 1126  ? TRIG 192 (H) 01/07/2021 1126  ? HDL 36 (L) 01/07/2021 1126  ? CHOLHDL 4.9 01/07/2021 1126  ? CHOLHDL 7.6 10/14/2020 0559  ? VLDL UNABLE TO CALCULATE IF TRIGLYCERIDE OVER 400 mg/dL 10/14/2020 0559  ? LDLCALC 108 (H) 01/07/2021 1126  ? LDLDIRECT 109.2 (H) 10/14/2020 0559  ? ? ? ?Risk Assessment/Calculations:   ? ? ?    ? ?Physical Exam:   ? ?VS:  BP 120/80 (BP Location: Left Arm, Patient Position: Sitting, Cuff Size: Normal)   Pulse (!) 106   Ht 5' 9"  (1.753 m)   Wt 181 lb (82.1 kg)   SpO2 98%   BMI 26.73 kg/m?    ? ?Wt Readings from Last 3 Encounters:  ?04/20/21 181 lb (82.1 kg)  ?04/07/21 181 lb 11.2 oz (82.4 kg)  ?02/16/21 179 lb (81.2 kg)  ?  ? ?GEN:  Well nourished, well developed in no  acute distress ?HEENT: Normal ?NECK: No JVD; No carotid bruits ?LYMPHATICS: No lymphadenopathy ?CARDIAC: RRR, no murmurs, rubs, gallops ?RESPIRATORY: Inspiratory wheezing noted on left lung ?ABDOMEN: Soft, non-tender, non-distended ?MUSCULOSKELETAL:  No edema; No deformity  ?SKIN: Warm and dry ?NEUROLOGIC:  Alert and oriented x 3 ?PSYCHIATRIC:  Normal affect  ? ?ASSESSMENT:   ? ?1. Cerebrovascular  accident (CVA), unspecified mechanism ([place])   ?2. Primary hypertension   ?3. Smoking   ?4. Pulmonary emphysema, unspecified emphysema type ([place])   ?5. Hyperlipidemia, unspecified hyperlipidemia type   ? ? ?PLAN:   ? ?In order of problems listed above: ? ?CVA 10/2020.  Risk factors include hypertension, diabetes, smoking history.  Cardiac monitor x4 weeks did not reveal any evidence for A-fib or atrial flutter. continue Plavix, increase Lipitor to 80 mg daily.  Echo showed normal function. ?Hypertension, BP controlled.  Continue chlorthalidone, lisinopril. ?Current smoker, encouraged on reducing smoking quantity, complete cessation advised. ?History of COPD, refer to pulmonary medicine. ?Hyperlipidemia, cholesterol not controlled.  Increase Lipitor to 80 mg daily, repeat fasting lipid profile in 6 months. ? ?Follow-up yearly. ? ?   ? ?Medication Adjustments/Labs and Tests Ordered: ?Current medicines are reviewed at length with the patient today.  Concerns regarding medicines are outlined above.  ?Orders Placed This Encounter  ?Procedures  ? Lipid panel  ? Ambulatory referral to Pulmonology  ? EKG 12-Lead  ? ?Meds ordered this encounter  ?Medications  ? atorvastatin (LIPITOR) 80 MG tablet  ?  Sig: Take 1 tablet (80 mg total) by mouth once daily.  ?  Dispense:  90 tablet  ?  Refill:  3  ?  Dosage increase  ? ? ?Patient Instructions  ?Medication Instructions:  ?Your physician has recommended you make the following change in your medication:  ? ?INCREASE Atorvastatin to 80 mg daily. An Rx has been sent to your  pharmacy. ? ? ?*If you need a refill on your cardiac medications before your next appointment, please call your pharmacy* ? ? ?Lab Work: ?Your physician recommends that you return for a FASTING lipid profile: in 6 months  ? ?Ple

## 2021-04-26 ENCOUNTER — Other Ambulatory Visit: Payer: Self-pay

## 2021-05-03 ENCOUNTER — Other Ambulatory Visit: Payer: Self-pay

## 2021-05-14 ENCOUNTER — Other Ambulatory Visit: Payer: Self-pay

## 2021-05-18 ENCOUNTER — Encounter: Payer: Self-pay | Admitting: *Deleted

## 2021-05-18 DIAGNOSIS — Z006 Encounter for examination for normal comparison and control in clinical research program: Secondary | ICD-10-CM

## 2021-05-18 NOTE — Research (Signed)
Spoke with pt wife Henry Anderson about [employer]. She requested info me sent to her email address for Henry Anderson. A copy of the [employer] consent sent for her and Mays to review. Encouraged her to call with any questions she or [name] might have.  ?

## 2021-06-04 ENCOUNTER — Telehealth: Payer: Self-pay | Admitting: Pharmacy Technician

## 2021-06-04 NOTE — Telephone Encounter (Signed)
Received updated proof of income.  Patient eligible to receive medication assistance at [facility] until time for re-certification in 2024, and as long as eligibility requirements continue to be met.  [name] J. Dontreal Miera Care Manager [facility]  

## 2021-06-05 ENCOUNTER — Other Ambulatory Visit: Payer: Self-pay

## 2021-06-25 ENCOUNTER — Encounter: Payer: Self-pay | Admitting: *Deleted

## 2021-06-25 DIAGNOSIS — Z006 Encounter for examination for normal comparison and control in clinical research program: Secondary | ICD-10-CM

## 2021-06-25 NOTE — Research (Signed)
Spoke with Cala Bradford (pt wife) about [employer] research. She wants to talk about the study after Mr Joines sees his dr. Asked [place] to call back the end of the month.

## 2021-07-06 ENCOUNTER — Other Ambulatory Visit: Payer: Self-pay

## 2021-07-06 MED ORDER — ATORVASTATIN CALCIUM 40 MG PO TABS
80.0000 mg | ORAL_TABLET | Freq: Every day | ORAL | 2 refills | Status: DC
Start: 2021-04-19 — End: 2022-02-23
  Filled 2021-07-06: qty 180, 90d supply, fill #0
  Filled 2021-10-17: qty 180, 90d supply, fill #1
  Filled 2022-01-24: qty 180, 90d supply, fill #2

## 2021-07-07 ENCOUNTER — Encounter: Payer: Self-pay | Admitting: Gerontology

## 2021-07-07 ENCOUNTER — Other Ambulatory Visit: Payer: Self-pay

## 2021-07-07 ENCOUNTER — Ambulatory Visit: Payer: Medicaid Other | Admitting: Gerontology

## 2021-07-07 VITALS — BP 111/82 | HR 101 | Temp 97.7°F | Resp 16 | Ht 69.0 in | Wt 175.9 lb

## 2021-07-07 DIAGNOSIS — I10 Essential (primary) hypertension: Secondary | ICD-10-CM

## 2021-07-07 DIAGNOSIS — E119 Type 2 diabetes mellitus without complications: Secondary | ICD-10-CM

## 2021-07-07 DIAGNOSIS — Z0000 Encounter for general adult medical examination without abnormal findings: Secondary | ICD-10-CM

## 2021-07-07 DIAGNOSIS — E785 Hyperlipidemia, unspecified: Secondary | ICD-10-CM

## 2021-07-07 DIAGNOSIS — G4730 Sleep apnea, unspecified: Secondary | ICD-10-CM

## 2021-07-07 LAB — POCT GLYCOSYLATED HEMOGLOBIN (HGB A1C): Hemoglobin A1C: 8.4 % — AB (ref 4.0–5.6)

## 2021-07-07 LAB — GLUCOSE, POCT (MANUAL RESULT ENTRY): POC Glucose: 203 mg/dl — AB (ref 70–99)

## 2021-07-07 MED ORDER — CHLORTHALIDONE 25 MG PO TABS
25.0000 mg | ORAL_TABLET | Freq: Every day | ORAL | 2 refills | Status: DC
  Filled 2021-07-07 – 2021-07-29 (×3): qty 30, 30d supply, fill #0
  Filled 2021-09-01: qty 30, 30d supply, fill #1
  Filled 2021-10-17: qty 30, 30d supply, fill #2

## 2021-07-07 MED ORDER — METFORMIN HCL 1000 MG PO TABS
1000.0000 mg | ORAL_TABLET | Freq: Two times a day (BID) | ORAL | 2 refills | Status: DC
  Filled 2021-07-07 – 2021-07-29 (×3): qty 60, 30d supply, fill #0
  Filled 2021-09-19: qty 60, 30d supply, fill #1
  Filled 2021-10-17: qty 60, 30d supply, fill #2

## 2021-07-07 MED ORDER — LISINOPRIL 20 MG PO TABS
20.0000 mg | ORAL_TABLET | Freq: Every day | ORAL | 2 refills | Status: DC
  Filled 2021-07-07 – 2021-07-29 (×3): qty 30, 30d supply, fill #0
  Filled 2021-09-01: qty 30, 30d supply, fill #1
  Filled 2021-10-17: qty 30, 30d supply, fill #2

## 2021-07-07 NOTE — Patient Instructions (Signed)

## 2021-07-07 NOTE — Progress Notes (Signed)
Established Patient Office Visit  Subjective   Patient ID: BEAUFORD LANDO, male    DOB: 02-17-1965  Age: 56 y.o. MRN: [id number]  Chief Complaint  Patient presents with   Follow-up   Diabetes    HPI  JOSEALBERTO MONTALTO is a 56 y/o male who has history of Type 2 diabetes mellitus, Emphysema, Hypertension, Stroke, presents for follow up visit. His HgbA1c  during  visit decreased from 9.2% to 8.4%. He checks his blood glucose bid, and it's usually between 120-130 mg/dl.  He denies hypo or hyperglycemic symptoms, peripheral neuropathy and performs daily foot checks.  He states that he is compliant with his medications, denies side effects and continues to make healthy lifestyle changes. He was seen by the Cardiologist Dr Kate Sable on 04/20/21  and states that Cardiac monitor x4 weeks did not reveal any evidence for A-fib or atrial flutter. continue Plavix, increase Lipitor to 80 mg daily.  Echo showed normal function, Hypertension, BP controlled.  He was also seen by Neuro Ophthalmology at [facility] on 05/11/2021 due to right-sided blurry vision after CVA in October 2022 by  Dr Estill Bakes  and was given prescription for eye glasses.  Overall, he states that he is doing well and offers no further complaint.     Review of Systems  Constitutional: Negative.   Eyes: Negative.   Respiratory: Negative.    Cardiovascular: Negative.   Neurological: Negative.   Endo/Heme/Allergies: Negative.   Psychiatric/Behavioral: Negative.        Objective:     BP 111/82 (BP Location: Left Arm, Patient Position: Sitting, Cuff Size: Large)   Pulse (!) 101   Temp 97.7 F (36.5 C) (Oral)   Resp 16   Ht 5' 9" (1.753 m)   Wt 175 lb 14.4 oz (79.8 kg)   SpO2 96%   BMI 25.98 kg/m  BP Readings from Last 3 Encounters:  07/07/21 111/82  04/20/21 120/80  04/07/21 128/82   Wt Readings from Last 3 Encounters:  07/07/21 175 lb 14.4 oz (79.8 kg)  04/20/21 181 lb (82.1 kg)  04/07/21 181 lb 11.2 oz (82.4 kg)       Physical Exam HENT:     Head: Normocephalic and atraumatic.     Mouth/Throat:     Mouth: Mucous membranes are moist.  Eyes:     Extraocular Movements: Extraocular movements intact.     Conjunctiva/sclera: Conjunctivae normal.     Pupils: Pupils are equal, round, and reactive to light.  Cardiovascular:     Rate and Rhythm: Normal rate and regular rhythm.     Pulses: Normal pulses.     Heart sounds: Normal heart sounds.  Pulmonary:     Effort: Pulmonary effort is normal.     Breath sounds: Normal breath sounds.  Skin:    General: Skin is warm.  Neurological:     General: No focal deficit present.     Mental Status: He is alert and oriented to person, place, and time. Mental status is at baseline.  Psychiatric:        Mood and Affect: Mood normal.        Behavior: Behavior normal.        Thought Content: Thought content normal.        Judgment: Judgment normal.      Results for orders placed or performed in visit on 07/07/21  POCT Glucose (CBG)  Result Value Ref Range   POC Glucose 203 (A) 70 - 99 mg/dl  POCT  HgB A1C  Result Value Ref Range   Hemoglobin A1C 8.4 (A) 4.0 - 5.6 %   HbA1c POC (<> result, manual entry)     HbA1c, POC (prediabetic range)     HbA1c, POC (controlled diabetic range)      Last CBC Lab Results  Component Value Date   WBC 12.5 (H) 10/13/2020   HGB 17.1 (H) 10/13/2020   HCT 47.7 10/13/2020   MCV 85.6 10/13/2020   MCH 30.7 10/13/2020   RDW 12.4 10/13/2020   PLT 279 48/88/9169   Last metabolic panel Lab Results  Component Value Date   GLUCOSE 270 (H) 10/13/2020   NA 133 (L) 10/13/2020   K 3.9 10/13/2020   CL 96 (L) 10/13/2020   CO2 28 10/13/2020   BUN 11 10/13/2020   CREATININE 0.74 10/13/2020   GFRNONAA >60 10/13/2020   CALCIUM 9.8 10/13/2020   PROT 6.9 11/26/2015   ALBUMIN 3.8 11/26/2015   BILITOT 0.7 11/26/2015   ALKPHOS 80 11/26/2015   AST 25 11/26/2015   ALT 17 11/26/2015   ANIONGAP 9 10/13/2020   Last lipids Lab  Results  Component Value Date   CHOL 178 01/07/2021   HDL 36 (L) 01/07/2021   LDLCALC 108 (H) 01/07/2021   LDLDIRECT 109.2 (H) 10/14/2020   TRIG 192 (H) 01/07/2021   CHOLHDL 4.9 01/07/2021   Last hemoglobin A1c Lab Results  Component Value Date   HGBA1C 8.4 (A) 07/07/2021   Last thyroid functions Lab Results  Component Value Date   TSH 2.400 01/07/2021   Last vitamin D No results found for: "25OHVITD2", "25OHVITD3", "VD25OH"    The ASCVD Risk score (Arnett DK, et al., 2019) failed to calculate for the following reasons:   The patient has a prior MI or stroke diagnosis    Assessment & Plan:   1. Type 2 diabetes mellitus without complication, without long-term current use of insulin (HCC) -His diabetes is improving, his hemoglobin A1c was 8.4% and his goal should be less than 7%.  He will continue on current medication, low carbohydrate/non concentrated sweet diet and exercise as tolerated. - POCT Glucose (CBG) - POCT HgB A1C - metFORMIN (GLUCOPHAGE) 1000 MG tablet; Take 1 tablet (1,000 mg total) by mouth 2 (two) times daily with a meal.  Dispense: 60 tablet; Refill: 2 - HgB A1c; Future - HgB A1c  2. Hypertension, unspecified type -His blood pressure is under control and he will continue on current medication, DASH diet and exercise as tolerated. - Comp Met (CMET); Future - chlorthalidone (HYGROTON) 25 MG tablet; Take 1 tablet (25 mg total) by mouth once daily.  Dispense: 30 tablet; Refill: 2 - lisinopril (ZESTRIL) 20 MG tablet; Take 1 tablet (20 mg total) by mouth once daily.  Dispense: 30 tablet; Refill: 2 - Comp Met (CMET)  3. Health care maintenance -Routine labs will be checked. - CBC w/Diff; Future - Lipid panel; Future - Vitamin D (25 hydroxy); Future - Vitamin D (25 hydroxy) - Lipid panel - CBC w/Diff  4. Sleep apnea, unspecified type -He will follow[facility] at [facility] for sleep study to evaluate sleep apnea. - Ambulatory referral to Neurology  5.  Hyperlipidemia, unspecified hyperlipidemia type -He will continue on current medication, low-fat/cholesterol diet and exercise as tolerated lipid panel will be checked. - Lipid panel    Return in about 15 weeks (around 10/20/2021), or if symptoms worsen or fail to improve.    Latoy Labriola Jerold Coombe, NP

## 2021-07-08 ENCOUNTER — Ambulatory Visit (INDEPENDENT_AMBULATORY_CARE_PROVIDER_SITE_OTHER): Payer: Self-pay | Admitting: Adult Health

## 2021-07-08 ENCOUNTER — Other Ambulatory Visit: Payer: Self-pay | Admitting: Gerontology

## 2021-07-08 ENCOUNTER — Other Ambulatory Visit: Payer: Self-pay

## 2021-07-08 ENCOUNTER — Encounter: Payer: Self-pay | Admitting: Adult Health

## 2021-07-08 VITALS — BP 122/88 | HR 96 | Ht 69.0 in | Wt 177.0 lb

## 2021-07-08 DIAGNOSIS — I639 Cerebral infarction, unspecified: Secondary | ICD-10-CM

## 2021-07-08 DIAGNOSIS — E559 Vitamin D deficiency, unspecified: Secondary | ICD-10-CM

## 2021-07-08 LAB — CBC WITH DIFFERENTIAL/PLATELET
Basophils Absolute: 0.1 10*3/uL (ref 0.0–0.2)
Basos: 1 %
EOS (ABSOLUTE): 0.2 10*3/uL (ref 0.0–0.4)
Eos: 2 %
Hematocrit: 43.4 % (ref 37.5–51.0)
Hemoglobin: 15 g/dL (ref 13.0–17.7)
Immature Grans (Abs): 0.1 10*3/uL (ref 0.0–0.1)
Immature Granulocytes: 1 %
Lymphocytes Absolute: 2.7 10*3/uL (ref 0.7–3.1)
Lymphs: 21 %
MCH: 30.2 pg (ref 26.6–33.0)
MCHC: 34.6 g/dL (ref 31.5–35.7)
MCV: 88 fL (ref 79–97)
Monocytes Absolute: 0.7 10*3/uL (ref 0.1–0.9)
Monocytes: 6 %
Neutrophils Absolute: 9 10*3/uL — ABNORMAL HIGH (ref 1.4–7.0)
Neutrophils: 69 %
Platelets: 318 10*3/uL (ref 150–450)
RBC: 4.96 x10E6/uL (ref 4.14–5.80)
RDW: 12.7 % (ref 11.6–15.4)
WBC: 12.7 10*3/uL — ABNORMAL HIGH (ref 3.4–10.8)

## 2021-07-08 LAB — COMPREHENSIVE METABOLIC PANEL
ALT: 24 IU/L (ref 0–44)
AST: 18 IU/L (ref 0–40)
Albumin/Globulin Ratio: 1.6 (ref 1.2–2.2)
Albumin: 4.8 g/dL (ref 3.8–4.9)
Alkaline Phosphatase: 81 IU/L (ref 44–121)
BUN/Creatinine Ratio: 15 (ref 9–20)
BUN: 14 mg/dL (ref 6–24)
Bilirubin Total: 0.3 mg/dL (ref 0.0–1.2)
CO2: 24 mmol/L (ref 20–29)
Calcium: 10.2 mg/dL (ref 8.7–10.2)
Chloride: 100 mmol/L (ref 96–106)
Creatinine, Ser: 0.94 mg/dL (ref 0.76–1.27)
Globulin, Total: 3 g/dL (ref 1.5–4.5)
Glucose: 213 mg/dL — ABNORMAL HIGH (ref 70–99)
Potassium: 4.7 mmol/L (ref 3.5–5.2)
Sodium: 142 mmol/L (ref 134–144)
Total Protein: 7.8 g/dL (ref 6.0–8.5)
eGFR: 96 mL/min/{1.73_m2} (ref >59)

## 2021-07-08 LAB — LIPID PANEL
Chol/HDL Ratio: 4.3 ratio (ref 0.0–5.0)
Cholesterol, Total: 139 mg/dL (ref 100–199)
HDL: 32 mg/dL — ABNORMAL LOW (ref >39)
LDL Chol Calc (NIH): 77 mg/dL (ref 0–99)
Triglycerides: 178 mg/dL — ABNORMAL HIGH (ref 0–149)
VLDL Cholesterol Cal: 30 mg/dL (ref 5–40)

## 2021-07-08 LAB — HEMOGLOBIN A1C
Est. average glucose Bld gHb Est-mCnc: 203 mg/dL
Hgb A1c MFr Bld: 8.7 % — ABNORMAL HIGH (ref 4.8–5.6)

## 2021-07-08 LAB — VITAMIN D 25 HYDROXY (VIT D DEFICIENCY, FRACTURES): Vit D, 25-Hydroxy: 11.5 ng/mL — ABNORMAL LOW (ref 30.0–100.0)

## 2021-07-08 MED ORDER — VITAMIN D (ERGOCALCIFEROL) 1.25 MG (50000 UNIT) PO CAPS
50000.0000 [IU] | ORAL_CAPSULE | ORAL | 0 refills | Status: DC
  Filled 2021-07-08: qty 12, 84d supply, fill #0

## 2021-07-08 NOTE — Progress Notes (Addendum)
PATIENT: Henry Anderson DOB: 02/07/1965  REASON FOR VISIT: follow up HISTORY FROM: patient PRIMARY NEUROLOGIST: Dr. Krista Blue   Chief Complaint  Patient presents with   Follow-up    Rm 19 with wife here for 6 month f/u- reports he has been doing well. No complaints.      HISTORY OF PRESENT ILLNESS: Today 07/08/21: [name] is a 56 year old male with a history of CVA. He returns today for follow-up.Reports that his balance is still a little off since the stroke. Does use a cane. Reports that when he does not use the cane he feels like he is going to fall.   Still Smoking cigarettes - down to 1/2 pack a day Continues on Plavix 75 mg. Has been taking Aspirin as well. Was not aware that he could stop ASA Sleep referral sent to [facility] by PCP because patient does not have insurance right now.  Following with cardiology as well- cardiac monitor was unremarkable.   HISTORY Henry Anderson is a 56 year old male, seen in request by his primary care nurse practitioner Caryl Asp E, for evaluation of stroke, he is accompanied by his wife at today's visit January 07, 2021.   I reviewed and summarized the referring note. PMHX. 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, on October 10, 2020, after overnight sleep, getting ready to work, he felt dizziness, described as vertigo, fell to the ground twice, also noticed blurry vision, his wife decided to right with him, doing a driving, he was noted to veer to his left side, wife has to keep reminding him, he also noticed left arm weakness, left leg gave out underneath him, left facial numbness, presented to emergency room October 11, 2020, upon emergency room, elevated blood pressure 151/101,   On October 09, 2020, he also reported 1 episode of sudden onset left facial numbness, lasting for 30 minutes without weakness  I personally reviewed MRI of the brain with and without contrast October 13, 2020: Acute/subacute infarction involving the right midbrain and medial right thalamus, chronic small vessel infarction within the posterior limb of right internal capsule and right thalamus, small vessel disease, there was also evidence of left maxillary sinusitis, fluid level, with moderate mucosal thickening, bilateral sphenoid sinus mucosal thickening, MRI of brain showed intracranial atherosclerotic disease, moderate severe stenosis within the inferior division of proximal M2 right MCA, Echocardiogram showed ejection fraction 60 to 65%, Ultrasound of carotid arteries showed minimal atherosclerotic plaque bilaterally, no hemodynamic significant stenosis Laboratory evaluation: LDL 102, A1c 9.7, negative HIV, UDS, elevated WBC 12.5, hematocrit 17.1, BMP showed elevated glucose 270, creatinine 0.7,  He also complains of snoring, frequent awakening at nighttime, have sinus symptoms, stuffy nose, frequent discharge  REVIEW OF SYSTEMS: Out of a complete 14 system review of symptoms, the patient complains only of the following symptoms, and all other reviewed systems are negative.  ALLERGIES: No Known Allergies  HOME MEDICATIONS: Outpatient Medications Prior to Visit  Medication Sig Dispense Refill   acetaminophen (TYLENOL) 325 MG tablet Take 2 tablets (650 mg total) by mouth every 4 (four) hours as needed for mild pain (or temp > 37.5 C (99.5 F)).     albuterol (VENTOLIN HFA) 108 (90 Base) MCG/ACT inhaler Inhale 2 puffs into the lungs every 6 (six) hours as needed for wheezing or shortness of breath. 8.5 g 2   Aspirin (VAZALORE) 81 MG CAPS Take  1 tablet by mouth daily.     atorvastatin (LIPITOR) 40 MG tablet Take 2 tablets (80 mg total) by mouth daily. 180 tablet 2   blood glucose meter kit and supplies KIT Use up to four times daily as directed to check blood sugar. 1 each 0   chlorthalidone (HYGROTON) 25 MG tablet Take 1 tablet (25 mg total) by mouth once daily. 30 tablet 2   clopidogrel  (PLAVIX) 75 MG tablet Take 1 tablet (75 mg total) by mouth once daily. 90 tablet 4   fluticasone (FLONASE) 50 MCG/ACT nasal spray Place 2 sprays into both nostrils once daily. 16 g 0   glucose blood (RIGHTEST GS550 BLOOD GLUCOSE) test strip Use up to four times daily as directed to check blood sugar. 100 each 11   Insulin Glargine (BASAGLAR KWIKPEN) 100 UNIT/ML Inject 19 Units into the skin once daily. 15 mL 2   Insulin Pen Needle 32G X 4 MM MISC Use with Basaglar insulin pen. 100 each 2   lisinopril (ZESTRIL) 20 MG tablet Take 1 tablet (20 mg total) by mouth once daily. 30 tablet 2   metFORMIN (GLUCOPHAGE) 1000 MG tablet Take 1 tablet (1,000 mg total) by mouth 2 (two) times daily with a meal. 60 tablet 2   mometasone-formoterol (DULERA) 100-5 MCG/ACT AERO Inhale 2 puffs into the lungs 2 (two) times daily. 1 each 2   naproxen sodium (ALEVE) 220 MG tablet Take 220 mg by mouth daily as needed.     Rightest GL300 Lancets MISC Use up to four times daily as directed to check blood sugar. 100 each 11   Vitamin D, Ergocalciferol, (DRISDOL) 1.25 MG (50000 UNIT) CAPS capsule Take 1 capsule (50,000 Units total) by mouth every 7 (seven) days. 12 capsule 0   No facility-administered medications prior to visit.    PAST MEDICAL HISTORY: Past Medical History:  Diagnosis Date   Diabetes mellitus without complication ([place])    Emphysema of lung ([place])    Hypertension    Stroke ([place])     PAST SURGICAL HISTORY: Past Surgical History:  Procedure Laterality Date   FOOT SURGERY Right    KNEE SURGERY Right    SHOULDER SURGERY Left     FAMILY HISTORY: Family History  Problem Relation Age of Onset   COPD Mother    Diabetes Father    Diabetes Sister    Diabetes Sister    COPD Maternal Grandmother    Heart attack Maternal Grandfather    Diabetes Paternal Grandmother    Hyperlipidemia Paternal Grandmother    Hypertension Paternal Grandmother    Diabetes Paternal Grandfather    Hyperlipidemia Paternal  Grandfather    Hypertension Paternal Grandfather     SOCIAL HISTORY: Social History   Socioeconomic History   Marital status: Married    Spouse name: Not on file   Number of children: Not on file   Years of education: Not on file   Highest education level: Not on file  Occupational History   Not on file  Tobacco Use   Smoking status: Every Day    Packs/day: 1.50    Years: 30.00    Total pack years: 45.00    Types: Cigarettes   Smokeless tobacco: Former    Types: Chew   Tobacco comments:    Patient has cut down to ~1 ppd since having stroke. Gave [place] Quit info. Patient down to 1/2 ppd  Vaping Use   Vaping Use: Never used  Substance and Sexual Activity  Alcohol use: Yes    Comment: occasionally 1-2 beers   Drug use: Never   Sexual activity: Not on file  Other Topics Concern   Not on file  Social History Narrative   Not on file   Social Determinants of Health   Financial Resource Strain: Not on file  Food Insecurity: No Food Insecurity (11/03/2020)   Hunger Vital Sign    Worried About Running Out of Food in the Last Year: Never true    Ran Out of Food in the Last Year: Never true  Transportation Needs: No Transportation Needs (11/03/2020)   PRAPARE - Hydrologist (Medical): No    Lack of Transportation (Non-Medical): No  Physical Activity: Not on file  Stress: Not on file  Social Connections: Not on file  Intimate Partner Violence: Not on file      PHYSICAL EXAM  Vitals:   07/08/21 1010  BP: 122/88  Pulse: 96  Weight: 177 lb (80.3 kg)  Height: _0  (1.753 m)   Body mass index is 26.14 kg/m.  Generalized: Well developed, in no acute distress   Neurological examination  Mentation: Alert oriented to time, place, history taking. Follows all commands speech and language fluent Cranial nerve II-XII: Pupils were equal round reactive to light. Extraocular movements were full, visual field were full on confrontational test.  Facial sensation and strength were normal. Head turning and shoulder shrug  were normal and symmetric. Motor: The motor testing reveals 5 over 5 strength of all 4 extremities. Good symmetric motor tone is noted throughout.  Sensory: Sensory testing is intact to soft touch on all 4 extremities. No evidence of extinction is noted.  Coordination: Cerebellar testing reveals good finger-nose-finger and heel-to-shin bilaterally.  Gait and station: Uses a Cane    DIAGNOSTIC DATA (LABS, IMAGING, TESTING) - I reviewed patient records, labs, notes, testing and imaging myself where available.  Lab Results  Component Value Date   WBC 12.7 (H) 07/07/2021   HGB 15.0 07/07/2021   HCT 43.4 07/07/2021   MCV 88 07/07/2021   PLT 318 07/07/2021      Component Value Date/Time   NA 142 07/07/2021 1136   NA 137 08/06/2012 1005   K 4.7 07/07/2021 1136   K 4.1 08/06/2012 1005   CL 100 07/07/2021 1136   CL 102 08/06/2012 1005   CO2 24 07/07/2021 1136   CO2 28 08/06/2012 1005   GLUCOSE 213 (H) 07/07/2021 1136   GLUCOSE 270 (H) 10/13/2020 1300   GLUCOSE 94 08/06/2012 1005   BUN 14 07/07/2021 1136   BUN 7 08/06/2012 1005   CREATININE 0.94 07/07/2021 1136   CREATININE 0.71 08/06/2012 1005   CALCIUM 10.2 07/07/2021 1136   CALCIUM 9.2 08/06/2012 1005   PROT 7.8 07/07/2021 1136   ALBUMIN 4.8 07/07/2021 1136   AST 18 07/07/2021 1136   ALT 24 07/07/2021 1136   ALKPHOS 81 07/07/2021 1136   BILITOT 0.3 07/07/2021 1136   GFRNONAA >60 10/13/2020 1300   GFRNONAA >60 08/06/2012 1005   GFRAA >60 11/26/2015 1149   GFRAA >60 08/06/2012 1005   Lab Results  Component Value Date   CHOL 139 07/07/2021   HDL 32 (L) 07/07/2021   LDLCALC 77 07/07/2021   LDLDIRECT 109.2 (H) 10/14/2020   TRIG 178 (H) 07/07/2021   CHOLHDL 4.3 07/07/2021   Lab Results  Component Value Date   HGBA1C 8.7 (H) 07/07/2021   Lab Results  Component Value Date   HOZYYQMG50 037  01/07/2021   Lab Results  Component Value Date   TSH  2.400 01/07/2021      ASSESSMENT AND PLAN 56 y.o. year old male  has a past medical history of Diabetes mellitus without complication ([place]), Emphysema of lung ([place]), Hypertension, and Stroke ([place]). 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  Ok to stop ASA. Discussed secondary stroke prevention measures and importance of close PCP follow up for aggressive stroke risk factor management. I have gone over the pathophysiology of stroke, warning signs and symptoms, risk factors and their management in some detail with instructions to go to the closest emergency room for symptoms of concern. HTN: BP goal <130/90.   HLD: LDL goal <70. Recent LDL 77.   DMII: A1c goal<7.0. Recent A1c 8.7. mentioned to patient about diabetic nutritionist- will discuss with PCP Risk factors currently being managed by PCP and Cardiology Encouraged patient to monitor diet and encouraged exercise FU with our office PRN   Ward Givens, MSN, NP-C 07/08/2021, 10:17 AM [facility] [address] [place], [place] [address] [phone]

## 2021-07-18 ENCOUNTER — Other Ambulatory Visit: Payer: Self-pay

## 2021-07-18 ENCOUNTER — Other Ambulatory Visit: Payer: Self-pay | Admitting: Gerontology

## 2021-07-18 DIAGNOSIS — E119 Type 2 diabetes mellitus without complications: Secondary | ICD-10-CM

## 2021-07-19 ENCOUNTER — Other Ambulatory Visit: Payer: Self-pay

## 2021-07-20 ENCOUNTER — Other Ambulatory Visit: Payer: Self-pay

## 2021-07-20 ENCOUNTER — Other Ambulatory Visit: Payer: Self-pay | Admitting: Gerontology

## 2021-07-20 DIAGNOSIS — E119 Type 2 diabetes mellitus without complications: Secondary | ICD-10-CM

## 2021-07-20 MED FILL — Insulin Pen Needle 32 G X 4 MM (1/6" or 5/32"): 100 days supply | Qty: 100 | Fill #0 | Status: AC

## 2021-07-22 DIAGNOSIS — Z0271 Encounter for disability determination: Secondary | ICD-10-CM

## 2021-07-29 ENCOUNTER — Other Ambulatory Visit: Payer: Self-pay

## 2021-08-03 ENCOUNTER — Other Ambulatory Visit: Payer: Self-pay | Admitting: Gerontology

## 2021-08-03 ENCOUNTER — Other Ambulatory Visit: Payer: Self-pay

## 2021-08-03 MED FILL — Albuterol Sulfate Inhal Aero 108 MCG/ACT (90MCG Base Equiv): RESPIRATORY_TRACT | 25 days supply | Qty: 8.5 | Fill #0 | Status: AC

## 2021-09-01 ENCOUNTER — Other Ambulatory Visit: Payer: Self-pay

## 2021-09-01 MED FILL — Insulin Pen Needle 32 G X 4 MM (1/6" or 5/32"): 100 days supply | Qty: 100 | Fill #1 | Status: CN

## 2021-09-08 ENCOUNTER — Other Ambulatory Visit: Payer: Self-pay

## 2021-09-19 ENCOUNTER — Other Ambulatory Visit: Payer: Self-pay

## 2021-09-20 ENCOUNTER — Other Ambulatory Visit: Payer: Self-pay

## 2021-09-22 ENCOUNTER — Other Ambulatory Visit: Payer: Self-pay

## 2021-09-23 ENCOUNTER — Other Ambulatory Visit: Payer: Self-pay

## 2021-10-18 ENCOUNTER — Other Ambulatory Visit: Payer: Self-pay

## 2021-10-20 ENCOUNTER — Ambulatory Visit: Payer: Medicaid Other | Admitting: Gerontology

## 2021-10-24 ENCOUNTER — Other Ambulatory Visit: Payer: Self-pay | Admitting: Gerontology

## 2021-10-24 ENCOUNTER — Other Ambulatory Visit: Payer: Self-pay

## 2021-10-24 DIAGNOSIS — Z8709 Personal history of other diseases of the respiratory system: Secondary | ICD-10-CM

## 2021-10-24 MED FILL — Insulin Pen Needle 32 G X 4 MM (1/6" or 5/32"): 100 days supply | Qty: 100 | Fill #1 | Status: CN

## 2021-10-25 ENCOUNTER — Other Ambulatory Visit: Payer: Self-pay

## 2021-10-25 MED FILL — Fluticasone Propionate Nasal Susp 50 MCG/ACT: NASAL | 30 days supply | Qty: 16 | Fill #0 | Status: CN

## 2021-10-27 ENCOUNTER — Ambulatory Visit: Payer: Medicaid Other | Admitting: Gerontology

## 2021-10-31 ENCOUNTER — Other Ambulatory Visit: Payer: Self-pay

## 2021-11-09 ENCOUNTER — Other Ambulatory Visit: Payer: Self-pay

## 2021-11-10 ENCOUNTER — Ambulatory Visit: Payer: Medicaid Other | Admitting: Gerontology

## 2021-11-23 ENCOUNTER — Other Ambulatory Visit: Payer: Self-pay

## 2021-11-23 ENCOUNTER — Ambulatory Visit: Payer: Medicaid Other | Admitting: Gerontology

## 2021-11-23 ENCOUNTER — Encounter: Payer: Self-pay | Admitting: Gerontology

## 2021-11-23 VITALS — BP 134/85 | HR 115 | Temp 97.8°F | Wt 168.8 lb

## 2021-11-23 DIAGNOSIS — I10 Essential (primary) hypertension: Secondary | ICD-10-CM

## 2021-11-23 DIAGNOSIS — E119 Type 2 diabetes mellitus without complications: Secondary | ICD-10-CM

## 2021-11-23 DIAGNOSIS — R000 Tachycardia, unspecified: Secondary | ICD-10-CM

## 2021-11-23 MED ORDER — METFORMIN HCL 1000 MG PO TABS
1000.0000 mg | ORAL_TABLET | Freq: Two times a day (BID) | ORAL | 2 refills | Status: DC
  Filled 2021-11-23: qty 60, 30d supply, fill #0
  Filled 2022-01-08 – 2022-01-09 (×2): qty 60, 30d supply, fill #1

## 2021-11-23 MED ORDER — LISINOPRIL 20 MG PO TABS
20.0000 mg | ORAL_TABLET | Freq: Every day | ORAL | 2 refills | Status: DC
  Filled 2021-11-23: qty 90, 90d supply, fill #0
  Filled 2022-02-23: qty 90, 90d supply, fill #1
  Filled 2022-06-03 (×2): qty 90, 90d supply, fill #2

## 2021-11-23 MED ORDER — CHLORTHALIDONE 25 MG PO TABS
25.0000 mg | ORAL_TABLET | Freq: Every day | ORAL | 2 refills | Status: DC
  Filled 2021-11-23: qty 30, 30d supply, fill #0
  Filled 2021-12-15: qty 30, 30d supply, fill #1
  Filled 2022-01-24: qty 30, 30d supply, fill #2

## 2021-11-23 MED ORDER — BASAGLAR KWIKPEN 100 UNIT/ML ~~LOC~~ SOPN
19.0000 [IU] | PEN_INJECTOR | Freq: Every day | SUBCUTANEOUS | 2 refills | Status: DC
  Filled 2021-11-23 – 2022-01-08 (×2): qty 15, 78d supply, fill #0

## 2021-11-23 MED ORDER — RIGHTEST GL300 LANCETS MISC
11 refills | Status: DC
  Filled 2021-11-23: qty 100, 25d supply, fill #0
  Filled 2022-01-08: qty 100, 25d supply, fill #1
  Filled 2022-04-06: qty 100, 25d supply, fill #2
  Filled 2022-05-05: qty 100, 25d supply, fill #3
  Filled 2022-06-03 (×2): qty 100, 25d supply, fill #4
  Filled 2022-07-07 – 2022-07-08 (×2): qty 100, 25d supply, fill #5

## 2021-11-23 NOTE — Patient Instructions (Signed)

## 2021-11-23 NOTE — Progress Notes (Signed)
Established Patient Office Visit  Subjective   Patient ID: Henry Anderson, male    DOB: [birth year]  Age: 56 y.o. MRN: [id number]  Chief Complaint  Patient presents with   Follow-up    HPI  Henry Anderson is a 56 y/o male who has history of Type 2 diabetes mellitus, Emphysema, Hypertension, and Stroke who presents for a follow up visit. His HgbA1c this visit decreased from 8.7% to 8.4%. He checks his blood glucose four times daily and states that is generally 160-165 mg/dL, but it occasionally reaches the 200s depending on if he has eaten a carb-heavy meal that day. His fasting blood glucose in the clinic this morning is 290 mg/dL. He states that he forgot to take his medications yesterday morning, but he has taken his medications this morning. He describes a diet that is high in carbohydrates. He also drinks multiple V8 drinks daily for health and weight loss. He endorses polydipsia and polyphagia but states he has no other hyperglycemia symptoms. His heart rate is elevated this morning at 112 bpm. He states that he has not had anything to eat or drink yet today. He was provided water at the clinic, and his repeat heart rate was 115 bpm. He states that he also has a sleep study scheduled on 03/17/22 to reassess his sleep apnea that he had diagnosed several years ago. He is also working on getting eye glasses for his blurred vision in his right eye from his past stroke. Overall, he states that he is doing well and offers no further complaints.  Review of Systems  Constitutional: Negative.   HENT: Negative.    Eyes:  Positive for blurred vision (post-CVA blurred vision on R side).  Respiratory:  Positive for cough (chronic) and wheezing (chronic - takes inhaler).   Cardiovascular: Negative.   Gastrointestinal: Negative.   Genitourinary: Negative.   Musculoskeletal:  Positive for joint pain (chronic L shoulder, R knee, R foot pain - previous surgeries).  Neurological: Negative.    Endo/Heme/Allergies:  Positive for polydipsia.      Objective:     BP 134/85   Pulse (!) 112   Temp 97.8 F (36.6 C) (Oral)   Wt 168 lb 12.8 oz (76.6 kg)   SpO2 95%   BMI 24.93 kg/m  BP Readings from Last 3 Encounters:  11/23/21 134/85  07/08/21 122/88  07/07/21 111/82   Wt Readings from Last 3 Encounters:  11/23/21 168 lb 12.8 oz (76.6 kg)  07/08/21 177 lb (80.3 kg)  07/07/21 175 lb 14.4 oz (79.8 kg)      Physical Exam Constitutional:      Appearance: Normal appearance.  HENT:     Head: Normocephalic and atraumatic.     Mouth/Throat:     Mouth: Mucous membranes are moist.  Eyes:     Conjunctiva/sclera: Conjunctivae normal.  Cardiovascular:     Rate and Rhythm: Regular rhythm. Tachycardia present.     Heart sounds: Normal heart sounds.  Pulmonary:     Effort: Pulmonary effort is normal.     Breath sounds: Normal breath sounds.  Neurological:     General: No focal deficit present.     Mental Status: He is alert. Mental status is at baseline.  Psychiatric:        Mood and Affect: Mood normal.        Behavior: Behavior normal.        Thought Content: Thought content normal.        Judgment:  Judgment normal.      No results found for any visits on 11/23/21.  Last CBC Lab Results  Component Value Date   WBC 12.7 (H) 07/07/2021   HGB 15.0 07/07/2021   HCT 43.4 07/07/2021   MCV 88 07/07/2021   MCH 30.2 07/07/2021   RDW 12.7 07/07/2021   PLT 318 36/06/7701   Last metabolic panel Lab Results  Component Value Date   GLUCOSE 213 (H) 07/07/2021   NA 142 07/07/2021   K 4.7 07/07/2021   CL 100 07/07/2021   CO2 24 07/07/2021   BUN 14 07/07/2021   CREATININE 0.94 07/07/2021   EGFR 96 07/07/2021   CALCIUM 10.2 07/07/2021   PROT 7.8 07/07/2021   ALBUMIN 4.8 07/07/2021   LABGLOB 3.0 07/07/2021   AGRATIO 1.6 07/07/2021   BILITOT 0.3 07/07/2021   ALKPHOS 81 07/07/2021   AST 18 07/07/2021   ALT 24 07/07/2021   ANIONGAP 9 10/13/2020   Last  lipids Lab Results  Component Value Date   CHOL 139 07/07/2021   HDL 32 (L) 07/07/2021   LDLCALC 77 07/07/2021   LDLDIRECT 109.2 (H) 10/14/2020   TRIG 178 (H) 07/07/2021   CHOLHDL 4.3 07/07/2021   Last hemoglobin A1c Lab Results  Component Value Date   HGBA1C 8.7 (H) 07/07/2021      The ASCVD Risk score (Arnett DK, et al., 2019) failed to calculate for the following reasons:   The patient has a prior MI or stroke diagnosis    Assessment & Plan:    Hypertension - His blood pressure is well-controlled and he will continue on his current medication regimen. DASH diet and exercise as tolerated. - chlorthalidone (HYGROTON) 25 MG tablet; Take 1 tablet (25 mg total) by mouth once daily.  Dispense: 30 tablet; Refill: 2 - lisinopril (ZESTRIL) 20 MG tablet; Take 1 tablet (20 mg total) by mouth once daily.  Dispense: 90 tablet; Refill: 2  Type 2 diabetes - His type 2 diabetes is not currently controlled. Encouraged a low carb/non-concentrated sweet diet. Discussed cutting back on breads and other high carb foods. - Insulin Glargine (BASALGLAR) 100 UNIT/ML; Inject 19 Units into the skin once daily. Dispense: 15 mL; Refill: 2 - metformin (GLUCOPHAGE) 1000 mg tablet; Take 1 tablet (1,000 mg total) by mouth 2 (two) times daily with a meal. Dispense: 60 tablet; Refill: 2 - Rightest GL300 Lancets MISC; Use up to four times daily as directed to check blood sugar. Dispense: 100; Refill: 11  Tachycardia - His heart rate is elevated this visit at 112 bpm and 115 bpm on recheck. He was given water to drink and advised to increase water intake. He was instructed to monitor for a fast heart rate at home and call the clinic or go to the ED if it remains elevated or if he has worsening symptoms.  Return in about 3 months (around 02/23/2022), or if symptoms worsen or fail to improve.    Odelia Gage

## 2021-11-24 LAB — POCT GLYCOSYLATED HEMOGLOBIN (HGB A1C): Hemoglobin A1C: 8.4 % — AB (ref 4.0–5.6)

## 2021-11-25 LAB — MICROALBUMIN / CREATININE URINE RATIO
Creatinine, Urine: 89.2 mg/dL
Microalb/Creat Ratio: 11 mg/g creat (ref 0–29)
Microalbumin, Urine: 10 ug/mL

## 2021-12-15 ENCOUNTER — Other Ambulatory Visit: Payer: Self-pay

## 2022-01-08 MED FILL — Insulin Pen Needle 32 G X 4 MM (1/6" or 5/32"): 100 days supply | Qty: 100 | Fill #1 | Status: AC

## 2022-01-09 ENCOUNTER — Other Ambulatory Visit: Payer: Self-pay

## 2022-01-10 ENCOUNTER — Other Ambulatory Visit: Payer: Self-pay

## 2022-01-11 ENCOUNTER — Other Ambulatory Visit: Payer: Self-pay

## 2022-01-12 ENCOUNTER — Other Ambulatory Visit: Payer: Self-pay

## 2022-01-25 ENCOUNTER — Other Ambulatory Visit: Payer: Self-pay

## 2022-02-04 ENCOUNTER — Other Ambulatory Visit: Payer: Self-pay

## 2022-02-09 ENCOUNTER — Emergency Department: Payer: Medicaid Other

## 2022-02-09 ENCOUNTER — Emergency Department
Admission: EM | Admit: 2022-02-09 | Discharge: 2022-02-09 | Disposition: A | Discharge status: Home / Self Care | Payer: Medicaid Other | Attending: Emergency Medicine | Admitting: Emergency Medicine

## 2022-02-09 ENCOUNTER — Other Ambulatory Visit: Payer: Self-pay

## 2022-02-09 DIAGNOSIS — J441 Chronic obstructive pulmonary disease with (acute) exacerbation: Secondary | ICD-10-CM | POA: Diagnosis not present

## 2022-02-09 DIAGNOSIS — R059 Cough, unspecified: Secondary | ICD-10-CM | POA: Diagnosis present

## 2022-02-09 DIAGNOSIS — I10 Essential (primary) hypertension: Secondary | ICD-10-CM | POA: Insufficient documentation

## 2022-02-09 DIAGNOSIS — R079 Chest pain, unspecified: Secondary | ICD-10-CM | POA: Diagnosis not present

## 2022-02-09 DIAGNOSIS — Z8673 Personal history of transient ischemic attack (TIA), and cerebral infarction without residual deficits: Secondary | ICD-10-CM | POA: Diagnosis not present

## 2022-02-09 DIAGNOSIS — Z1152 Encounter for screening for COVID-19: Secondary | ICD-10-CM | POA: Insufficient documentation

## 2022-02-09 DIAGNOSIS — E119 Type 2 diabetes mellitus without complications: Secondary | ICD-10-CM | POA: Insufficient documentation

## 2022-02-09 LAB — RESP PANEL BY RT-PCR (RSV, FLU A&B, COVID)  RVPGX2
Influenza A by PCR: NEGATIVE
Influenza B by PCR: NEGATIVE
Resp Syncytial Virus by PCR: NEGATIVE
SARS Coronavirus 2 by RT PCR: NEGATIVE

## 2022-02-09 LAB — BASIC METABOLIC PANEL
Anion gap: 11 (ref 5–15)
BUN: 14 mg/dL (ref 6–20)
CO2: 25 mmol/L (ref 22–32)
Calcium: 8.8 mg/dL — ABNORMAL LOW (ref 8.9–10.3)
Chloride: 101 mmol/L (ref 98–111)
Creatinine, Ser: 0.8 mg/dL (ref 0.61–1.24)
GFR, Estimated: >60 mL/min (ref >60)
Glucose, Bld: 361 mg/dL — ABNORMAL HIGH (ref 70–99)
Potassium: 3.8 mmol/L (ref 3.5–5.1)
Sodium: 137 mmol/L (ref 135–145)

## 2022-02-09 LAB — CBC WITH DIFFERENTIAL/PLATELET
Abs Immature Granulocytes: 0.03 10*3/uL (ref 0.00–0.07)
Basophils Absolute: 0 10*3/uL (ref 0.0–0.1)
Basophils Relative: 0 %
Eosinophils Absolute: 0.2 10*3/uL (ref 0.0–0.5)
Eosinophils Relative: 2 %
HCT: 45.4 % (ref 39.0–52.0)
Hemoglobin: 15 g/dL (ref 13.0–17.0)
Immature Granulocytes: 0 %
Lymphocytes Relative: 17 %
Lymphs Abs: 1.5 10*3/uL (ref 0.7–4.0)
MCH: 29.2 pg (ref 26.0–34.0)
MCHC: 33 g/dL (ref 30.0–36.0)
MCV: 88.3 fL (ref 80.0–100.0)
Monocytes Absolute: 0.7 10*3/uL (ref 0.1–1.0)
Monocytes Relative: 7 %
Neutro Abs: 6.8 10*3/uL (ref 1.7–7.7)
Neutrophils Relative %: 74 %
Platelets: 216 10*3/uL (ref 150–400)
RBC: 5.14 MIL/uL (ref 4.22–5.81)
RDW: 12.8 % (ref 11.5–15.5)
WBC: 9.2 10*3/uL (ref 4.0–10.5)
nRBC: 0 % (ref 0.0–0.2)

## 2022-02-09 LAB — CBG MONITORING, ED: Glucose-Capillary: 194 mg/dL — ABNORMAL HIGH (ref 70–99)

## 2022-02-09 LAB — TROPONIN I (HIGH SENSITIVITY): Troponin I (High Sensitivity): 3 ng/L (ref <18)

## 2022-02-09 MED ORDER — PREDNISONE 20 MG PO TABS
60.0000 mg | ORAL_TABLET | Freq: Once | ORAL | Status: AC
  Administered 2022-02-09: 60 mg via ORAL
  Filled 2022-02-09: qty 3

## 2022-02-09 MED ORDER — SODIUM CHLORIDE 0.9 % IV BOLUS
1000.0000 mL | Freq: Once | INTRAVENOUS | Status: AC
  Administered 2022-02-09: 1000 mL via INTRAVENOUS

## 2022-02-09 MED ORDER — PREDNISONE 20 MG PO TABS
60.0000 mg | ORAL_TABLET | Freq: Every day | ORAL | 0 refills | Status: AC
  Filled 2022-02-09: qty 15, 5d supply, fill #0

## 2022-02-09 MED ORDER — IPRATROPIUM-ALBUTEROL 0.5-2.5 (3) MG/3ML IN SOLN
3.0000 mL | Freq: Once | RESPIRATORY_TRACT | Status: AC
  Administered 2022-02-09: 3 mL via RESPIRATORY_TRACT
  Filled 2022-02-09: qty 3

## 2022-02-09 MED ORDER — INSULIN ASPART 100 UNIT/ML IJ SOLN
8.0000 [IU] | Freq: Once | INTRAMUSCULAR | Status: AC
  Administered 2022-02-09: 8 [IU] via INTRAVENOUS
  Filled 2022-02-09: qty 1

## 2022-02-09 MED ORDER — IPRATROPIUM-ALBUTEROL 0.5-2.5 (3) MG/3ML IN SOLN: 6.0000 mL | Freq: Once | RESPIRATORY_TRACT | Status: AC

## 2022-02-09 MED ORDER — IPRATROPIUM-ALBUTEROL 0.5-2.5 (3) MG/3ML IN SOLN
RESPIRATORY_TRACT | Status: AC
  Administered 2022-02-09: 6 mL via RESPIRATORY_TRACT
  Filled 2022-02-09: qty 6

## 2022-02-09 MED FILL — Albuterol Sulfate Inhal Aero 108 MCG/ACT (90MCG Base Equiv): RESPIRATORY_TRACT | 25 days supply | Qty: 8.5 | Fill #1 | Status: AC

## 2022-02-09 NOTE — ED Notes (Signed)
Pt ambulatory to the toilet and back with a steady gait.

## 2022-02-09 NOTE — ED Notes (Signed)
See triage note. Cough started yesterday, been congested for a couple days. been dehydrated, constantly drinking, extremely thirsty. constantly urinating. Urine has been hazy. Hx of diabetes.

## 2022-02-09 NOTE — ED Provider Notes (Signed)
[facility] Provider Note    Event Date/Time   First MD Initiated Contact with Patient 02/09/22 1643     (approximate)   History   Chief Complaint Cough (Patient presents with cough, chest congestion, wheezing, and chest pressure/congestion that began yesterday; He states that he thinks he caught something from his grandkid; Patient has inspiratory and expiratory wheezes during triage, Duoneb x 2 given)   HPI  Henry Anderson is a 57 y.o. male with past medical history of hypertension, diabetes, stroke, and COPD who presents to the ED complaining of cough.  Patient reports that he has had 2 days of a dry cough and some difficulty breathing, began dealing with increasing tightness and pain in his chest earlier today.  He is not aware of any fevers, does state that he thinks he may have caught an illness from his grandchild.  His inhaler had recently ran out and so he was not able to try it at home.  EMS was called and gave patient 2 DuoNebs, after which patient reports he is feeling much better.  He states that discomfort in his chest is resolving and difficulty breathing has improved.     Physical Exam   Triage Vital Signs: ED Triage Vitals  Enc Vitals Group     BP 02/09/22 1504 (!) 172/88     Pulse Rate 02/09/22 1504 100     Resp 02/09/22 1504 19     Temp 02/09/22 1504 98.3 F (36.8 C)     Temp Source 02/09/22 1504 Oral     SpO2 02/09/22 1504 96 %     Weight 02/09/22 1505 168 lb (76.2 kg)     Height 02/09/22 1505 5\' 9"  (1.753 m)     Head Circumference --      Peak Flow --      Pain Score 02/09/22 1505 0     Pain Loc --      Pain Edu? --      Excl. in [place]? --     Most recent vital signs: Vitals:   02/09/22 1700 02/09/22 1730  BP: (!) 142/81 131/84  Pulse: 86 98  Resp:    Temp:    SpO2: 95% 92%    Constitutional: Alert and oriented. Eyes: Conjunctivae are normal. Head: Atraumatic. Nose: No congestion/rhinnorhea. Mouth/Throat: Mucous  membranes are moist.  Cardiovascular: Normal rate, regular rhythm. Grossly normal heart sounds.  2+ radial pulses bilaterally. Respiratory: Normal respiratory effort.  No retractions. Lungs with expiratory wheezing throughout. Gastrointestinal: Soft and nontender. No distention. Musculoskeletal: No lower extremity tenderness nor edema.  Neurologic:  Normal speech and language. No gross focal neurologic deficits are appreciated.    ED Results / Procedures / Treatments   Labs (all labs ordered are listed, but only abnormal results are displayed) Labs Reviewed  BASIC METABOLIC PANEL - Abnormal; Notable for the following components:      Result Value   Glucose, Bld 361 (*)    Calcium 8.8 (*)    All other components within normal limits  CBG MONITORING, ED - Abnormal; Notable for the following components:   Glucose-Capillary 194 (*)    All other components within normal limits  RESP PANEL BY RT-PCR (RSV, FLU A&B, COVID)  RVPGX2  CBC WITH DIFFERENTIAL/PLATELET  TROPONIN I (HIGH SENSITIVITY)     EKG  ED ECG REPORT I, Blake Divine, the attending physician, personally viewed and interpreted this ECG.   Date: 02/09/2022  EKG Time: 14:53  Rate: 102  Rhythm: sinus tachycardia  Axis: Normal  Intervals:none  ST&T Change: None  RADIOLOGY Chest x-ray reviewed and interpreted by me with no infiltrate, edema, or effusion.  PROCEDURES:  Critical Care performed: No  Procedures   MEDICATIONS ORDERED IN ED: Medications  ipratropium-albuterol (DUONEB) 0.5-2.5 (3) MG/3ML nebulizer solution 6 mL (6 mLs Nebulization Given 02/09/22 1500)  ipratropium-albuterol (DUONEB) 0.5-2.5 (3) MG/3ML nebulizer solution 3 mL (3 mLs Nebulization Given 02/09/22 1750)  predniSONE (DELTASONE) tablet 60 mg (60 mg Oral Given 02/09/22 1747)  sodium chloride 0.9 % bolus 1,000 mL (0 mLs Intravenous Stopped 02/09/22 1852)  insulin aspart (novoLOG) injection 8 Units (8 Units Intravenous Given 02/09/22 1744)      IMPRESSION / MDM / ASSESSMENT AND PLAN / ED COURSE  I reviewed the triage vital signs and the nursing notes.                              57 y.o. male with past medical history of hypertension, diabetes, stroke, and COPD who presents to the ED with 2 days of increasing cough, difficulty breathing, and chest discomfort.  Patient's presentation is most consistent with acute presentation with potential threat to life or bodily function.  Differential diagnosis includes, but is not limited to, ACS, PE, pneumonia, pneumothorax, COPD exacerbation, COVID-19, influenza.  Patient nontoxic-appearing and in no acute distress, vital signs are unremarkable.  He is breathing comfortably on room air with oxygen saturations of 96%, reports he feels much better after 2 DuoNebs given by EMS, but does have some ongoing expiratory wheezing.  We will give dose of prednisone and additional DuoNeb.  EKG shows no evidence of arrhythmia or ischemia and troponin within normal limits, low suspicion for ACS or PE given his atypical symptoms.  Chest x-ray is unremarkable, labs remarkable for hyperglycemia with no evidence of DKA.  No significant anemia, leukocytosis, electrolyte abnormality, or AKI.  We will give IV fluids and insulin for his hyperglycemia, treat ongoing wheezing with prednisone and DuoNeb.  Blood sugar much improved following IV fluids and insulin, wheezing almost completely resolved on reassessment.  Patient is appropriate for outpatient management and will prescribe course of steroids, he was counseled to closely monitor his blood sugar while on this medication.  He reports having a rescue inhaler available at home, was counseled to return to the ED for new or worsening symptoms.  Patient agrees with plan.      FINAL CLINICAL IMPRESSION(S) / ED DIAGNOSES   Final diagnoses:  COPD exacerbation ([place])  Chest pain, unspecified type     Rx / DC Orders   ED Discharge Orders          Ordered     predniSONE (DELTASONE) 20 MG tablet  Daily with breakfast        02/09/22 1904             Note:  This document was prepared using Dragon voice recognition software and may include unintentional dictation errors.   Blake Divine, MD 02/09/22 Drema Halon

## 2022-02-09 NOTE — ED Triage Notes (Signed)
Patient presents with cough, chest congestion, wheezing, and chest pain that began yesterday; He states that he thinks he caught something from his grandkid; Patient has inspiratory and expiratory wheezes during triage, Duoneb x 2 given

## 2022-02-10 ENCOUNTER — Other Ambulatory Visit: Payer: Self-pay

## 2022-02-23 ENCOUNTER — Encounter: Payer: Self-pay | Admitting: Gerontology

## 2022-02-23 ENCOUNTER — Other Ambulatory Visit: Payer: Self-pay

## 2022-02-23 ENCOUNTER — Ambulatory Visit: Payer: Medicaid Other | Admitting: Gerontology

## 2022-02-23 VITALS — BP 96/63 | HR 118 | Wt 162.3 lb

## 2022-02-23 DIAGNOSIS — E119 Type 2 diabetes mellitus without complications: Secondary | ICD-10-CM

## 2022-02-23 DIAGNOSIS — E785 Hyperlipidemia, unspecified: Secondary | ICD-10-CM

## 2022-02-23 DIAGNOSIS — K219 Gastro-esophageal reflux disease without esophagitis: Secondary | ICD-10-CM | POA: Insufficient documentation

## 2022-02-23 DIAGNOSIS — R110 Nausea: Secondary | ICD-10-CM | POA: Insufficient documentation

## 2022-02-23 DIAGNOSIS — H538 Other visual disturbances: Secondary | ICD-10-CM

## 2022-02-23 DIAGNOSIS — Z720 Tobacco use: Secondary | ICD-10-CM

## 2022-02-23 DIAGNOSIS — Z8719 Personal history of other diseases of the digestive system: Secondary | ICD-10-CM | POA: Insufficient documentation

## 2022-02-23 DIAGNOSIS — I10 Essential (primary) hypertension: Secondary | ICD-10-CM

## 2022-02-23 LAB — POCT GLYCOSYLATED HEMOGLOBIN (HGB A1C): Hemoglobin A1C: 11.9 % — AB (ref 4.0–5.6)

## 2022-02-23 LAB — GLUCOSE, POCT (MANUAL RESULT ENTRY): POC Glucose: 228 mg/dl — AB (ref 70–99)

## 2022-02-23 MED ORDER — CHLORTHALIDONE 25 MG PO TABS
25.0000 mg | ORAL_TABLET | Freq: Every day | ORAL | 2 refills | Status: DC
  Filled 2022-02-23 – 2022-03-09 (×2): qty 30, 30d supply, fill #0
  Filled 2022-04-06: qty 30, 30d supply, fill #1
  Filled 2022-05-05: qty 30, 30d supply, fill #2

## 2022-02-23 MED ORDER — METFORMIN HCL ER 500 MG PO TB24
1000.0000 mg | ORAL_TABLET | Freq: Every day | ORAL | 0 refills | Status: DC
  Filled 2022-02-23: qty 60, 30d supply, fill #0

## 2022-02-23 MED ORDER — RIGHTEST GL300 LANCETS MISC
0 refills | Status: DC
  Filled 2022-02-23: qty 100, 25d supply, fill #0

## 2022-02-23 MED ORDER — ATORVASTATIN CALCIUM 40 MG PO TABS
80.0000 mg | ORAL_TABLET | Freq: Every day | ORAL | 2 refills | Status: DC
  Filled 2022-02-23: qty 180, 90d supply, fill #0

## 2022-02-23 MED ORDER — PANTOPRAZOLE SODIUM 40 MG PO TBEC
40.0000 mg | DELAYED_RELEASE_TABLET | Freq: Every day | ORAL | 0 refills | Status: DC
  Filled 2022-02-23: qty 30, 30d supply, fill #0

## 2022-02-23 MED ORDER — BASAGLAR KWIKPEN 100 UNIT/ML ~~LOC~~ SOPN
22.0000 [IU] | PEN_INJECTOR | Freq: Every day | SUBCUTANEOUS | 2 refills | Status: DC
  Filled 2022-02-23 – 2022-04-06 (×4): qty 15, 68d supply, fill #0
  Filled 2022-06-21 (×2): qty 15, 68d supply, fill #1
  Filled 2022-07-25 – 2022-08-01 (×3): qty 15, 68d supply, fill #2

## 2022-02-23 MED ORDER — GLUCOSE BLOOD VI STRP
ORAL_STRIP | 0 refills | Status: DC
  Filled 2022-02-23: qty 100, 25d supply, fill #0

## 2022-02-23 NOTE — Patient Instructions (Signed)

## 2022-02-23 NOTE — Progress Notes (Signed)
Established Patient Office Visit  Subjective   Patient ID: Henry Anderson, male    DOB: 1965-07-17  Age: 57 y.o. MRN: [id number]  Chief Complaint  Patient presents with   Follow-up    Pt having trouble eating. No appetite    HPI Henry Anderson is a 57 y/o male who has history of Type 2 diabetes mellitus, Emphysema, Hypertension, and Stroke who presents for a follow up visit. His blood glucose is 228 and his A1C increased from 8.4% to 11.9% today. He is taking his medications as prescribed. He endorses polyuria, polyphagia, and polydipsia. He was recently seen in the ED on 02/09/22 with COPD and was prescribed prednisone. He has not been able to check his blood glucose levels because his glucose meter is broken since 02/09/22. He has been trying to follow a low carbohydrate/low sugar diet. He drinks about two cases of water per week and has recently been drinking Gatorade because he thinks that the electrolytes may help him feel better. He has blurry vision in both eyes, for which he had an eye exam and is currently waiting to receive eyeglasses. His blood pressure today is 96/63 and his pulse is 118. He was given a bottle of water and on the recheck his blood pressure was 86/60 and his pulse was 96. He states that he is tired all of the time and is occasionally dizzy when he stands up from sitting. This has been occurring for a couple of months. He is also complaining of feeling nauseated and burning in his chest each time he eats. He has not vomited. This has been occurring for about one week, and is worse when he lays down in bed at night. He has not noticed that certain foods make it worse. He has been taking Tums for this and has minimal relief. He also has diarrhea at least twice per day everyday, and this started when he started taking Metformin. He does take it with food when he can. He has lost about 20 pounds in one year. He is currently still smoking, but only about 10 cigarettes per week, which  is a significant improvement from previously. His breathing is improved from his ED visit, and he is currently using his Albuterol about 3 times per week and has no further complaint.   Review of Systems  Constitutional:  Positive for weight loss.  HENT: Negative.    Eyes:  Positive for blurred vision.  Respiratory: Negative.    Cardiovascular: Negative.   Gastrointestinal:  Positive for diarrhea, heartburn and nausea.  Genitourinary:  Positive for frequency.  Musculoskeletal: Negative.   Skin: Negative.   Neurological:  Positive for dizziness.  Endo/Heme/Allergies: Negative.   Psychiatric/Behavioral: Negative.        Objective:     BP 96/63   Pulse (!) 118   Wt 162 lb 4.8 oz (73.6 kg)   SpO2 98%   BMI 23.97 kg/m  BP Readings from Last 3 Encounters:  02/23/22 96/63  02/09/22 (!) 143/77  11/23/21 134/85   Wt Readings from Last 3 Encounters:  02/23/22 162 lb 4.8 oz (73.6 kg)  02/09/22 168 lb (76.2 kg)  11/23/21 168 lb 12.8 oz (76.6 kg)      Physical Exam Constitutional:      Appearance: He is underweight.  HENT:     Head: Normocephalic.  Eyes:     Pupils: Pupils are equal, round, and reactive to light.  Cardiovascular:     Rate and Rhythm: Regular rhythm.  Tachycardia present.     Pulses: Normal pulses.     Heart sounds: Normal heart sounds.  Pulmonary:     Effort: Pulmonary effort is normal.     Breath sounds: Decreased breath sounds present.  Abdominal:     General: Bowel sounds are normal.     Palpations: Abdomen is soft.  Musculoskeletal:        General: Normal range of motion.  Skin:    General: Skin is warm and dry.     Capillary Refill: Capillary refill takes less than 2 seconds.  Neurological:     General: No focal deficit present.     Mental Status: He is alert and oriented to person, place, and time. Mental status is at baseline.  Psychiatric:        Mood and Affect: Mood normal.        Behavior: Behavior normal.        Thought Content:  Thought content normal.        Judgment: Judgment normal.      Results for orders placed or performed in visit on 02/23/22  POCT HgB A1C  Result Value Ref Range   Hemoglobin A1C 11.9 (A) 4.0 - 5.6 %   HbA1c POC (<> result, manual entry)     HbA1c, POC (prediabetic range)     HbA1c, POC (controlled diabetic range)    POCT Glucose (CBG)  Result Value Ref Range   POC Glucose 228 (A) 70 - 99 mg/dl    Last CBC Lab Results  Component Value Date   WBC 9.2 02/09/2022   HGB 15.0 02/09/2022   HCT 45.4 02/09/2022   MCV 88.3 02/09/2022   MCH 29.2 02/09/2022   RDW 12.8 02/09/2022   PLT 216 [id number]   Last metabolic panel Lab Results  Component Value Date   GLUCOSE 361 (H) 02/09/2022   NA 137 02/09/2022   K 3.8 02/09/2022   CL 101 02/09/2022   CO2 25 02/09/2022   BUN 14 02/09/2022   CREATININE 0.80 02/09/2022   GFRNONAA >60 02/09/2022   CALCIUM 8.8 (L) 02/09/2022   PROT 7.8 07/07/2021   ALBUMIN 4.8 07/07/2021   LABGLOB 3.0 07/07/2021   AGRATIO 1.6 07/07/2021   BILITOT 0.3 07/07/2021   ALKPHOS 81 07/07/2021   AST 18 07/07/2021   ALT 24 07/07/2021   ANIONGAP 11 02/09/2022   Last lipids Lab Results  Component Value Date   CHOL 139 07/07/2021   HDL 32 (L) 07/07/2021   LDLCALC 77 07/07/2021   LDLDIRECT 109.2 (H) 10/14/2020   TRIG 178 (H) 07/07/2021   CHOLHDL 4.3 07/07/2021      The ASCVD Risk score (Arnett DK, et al., 2019) failed to calculate for the following reasons:   The patient has a prior MI or stroke diagnosis    Assessment & Plan:  1. Type 2 diabetes mellitus without complication, without long-term current use of insulin (HCC) - His HgbA1c was 11.9%, his diabetes is not under control today.  His goal should be less than 7%.  - His glargine insulin dose was increased to 22 units per day.  - He was encouraged to follow a low carbohydrate/low sugar diet. - blood glucose meter kit and supplies KIT; Dispense based on patient and insurance preference. Use up  to four times daily as directed.  Dispense: 1 each; Refill: 0 - Insulin Glargine (BASAGLAR KWIKPEN) 100 UNIT/ML; Inject 22 Units into the skin daily.  Dispense: 15 mL; Refill: 2 - He was started on  Metformin ER for GI symptoms, was educated on side effects and advised to notify clinic.  2. Blurry vision - He was encouraged on tighter glycemic control and we have to follow up in regards to him receiving his eyeglasses.  3. Tobacco abuse - He was encouraged to abstain from smoking. - CT CHEST LUNG CA SCREEN LOW DOSE W/O CM; Future  4. Nausea - He was encouraged to drink plenty of water. - He was encouraged to go Urgent Care or the ED with worsening symptoms.   5. Hypertension, unspecified type - His blood pressure is low today. He was encouraged to check his blood pressure at home and if SBP is less than 100, he should not take his Chlorthalidone and Lisinopril and recheck in 2 hours and do the same and notify clinic. - He was encouraged to increase fluid  intake and recheck his blood pressure in 4 hours. If his systolic BP is still in the 80s, he should go to the ED. - He was encouraged to change position slowly . Unable to check his labs. - chlorthalidone (HYGROTON) 25 MG tablet; Take 1 tablet (25 mg total) by mouth once daily.  Dispense: 30 tablet; Refill: 2  6. Hyperlipidemia, unspecified hyperlipidemia type - He will continue taking his current medication. - He was encouraged to follow a low fat diet and exercise as tolerated. - atorvastatin (LIPITOR) 40 MG tablet; Take 2 tablets (80 mg total) by mouth daily.  Dispense: 180 tablet; Refill: 2  7. Gastroesophageal reflux disease, unspecified whether esophagitis present - He was started on Protonix, encouraged to avoid spicy foods and to sit upright after he eats.  - pantoprazole (PROTONIX) 40 MG tablet; Take 1 tablet (40 mg total) by mouth daily.  Dispense: 30 tablet; Refill: 0    He will follow up in one month, 03/23/2022, or if he  worsens or fails to improve.   Henry Harman, FNP Student

## 2022-02-24 ENCOUNTER — Other Ambulatory Visit: Payer: Self-pay

## 2022-03-09 ENCOUNTER — Other Ambulatory Visit: Payer: Self-pay

## 2022-03-10 ENCOUNTER — Other Ambulatory Visit: Payer: Self-pay | Admitting: Neurology

## 2022-03-10 ENCOUNTER — Other Ambulatory Visit: Payer: Self-pay

## 2022-03-10 DIAGNOSIS — I639 Cerebral infarction, unspecified: Secondary | ICD-10-CM

## 2022-03-23 ENCOUNTER — Other Ambulatory Visit: Payer: Self-pay

## 2022-03-23 ENCOUNTER — Ambulatory Visit: Payer: Medicaid Other | Admitting: Gerontology

## 2022-03-23 VITALS — BP 112/67 | HR 116 | Temp 98.0°F | Resp 20 | Wt 166.7 lb

## 2022-03-23 DIAGNOSIS — R000 Tachycardia, unspecified: Secondary | ICD-10-CM

## 2022-03-23 DIAGNOSIS — Z0000 Encounter for general adult medical examination without abnormal findings: Secondary | ICD-10-CM

## 2022-03-23 DIAGNOSIS — K219 Gastro-esophageal reflux disease without esophagitis: Secondary | ICD-10-CM

## 2022-03-23 DIAGNOSIS — E119 Type 2 diabetes mellitus without complications: Secondary | ICD-10-CM

## 2022-03-23 DIAGNOSIS — F17200 Nicotine dependence, unspecified, uncomplicated: Secondary | ICD-10-CM

## 2022-03-23 DIAGNOSIS — E785 Hyperlipidemia, unspecified: Secondary | ICD-10-CM

## 2022-03-23 MED ORDER — METFORMIN HCL ER 500 MG PO TB24
1000.0000 mg | ORAL_TABLET | Freq: Every day | ORAL | 1 refills | Status: DC
  Filled 2022-03-23: qty 60, 30d supply, fill #0
  Filled 2022-03-23: qty 180, 90d supply, fill #0
  Filled 2022-04-25: qty 60, 30d supply, fill #1
  Filled 2022-05-23: qty 60, 30d supply, fill #2
  Filled 2022-06-03 – 2022-06-21 (×3): qty 60, 30d supply, fill #3
  Filled 2022-07-07 – 2022-07-08 (×2): qty 60, 30d supply, fill #4

## 2022-03-23 MED ORDER — ATORVASTATIN CALCIUM 80 MG PO TABS
80.0000 mg | ORAL_TABLET | Freq: Every day | ORAL | 3 refills | Status: DC
  Filled 2022-03-23 (×2): qty 30, 30d supply, fill #0
  Filled 2022-03-23: qty 90, 90d supply, fill #0
  Filled 2022-04-25: qty 30, 30d supply, fill #1
  Filled 2022-05-23: qty 30, 30d supply, fill #2
  Filled 2022-06-21: qty 30, 30d supply, fill #3
  Filled 2022-07-20: qty 30, 30d supply, fill #4
  Filled 2022-09-01: qty 30, 30d supply, fill #5
  Filled 2022-10-10: qty 30, 30d supply, fill #6

## 2022-03-23 MED ORDER — PANTOPRAZOLE SODIUM 40 MG PO TBEC
40.0000 mg | DELAYED_RELEASE_TABLET | Freq: Every day | ORAL | 0 refills | Status: DC
  Filled 2022-03-23: qty 30, 30d supply, fill #0
  Filled 2022-03-23: qty 90, 90d supply, fill #0
  Filled 2022-04-25 – 2022-04-26 (×2): qty 30, 30d supply, fill #1
  Filled 2022-05-23: qty 30, 30d supply, fill #2

## 2022-03-23 NOTE — Progress Notes (Signed)
Established Patient Office Visit  Subjective   Patient ID: Henry Anderson, male    DOB: [birth year]  Age: 57 y.o. MRN: [id number]   HPI  Henry Anderson is a 57 y/o male who has history of Type 2 diabetes mellitus, Emphysema, Hypertension, and Stroke who presents for a follow up visit. He states that he feels the best today that he has felt in a while. He is taking all of his medications as prescribed. His blood pressure was 112/67 and his HR was 116 during today's visit. He has been increasing his fluid intake and denies chest pain, palpitations, or shortness of breath. He does occasionally get lightheaded when he stands up but this it not new for him. His HgbA1c done on 02/23/22 was 11.9%, and during his last visit, he was switched to Metformin ER and states that his GI symptoms have significantly improved. He checks his blood glucose levels in the morning, at lunchtime, and after dinner. He did not bring his log today but states that the fasting blood glucose level in the morning generally runs around 118-120. His evening blood glucose levels run higher but that is after his dinner. He is trying to stay away from sugary and fatty foods. He denies hypoglycemic symptoms but does endorse urinary frequency, no flank pain or pain with urination. He was prescribed Protonix during his last visit and he states that his acid reflux symptoms are significantly improved as well. He is currently smoking 1/2 pack of cigarettes per day but shares the desire to quit completely. He is interested in an eye exam and having a colonoscopy performed. Overall, he is doing well and offers no further complaints.   Review of Systems  Constitutional: Negative.   HENT: Negative.    Eyes: Negative.   Respiratory: Negative.    Cardiovascular: Negative.   Gastrointestinal: Negative.   Genitourinary:  Positive for frequency.  Musculoskeletal: Negative.   Skin: Negative.   Neurological: Negative.   Endo/Heme/Allergies: Negative.    Psychiatric/Behavioral: Negative.       Objective:     BP 112/67 (BP Location: Left Arm, Patient Position: Sitting, Cuff Size: Normal)   Pulse (!) 116   Temp 98 F (36.7 C) (Oral)   Resp 20   Wt 166 lb 11.2 oz (75.6 kg)   SpO2 97%   BMI 24.62 kg/m  BP Readings from Last 3 Encounters:  03/23/22 112/67  02/23/22 96/63  02/09/22 (!) 143/77   Wt Readings from Last 3 Encounters:  03/23/22 166 lb 11.2 oz (75.6 kg)  02/23/22 162 lb 4.8 oz (73.6 kg)  02/09/22 168 lb (76.2 kg)      Physical Exam Constitutional:      Appearance: Normal appearance. He is normal weight.  HENT:     Head: Normocephalic.  Eyes:     Pupils: Pupils are equal, round, and reactive to light.  Cardiovascular:     Rate and Rhythm: Regular rhythm. Tachycardia present.     Pulses: Normal pulses.     Heart sounds: Normal heart sounds.  Pulmonary:     Effort: Pulmonary effort is normal.     Breath sounds: Normal breath sounds.  Abdominal:     General: Abdomen is flat. Bowel sounds are normal.     Palpations: Abdomen is soft.  Skin:    General: Skin is warm and dry.     Capillary Refill: Capillary refill takes less than 2 seconds.  Neurological:     General: No focal deficit present.  Mental Status: He is alert and oriented to person, place, and time. Mental status is at baseline.  Psychiatric:        Mood and Affect: Mood normal.        Behavior: Behavior normal.        Thought Content: Thought content normal.        Judgment: Judgment normal.     Last CBC Lab Results  Component Value Date   WBC 9.2 02/09/2022   HGB 15.0 02/09/2022   HCT 45.4 02/09/2022   MCV 88.3 02/09/2022   MCH 29.2 02/09/2022   RDW 12.8 02/09/2022   PLT 216 [id number]   Last metabolic panel Lab Results  Component Value Date   GLUCOSE 361 (H) 02/09/2022   NA 137 02/09/2022   K 3.8 02/09/2022   CL 101 02/09/2022   CO2 25 02/09/2022   BUN 14 02/09/2022   CREATININE 0.80 02/09/2022   GFRNONAA >60 02/09/2022    CALCIUM 8.8 (L) 02/09/2022   PROT 7.8 07/07/2021   ALBUMIN 4.8 07/07/2021   LABGLOB 3.0 07/07/2021   AGRATIO 1.6 07/07/2021   BILITOT 0.3 07/07/2021   ALKPHOS 81 07/07/2021   AST 18 07/07/2021   ALT 24 07/07/2021   ANIONGAP 11 02/09/2022   Last lipids Lab Results  Component Value Date   CHOL 139 07/07/2021   HDL 32 (L) 07/07/2021   LDLCALC 77 07/07/2021   LDLDIRECT 109.2 (H) 10/14/2020   TRIG 178 (H) 07/07/2021   CHOLHDL 4.3 07/07/2021   Last hemoglobin A1c Lab Results  Component Value Date   HGBA1C 11.9 (A) 02/23/2022   Last thyroid functions Lab Results  Component Value Date   TSH 2.400 01/07/2021   Last vitamin D Lab Results  Component Value Date   VD25OH 11.5 (L) 07/07/2021   Last vitamin B12 and Folate Lab Results  Component Value Date   VITAMINB12 515 01/07/2021      The ASCVD Risk score (Arnett DK, et al., 2019) failed to calculate for the following reasons:   The patient has a prior MI or stroke diagnosis    Assessment & Plan:  1. Hyperlipidemia, unspecified hyperlipidemia type - He will continue taking his current medication. - He was encouraged to follow a low fat diet and to exercise as tolerated. - atorvastatin (LIPITOR) 80 MG tablet; Take 1 tablet (80 mg total) by mouth daily.  Dispense: 90 tablet; Refill: 3  2. Type 2 diabetes mellitus without complication, without long-term current use of insulin (HCC) - His HgbA1c was 11.9% and his goal should be less than 7%. He was encouraged to continue monitoring his blood glucose levels at home and to bring his log at his next visit.  - He will continue taking his current medication. - He was encouraged to follow low carb/concentrated sugar diet. - He was educated about hypoglycemia. - POCT Glucose (CBG); Future - metFORMIN (GLUCOPHAGE-XR) 500 MG 24 hr tablet; Take 2 tablets (1,000 mg total) by mouth daily with breakfast.  Dispense: 180 tablet; Refill: 1  3. Gastroesophageal reflux disease,  unspecified whether esophagitis present His acid reflux is improved with taking Protonix, He will continue taking his current medication. - He was advised to avoid spicy foods and to sit upright after he eats. -Avoid spicy, fatty and fried food -Avoid sodas and sour juices -Avoid heavy meals -Avoid eating 4 hours before bedtime -Elevate head of bed at night - pantoprazole (PROTONIX) 40 MG tablet; Take 1 tablet (40 mg total) by mouth daily.  Dispense:  90 tablet; Refill: 0  4. Smoking - He was encouraged to abstain from smoking. - CT CHEST LUNG CA SCREEN LOW DOSE W/O CM; Future  5. Tachycardia - He will follow up with cardiology. He was provided with the contact information for the office. He was advised to go to the ED for worsening symptoms.  6. Health care maintenance - He was advised to schedule an appointment with Cardiology and Gastroenterology for Colonoscopy screening.  Return in about 2 months (around 05/24/2022), or if symptoms worsen or fail to improve.    Eloise Harman, FNP Student

## 2022-03-23 NOTE — Patient Instructions (Signed)
Carbohydrate Counting for Diabetes Mellitus, Adult Carbohydrate counting is a method of keeping track of how many carbohydrates you eat. Eating carbohydrates increases the amount of sugar (glucose) in the blood. Counting how many carbohydrates you eat improves how well you manage your blood glucose. This, in turn, helps you manage your diabetes. Carbohydrates are measured in grams (g) per serving. It is important to know how many carbohydrates (in grams or by serving size) you can have in each meal. This is different for every person. A dietitian can help you make a meal plan and calculate how many carbohydrates you should have at each meal and snack. What foods contain carbohydrates? Carbohydrates are found in the following foods: Grains, such as breads and cereals. Dried beans and soy products. Starchy vegetables, such as potatoes, peas, and corn. Fruit and fruit juices. Milk and yogurt. Sweets and snack foods, such as cake, cookies, candy, chips, and soft drinks. How do I count carbohydrates in foods? There are two ways to count carbohydrates in food. You can read food labels or learn standard serving sizes of foods. You can use either of these methods or a combination of both. Using the Nutrition Facts label The Nutrition Facts list is included on the labels of almost all packaged foods and beverages in the [place]. It includes: The serving size. Information about nutrients in each serving, including the grams of carbohydrate per serving. To use the Nutrition Facts, decide how many servings you will have. Then, multiply the number of servings by the number of carbohydrates per serving. The resulting number is the total grams of carbohydrates that you will be having. Learning the standard serving sizes of foods When you eat carbohydrate foods that are not packaged or do not include Nutrition Facts on the label, you need to measure the servings in order to count the grams of  carbohydrates. Measure the foods that you will eat with a food scale or measuring cup, if needed. Decide how many standard-size servings you will eat. Multiply the number of servings by 15. For foods that contain carbohydrates, one serving equals 15 g of carbohydrates. For example, if you eat 2 cups or 10 oz (300 g) of strawberries, you will have eaten 2 servings and 30 g of carbohydrates (2 servings x 15 g = 30 g). For foods that have more than one food mixed, such as soups and casseroles, you must count the carbohydrates in each food that is included. The following list contains standard serving sizes of common carbohydrate-rich foods. Each of these servings has about 15 g of carbohydrates: 1 slice of bread. 1 six-inch (15 cm) tortilla. ? cup or 2 oz (53 g) cooked rice or pasta.  cup or 3 oz (85 g) cooked or canned, drained and rinsed beans or lentils.  cup or 3 oz (85 g) starchy vegetable, such as peas, corn, or squash.  cup or 4 oz (120 g) hot cereal.  cup or 3 oz (85 g) boiled or mashed potatoes, or  or 3 oz (85 g) of a large baked potato.  cup or 4 fl oz (118 mL) fruit juice. 1 cup or 8 fl oz (237 mL) milk. 1 small or 4 oz (106 g) apple.  or 2 oz (63 g) of a medium banana. 1 cup or 5 oz (150 g) strawberries. 3 cups or 1 oz (28.3 g) popped popcorn. What is an example of carbohydrate counting? To calculate the grams of carbohydrates in this sample meal, follow the steps   shown below. Sample meal 3 oz (85 g) chicken breast. ? cup or 4 oz (106 g) brown rice.  cup or 3 oz (85 g) corn. 1 cup or 8 fl oz (237 mL) milk. 1 cup or 5 oz (150 g) strawberries with sugar-free whipped topping. Carbohydrate calculation Identify the foods that contain carbohydrates: Rice. Corn. Milk. Strawberries. Calculate how many servings you have of each food: 2 servings rice. 1 serving corn. 1 serving milk. 1 serving strawberries. Multiply each number of servings by 15 g: 2 servings rice x 15  g = 30 g. 1 serving corn x 15 g = 15 g. 1 serving milk x 15 g = 15 g. 1 serving strawberries x 15 g = 15 g. Add together all of the amounts to find the total grams of carbohydrates eaten: 30 g + 15 g + 15 g + 15 g = 75 g of carbohydrates total. What are tips for following this plan? Shopping Develop a meal plan and then make a shopping list. Buy fresh and frozen vegetables, fresh and frozen fruit, dairy, eggs, beans, lentils, and whole grains. Look at food labels. Choose foods that have more fiber and less sugar. Avoid processed foods and foods with added sugars. Meal planning Aim to have the same number of grams of carbohydrates at each meal and for each snack time. Plan to have regular, balanced meals and snacks. Where to find more information American Diabetes Association: diabetes.org Centers for Disease Control and Prevention: cdc.gov Academy of Nutrition and Dietetics: eatright.org Association of Diabetes Care & Education Specialists: diabeteseducator.org Summary Carbohydrate counting is a method of keeping track of how many carbohydrates you eat. Eating carbohydrates increases the amount of sugar (glucose) in your blood. Counting how many carbohydrates you eat improves how well you manage your blood glucose. This helps you manage your diabetes. A dietitian can help you make a meal plan and calculate how many carbohydrates you should have at each meal and snack. This information is not intended to replace advice given to you by your health care provider. Make sure you discuss any questions you have with your health care provider. Document Revised: 07/31/2019 Document Reviewed: 07/31/2019 Elsevier Patient Education  2023 Elsevier Inc. DASH Eating Plan DASH stands for Dietary Approaches to Stop Hypertension. The DASH eating plan is a healthy eating plan that has been shown to: Reduce high blood pressure (hypertension). Reduce your risk for type 2 diabetes, heart disease, and  stroke. Help with weight loss. What are tips for following this plan? Reading food labels Check food labels for the amount of salt (sodium) per serving. Choose foods with less than 5 percent of the Daily Value of sodium. Generally, foods with less than 300 milligrams (mg) of sodium per serving fit into this eating plan. To find whole grains, look for the word "whole" as the first word in the ingredient list. Shopping Buy products labeled as "low-sodium" or "no salt added." Buy fresh foods. Avoid canned foods and pre-made or frozen meals. Cooking Avoid adding salt when cooking. Use salt-free seasonings or herbs instead of table salt or sea salt. Check with your health care provider or pharmacist before using salt substitutes. Do not fry foods. Cook foods using healthy methods such as baking, boiling, grilling, roasting, and broiling instead. Cook with heart-healthy oils, such as olive, canola, avocado, soybean, or sunflower oil. Meal planning  Eat a balanced diet that includes: 4 or more servings of fruits and 4 or more servings of vegetables each day.   Try to fill one-half of your plate with fruits and vegetables. 6-8 servings of whole grains each day. Less than 6 oz (170 g) of lean meat, poultry, or fish each day. A 3-oz (85-g) serving of meat is about the same size as a deck of cards. One egg equals 1 oz (28 g). 2-3 servings of low-fat dairy each day. One serving is 1 cup (237 mL). 1 serving of nuts, seeds, or beans 5 times each week. 2-3 servings of heart-healthy fats. Healthy fats called omega-3 fatty acids are found in foods such as walnuts, flaxseeds, fortified milks, and eggs. These fats are also found in cold-water fish, such as sardines, salmon, and mackerel. Limit how much you eat of: Canned or prepackaged foods. Food that is high in trans fat, such as some fried foods. Food that is high in saturated fat, such as fatty meat. Desserts and other sweets, sugary drinks, and other foods  with added sugar. Full-fat dairy products. Do not salt foods before eating. Do not eat more than 4 egg yolks a week. Try to eat at least 2 vegetarian meals a week. Eat more home-cooked food and less restaurant, buffet, and fast food. Lifestyle When eating at a restaurant, ask that your food be prepared with less salt or no salt, if possible. If you drink alcohol: Limit how much you use to: 0-1 drink a day for women who are not pregnant. 0-2 drinks a day for men. Be aware of how much alcohol is in your drink. In the [place] one drink equals one 12 oz bottle of beer (355 mL), one 5 oz glass of wine (148 mL), or one 1 oz glass of hard liquor (44 mL). General information Avoid eating more than 2,300 mg of salt a day. If you have hypertension, you may need to reduce your sodium intake to 1,500 mg a day. Work with your health care provider to maintain a healthy body weight or to lose weight. Ask what an ideal weight is for you. Get at least 30 minutes of exercise that causes your heart to beat faster (aerobic exercise) most days of the week. Activities may include walking, swimming, or biking. Work with your health care provider or dietitian to adjust your eating plan to your individual calorie needs. What foods should I eat? Fruits All fresh, dried, or frozen fruit. Canned fruit in natural juice (without added sugar). Vegetables Fresh or frozen vegetables (raw, steamed, roasted, or grilled). Low-sodium or reduced-sodium tomato and vegetable juice. Low-sodium or reduced-sodium tomato sauce and tomato paste. Low-sodium or reduced-sodium canned vegetables. Grains Whole-grain or whole-wheat bread. Whole-grain or whole-wheat pasta. Brown rice. Oatmeal. Quinoa. Bulgur. Whole-grain and low-sodium cereals. Pita bread. Low-fat, low-sodium crackers. Whole-wheat flour tortillas. Meats and other proteins Skinless chicken or turkey. Ground chicken or turkey. Pork with fat trimmed off. Fish and seafood. Egg  whites. Dried beans, peas, or lentils. Unsalted nuts, nut butters, and seeds. Unsalted canned beans. Lean cuts of beef with fat trimmed off. Low-sodium, lean precooked or cured meat, such as sausages or meat loaves. Dairy Low-fat (1%) or fat-free (skim) milk. Reduced-fat, low-fat, or fat-free cheeses. Nonfat, low-sodium ricotta or cottage cheese. Low-fat or nonfat yogurt. Low-fat, low-sodium cheese. Fats and oils Soft margarine without trans fats. Vegetable oil. Reduced-fat, low-fat, or light mayonnaise and salad dressings (reduced-sodium). Canola, safflower, olive, avocado, soybean, and sunflower oils. Avocado. Seasonings and condiments Herbs. Spices. Seasoning mixes without salt. Other foods Unsalted popcorn and pretzels. Fat-free sweets. The items listed above may not be   a complete list of foods and beverages you can eat. Contact a dietitian for more information. What foods should I avoid? Fruits Canned fruit in a light or heavy syrup. Fried fruit. Fruit in cream or butter sauce. Vegetables Creamed or fried vegetables. Vegetables in a cheese sauce. Regular canned vegetables (not low-sodium or reduced-sodium). Regular canned tomato sauce and paste (not low-sodium or reduced-sodium). Regular tomato and vegetable juice (not low-sodium or reduced-sodium). Pickles. Olives. Grains Baked goods made with fat, such as croissants, muffins, or some breads. Dry pasta or rice meal packs. Meats and other proteins Fatty cuts of meat. Ribs. Fried meat. Bacon. Bologna, salami, and other precooked or cured meats, such as sausages or meat loaves. Fat from the back of a pig (fatback). Bratwurst. Salted nuts and seeds. Canned beans with added salt. Canned or smoked fish. Whole eggs or egg yolks. Chicken or turkey with skin. Dairy Whole or 2% milk, cream, and half-and-half. Whole or full-fat cream cheese. Whole-fat or sweetened yogurt. Full-fat cheese. Nondairy creamers. Whipped toppings. Processed cheese and  cheese spreads. Fats and oils Butter. Stick margarine. Lard. Shortening. Ghee. Bacon fat. Tropical oils, such as coconut, palm kernel, or palm oil. Seasonings and condiments Onion salt, garlic salt, seasoned salt, table salt, and sea salt. Worcestershire sauce. Tartar sauce. Barbecue sauce. Teriyaki sauce. Soy sauce, including reduced-sodium. Steak sauce. Canned and packaged gravies. Fish sauce. Oyster sauce. Cocktail sauce. Store-bought horseradish. Ketchup. Mustard. Meat flavorings and tenderizers. Bouillon cubes. Hot sauces. Pre-made or packaged marinades. Pre-made or packaged taco seasonings. Relishes. Regular salad dressings. Other foods Salted popcorn and pretzels. The items listed above may not be a complete list of foods and beverages you should avoid. Contact a dietitian for more information. Where to find more information National Heart, Lung, and Blood Institute: www.nhlbi.nih.gov American Heart Association: www.heart.org Academy of Nutrition and Dietetics: www.eatright.org National Kidney Foundation: www.kidney.org Summary The DASH eating plan is a healthy eating plan that has been shown to reduce high blood pressure (hypertension). It may also reduce your risk for type 2 diabetes, heart disease, and stroke. When on the DASH eating plan, aim to eat more fresh fruits and vegetables, whole grains, lean proteins, low-fat dairy, and heart-healthy fats. With the DASH eating plan, you should limit salt (sodium) intake to 2,300 mg a day. If you have hypertension, you may need to reduce your sodium intake to 1,500 mg a day. Work with your health care provider or dietitian to adjust your eating plan to your individual calorie needs. This information is not intended to replace advice given to you by your health care provider. Make sure you discuss any questions you have with your health care provider. Document Revised: 11/30/2018 Document Reviewed: 11/30/2018 Elsevier Patient Education  2023  Elsevier Inc.  

## 2022-03-24 ENCOUNTER — Ambulatory Visit: Payer: Self-pay

## 2022-03-25 ENCOUNTER — Other Ambulatory Visit: Payer: Self-pay | Admitting: Neurology

## 2022-03-25 ENCOUNTER — Other Ambulatory Visit: Payer: Self-pay

## 2022-03-25 DIAGNOSIS — I639 Cerebral infarction, unspecified: Secondary | ICD-10-CM

## 2022-03-28 ENCOUNTER — Other Ambulatory Visit: Payer: Self-pay

## 2022-03-29 ENCOUNTER — Other Ambulatory Visit: Payer: Self-pay

## 2022-03-29 ENCOUNTER — Other Ambulatory Visit: Payer: Self-pay | Admitting: Gerontology

## 2022-03-29 DIAGNOSIS — I639 Cerebral infarction, unspecified: Secondary | ICD-10-CM

## 2022-03-29 MED FILL — Clopidogrel Bisulfate Tab 75 MG (Base Equiv): ORAL | 90 days supply | Qty: 90 | Fill #0 | Status: AC

## 2022-04-06 ENCOUNTER — Other Ambulatory Visit: Payer: Self-pay

## 2022-04-25 MED FILL — Insulin Pen Needle 32 G X 4 MM (1/6" or 5/32"): 100 days supply | Qty: 100 | Fill #2 | Status: AC

## 2022-04-26 ENCOUNTER — Other Ambulatory Visit: Payer: Self-pay

## 2022-04-27 ENCOUNTER — Other Ambulatory Visit: Payer: Self-pay

## 2022-05-05 ENCOUNTER — Other Ambulatory Visit: Payer: Self-pay

## 2022-05-06 ENCOUNTER — Encounter: Payer: Self-pay | Admitting: Cardiology

## 2022-05-06 ENCOUNTER — Ambulatory Visit: Payer: Self-pay | Attending: Cardiology | Admitting: Cardiology

## 2022-05-06 VITALS — BP 124/78 | HR 97 | Ht 69.0 in | Wt 168.4 lb

## 2022-05-06 DIAGNOSIS — E785 Hyperlipidemia, unspecified: Secondary | ICD-10-CM

## 2022-05-06 DIAGNOSIS — F17200 Nicotine dependence, unspecified, uncomplicated: Secondary | ICD-10-CM

## 2022-05-06 DIAGNOSIS — I10 Essential (primary) hypertension: Secondary | ICD-10-CM

## 2022-05-06 NOTE — Progress Notes (Signed)
Cardiology Office Note:    Date:  05/06/2022   ID:  Henry Anderson, DOB 07/28/1965, MRN [id number]  PCP:  Rolm Gala, NP   [facility] Providers Cardiologist:  None     Referring MD: Rolm Gala, NP   Chief Complaint  Patient presents with   Follow-up    12 month f/u.  Patient denies new or acute cardiac problems/concerns today.      History of Present Illness:    Henry Anderson is a 57 y.o. male with a hx of hypertension, diabetes, current smoker x40+ years, COPD, CVA presenting for follow-up.   Last seen after a stroke, cardiac monitor was placed to evaluate any arrhythmia such as A-fib or flutter which was unrevealing.  He still smokes, working on quitting, down to half a pack a day.  Denies chest pain, endorses shortness of breath which is unchanged from before.  He has COPD.  Walks with a walker due to wobbly gait.  States trying to eat healthier, less fried foods, more greens.  Prior notes Echo 10/2020 showed EF 60 to 65%, normal diastolic function, no significant valvular abnormalities.  .  30-day cardiac monitor requested to evaluate presence of arrhythmias. Brain MRI 2020, infarct in the right midbrain and thalamus.   Past Medical History:  Diagnosis Date   Diabetes mellitus without complication (HCC)    Emphysema of lung (HCC)    Hypertension    Stroke [facility])     Past Surgical History:  Procedure Laterality Date   FOOT SURGERY Right    KNEE SURGERY Right    SHOULDER SURGERY Left     Current Medications: Current Meds  Medication Sig   acetaminophen (TYLENOL) 325 MG tablet Take 2 tablets (650 mg total) by mouth every 4 (four) hours as needed for mild pain (or temp > 37.5 C (99.5 F)).   albuterol (PROAIR HFA) 108 (90 Base) MCG/ACT inhaler Inhale 2 puffs into the lungs every 6 (six) hours as needed for wheezing or shortness of breath.   Aspirin (VAZALORE) 81 MG CAPS Take 1 tablet by mouth daily.   atorvastatin (LIPITOR) 80 MG tablet Take 1  tablet (80 mg total) by mouth daily.   chlorthalidone (HYGROTON) 25 MG tablet Take 1 tablet (25 mg total) by mouth once daily.   clopidogrel (PLAVIX) 75 MG tablet Take 1 tablet (75 mg total) by mouth once daily.   glucose blood (RIGHTEST GS550 BLOOD GLUCOSE) test strip Use up to four times daily as directed to check blood sugar.   glucose blood test strip Use to check blood glucose up to 4 times a day   Insulin Glargine (BASAGLAR KWIKPEN) 100 UNIT/ML Inject 22 Units into the skin daily.   Insulin Pen Needle (COMFORT EZ PEN NEEDLES) 32G X 4 MM MISC Use with Basaglar insulin pen.   lisinopril (ZESTRIL) 20 MG tablet Take 1 tablet (20 mg total) by mouth once daily.   metFORMIN (GLUCOPHAGE-XR) 500 MG 24 hr tablet Take 2 tablets (1,000 mg total) by mouth daily with breakfast.   mometasone-formoterol (DULERA) 100-5 MCG/ACT AERO Inhale 2 puffs into the lungs 2 (two) times daily.   naproxen sodium (ALEVE) 220 MG tablet Take 220 mg by mouth daily as needed.   pantoprazole (PROTONIX) 40 MG tablet Take 1 tablet (40 mg total) by mouth daily.   Rightest GL300 Lancets MISC Use up to four times daily as directed to check blood sugar.   Rightest GL300 Lancets MISC use to check blood glucose  up to 4 times a day     Allergies:   Patient has no known allergies.   Social History   Socioeconomic History   Marital status: Married    Spouse name: Not on file   Number of children: Not on file   Years of education: Not on file   Highest education level: Not on file  Occupational History   Not on file  Tobacco Use   Smoking status: Every Day    Packs/day: 1.50    Years: 30.00    Additional pack years: 0.00    Total pack years: 45.00    Types: Cigarettes   Smokeless tobacco: Former    Types: Chew   Tobacco comments:    Patient has cut down to ~1 ppd since having stroke. Gave [place] Quit info. Patient down to 1/2 ppd  Vaping Use   Vaping Use: Never used  Substance and Sexual Activity   Alcohol use: Yes     Comment: occasionally 1-2 beers   Drug use: Never   Sexual activity: Not on file  Other Topics Concern   Not on file  Social History Narrative   Not on file   Social Determinants of Health   Financial Resource Strain: High Risk (11/23/2021)   Overall Financial Resource Strain (CARDIA)    Difficulty of Paying Living Expenses: Very hard  Food Insecurity: Food Insecurity Present (11/23/2021)   Hunger Vital Sign    Worried About Running Out of Food in the Last Year: Often true    Ran Out of Food in the Last Year: Often true  Transportation Needs: No Transportation Needs (11/23/2021)   PRAPARE - Administrator, Civil Service (Medical): No    Lack of Transportation (Non-Medical): No  Physical Activity: Inactive (11/23/2021)   Exercise Vital Sign    Days of Exercise per Week: 0 days    Minutes of Exercise per Session: 0 min  Stress: Stress Concern Present (11/23/2021)   [employer] of Occupational Health - Occupational Stress Questionnaire    Feeling of Stress : Very much  Social Connections: Socially Isolated (11/23/2021)   Social Connection and Isolation Panel [NHANES]    Frequency of Communication with Friends and Family: Never    Frequency of Social Gatherings with Friends and Family: Never    Attends Religious Services: Never    Database administrator or Organizations: No    Attends Engineer, structural: Never    Marital Status: Married     Family History: The patient's family history includes COPD in his maternal grandmother and mother; Diabetes in his father, paternal grandfather, paternal grandmother, sister, and sister; Heart attack in his maternal grandfather; Hyperlipidemia in his paternal grandfather and paternal grandmother; Hypertension in his paternal grandfather and paternal grandmother.  ROS:   Please see the history of present illness.     All other systems reviewed and are negative.  EKGs/Labs/Other Studies Reviewed:    The  following studies were reviewed today:   EKG:  EKG is ordered today.  The ekg ordered today demonstrates normal sinus rhythm, normal axis, heart rate 97  Recent Labs: 07/07/2021: ALT 24 02/09/2022: BUN 14; Creatinine, Ser 0.80; Hemoglobin 15.0; Platelets 216; Potassium 3.8; Sodium 137  Recent Lipid Panel    Component Value Date/Time   CHOL 139 07/07/2021 1136   TRIG 178 (H) 07/07/2021 1136   HDL 32 (L) 07/07/2021 1136   CHOLHDL 4.3 07/07/2021 1136   CHOLHDL 7.6 10/14/2020 0559   VLDL  UNABLE TO CALCULATE IF TRIGLYCERIDE OVER 400 mg/dL 16/10/9602 5409   LDLCALC 77 07/07/2021 1136   LDLDIRECT 109.2 (H) 10/14/2020 0559     Risk Assessment/Calculations:          Physical Exam:    VS:  BP 124/78 (BP Location: Left Arm, Patient Position: Sitting, Cuff Size: Normal)   Pulse 97   Ht 5\' 9"  (1.753 m)   Wt 168 lb 6.4 oz (76.4 kg)   SpO2 97%   BMI 24.87 kg/m     Wt Readings from Last 3 Encounters:  05/06/22 168 lb 6.4 oz (76.4 kg)  03/23/22 166 lb 11.2 oz (75.6 kg)  02/23/22 162 lb 4.8 oz (73.6 kg)     GEN:  Well nourished, well developed in no acute distress HEENT: Normal NECK: No JVD; No carotid bruits CARDIAC: RRR, no murmurs, rubs, gallops RESPIRATORY: No wheezing noted, diminished breath sounds bilaterally ABDOMEN: Soft, non-tender, non-distended MUSCULOSKELETAL:  No edema; No deformity  SKIN: Warm and dry NEUROLOGIC:  Alert and oriented x 3 PSYCHIATRIC:  Normal affect   ASSESSMENT:    1. Primary hypertension   2. Hyperlipidemia, unspecified hyperlipidemia type   3. Smoking    PLAN:    In order of problems listed above:  Hypertension, BP controlled.  Continue chlorthalidone, lisinopril. Hyperlipidemia, continue Lipitor 80 mg daily. Current smoker, smoking cessation again advised.  Follow-up as needed.    Medication Adjustments/Labs and Tests Ordered: Current medicines are reviewed at length with the patient today.  Concerns regarding medicines are outlined  above.  Orders Placed This Encounter  Procedures   EKG 12-Lead   No orders of the defined types were placed in this encounter.   Patient Instructions  Medication Instructions:   Your physician recommends that you continue on your current medications as directed. Please refer to the Current Medication list given to you today.  *If you need a refill on your cardiac medications before your next appointment, please call your pharmacy*   Lab Work:  None Ordered  If you have labs (blood work) drawn today and your tests are completely normal, you will receive your results only by: MyChart Message (if you have MyChart) OR A paper copy in the mail If you have any lab test that is abnormal or we need to change your treatment, we will call you to review the results.   Testing/Procedures:  None Ordered   Follow-Up: At [facility], you and your health needs are our priority.  As part of our continuing mission to provide you with exceptional heart care, we have created designated Provider Care Teams.  These Care Teams include your primary Cardiologist (physician) and Advanced Practice Providers (APPs -  Physician Assistants and Nurse Practitioners) who all work together to provide you with the care you need, when you need it.  We recommend signing up for the patient portal called "MyChart".  Sign up information is provided on this After Visit Summary.  MyChart is used to connect with patients for Virtual Visits (Telemedicine).  Patients are able to view lab/test results, encounter notes, upcoming appointments, etc.  Non-urgent messages can be sent to your provider as well.   To learn more about what you can do with MyChart, go to [email or web address].    Your next appointment:    As needed     Signed, Debbe Odea, MD  05/06/2022 11:50 AM    [facility]

## 2022-05-06 NOTE — Patient Instructions (Signed)
Medication Instructions:   Your physician recommends that you continue on your current medications as directed. Please refer to the Current Medication list given to you today.  *If you need a refill on your cardiac medications before your next appointment, please call your pharmacy*   Lab Work:  None Ordered  If you have labs (blood work) drawn today and your tests are completely normal, you will receive your results only by: MyChart Message (if you have MyChart) OR A paper copy in the mail If you have any lab test that is abnormal or we need to change your treatment, we will call you to review the results.   Testing/Procedures:  None Ordered   Follow-Up: At [facility], you and your health needs are our priority.  As part of our continuing mission to provide you with exceptional heart care, we have created designated Provider Care Teams.  These Care Teams include your primary Cardiologist (physician) and Advanced Practice Providers (APPs -  Physician Assistants and Nurse Practitioners) who all work together to provide you with the care you need, when you need it.  We recommend signing up for the patient portal called "MyChart".  Sign up information is provided on this After Visit Summary.  MyChart is used to connect with patients for Virtual Visits (Telemedicine).  Patients are able to view lab/test results, encounter notes, upcoming appointments, etc.  Non-urgent messages can be sent to your provider as well.   To learn more about what you can do with MyChart, go to [email or web address].    Your next appointment:    As needed 

## 2022-05-23 ENCOUNTER — Other Ambulatory Visit: Payer: Self-pay

## 2022-05-24 ENCOUNTER — Ambulatory Visit: Payer: Medicaid Other | Admitting: Gerontology

## 2022-05-24 ENCOUNTER — Encounter: Payer: Self-pay | Admitting: Gerontology

## 2022-05-24 ENCOUNTER — Other Ambulatory Visit: Payer: Self-pay

## 2022-05-24 VITALS — BP 133/90 | HR 92 | Temp 98.1°F | Resp 16 | Ht 69.0 in | Wt 171.8 lb

## 2022-05-24 DIAGNOSIS — K219 Gastro-esophageal reflux disease without esophagitis: Secondary | ICD-10-CM

## 2022-05-24 DIAGNOSIS — I639 Cerebral infarction, unspecified: Secondary | ICD-10-CM

## 2022-05-24 DIAGNOSIS — I10 Essential (primary) hypertension: Secondary | ICD-10-CM

## 2022-05-24 DIAGNOSIS — E119 Type 2 diabetes mellitus without complications: Secondary | ICD-10-CM

## 2022-05-24 LAB — POCT GLYCOSYLATED HEMOGLOBIN (HGB A1C): Hemoglobin A1C: 8.9 % — AB (ref 4.0–5.6)

## 2022-05-24 LAB — GLUCOSE, POCT (MANUAL RESULT ENTRY): POC Glucose: 178 mg/dl — AB (ref 70–99)

## 2022-05-24 MED ORDER — CHLORTHALIDONE 25 MG PO TABS
25.0000 mg | ORAL_TABLET | Freq: Every day | ORAL | 2 refills | Status: DC
  Filled 2022-05-24 – 2022-06-10 (×2): qty 30, 30d supply, fill #0
  Filled 2022-07-07: qty 30, 30d supply, fill #1
  Filled 2022-08-09: qty 30, 30d supply, fill #2

## 2022-05-24 MED ORDER — CLOPIDOGREL BISULFATE 75 MG PO TABS
75.0000 mg | ORAL_TABLET | Freq: Every day | ORAL | 0 refills | Status: DC
  Filled 2022-05-24 – 2022-07-04 (×2): qty 90, 90d supply, fill #0
  Filled 2022-07-07: qty 30, 30d supply, fill #0
  Filled 2022-08-09: qty 30, 30d supply, fill #1
  Filled 2022-09-07: qty 30, 30d supply, fill #2

## 2022-05-24 MED ORDER — PANTOPRAZOLE SODIUM 40 MG PO TBEC
40.0000 mg | DELAYED_RELEASE_TABLET | Freq: Every day | ORAL | 0 refills | Status: DC
  Filled 2022-05-24: qty 90, 90d supply, fill #0

## 2022-05-24 MED ORDER — COMFORT EZ PEN NEEDLES 32G X 4 MM MISC
1.0000 | Freq: Every day | 2 refills | Status: DC
  Filled 2022-05-24: qty 100, fill #0
  Filled 2022-08-09: qty 100, 100d supply, fill #0
  Filled 2022-11-24 – 2022-12-07 (×2): qty 100, 90d supply, fill #1
  Filled 2023-03-02: qty 100, 90d supply, fill #2

## 2022-05-24 NOTE — Progress Notes (Signed)
Established Patient Office Visit  Subjective   Patient ID: Henry Anderson, male    DOB: 1965/01/13  Age: 57 y.o. MRN: [id number]  Chief Complaint  Patient presents with   Follow-up    Patient has been approved for disability and full medicaid.   Diabetes    HPI  ARIUS Anderson is a 57 y/o male who has history of Type 2 diabetes mellitus, Emphysema, Hypertension, and Stroke who presents for a follow up visit and lab review. He states that he's compliant with his medications, denies side effects and continues to make healthy lifestyle changes. His HgbA1c checked during visit decreased from 11.9% to 8.9%. He states that he checks his blood glucose bid, and his fasting readings are usually less than 130 mg/dl. He denies hypo/hyperglycemic symptoms and peripheral neuropathy. He was seen by the Cardiologist on 05/06/22  by Dr Azucena Cecil and he's continue with his medications. Overall, he states that he is doing well and offers no further complaint.  Review of Systems  Constitutional: Negative.   Eyes: Negative.   Respiratory: Negative.    Cardiovascular: Negative.   Genitourinary: Negative.   Neurological: Negative.   Endo/Heme/Allergies: Negative.   Psychiatric/Behavioral: Negative.        Objective:     BP (!) 133/90 (BP Location: Right Arm, Patient Position: Sitting, Cuff Size: Normal)   Pulse 92   Temp 98.1 F (36.7 C) (Oral)   Resp 16   Ht 5\' 9"  (1.753 m)   Wt 171 lb 12.8 oz (77.9 kg)   SpO2 94%   BMI 25.37 kg/m  BP Readings from Last 3 Encounters:  05/24/22 (!) 133/90  05/06/22 124/78  03/23/22 112/67   Wt Readings from Last 3 Encounters:  05/24/22 171 lb 12.8 oz (77.9 kg)  05/06/22 168 lb 6.4 oz (76.4 kg)  03/23/22 166 lb 11.2 oz (75.6 kg)      Physical Exam HENT:     Head: Normocephalic and atraumatic.     Mouth/Throat:     Mouth: Mucous membranes are moist.  Eyes:     Extraocular Movements: Extraocular movements intact.     Conjunctiva/sclera:  Conjunctivae normal.     Pupils: Pupils are equal, round, and reactive to light.  Cardiovascular:     Rate and Rhythm: Normal rate and regular rhythm.     Pulses: Normal pulses.     Heart sounds: Normal heart sounds.  Pulmonary:     Effort: Pulmonary effort is normal.     Breath sounds: Normal breath sounds.  Skin:    General: Skin is warm.  Neurological:     General: No focal deficit present.     Mental Status: He is alert and oriented to person, place, and time. Mental status is at baseline.  Psychiatric:        Mood and Affect: Mood normal.        Behavior: Behavior normal.        Thought Content: Thought content normal.        Judgment: Judgment normal.      Results for orders placed or performed in visit on 05/24/22  POCT Glucose (CBG)  Result Value Ref Range   POC Glucose 178 (A) 70 - 99 mg/dl  POCT HgB W0J  Result Value Ref Range   Hemoglobin A1C 8.9 (A) 4.0 - 5.6 %   HbA1c POC (<> result, manual entry)     HbA1c, POC (prediabetic range)     HbA1c, POC (controlled diabetic range)  Last CBC Lab Results  Component Value Date   WBC 9.2 02/09/2022   HGB 15.0 02/09/2022   HCT 45.4 02/09/2022   MCV 88.3 02/09/2022   MCH 29.2 02/09/2022   RDW 12.8 02/09/2022   PLT 216 02/09/2022   Last metabolic panel Lab Results  Component Value Date   GLUCOSE 361 (H) 02/09/2022   NA 137 02/09/2022   K 3.8 02/09/2022   CL 101 02/09/2022   CO2 25 02/09/2022   BUN 14 02/09/2022   CREATININE 0.80 02/09/2022   GFRNONAA >60 02/09/2022   CALCIUM 8.8 (L) 02/09/2022   PROT 7.8 07/07/2021   ALBUMIN 4.8 07/07/2021   LABGLOB 3.0 07/07/2021   AGRATIO 1.6 07/07/2021   BILITOT 0.3 07/07/2021   ALKPHOS 81 07/07/2021   AST 18 07/07/2021   ALT 24 07/07/2021   ANIONGAP 11 02/09/2022   Last lipids Lab Results  Component Value Date   CHOL 139 07/07/2021   HDL 32 (L) 07/07/2021   LDLCALC 77 07/07/2021   LDLDIRECT 109.2 (H) 10/14/2020   TRIG 178 (H) 07/07/2021   CHOLHDL 4.3  07/07/2021   Last hemoglobin A1c Lab Results  Component Value Date   HGBA1C 8.9 (A) 05/24/2022   Last thyroid functions Lab Results  Component Value Date   TSH 2.400 01/07/2021   Last vitamin D Lab Results  Component Value Date   VD25OH 11.5 (L) 07/07/2021      The ASCVD Risk score (Arnett DK, et al., 2019) failed to calculate for the following reasons:   The patient has a prior MI or stroke diagnosis    Assessment & Plan:   1. Type 2 diabetes mellitus without complication, without long-term current use of insulin (HCC) - His diabetes is improving, HgbA1c is 8.9%, he will continue current medication, low carb/non concentrated sweet diet and exercise as tolerated. His goal should be less than 7%. - POCT Glucose (CBG) - POCT HgB A1C - Insulin Pen Needle (COMFORT EZ PEN NEEDLES) 32G X 4 MM MISC; Use with Basaglar insulin pen.  Dispense: 100 each; Refill: 2  2. Hypertension, unspecified type - His blood pressure is improving, he will continue current medication, DASH diet and smoking cessation. - chlorthalidone (HYGROTON) 25 MG tablet; Take 1 tablet (25 mg total) by mouth once daily.  Dispense: 30 tablet; Refill: 2  3. Cerebrovascular accident (CVA), unspecified mechanism (HCC) - He will continue current medication, advised to notify clinic with hematuria, hematochezia and go to the ED for active bleeding. - clopidogrel (PLAVIX) 75 MG tablet; Take 1 tablet (75 mg total) by mouth once daily.  Dispense: 90 tablet; Refill: 0  4. Gastroesophageal reflux disease, unspecified whether esophagitis present - His acid reflux is under control with taking medications. -Avoid spicy, fatty and fried food -Avoid sodas and sour juices -Avoid heavy meals -Avoid eating 4 hours before bedtime -Elevate head of bed at night - pantoprazole (PROTONIX) 40 MG tablet; Take 1 tablet (40 mg total) by mouth daily.  Dispense: 90 tablet; Refill: 0    Return if symptoms worsen or fail to improve. He  has no follow up visit due to active Medicaid. [facility] wishes him well with his care.   Henry Skyles Trellis Paganini, NP

## 2022-05-24 NOTE — Patient Instructions (Signed)
DASH Eating Plan DASH stands for Dietary Approaches to Stop Hypertension. The DASH eating plan is a healthy eating plan that has been shown to: Reduce high blood pressure (hypertension). Reduce your risk for type 2 diabetes, heart disease, and stroke. Help with weight loss. What are tips for following this plan? Reading food labels Check food labels for the amount of salt (sodium) per serving. Choose foods with less than 5 percent of the Daily Value of sodium. Generally, foods with less than 300 milligrams (mg) of sodium per serving fit into this eating plan. To find whole grains, look for the word "whole" as the first word in the ingredient list. Shopping Buy products labeled as "low-sodium" or "no salt added." Buy fresh foods. Avoid canned foods and pre-made or frozen meals. Cooking Avoid adding salt when cooking. Use salt-free seasonings or herbs instead of table salt or sea salt. Check with your health care provider or pharmacist before using salt substitutes. Do not fry foods. Cook foods using healthy methods such as baking, boiling, grilling, roasting, and broiling instead. Cook with heart-healthy oils, such as olive, canola, avocado, soybean, or sunflower oil. Meal planning  Eat a balanced diet that includes: 4 or more servings of fruits and 4 or more servings of vegetables each day. Try to fill one-half of your plate with fruits and vegetables. 6-8 servings of whole grains each day. Less than 6 oz (170 g) of lean meat, poultry, or fish each day. A 3-oz (85-g) serving of meat is about the same size as a deck of cards. One egg equals 1 oz (28 g). 2-3 servings of low-fat dairy each day. One serving is 1 cup (237 mL). 1 serving of nuts, seeds, or beans 5 times each week. 2-3 servings of heart-healthy fats. Healthy fats called omega-3 fatty acids are found in foods such as walnuts, flaxseeds, fortified milks, and eggs. These fats are also found in cold-water fish, such as sardines, salmon,  and mackerel. Limit how much you eat of: Canned or prepackaged foods. Food that is high in trans fat, such as some fried foods. Food that is high in saturated fat, such as fatty meat. Desserts and other sweets, sugary drinks, and other foods with added sugar. Full-fat dairy products. Do not salt foods before eating. Do not eat more than 4 egg yolks a week. Try to eat at least 2 vegetarian meals a week. Eat more home-cooked food and less restaurant, buffet, and fast food. Lifestyle When eating at a restaurant, ask that your food be prepared with less salt or no salt, if possible. If you drink alcohol: Limit how much you use to: 0-1 drink a day for women who are not pregnant. 0-2 drinks a day for men. Be aware of how much alcohol is in your drink. In the [place] one drink equals one 12 oz bottle of beer (355 mL), one 5 oz glass of wine (148 mL), or one 1 oz glass of hard liquor (44 mL). General information Avoid eating more than 2,300 mg of salt a day. If you have hypertension, you may need to reduce your sodium intake to 1,500 mg a day. Work with your health care provider to maintain a healthy body weight or to lose weight. Ask what an ideal weight is for you. Get at least 30 minutes of exercise that causes your heart to beat faster (aerobic exercise) most days of the week. Activities may include walking, swimming, or biking. Work with your health care provider or dietitian to   adjust your eating plan to your individual calorie needs. What foods should I eat? Fruits All fresh, dried, or frozen fruit. Canned fruit in natural juice (without added sugar). Vegetables Fresh or frozen vegetables (raw, steamed, roasted, or grilled). Low-sodium or reduced-sodium tomato and vegetable juice. Low-sodium or reduced-sodium tomato sauce and tomato paste. Low-sodium or reduced-sodium canned vegetables. Grains Whole-grain or whole-wheat bread. Whole-grain or whole-wheat pasta. Brown rice. Oatmeal. Quinoa.  Bulgur. Whole-grain and low-sodium cereals. Pita bread. Low-fat, low-sodium crackers. Whole-wheat flour tortillas. Meats and other proteins Skinless chicken or turkey. Ground chicken or turkey. Pork with fat trimmed off. Fish and seafood. Egg whites. Dried beans, peas, or lentils. Unsalted nuts, nut butters, and seeds. Unsalted canned beans. Lean cuts of beef with fat trimmed off. Low-sodium, lean precooked or cured meat, such as sausages or meat loaves. Dairy Low-fat (1%) or fat-free (skim) milk. Reduced-fat, low-fat, or fat-free cheeses. Nonfat, low-sodium ricotta or cottage cheese. Low-fat or nonfat yogurt. Low-fat, low-sodium cheese. Fats and oils Soft margarine without trans fats. Vegetable oil. Reduced-fat, low-fat, or light mayonnaise and salad dressings (reduced-sodium). Canola, safflower, olive, avocado, soybean, and sunflower oils. Avocado. Seasonings and condiments Herbs. Spices. Seasoning mixes without salt. Other foods Unsalted popcorn and pretzels. Fat-free sweets. The items listed above may not be a complete list of foods and beverages you can eat. Contact a dietitian for more information. What foods should I avoid? Fruits Canned fruit in a light or heavy syrup. Fried fruit. Fruit in cream or butter sauce. Vegetables Creamed or fried vegetables. Vegetables in a cheese sauce. Regular canned vegetables (not low-sodium or reduced-sodium). Regular canned tomato sauce and paste (not low-sodium or reduced-sodium). Regular tomato and vegetable juice (not low-sodium or reduced-sodium). Pickles. Olives. Grains Baked goods made with fat, such as croissants, muffins, or some breads. Dry pasta or rice meal packs. Meats and other proteins Fatty cuts of meat. Ribs. Fried meat. Bacon. Bologna, salami, and other precooked or cured meats, such as sausages or meat loaves. Fat from the back of a pig (fatback). Bratwurst. Salted nuts and seeds. Canned beans with added salt. Canned or smoked fish.  Whole eggs or egg yolks. Chicken or turkey with skin. Dairy Whole or 2% milk, cream, and half-and-half. Whole or full-fat cream cheese. Whole-fat or sweetened yogurt. Full-fat cheese. Nondairy creamers. Whipped toppings. Processed cheese and cheese spreads. Fats and oils Butter. Stick margarine. Lard. Shortening. Ghee. Bacon fat. Tropical oils, such as coconut, palm kernel, or palm oil. Seasonings and condiments Onion salt, garlic salt, seasoned salt, table salt, and sea salt. Worcestershire sauce. Tartar sauce. Barbecue sauce. Teriyaki sauce. Soy sauce, including reduced-sodium. Steak sauce. Canned and packaged gravies. Fish sauce. Oyster sauce. Cocktail sauce. Store-bought horseradish. Ketchup. Mustard. Meat flavorings and tenderizers. Bouillon cubes. Hot sauces. Pre-made or packaged marinades. Pre-made or packaged taco seasonings. Relishes. Regular salad dressings. Other foods Salted popcorn and pretzels. The items listed above may not be a complete list of foods and beverages you should avoid. Contact a dietitian for more information. Where to find more information National Heart, Lung, and Blood Institute: www.nhlbi.nih.gov American Heart Association: www.heart.org Academy of Nutrition and Dietetics: www.eatright.org National Kidney Foundation: www.kidney.org Summary The DASH eating plan is a healthy eating plan that has been shown to reduce high blood pressure (hypertension). It may also reduce your risk for type 2 diabetes, heart disease, and stroke. When on the DASH eating plan, aim to eat more fresh fruits and vegetables, whole grains, lean proteins, low-fat dairy, and heart-healthy fats. With the DASH   eating plan, you should limit salt (sodium) intake to 2,300 mg a day. If you have hypertension, you may need to reduce your sodium intake to 1,500 mg a day. Work with your health care provider or dietitian to adjust your eating plan to your individual calorie needs. This information is not  intended to replace advice given to you by your health care provider. Make sure you discuss any questions you have with your health care provider. Document Revised: 11/30/2018 Document Reviewed: 11/30/2018 Elsevier Patient Education  2023 Elsevier Inc. Carbohydrate Counting for Diabetes Mellitus, Adult Carbohydrate counting is a method of keeping track of how many carbohydrates you eat. Eating carbohydrates increases the amount of sugar (glucose) in the blood. Counting how many carbohydrates you eat improves how well you manage your blood glucose. This, in turn, helps you manage your diabetes. Carbohydrates are measured in grams (g) per serving. It is important to know how many carbohydrates (in grams or by serving size) you can have in each meal. This is different for every person. A dietitian can help you make a meal plan and calculate how many carbohydrates you should have at each meal and snack. What foods contain carbohydrates? Carbohydrates are found in the following foods: Grains, such as breads and cereals. Dried beans and soy products. Starchy vegetables, such as potatoes, peas, and corn. Fruit and fruit juices. Milk and yogurt. Sweets and snack foods, such as cake, cookies, candy, chips, and soft drinks. How do I count carbohydrates in foods? There are two ways to count carbohydrates in food. You can read food labels or learn standard serving sizes of foods. You can use either of these methods or a combination of both. Using the Nutrition Facts label The Nutrition Facts list is included on the labels of almost all packaged foods and beverages in the [place]. It includes: The serving size. Information about nutrients in each serving, including the grams of carbohydrate per serving. To use the Nutrition Facts, decide how many servings you will have. Then, multiply the number of servings by the number of carbohydrates per serving. The resulting number is the total grams of  carbohydrates that you will be having. Learning the standard serving sizes of foods When you eat carbohydrate foods that are not packaged or do not include Nutrition Facts on the label, you need to measure the servings in order to count the grams of carbohydrates. Measure the foods that you will eat with a food scale or measuring cup, if needed. Decide how many standard-size servings you will eat. Multiply the number of servings by 15. For foods that contain carbohydrates, one serving equals 15 g of carbohydrates. For example, if you eat 2 cups or 10 oz (300 g) of strawberries, you will have eaten 2 servings and 30 g of carbohydrates (2 servings x 15 g = 30 g). For foods that have more than one food mixed, such as soups and casseroles, you must count the carbohydrates in each food that is included. The following list contains standard serving sizes of common carbohydrate-rich foods. Each of these servings has about 15 g of carbohydrates: 1 slice of bread. 1 six-inch (15 cm) tortilla. ? cup or 2 oz (53 g) cooked rice or pasta.  cup or 3 oz (85 g) cooked or canned, drained and rinsed beans or lentils.  cup or 3 oz (85 g) starchy vegetable, such as peas, corn, or squash.  cup or 4 oz (120 g) hot cereal.  cup or 3 oz (85   g) boiled or mashed potatoes, or  or 3 oz (85 g) of a large baked potato.  cup or 4 fl oz (118 mL) fruit juice. 1 cup or 8 fl oz (237 mL) milk. 1 small or 4 oz (106 g) apple.  or 2 oz (63 g) of a medium banana. 1 cup or 5 oz (150 g) strawberries. 3 cups or 1 oz (28.3 g) popped popcorn. What is an example of carbohydrate counting? To calculate the grams of carbohydrates in this sample meal, follow the steps shown below. Sample meal 3 oz (85 g) chicken breast. ? cup or 4 oz (106 g) brown rice.  cup or 3 oz (85 g) corn. 1 cup or 8 fl oz (237 mL) milk. 1 cup or 5 oz (150 g) strawberries with sugar-free whipped topping. Carbohydrate calculation Identify the foods that  contain carbohydrates: Rice. Corn. Milk. Strawberries. Calculate how many servings you have of each food: 2 servings rice. 1 serving corn. 1 serving milk. 1 serving strawberries. Multiply each number of servings by 15 g: 2 servings rice x 15 g = 30 g. 1 serving corn x 15 g = 15 g. 1 serving milk x 15 g = 15 g. 1 serving strawberries x 15 g = 15 g. Add together all of the amounts to find the total grams of carbohydrates eaten: 30 g + 15 g + 15 g + 15 g = 75 g of carbohydrates total. What are tips for following this plan? Shopping Develop a meal plan and then make a shopping list. Buy fresh and frozen vegetables, fresh and frozen fruit, dairy, eggs, beans, lentils, and whole grains. Look at food labels. Choose foods that have more fiber and less sugar. Avoid processed foods and foods with added sugars. Meal planning Aim to have the same number of grams of carbohydrates at each meal and for each snack time. Plan to have regular, balanced meals and snacks. Where to find more information American Diabetes Association: diabetes.org Centers for Disease Control and Prevention: cdc.gov Academy of Nutrition and Dietetics: eatright.org Association of Diabetes Care & Education Specialists: diabeteseducator.org Summary Carbohydrate counting is a method of keeping track of how many carbohydrates you eat. Eating carbohydrates increases the amount of sugar (glucose) in your blood. Counting how many carbohydrates you eat improves how well you manage your blood glucose. This helps you manage your diabetes. A dietitian can help you make a meal plan and calculate how many carbohydrates you should have at each meal and snack. This information is not intended to replace advice given to you by your health care provider. Make sure you discuss any questions you have with your health care provider. Document Revised: 07/31/2019 Document Reviewed: 07/31/2019 Elsevier Patient Education  2023 Elsevier  Inc.  

## 2022-06-03 ENCOUNTER — Other Ambulatory Visit: Payer: Self-pay

## 2022-06-03 ENCOUNTER — Other Ambulatory Visit: Payer: Self-pay | Admitting: Gerontology

## 2022-06-03 DIAGNOSIS — E119 Type 2 diabetes mellitus without complications: Secondary | ICD-10-CM

## 2022-06-03 MED ORDER — RIGHTEST GS550 BLOOD GLUCOSE VI STRP
ORAL_STRIP | 0 refills | Status: DC
  Filled 2022-06-03 – 2022-06-21 (×4): qty 100, fill #0
  Filled 2022-07-04: qty 100, 25d supply, fill #0
  Filled 2022-07-07 – 2022-07-08 (×2): qty 100, fill #0

## 2022-06-03 MED FILL — Albuterol Sulfate Inhal Aero 108 MCG/ACT (90MCG Base Equiv): RESPIRATORY_TRACT | 25 days supply | Qty: 6.7 | Fill #2 | Status: AC

## 2022-06-06 ENCOUNTER — Other Ambulatory Visit: Payer: Self-pay

## 2022-06-07 ENCOUNTER — Other Ambulatory Visit: Payer: Self-pay

## 2022-06-10 ENCOUNTER — Other Ambulatory Visit: Payer: Self-pay

## 2022-06-21 ENCOUNTER — Other Ambulatory Visit: Payer: Self-pay

## 2022-06-22 ENCOUNTER — Other Ambulatory Visit: Payer: Self-pay

## 2022-06-23 ENCOUNTER — Ambulatory Visit: Payer: Medicaid Other | Admitting: Internal Medicine

## 2022-06-23 ENCOUNTER — Other Ambulatory Visit: Payer: Self-pay

## 2022-06-23 ENCOUNTER — Encounter: Payer: Self-pay | Admitting: Internal Medicine

## 2022-06-23 VITALS — BP 106/78 | HR 121 | Ht 69.0 in | Wt 169.0 lb

## 2022-06-23 DIAGNOSIS — Z860100 Personal history of colon polyps, unspecified: Secondary | ICD-10-CM | POA: Insufficient documentation

## 2022-06-23 DIAGNOSIS — K219 Gastro-esophageal reflux disease without esophagitis: Secondary | ICD-10-CM

## 2022-06-23 DIAGNOSIS — E1142 Type 2 diabetes mellitus with diabetic polyneuropathy: Secondary | ICD-10-CM

## 2022-06-23 DIAGNOSIS — E559 Vitamin D deficiency, unspecified: Secondary | ICD-10-CM

## 2022-06-23 DIAGNOSIS — E782 Mixed hyperlipidemia: Secondary | ICD-10-CM | POA: Insufficient documentation

## 2022-06-23 DIAGNOSIS — Z800 Family history of malignant neoplasm of digestive organs: Secondary | ICD-10-CM | POA: Insufficient documentation

## 2022-06-23 DIAGNOSIS — E1140 Type 2 diabetes mellitus with diabetic neuropathy, unspecified: Secondary | ICD-10-CM | POA: Diagnosis not present

## 2022-06-23 DIAGNOSIS — J438 Other emphysema: Secondary | ICD-10-CM

## 2022-06-23 DIAGNOSIS — Z720 Tobacco use: Secondary | ICD-10-CM

## 2022-06-23 DIAGNOSIS — L989 Disorder of the skin and subcutaneous tissue, unspecified: Secondary | ICD-10-CM

## 2022-06-23 DIAGNOSIS — I10 Essential (primary) hypertension: Secondary | ICD-10-CM | POA: Diagnosis not present

## 2022-06-23 DIAGNOSIS — E1165 Type 2 diabetes mellitus with hyperglycemia: Secondary | ICD-10-CM | POA: Diagnosis not present

## 2022-06-23 DIAGNOSIS — R202 Paresthesia of skin: Secondary | ICD-10-CM

## 2022-06-23 LAB — POC CREATINE & ALBUMIN,URINE
Albumin/Creatinine Ratio, Urine, POC: <30
Creatinine, POC: 100 mg/dL
Microalbumin Ur, POC: 30 mg/L

## 2022-06-23 LAB — POCT CBG (FASTING - GLUCOSE)-MANUAL ENTRY: Glucose Fasting, POC: 254 mg/dL — AB (ref 70–99)

## 2022-06-23 MED ORDER — GABAPENTIN 100 MG PO CAPS
100.0000 mg | ORAL_CAPSULE | Freq: Every day | ORAL | 2 refills | Status: DC
Start: 2022-06-23 — End: 2022-09-07
  Filled 2022-06-23: qty 30, 30d supply, fill #0
  Filled 2022-07-16: qty 30, 30d supply, fill #1
  Filled 2022-08-17: qty 30, 30d supply, fill #2

## 2022-06-23 NOTE — Progress Notes (Signed)
New Patient Office Visit  Subjective    Patient ID: Henry Anderson, male    DOB: 1965-11-05  Age: 57 y.o. MRN: [id number]  CC:  Chief Complaint  Patient presents with   Establish Care    NPE    HPI ROCKET GUNDERSON presents to establish care Previous Primary Care provider/office:   he does have additional concerns to discuss today.   Patient comes in to establish primary care at our office.  He has extensive medical history as listed in his chart.  Today today he reports that he is taking all his medications regularly.  He mentions lower extremity pain and burning especially in his feet is his hands and feet the both feel very cold all the time.  Patient has lost a lot of weight over the last few years.  He does not have any chest pain or palpitations.  But he does have has some balance issues as a result of his stroke.  He is fasting for blood work.  Will start gabapentin for his paresthesias. On his left forearm there are 3 lesions which have have scales and scabs.  They do not itch and are not painful or tender.  Will get a dermatology consult. Patient has personal history of colon polyps as well as family history of colon cancer.  His last colonoscopy was more than 5 years ago.  Will set up GI consult for.  Patient reports that he gets regular eye exams. He is under care of cardiology and neurology. He continues to smoke cigarettes but he says he is trying very hard to cut back.    Outpatient Encounter Medications as of 06/23/2022  Medication Sig   acetaminophen (TYLENOL) 325 MG tablet Take 2 tablets (650 mg total) by mouth every 4 (four) hours as needed for mild pain (or temp > 37.5 C (99.5 F)).   albuterol (PROAIR HFA) 108 (90 Base) MCG/ACT inhaler Inhale 2 puffs into the lungs every 6 (six) hours as needed for wheezing or shortness of breath.   Aspirin (VAZALORE) 81 MG CAPS Take 1 tablet by mouth daily.   atorvastatin (LIPITOR) 80 MG tablet Take 1 tablet (80 mg total) by mouth  daily.   chlorthalidone (HYGROTON) 25 MG tablet Take 1 tablet (25 mg total) by mouth once daily.   clopidogrel (PLAVIX) 75 MG tablet Take 1 tablet (75 mg total) by mouth once daily.   gabapentin (NEURONTIN) 100 MG capsule Take 1 capsule (100 mg total) by mouth at bedtime.   glucose blood (RIGHTEST GS550 BLOOD GLUCOSE) test strip Use up to four times daily as directed to check blood sugar.   glucose blood (RIGHTEST GS550 BLOOD GLUCOSE) test strip Use to check blood glucose up to 4 times a day   Insulin Glargine (BASAGLAR KWIKPEN) 100 UNIT/ML Inject 22 Units into the skin daily.   Insulin Pen Needle (COMFORT EZ PEN NEEDLES) 32G X 4 MM MISC Use with Basaglar insulin pen.   lisinopril (ZESTRIL) 20 MG tablet Take 1 tablet (20 mg total) by mouth once daily.   metFORMIN (GLUCOPHAGE-XR) 500 MG 24 hr tablet Take 2 tablets (1,000 mg total) by mouth daily with breakfast.   mometasone-formoterol (DULERA) 100-5 MCG/ACT AERO Inhale 2 puffs into the lungs 2 (two) times daily.   naproxen sodium (ALEVE) 220 MG tablet Take 220 mg by mouth daily as needed.   pantoprazole (PROTONIX) 40 MG tablet Take 1 tablet (40 mg total) by mouth daily.   Rightest GL300 Lancets MISC Use  up to four times daily as directed to check blood sugar.   No facility-administered encounter medications on file as of 06/23/2022.    Past Medical History:  Diagnosis Date   Diabetes mellitus without complication (HCC)    Emphysema of lung (HCC)    Hypertension    Stroke [facility])     Past Surgical History:  Procedure Laterality Date   FOOT SURGERY Right    KNEE SURGERY Right    SHOULDER SURGERY Left     Family History  Problem Relation Age of Onset   COPD Mother    Diabetes Father    Diabetes Sister    Diabetes Sister    COPD Maternal Grandmother    Heart attack Maternal Grandfather    Diabetes Paternal Grandmother    Hyperlipidemia Paternal Grandmother    Hypertension Paternal Grandmother    Diabetes Paternal Grandfather     Hyperlipidemia Paternal Grandfather    Hypertension Paternal Grandfather     Social History   Socioeconomic History   Marital status: Married    Spouse name: Not on file   Number of children: Not on file   Years of education: Not on file   Highest education level: Not on file  Occupational History   Not on file  Tobacco Use   Smoking status: Every Day    Packs/day: 1.50    Years: 30.00    Additional pack years: 0.00    Total pack years: 45.00    Types: Cigarettes   Smokeless tobacco: Former    Types: Chew   Tobacco comments:    Patient has cut down to ~1 ppd since having stroke. Gave [place] Quit info. Patient down to 1/2 ppd  Vaping Use   Vaping Use: Never used  Substance and Sexual Activity   Alcohol use: Yes    Comment: occasionally 1-2 beers   Drug use: Never   Sexual activity: Not on file  Other Topics Concern   Not on file  Social History Narrative   Not on file   Social Determinants of Health   Financial Resource Strain: High Risk (11/23/2021)   Overall Financial Resource Strain (CARDIA)    Difficulty of Paying Living Expenses: Very hard  Food Insecurity: Food Insecurity Present (11/23/2021)   Hunger Vital Sign    Worried About Running Out of Food in the Last Year: Often true    Ran Out of Food in the Last Year: Often true  Transportation Needs: No Transportation Needs (11/23/2021)   PRAPARE - Administrator, Civil Service (Medical): No    Lack of Transportation (Non-Medical): No  Physical Activity: Inactive (11/23/2021)   Exercise Vital Sign    Days of Exercise per Week: 0 days    Minutes of Exercise per Session: 0 min  Stress: Stress Concern Present (11/23/2021)   [employer] of Occupational Health - Occupational Stress Questionnaire    Feeling of Stress : Very much  Social Connections: Socially Isolated (11/23/2021)   Social Connection and Isolation Panel [NHANES]    Frequency of Communication with Friends and Family: Never     Frequency of Social Gatherings with Friends and Family: Never    Attends Religious Services: Never    Database administrator or Organizations: No    Attends Banker Meetings: Never    Marital Status: Married  Catering manager Violence: Not At Risk (11/23/2021)   Humiliation, Afraid, Rape, and Kick questionnaire    Fear of Current or Ex-Partner: No  Emotionally Abused: No    Physically Abused: No    Sexually Abused: No    Review of Systems  Constitutional:  Positive for weight loss. Negative for chills, fever and malaise/fatigue.  HENT:  Negative for congestion, ear discharge, ear pain, hearing loss, nosebleeds, sore throat and tinnitus.   Eyes:  Positive for blurred vision. Negative for photophobia, pain, discharge and redness.  Respiratory:  Negative for cough, hemoptysis, sputum production, shortness of breath, wheezing and stridor.   Cardiovascular:  Negative for chest pain, palpitations, orthopnea, claudication and leg swelling.  Gastrointestinal:  Negative for abdominal pain, blood in stool, constipation, diarrhea, heartburn, melena, nausea and vomiting.  Genitourinary:  Negative for dysuria, flank pain, hematuria and urgency.  Musculoskeletal:  Negative for back pain, falls, joint pain, myalgias and neck pain.  Neurological:  Positive for sensory change and weakness. Negative for dizziness, tingling, tremors, speech change, focal weakness, seizures, loss of consciousness and headaches.  Psychiatric/Behavioral:  Negative for depression and memory loss. The patient is not nervous/anxious and does not have insomnia.         Objective    BP 106/78   Pulse (!) 121   Ht 5\' 9"  (1.753 m)   Wt 169 lb (76.7 kg)   SpO2 95%   BMI 24.96 kg/m   Physical Exam Vitals and nursing note reviewed.  Constitutional:      Appearance: Normal appearance.  HENT:     Head: Normocephalic and atraumatic.     Nose: Nose normal.  Cardiovascular:     Rate and Rhythm: Normal rate  and regular rhythm.     Pulses: Normal pulses.     Heart sounds: Murmur heard.  Pulmonary:     Effort: Pulmonary effort is normal. No respiratory distress.     Breath sounds: No rhonchi or rales.  Chest:     Chest wall: No tenderness.  Abdominal:     Palpations: Abdomen is soft. There is no mass.     Tenderness: There is no right CVA tenderness, left CVA tenderness, guarding or rebound.  Musculoskeletal:        General: Normal range of motion.     Cervical back: Normal range of motion and neck supple.     Right lower leg: No edema.     Left lower leg: No edema.  Neurological:     General: No focal deficit present.     Mental Status: He is alert and oriented to person, place, and time.  Psychiatric:        Mood and Affect: Mood normal.        Behavior: Behavior normal.        Assessment & Plan:  Check blood work today.  Start gabapentin for bedtime will also add ABI of his lower extremities.  Patient will return to discuss results. Problem List Items Addressed This Visit     Poorly controlled type 2 diabetes mellitus with peripheral neuropathy (HCC) - Primary   Relevant Medications   gabapentin (NEURONTIN) 100 MG capsule   Other Relevant Orders   US ARTERIAL ABI (SCREENING LOWER EXTREMITY)   Essential hypertension, benign   Relevant Orders   CMP14+EGFR   Emphysema of lung (HCC)   Nicotine abuse   GERD (gastroesophageal reflux disease)   Relevant Orders   CBC With Differential   Personal history of colonic polyps   Relevant Orders   Ambulatory referral to Gastroenterology   Family history of colon cancer   Relevant Orders   Ambulatory referral to  Gastroenterology   Mixed hyperlipidemia   Relevant Orders   Lipid Panel w/o Chol/HDL Ratio   Paresthesias   Relevant Orders   Vitamin B12   Vitamin D deficiency   Relevant Orders   Vitamin D (25 hydroxy)   Arm skin lesion, left   Relevant Orders   Ambulatory referral to Dermatology    Return in about 2 weeks  (around 07/07/2022).   Total time spent: 45 minutes  Margaretann Loveless, MD  06/23/2022   This document may have been prepared by [facility] Voice Recognition software and as such may include unintentional dictation errors.

## 2022-06-24 ENCOUNTER — Other Ambulatory Visit: Payer: Self-pay | Admitting: Internal Medicine

## 2022-06-24 ENCOUNTER — Other Ambulatory Visit: Payer: Self-pay

## 2022-06-24 DIAGNOSIS — E1165 Type 2 diabetes mellitus with hyperglycemia: Secondary | ICD-10-CM

## 2022-06-24 DIAGNOSIS — E559 Vitamin D deficiency, unspecified: Secondary | ICD-10-CM

## 2022-06-24 LAB — HEMOGLOBIN A1C
Est. average glucose Bld gHb Est-mCnc: 232 mg/dL
Hgb A1c MFr Bld: 9.7 % — ABNORMAL HIGH (ref 4.8–5.6)

## 2022-06-24 LAB — CMP14+EGFR
ALT: 25 IU/L (ref 0–44)
AST: 22 IU/L (ref 0–40)
Albumin/Globulin Ratio: 1.9
Albumin: 4.7 g/dL (ref 3.8–4.9)
Alkaline Phosphatase: 98 IU/L (ref 44–121)
BUN/Creatinine Ratio: 20 (ref 9–20)
BUN: 21 mg/dL (ref 6–24)
Bilirubin Total: 0.5 mg/dL (ref 0.0–1.2)
CO2: 25 mmol/L (ref 20–29)
Calcium: 9.8 mg/dL (ref 8.7–10.2)
Chloride: 93 mmol/L — ABNORMAL LOW (ref 96–106)
Creatinine, Ser: 1.04 mg/dL (ref 0.76–1.27)
Globulin, Total: 2.5 g/dL (ref 1.5–4.5)
Glucose: 301 mg/dL — ABNORMAL HIGH (ref 70–99)
Potassium: 4.3 mmol/L (ref 3.5–5.2)
Sodium: 138 mmol/L (ref 134–144)
Total Protein: 7.2 g/dL (ref 6.0–8.5)
eGFR: 85 mL/min/{1.73_m2} (ref >59)

## 2022-06-24 LAB — CBC WITH DIFFERENTIAL
Basophils Absolute: 0.1 10*3/uL (ref 0.0–0.2)
Basos: 1 %
EOS (ABSOLUTE): 0.3 10*3/uL (ref 0.0–0.4)
Eos: 2 %
Hematocrit: 50.6 % (ref 37.5–51.0)
Hemoglobin: 16.7 g/dL (ref 13.0–17.7)
Immature Grans (Abs): 0.1 10*3/uL (ref 0.0–0.1)
Immature Granulocytes: 1 %
Lymphocytes Absolute: 2.6 10*3/uL (ref 0.7–3.1)
Lymphs: 17 %
MCH: 30.3 pg (ref 26.6–33.0)
MCHC: 33 g/dL (ref 31.5–35.7)
MCV: 92 fL (ref 79–97)
Monocytes Absolute: 1 10*3/uL — ABNORMAL HIGH (ref 0.1–0.9)
Monocytes: 6 %
Neutrophils Absolute: 11.2 10*3/uL — ABNORMAL HIGH (ref 1.4–7.0)
Neutrophils: 73 %
RBC: 5.51 x10E6/uL (ref 4.14–5.80)
RDW: 12.4 % (ref 11.6–15.4)
WBC: 15.2 10*3/uL — ABNORMAL HIGH (ref 3.4–10.8)

## 2022-06-24 LAB — VITAMIN D 25 HYDROXY (VIT D DEFICIENCY, FRACTURES): Vit D, 25-Hydroxy: 15.8 ng/mL — ABNORMAL LOW (ref 30.0–100.0)

## 2022-06-24 LAB — LIPID PANEL W/O CHOL/HDL RATIO
Cholesterol, Total: 164 mg/dL (ref 100–199)
HDL: 34 mg/dL — ABNORMAL LOW (ref >39)
LDL Chol Calc (NIH): 77 mg/dL (ref 0–99)
Triglycerides: 325 mg/dL — ABNORMAL HIGH (ref 0–149)
VLDL Cholesterol Cal: 53 mg/dL — ABNORMAL HIGH (ref 5–40)

## 2022-06-24 LAB — VITAMIN B12: Vitamin B-12: 811 pg/mL (ref 232–1245)

## 2022-06-24 MED ORDER — DAPAGLIFLOZIN PROPANEDIOL 10 MG PO TABS
10.0000 mg | ORAL_TABLET | Freq: Every day | ORAL | 4 refills | Status: DC
Start: 2022-06-24 — End: 2023-03-03
  Filled 2022-06-24 – 2022-07-08 (×4): qty 30, 30d supply, fill #0
  Filled 2022-08-09: qty 30, 30d supply, fill #1
  Filled 2022-09-07: qty 30, 30d supply, fill #2
  Filled 2022-10-10: qty 30, 30d supply, fill #3
  Filled 2022-11-10 – 2022-12-07 (×3): qty 30, 30d supply, fill #4

## 2022-06-24 MED ORDER — VITAMIN D3 1.25 MG (50000 UT) PO CAPS
1.0000 | ORAL_CAPSULE | ORAL | 3 refills | Status: DC
Start: 2022-06-24 — End: 2023-11-10
  Filled 2022-06-24: qty 12, 84d supply, fill #0
  Filled 2022-10-23: qty 12, 84d supply, fill #1

## 2022-06-27 ENCOUNTER — Other Ambulatory Visit: Payer: Self-pay

## 2022-06-29 ENCOUNTER — Other Ambulatory Visit: Payer: Self-pay

## 2022-06-29 NOTE — Progress Notes (Signed)
Patient notified

## 2022-06-30 ENCOUNTER — Encounter: Payer: Self-pay | Admitting: Cardiovascular Disease

## 2022-06-30 ENCOUNTER — Ambulatory Visit: Payer: Medicaid Other | Admitting: Cardiovascular Disease

## 2022-06-30 ENCOUNTER — Ambulatory Visit (INDEPENDENT_AMBULATORY_CARE_PROVIDER_SITE_OTHER): Payer: Medicaid Other | Admitting: Cardiovascular Disease

## 2022-06-30 VITALS — BP 90/60 | HR 114 | Ht 69.0 in | Wt 167.0 lb

## 2022-06-30 DIAGNOSIS — R0602 Shortness of breath: Secondary | ICD-10-CM

## 2022-06-30 DIAGNOSIS — I10 Essential (primary) hypertension: Secondary | ICD-10-CM

## 2022-06-30 DIAGNOSIS — R42 Dizziness and giddiness: Secondary | ICD-10-CM | POA: Diagnosis not present

## 2022-06-30 DIAGNOSIS — E782 Mixed hyperlipidemia: Secondary | ICD-10-CM

## 2022-06-30 DIAGNOSIS — F17200 Nicotine dependence, unspecified, uncomplicated: Secondary | ICD-10-CM | POA: Diagnosis not present

## 2022-06-30 DIAGNOSIS — Z720 Tobacco use: Secondary | ICD-10-CM

## 2022-06-30 DIAGNOSIS — I4711 Inappropriate sinus tachycardia, so stated: Secondary | ICD-10-CM

## 2022-06-30 DIAGNOSIS — E1142 Type 2 diabetes mellitus with diabetic polyneuropathy: Secondary | ICD-10-CM

## 2022-06-30 DIAGNOSIS — I639 Cerebral infarction, unspecified: Secondary | ICD-10-CM

## 2022-06-30 DIAGNOSIS — E1165 Type 2 diabetes mellitus with hyperglycemia: Secondary | ICD-10-CM

## 2022-06-30 NOTE — Progress Notes (Addendum)
Cardiology Office Note   Date:  07/26/2022   ID:  Henry Anderson, DOB 1965-12-21, MRN [id number]  PCP:  Margaretann Loveless, MD  Cardiologist:  Adrian Blackwater, MD      History of Present Illness: Henry Anderson is a 57 y.o. male who presents for  Chief Complaint  Patient presents with   New Patient (Initial Visit)    Consult    Dizziness This is a chronic problem. The current episode started more than 1 year ago. The problem occurs 2 to 4 times per day. The problem has been waxing and waning. Pertinent negatives include no chest pain. The symptoms are aggravated by walking.  Shortness of Breath This is a chronic problem. The current episode started 1 to 4 weeks ago. The problem has been rapidly worsening. Pertinent negatives include no chest pain or syncope. The symptoms are aggravated by smoke and exercise.      Past Medical History:  Diagnosis Date   Diabetes mellitus without complication (HCC)    Emphysema of lung (HCC)    Hypertension    Stroke [facility])      Past Surgical History:  Procedure Laterality Date   FOOT SURGERY Right    KNEE SURGERY Right    SHOULDER SURGERY Left      Current Outpatient Medications  Medication Sig Dispense Refill   acetaminophen (TYLENOL) 325 MG tablet Take 2 tablets (650 mg total) by mouth every 4 (four) hours as needed for mild pain (or temp > 37.5 C (99.5 F)).     albuterol (PROAIR HFA) 108 (90 Base) MCG/ACT inhaler Inhale 2 puffs into the lungs every 6 (six) hours as needed for wheezing or shortness of breath. 8.5 g 2   Aspirin (VAZALORE) 81 MG CAPS Take 1 tablet by mouth daily.     atorvastatin (LIPITOR) 80 MG tablet Take 1 tablet (80 mg total) by mouth daily. 90 tablet 3   blood glucose meter kit and supplies KIT Use as directed 1 each 0   chlorthalidone (HYGROTON) 25 MG tablet Take 1 tablet (25 mg total) by mouth once daily. 30 tablet 2   Cholecalciferol (VITAMIN D3) 1.25 MG (50000 UT) CAPS Take 1 capsule (1.25 mg total) by mouth  once a week. 12 capsule 3   clopidogrel (PLAVIX) 75 MG tablet Take 1 tablet (75 mg total) by mouth once daily. 90 tablet 0   dapagliflozin propanediol (FARXIGA) 10 MG TABS tablet Take 1 tablet (10 mg total) by mouth daily before breakfast. 30 tablet 4   gabapentin (NEURONTIN) 100 MG capsule Take 1 capsule (100 mg total) by mouth at bedtime. 30 capsule 2   glucose blood (RIGHTEST GS550 BLOOD GLUCOSE) test strip Use up to four times daily as directed to check blood sugar. 100 each 11   glucose blood (RIGHTEST GS550 BLOOD GLUCOSE) test strip Use to check blood glucose up to 4 times a day 100 each 0   Insulin Glargine (BASAGLAR KWIKPEN) 100 UNIT/ML Inject 22 Units into the skin daily. (Patient taking differently: Inject 36 Units into the skin daily.) 15 mL 2   Insulin Pen Needle (COMFORT EZ PEN NEEDLES) 32G X 4 MM MISC Use with Basaglar insulin pen. 100 each 2   lisinopril (ZESTRIL) 20 MG tablet Take 1 tablet (20 mg total) by mouth once daily. 90 tablet 2   metFORMIN (GLUCOPHAGE-XR) 500 MG 24 hr tablet Take 2 tablets (1,000 mg total) by mouth daily with breakfast. 180 tablet 1   mometasone-formoterol (DULERA)  100-5 MCG/ACT AERO Inhale 2 puffs into the lungs 2 (two) times daily. 1 each 2   naproxen sodium (ALEVE) 220 MG tablet Take 220 mg by mouth daily as needed.     pantoprazole (PROTONIX) 40 MG tablet Take 1 tablet (40 mg total) by mouth daily. 90 tablet 0   Rightest GL300 Lancets MISC Use up to four times daily as directed to check blood sugar. 100 each 11   No current facility-administered medications for this visit.    Allergies:   Patient has no known allergies.    Social History:   reports that he has been smoking cigarettes. He has a 45 pack-year smoking history. He has quit using smokeless tobacco.  His smokeless tobacco use included chew. He reports current alcohol use. He reports that he does not use drugs.   Family History:  family history includes COPD in his maternal grandmother and  mother; Diabetes in his father, paternal grandfather, paternal grandmother, sister, and sister; Heart attack in his maternal grandfather; Hyperlipidemia in his paternal grandfather and paternal grandmother; Hypertension in his paternal grandfather and paternal grandmother.    ROS:     Review of Systems  Constitutional: Negative.   HENT: Negative.    Eyes: Negative.   Respiratory:  Positive for shortness of breath.   Cardiovascular:  Negative for chest pain and syncope.  Gastrointestinal: Negative.   Genitourinary: Negative.   Musculoskeletal: Negative.   Skin: Negative.   Neurological:  Positive for dizziness.  Endo/Heme/Allergies: Negative.   Psychiatric/Behavioral: Negative.    All other systems reviewed and are negative.     All other systems are reviewed and negative.    PHYSICAL EXAM: VS:  BP 90/60   Pulse (!) 114   Ht 5\' 9"  (1.753 m)   Wt 167 lb (75.8 kg)   SpO2 95%   BMI 24.66 kg/m  , BMI Body mass index is 24.66 kg/m. Last weight:  Wt Readings from Last 3 Encounters:  07/22/22 172 lb 6.4 oz (78.2 kg)  07/08/22 169 lb 12.8 oz (77 kg)  06/30/22 167 lb (75.8 kg)     Physical Exam Vitals reviewed.  Constitutional:      Appearance: Normal appearance. He is normal weight.  HENT:     Head: Normocephalic.     Nose: Nose normal.     Mouth/Throat:     Mouth: Mucous membranes are moist.  Eyes:     Pupils: Pupils are equal, round, and reactive to light.  Cardiovascular:     Rate and Rhythm: Normal rate and regular rhythm.     Pulses: Normal pulses.     Heart sounds: Normal heart sounds.  Pulmonary:     Effort: Pulmonary effort is normal.  Abdominal:     General: Abdomen is flat. Bowel sounds are normal.  Musculoskeletal:        General: Normal range of motion.     Cervical back: Normal range of motion.  Skin:    General: Skin is warm.  Neurological:     General: No focal deficit present.     Mental Status: He is alert.  Psychiatric:        Mood and  Affect: Mood normal.       EKG:  Sinus tachycardia 109/min PVCS pulmonary disease Recent Labs: 02/09/2022: Platelets 216 06/23/2022: ALT 25; BUN 21; Creatinine, Ser 1.04; Potassium 4.3; Sodium 138 07/22/2022: Hemoglobin 15.1    Lipid Panel    Component Value Date/Time   CHOL 164 06/23/2022 1118  TRIG 325 (H) 06/23/2022 1118   HDL 34 (L) 06/23/2022 1118   CHOLHDL 4.3 07/07/2021 1136   CHOLHDL 7.6 10/14/2020 0559   VLDL UNABLE TO CALCULATE IF TRIGLYCERIDE OVER 400 mg/dL 16/10/9602 5409   LDLCALC 77 06/23/2022 1118   LDLDIRECT 109.2 (H) 10/14/2020 0559      Other studies Reviewed: Additional studies/ records that were reviewed today include:  Review of the above records demonstrates:       No data to display            ASSESSMENT AND PLAN:    ICD-10-CM   1. SOB (shortness of breath)  R06.02 MYOCARDIAL PERFUSION IMAGING    PCV ECHOCARDIOGRAM COMPLETE    CARDIAC EVENT MONITOR    2. Dizziness  R42 MYOCARDIAL PERFUSION IMAGING    PCV ECHOCARDIOGRAM COMPLETE    CARDIAC EVENT MONITOR   may have PVC causing these symptoms. But with h/o sleep apnea, and stroke, risk of atrial fib is high. Advise echo, holter , stress test    3. Mixed hyperlipidemia  E78.2 MYOCARDIAL PERFUSION IMAGING    PCV ECHOCARDIOGRAM COMPLETE    CARDIAC EVENT MONITOR    4. Primary hypertension  I10 MYOCARDIAL PERFUSION IMAGING    PCV ECHOCARDIOGRAM COMPLETE    CARDIAC EVENT MONITOR    5. Inappropriate sinus tachycardia  I47.11 MYOCARDIAL PERFUSION IMAGING    PCV ECHOCARDIOGRAM COMPLETE    CARDIAC EVENT MONITOR   EKG shows today sinus tachycardia and PVC    6. Cerebrovascular accident (CVA), unspecified mechanism (HCC)  I63.9 MYOCARDIAL PERFUSION IMAGING    PCV ECHOCARDIOGRAM COMPLETE    CARDIAC EVENT MONITOR   Patient had prior stroke, and has dizzines with inability to walk, thus needs to have persantine stress test.    7. Poorly controlled type 2 diabetes mellitus with peripheral  neuropathy (HCC)  E11.42 MYOCARDIAL PERFUSION IMAGING   E11.65 PCV ECHOCARDIOGRAM COMPLETE    CARDIAC EVENT MONITOR    8. Smoking  F17.200 MYOCARDIAL PERFUSION IMAGING    PCV ECHOCARDIOGRAM COMPLETE    CARDIAC EVENT MONITOR    9. Nicotine abuse  Z72.0 MYOCARDIAL PERFUSION IMAGING    PCV ECHOCARDIOGRAM COMPLETE    CARDIAC EVENT MONITOR       Problem List Items Addressed This Visit       Cardiovascular and Mediastinum   Cerebrovascular accident (CVA) (HCC)   Relevant Orders   MYOCARDIAL PERFUSION IMAGING   PCV ECHOCARDIOGRAM COMPLETE (Completed)   CARDIAC EVENT MONITOR     Endocrine   Poorly controlled type 2 diabetes mellitus with peripheral neuropathy (HCC)   Relevant Orders   MYOCARDIAL PERFUSION IMAGING   PCV ECHOCARDIOGRAM COMPLETE (Completed)   CARDIAC EVENT MONITOR     Other   Nicotine abuse   Relevant Orders   MYOCARDIAL PERFUSION IMAGING   PCV ECHOCARDIOGRAM COMPLETE (Completed)   CARDIAC EVENT MONITOR   Smoking   Relevant Orders   MYOCARDIAL PERFUSION IMAGING   PCV ECHOCARDIOGRAM COMPLETE (Completed)   CARDIAC EVENT MONITOR   Mixed hyperlipidemia   Relevant Orders   MYOCARDIAL PERFUSION IMAGING   PCV ECHOCARDIOGRAM COMPLETE (Completed)   CARDIAC EVENT MONITOR   Other Visit Diagnoses     SOB (shortness of breath)    -  Primary   Relevant Orders   MYOCARDIAL PERFUSION IMAGING   PCV ECHOCARDIOGRAM COMPLETE (Completed)   CARDIAC EVENT MONITOR   Dizziness       may have PVC causing these symptoms. But with h/o sleep apnea, and stroke, risk of  atrial fib is high. Advise echo, holter , stress test   Relevant Orders   MYOCARDIAL PERFUSION IMAGING   PCV ECHOCARDIOGRAM COMPLETE (Completed)   CARDIAC EVENT MONITOR   Primary hypertension       Relevant Orders   MYOCARDIAL PERFUSION IMAGING   PCV ECHOCARDIOGRAM COMPLETE (Completed)   CARDIAC EVENT MONITOR   Inappropriate sinus tachycardia       EKG shows today sinus tachycardia and PVC   Relevant Orders    MYOCARDIAL PERFUSION IMAGING   PCV ECHOCARDIOGRAM COMPLETE (Completed)   CARDIAC EVENT MONITOR          Disposition:   Return in about 4 weeks (around 07/28/2022) for echo, stress test, and holter and f/u.    Total time spent: 45 minutes  Signed,  Adrian Blackwater, MD  07/26/2022 11:25 AM    [facility]

## 2022-07-01 ENCOUNTER — Encounter: Payer: Self-pay | Admitting: Cardiovascular Disease

## 2022-07-04 ENCOUNTER — Ambulatory Visit (INDEPENDENT_AMBULATORY_CARE_PROVIDER_SITE_OTHER): Payer: Medicaid Other

## 2022-07-04 DIAGNOSIS — E1142 Type 2 diabetes mellitus with diabetic polyneuropathy: Secondary | ICD-10-CM

## 2022-07-04 DIAGNOSIS — E1165 Type 2 diabetes mellitus with hyperglycemia: Secondary | ICD-10-CM | POA: Diagnosis not present

## 2022-07-05 ENCOUNTER — Other Ambulatory Visit: Payer: Self-pay

## 2022-07-07 ENCOUNTER — Ambulatory Visit: Payer: Medicaid Other | Admitting: Internal Medicine

## 2022-07-08 ENCOUNTER — Encounter: Payer: Self-pay | Admitting: Internal Medicine

## 2022-07-08 ENCOUNTER — Other Ambulatory Visit: Payer: Self-pay

## 2022-07-08 ENCOUNTER — Ambulatory Visit: Payer: Medicaid Other | Admitting: Internal Medicine

## 2022-07-08 VITALS — BP 140/80 | HR 97 | Ht 69.0 in | Wt 169.8 lb

## 2022-07-08 DIAGNOSIS — Z720 Tobacco use: Secondary | ICD-10-CM | POA: Diagnosis not present

## 2022-07-08 DIAGNOSIS — E1165 Type 2 diabetes mellitus with hyperglycemia: Secondary | ICD-10-CM | POA: Diagnosis not present

## 2022-07-08 DIAGNOSIS — E1142 Type 2 diabetes mellitus with diabetic polyneuropathy: Secondary | ICD-10-CM

## 2022-07-08 DIAGNOSIS — I10 Essential (primary) hypertension: Secondary | ICD-10-CM | POA: Diagnosis not present

## 2022-07-08 DIAGNOSIS — E782 Mixed hyperlipidemia: Secondary | ICD-10-CM | POA: Diagnosis not present

## 2022-07-08 LAB — POCT CBG (FASTING - GLUCOSE)-MANUAL ENTRY: Glucose Fasting, POC: 202 mg/dL — AB (ref 70–99)

## 2022-07-08 NOTE — Progress Notes (Signed)
Established Patient Office Visit  Subjective:  Patient ID: Henry Anderson, male    DOB: 1965-05-31  Age: 57 y.o. MRN: [id number]  Chief Complaint  Patient presents with   Follow-up    2 week F/U    Patient in office for 2 week follow up. Patient has no complaints today. Needs to bring fasting  sugars to 120 or less, increase insulin to 30 units this evening, increase by 2 units every 3 days until controlled. Patient has not heard from dermatology yet.  Blood pressure elevated today, may increase Lisinopril at next visit if it continues to be elevated. WBC elevated, recheck at next visit    No other concerns at this time.   Past Medical History:  Diagnosis Date   Diabetes mellitus without complication (HCC)    Emphysema of lung (HCC)    Hypertension    Stroke [facility])     Past Surgical History:  Procedure Laterality Date   FOOT SURGERY Right    KNEE SURGERY Right    SHOULDER SURGERY Left     Social History   Socioeconomic History   Marital status: Married    Spouse name: Not on file   Number of children: Not on file   Years of education: Not on file   Highest education level: Not on file  Occupational History   Not on file  Tobacco Use   Smoking status: Every Day    Packs/day: 1.50    Years: 30.00    Additional pack years: 0.00    Total pack years: 45.00    Types: Cigarettes   Smokeless tobacco: Former    Types: Chew   Tobacco comments:    Patient has cut down to ~1 ppd since having stroke. Gave [place] Quit info. Patient down to 1/2 ppd  Vaping Use   Vaping Use: Never used  Substance and Sexual Activity   Alcohol use: Yes    Comment: occasionally 1-2 beers   Drug use: Never   Sexual activity: Not on file  Other Topics Concern   Not on file  Social History Narrative   Not on file   Social Determinants of Health   Financial Resource Strain: High Risk (11/23/2021)   Overall Financial Resource Strain (CARDIA)    Difficulty of Paying Living Expenses:  Very hard  Food Insecurity: Food Insecurity Present (11/23/2021)   Hunger Vital Sign    Worried About Running Out of Food in the Last Year: Often true    Ran Out of Food in the Last Year: Often true  Transportation Needs: No Transportation Needs (11/23/2021)   PRAPARE - Administrator, Civil Service (Medical): No    Lack of Transportation (Non-Medical): No  Physical Activity: Inactive (11/23/2021)   Exercise Vital Sign    Days of Exercise per Week: 0 days    Minutes of Exercise per Session: 0 min  Stress: Stress Concern Present (11/23/2021)   [employer] of Occupational Health - Occupational Stress Questionnaire    Feeling of Stress : Very much  Social Connections: Socially Isolated (11/23/2021)   Social Connection and Isolation Panel [NHANES]    Frequency of Communication with Friends and Family: Never    Frequency of Social Gatherings with Friends and Family: Never    Attends Religious Services: Never    Database administrator or Organizations: No    Attends Banker Meetings: Never    Marital Status: Married  Catering manager Violence: Not At Risk (11/23/2021)  Humiliation, Afraid, Rape, and Kick questionnaire    Fear of Current or Ex-Partner: No    Emotionally Abused: No    Physically Abused: No    Sexually Abused: No    Family History  Problem Relation Age of Onset   COPD Mother    Diabetes Father    Diabetes Sister    Diabetes Sister    COPD Maternal Grandmother    Heart attack Maternal Grandfather    Diabetes Paternal Grandmother    Hyperlipidemia Paternal Grandmother    Hypertension Paternal Grandmother    Diabetes Paternal Grandfather    Hyperlipidemia Paternal Grandfather    Hypertension Paternal Grandfather     No Known Allergies  Review of Systems  Constitutional: Negative.  Negative for chills, diaphoresis, fever, malaise/fatigue and weight loss.  HENT:  Negative for congestion, ear discharge, ear pain, hearing loss, sinus  pain and sore throat.   Eyes: Negative.  Negative for blurred vision.  Respiratory: Negative.  Negative for cough, shortness of breath and wheezing.   Cardiovascular: Negative.  Negative for chest pain, palpitations and leg swelling.  Gastrointestinal: Negative.  Negative for abdominal pain, constipation, diarrhea, heartburn, nausea and vomiting.  Genitourinary: Negative.  Negative for dysuria and flank pain.  Musculoskeletal: Negative.  Negative for joint pain and myalgias.  Skin: Negative.   Neurological: Negative.  Negative for dizziness, tingling, tremors and headaches.  Endo/Heme/Allergies: Negative.   Psychiatric/Behavioral: Negative.  Negative for depression and suicidal ideas. The patient is not nervous/anxious.        Objective:   BP (!) 140/80   Pulse 97   Ht 5\' 9"  (1.753 m)   Wt 169 lb 12.8 oz (77 kg)   SpO2 98%   BMI 25.08 kg/m   Vitals:   07/08/22 0930  BP: (!) 140/80  Pulse: 97  Height: 5\' 9"  (1.753 m)  Weight: 169 lb 12.8 oz (77 kg)  SpO2: 98%  BMI (Calculated): 25.06    Physical Exam Vitals and nursing note reviewed.  Constitutional:      General: He is not in acute distress.    Appearance: Normal appearance. He is not diaphoretic.  HENT:     Head: Normocephalic and atraumatic.     Nose: Nose normal.     Mouth/Throat:     Mouth: Mucous membranes are dry.     Pharynx: Oropharynx is clear.  Eyes:     Extraocular Movements: Extraocular movements intact.     Conjunctiva/sclera: Conjunctivae normal.     Pupils: Pupils are equal, round, and reactive to light.  Cardiovascular:     Rate and Rhythm: Normal rate and regular rhythm.     Pulses: Normal pulses.     Heart sounds: Normal heart sounds.  Pulmonary:     Effort: Pulmonary effort is normal.     Breath sounds: Normal breath sounds.  Abdominal:     General: Bowel sounds are normal.     Palpations: Abdomen is soft. There is no mass.     Tenderness: There is no right CVA tenderness, left CVA  tenderness or guarding.  Musculoskeletal:        General: Normal range of motion.     Cervical back: Normal range of motion.     Right lower leg: No edema.     Left lower leg: No edema.  Skin:    General: Skin is warm and dry.  Neurological:     General: No focal deficit present.     Mental Status: He is alert  and oriented to person, place, and time.  Psychiatric:        Mood and Affect: Mood normal.        Behavior: Behavior normal.        Judgment: Judgment normal.      Results for orders placed or performed in visit on 07/08/22  POCT CBG (Fasting - Glucose)  Result Value Ref Range   Glucose Fasting, POC 202 (A) 70 - 99 mg/dL    Recent Results (from the past 2160 hour(s))  POCT Glucose (CBG)     Status: Abnormal   Collection Time: 05/24/22 11:22 AM  Result Value Ref Range   POC Glucose 178 (A) 70 - 99 mg/dl  POCT HgB Z6X     Status: Abnormal   Collection Time: 05/24/22 11:30 AM  Result Value Ref Range   Hemoglobin A1C 8.9 (A) 4.0 - 5.6 %   HbA1c POC (<> result, manual entry)     HbA1c, POC (prediabetic range)     HbA1c, POC (controlled diabetic range)    POCT CBG (Fasting - Glucose)     Status: Abnormal   Collection Time: 06/23/22 10:34 AM  Result Value Ref Range   Glucose Fasting, POC 254 (A) 70 - 99 mg/dL  POC CREATINE & ALBUMIN,URINE     Status: None   Collection Time: 06/23/22 11:03 AM  Result Value Ref Range   Microalbumin Ur, POC 30 mg/L   Creatinine, POC 100 mg/dL   Albumin/Creatinine Ratio, Urine, POC <30   CBC With Differential     Status: Abnormal   Collection Time: 06/23/22 11:18 AM  Result Value Ref Range   WBC 15.2 (H) 3.4 - 10.8 x10E3/uL   RBC 5.51 4.14 - 5.80 x10E6/uL   Hemoglobin 16.7 13.0 - 17.7 g/dL   Hematocrit 09.6 04.5 - 51.0 %   MCV 92 79 - 97 fL   MCH 30.3 26.6 - 33.0 pg   MCHC 33.0 31.5 - 35.7 g/dL   RDW 40.9 81.1 - 91.4 %   Neutrophils 73 Not Estab. %   Lymphs 17 Not Estab. %   Monocytes 6 Not Estab. %   Eos 2 Not Estab. %    Basos 1 Not Estab. %   Neutrophils Absolute 11.2 (H) 1.4 - 7.0 x10E3/uL   Lymphocytes Absolute 2.6 0.7 - 3.1 x10E3/uL   Monocytes Absolute 1.0 (H) 0.1 - 0.9 x10E3/uL   EOS (ABSOLUTE) 0.3 0.0 - 0.4 x10E3/uL   Basophils Absolute 0.1 0.0 - 0.2 x10E3/uL   Immature Granulocytes 1 Not Estab. %   Immature Grans (Abs) 0.1 0.0 - 0.1 x10E3/uL    Comment: **Effective August 08, 2022, profile [id number] CBC/Differential**   (No Platelet) will be made non-orderable. [facility] Offers:   [id number] CBC With Differential/Platelet   CMP14+EGFR     Status: Abnormal   Collection Time: 06/23/22 11:18 AM  Result Value Ref Range   Glucose 301 (H) 70 - 99 mg/dL   BUN 21 6 - 24 mg/dL   Creatinine, Ser 2.13 0.76 - 1.27 mg/dL   eGFR 85 >08 MV/HQI/6.96   BUN/Creatinine Ratio 20 9 - 20   Sodium 138 134 - 144 mmol/L   Potassium 4.3 3.5 - 5.2 mmol/L   Chloride 93 (L) 96 - 106 mmol/L   CO2 25 20 - 29 mmol/L   Calcium 9.8 8.7 - 10.2 mg/dL   Total Protein 7.2 6.0 - 8.5 g/dL   Albumin 4.7 3.8 - 4.9 g/dL   Globulin, Total 2.5 1.5 - 4.5  g/dL   Albumin/Globulin Ratio 1.9    Bilirubin Total 0.5 0.0 - 1.2 mg/dL   Alkaline Phosphatase 98 44 - 121 IU/L   AST 22 0 - 40 IU/L   ALT 25 0 - 44 IU/L  Vitamin D (25 hydroxy)     Status: Abnormal   Collection Time: 06/23/22 11:18 AM  Result Value Ref Range   Vit D, 25-Hydroxy 15.8 (L) 30.0 - 100.0 ng/mL    Comment: Vitamin D deficiency has been defined by the Institute of Medicine and an Endocrine Society practice guideline as a level of serum 25-OH vitamin D less than 20 ng/mL (1,2). The Endocrine Society went on to further define vitamin D insufficiency as a level between 21 and 29 ng/mL (2). 1. IOM (Institute of Medicine). 2010. Dietary reference    intakes for calcium and D. [place]: The    [employer]. 2. Holick MF, Binkley [place], Bischoff-Ferrari HA, et al.    Evaluation, treatment, and prevention of vitamin D    deficiency: an Endocrine Society clinical  practice    guideline. JCEM. 2011 Jul; 96(7):1911-30.   Vitamin B12     Status: None   Collection Time: 06/23/22 11:18 AM  Result Value Ref Range   Vitamin B-12 811 232 - 1,245 pg/mL  Lipid Panel w/o Chol/HDL Ratio     Status: Abnormal   Collection Time: 06/23/22 11:18 AM  Result Value Ref Range   Cholesterol, Total 164 100 - 199 mg/dL   Triglycerides 161 (H) 0 - 149 mg/dL   HDL 34 (L) >09 mg/dL   VLDL Cholesterol Cal 53 (H) 5 - 40 mg/dL   LDL Chol Calc (NIH) 77 0 - 99 mg/dL  Hemoglobin U0A     Status: Abnormal   Collection Time: 06/23/22 11:18 AM  Result Value Ref Range   Hgb A1c MFr Bld 9.7 (H) 4.8 - 5.6 %    Comment:          Prediabetes: 5.7 - 6.4          Diabetes: >6.4          Glycemic control for adults with diabetes: <7.0    Est. average glucose Bld gHb Est-mCnc 232 mg/dL  POCT CBG (Fasting - Glucose)     Status: Abnormal   Collection Time: 07/08/22  9:36 AM  Result Value Ref Range   Glucose Fasting, POC 202 (A) 70 - 99 mg/dL      Assessment & Plan:  Patient feeling well. Blood sugar continues to be elevated. Increase insulin to 30 units this evening, increase by 2 units every 3 days until controlled. Will check on dermatology referral. Monitor blood pressure at home. Recheck WBC at next visit.   Problem List Items Addressed This Visit     Poorly controlled type 2 diabetes mellitus with peripheral neuropathy (HCC) - Primary   Relevant Orders   POCT CBG (Fasting - Glucose) (Completed)   Essential hypertension, benign   Nicotine abuse   Mixed hyperlipidemia    Return in about 2 weeks (around 07/22/2022).   Total time spent: 25 minutes  Margaretann Loveless, MD  07/08/2022   This document may have been prepared by [facility] Voice Recognition software and as such may include unintentional dictation errors.

## 2022-07-10 ENCOUNTER — Other Ambulatory Visit: Payer: Self-pay

## 2022-07-10 ENCOUNTER — Other Ambulatory Visit: Payer: Self-pay | Admitting: Internal Medicine

## 2022-07-10 DIAGNOSIS — E119 Type 2 diabetes mellitus without complications: Secondary | ICD-10-CM

## 2022-07-11 ENCOUNTER — Ambulatory Visit (INDEPENDENT_AMBULATORY_CARE_PROVIDER_SITE_OTHER): Payer: Medicaid Other

## 2022-07-11 ENCOUNTER — Other Ambulatory Visit: Payer: Self-pay

## 2022-07-11 DIAGNOSIS — Z720 Tobacco use: Secondary | ICD-10-CM

## 2022-07-11 DIAGNOSIS — E782 Mixed hyperlipidemia: Secondary | ICD-10-CM

## 2022-07-11 DIAGNOSIS — R0602 Shortness of breath: Secondary | ICD-10-CM

## 2022-07-11 DIAGNOSIS — R42 Dizziness and giddiness: Secondary | ICD-10-CM

## 2022-07-11 DIAGNOSIS — I340 Nonrheumatic mitral (valve) insufficiency: Secondary | ICD-10-CM

## 2022-07-11 DIAGNOSIS — I10 Essential (primary) hypertension: Secondary | ICD-10-CM

## 2022-07-11 DIAGNOSIS — I639 Cerebral infarction, unspecified: Secondary | ICD-10-CM

## 2022-07-11 DIAGNOSIS — I4711 Inappropriate sinus tachycardia, so stated: Secondary | ICD-10-CM

## 2022-07-11 DIAGNOSIS — F17200 Nicotine dependence, unspecified, uncomplicated: Secondary | ICD-10-CM

## 2022-07-11 DIAGNOSIS — E1142 Type 2 diabetes mellitus with diabetic polyneuropathy: Secondary | ICD-10-CM

## 2022-07-11 MED ORDER — BLOOD GLUCOSE MONITOR KIT
PACK | 0 refills | Status: AC
  Filled 2022-07-11: qty 1, 30d supply, fill #0

## 2022-07-11 MED FILL — Glucose Blood Test Strip: 25 days supply | Qty: 100 | Fill #0 | Status: AC

## 2022-07-11 MED FILL — Lancets: 25 days supply | Qty: 100 | Fill #0 | Status: AC

## 2022-07-11 MED FILL — Metformin HCl Tab ER 24HR 500 MG: ORAL | 90 days supply | Qty: 180 | Fill #0 | Status: CN

## 2022-07-12 ENCOUNTER — Other Ambulatory Visit: Payer: Self-pay

## 2022-07-13 ENCOUNTER — Other Ambulatory Visit: Payer: Self-pay

## 2022-07-18 ENCOUNTER — Other Ambulatory Visit: Payer: Medicaid Other

## 2022-07-20 ENCOUNTER — Other Ambulatory Visit: Payer: Self-pay

## 2022-07-20 MED FILL — Metformin HCl Tab ER 24HR 500 MG: ORAL | 90 days supply | Qty: 180 | Fill #0 | Status: AC

## 2022-07-21 ENCOUNTER — Ambulatory Visit: Payer: Medicaid Other | Admitting: Cardiovascular Disease

## 2022-07-22 ENCOUNTER — Encounter: Payer: Self-pay | Admitting: Internal Medicine

## 2022-07-22 ENCOUNTER — Ambulatory Visit: Payer: Medicaid Other | Admitting: Internal Medicine

## 2022-07-22 VITALS — BP 112/72 | HR 101 | Ht 69.0 in | Wt 172.4 lb

## 2022-07-22 DIAGNOSIS — D72829 Elevated white blood cell count, unspecified: Secondary | ICD-10-CM | POA: Diagnosis not present

## 2022-07-22 DIAGNOSIS — F17210 Nicotine dependence, cigarettes, uncomplicated: Secondary | ICD-10-CM

## 2022-07-22 DIAGNOSIS — E1165 Type 2 diabetes mellitus with hyperglycemia: Secondary | ICD-10-CM | POA: Diagnosis not present

## 2022-07-22 DIAGNOSIS — E782 Mixed hyperlipidemia: Secondary | ICD-10-CM | POA: Diagnosis not present

## 2022-07-22 DIAGNOSIS — I10 Essential (primary) hypertension: Secondary | ICD-10-CM

## 2022-07-22 DIAGNOSIS — E1142 Type 2 diabetes mellitus with diabetic polyneuropathy: Secondary | ICD-10-CM

## 2022-07-22 DIAGNOSIS — E559 Vitamin D deficiency, unspecified: Secondary | ICD-10-CM

## 2022-07-22 LAB — POCT CBG (FASTING - GLUCOSE)-MANUAL ENTRY: Glucose Fasting, POC: 127 mg/dL — AB (ref 70–99)

## 2022-07-22 NOTE — Progress Notes (Signed)
Established Patient Office Visit  Subjective:  Patient ID: Henry Anderson, male    DOB: Dec 19, 1965  Age: 57 y.o. MRN: [id number]  Chief Complaint  Patient presents with   Follow-up    2 week follow up    Patient comes in for his follow-up today.  He is feeling well well and has no new complaints.  His fasting blood sugars are looking much better now.  Currently he is on now 36 units of Basaglar.  Patient advised to continue using this for dose now and also to monitor his blood sugars.  May need to increase it further if needed. Blood pressure is also under better control. Needs repeat WBC count today. Needs a CT chest for nicotine dependence.    No other concerns at this time.   Past Medical History:  Diagnosis Date   Diabetes mellitus without complication (HCC)    Emphysema of lung (HCC)    Hypertension    Stroke [facility])     Past Surgical History:  Procedure Laterality Date   FOOT SURGERY Right    KNEE SURGERY Right    SHOULDER SURGERY Left     Social History   Socioeconomic History   Marital status: Married    Spouse name: Not on file   Number of children: Not on file   Years of education: Not on file   Highest education level: Not on file  Occupational History   Not on file  Tobacco Use   Smoking status: Every Day    Current packs/day: 1.50    Average packs/day: 1.5 packs/day for 30.0 years (45.0 ttl pk-yrs)    Types: Cigarettes   Smokeless tobacco: Former    Types: Chew   Tobacco comments:    Patient has cut down to ~1 ppd since having stroke. Gave [place] Quit info. Patient down to 1/2 ppd  Vaping Use   Vaping status: Never Used  Substance and Sexual Activity   Alcohol use: Yes    Comment: occasionally 1-2 beers   Drug use: Never   Sexual activity: Not on file  Other Topics Concern   Not on file  Social History Narrative   Not on file   Social Determinants of Health   Financial Resource Strain: High Risk (11/23/2021)   Overall Financial Resource  Strain (CARDIA)    Difficulty of Paying Living Expenses: Very hard  Food Insecurity: Food Insecurity Present (11/23/2021)   Hunger Vital Sign    Worried About Running Out of Food in the Last Year: Often true    Ran Out of Food in the Last Year: Often true  Transportation Needs: No Transportation Needs (11/23/2021)   PRAPARE - Administrator, Civil Service (Medical): No    Lack of Transportation (Non-Medical): No  Physical Activity: Inactive (11/23/2021)   Exercise Vital Sign    Days of Exercise per Week: 0 days    Minutes of Exercise per Session: 0 min  Stress: Stress Concern Present (11/23/2021)   [employer] of Occupational Health - Occupational Stress Questionnaire    Feeling of Stress : Very much  Social Connections: Socially Isolated (11/23/2021)   Social Connection and Isolation Panel [NHANES]    Frequency of Communication with Friends and Family: Never    Frequency of Social Gatherings with Friends and Family: Never    Attends Religious Services: Never    Database administrator or Organizations: No    Attends Banker Meetings: Never    Marital Status:  Married  Intimate Partner Violence: Not At Risk (11/23/2021)   Humiliation, Afraid, Rape, and Kick questionnaire    Fear of Current or Ex-Partner: No    Emotionally Abused: No    Physically Abused: No    Sexually Abused: No    Family History  Problem Relation Age of Onset   COPD Mother    Diabetes Father    Diabetes Sister    Diabetes Sister    COPD Maternal Grandmother    Heart attack Maternal Grandfather    Diabetes Paternal Grandmother    Hyperlipidemia Paternal Grandmother    Hypertension Paternal Grandmother    Diabetes Paternal Grandfather    Hyperlipidemia Paternal Grandfather    Hypertension Paternal Grandfather     No Known Allergies  Review of Systems  Constitutional: Negative.   HENT: Negative.    Eyes: Negative.   Respiratory: Negative.  Negative for cough and  shortness of breath.   Cardiovascular: Negative.  Negative for chest pain, palpitations and leg swelling.  Gastrointestinal: Negative.  Negative for abdominal pain, constipation, diarrhea, heartburn, nausea and vomiting.  Genitourinary: Negative.  Negative for dysuria and flank pain.  Musculoskeletal: Negative.  Negative for joint pain and myalgias.  Skin: Negative.   Neurological: Negative.  Negative for dizziness and headaches.  Endo/Heme/Allergies: Negative.   Psychiatric/Behavioral: Negative.  Negative for depression and suicidal ideas. The patient is not nervous/anxious.        Objective:   BP 112/72   Pulse (!) 101   Ht 5\' 9"  (1.753 m)   Wt 172 lb 6.4 oz (78.2 kg)   SpO2 96%   BMI 25.46 kg/m   Vitals:   07/22/22 1057  BP: 112/72  Pulse: (!) 101  Height: 5\' 9"  (1.753 m)  Weight: 172 lb 6.4 oz (78.2 kg)  SpO2: 96%  BMI (Calculated): 25.45    Physical Exam Vitals and nursing note reviewed.  Constitutional:      Appearance: Normal appearance.  HENT:     Head: Normocephalic and atraumatic.     Nose: Nose normal.     Mouth/Throat:     Mouth: Mucous membranes are moist.     Pharynx: Oropharynx is clear.  Eyes:     Conjunctiva/sclera: Conjunctivae normal.     Pupils: Pupils are equal, round, and reactive to light.  Cardiovascular:     Rate and Rhythm: Normal rate and regular rhythm.     Pulses: Normal pulses.     Heart sounds: Normal heart sounds.  Pulmonary:     Effort: Pulmonary effort is normal.     Breath sounds: Normal breath sounds.  Abdominal:     General: Bowel sounds are normal.     Palpations: Abdomen is soft.  Musculoskeletal:        General: Normal range of motion.     Cervical back: Normal range of motion.  Skin:    General: Skin is warm and dry.  Neurological:     General: No focal deficit present.     Mental Status: He is alert and oriented to person, place, and time.  Psychiatric:        Mood and Affect: Mood normal.        Behavior:  Behavior normal.        Judgment: Judgment normal.      Results for orders placed or performed in visit on 07/22/22  POCT CBG (Fasting - Glucose)  Result Value Ref Range   Glucose Fasting, POC 127 (A) 70 - 99 mg/dL  Recent Results (from the past 2160 hour(s))  POCT Glucose (CBG)     Status: Abnormal   Collection Time: 05/24/22 11:22 AM  Result Value Ref Range   POC Glucose 178 (A) 70 - 99 mg/dl  POCT HgB Z6X     Status: Abnormal   Collection Time: 05/24/22 11:30 AM  Result Value Ref Range   Hemoglobin A1C 8.9 (A) 4.0 - 5.6 %   HbA1c POC (<> result, manual entry)     HbA1c, POC (prediabetic range)     HbA1c, POC (controlled diabetic range)    POCT CBG (Fasting - Glucose)     Status: Abnormal   Collection Time: 06/23/22 10:34 AM  Result Value Ref Range   Glucose Fasting, POC 254 (A) 70 - 99 mg/dL  POC CREATINE & ALBUMIN,URINE     Status: None   Collection Time: 06/23/22 11:03 AM  Result Value Ref Range   Microalbumin Ur, POC 30 mg/L   Creatinine, POC 100 mg/dL   Albumin/Creatinine Ratio, Urine, POC <30   CBC With Differential     Status: Abnormal   Collection Time: 06/23/22 11:18 AM  Result Value Ref Range   WBC 15.2 (H) 3.4 - 10.8 x10E3/uL   RBC 5.51 4.14 - 5.80 x10E6/uL   Hemoglobin 16.7 13.0 - 17.7 g/dL   Hematocrit 09.6 04.5 - 51.0 %   MCV 92 79 - 97 fL   MCH 30.3 26.6 - 33.0 pg   MCHC 33.0 31.5 - 35.7 g/dL   RDW 40.9 81.1 - 91.4 %   Neutrophils 73 Not Estab. %   Lymphs 17 Not Estab. %   Monocytes 6 Not Estab. %   Eos 2 Not Estab. %   Basos 1 Not Estab. %   Neutrophils Absolute 11.2 (H) 1.4 - 7.0 x10E3/uL   Lymphocytes Absolute 2.6 0.7 - 3.1 x10E3/uL   Monocytes Absolute 1.0 (H) 0.1 - 0.9 x10E3/uL   EOS (ABSOLUTE) 0.3 0.0 - 0.4 x10E3/uL   Basophils Absolute 0.1 0.0 - 0.2 x10E3/uL   Immature Granulocytes 1 Not Estab. %   Immature Grans (Abs) 0.1 0.0 - 0.1 x10E3/uL    Comment: **Effective August 08, 2022, profile [id number] CBC/Differential**   (No Platelet) will  be made non-orderable. [facility] Offers:   [id number] CBC With Differential/Platelet   CMP14+EGFR     Status: Abnormal   Collection Time: 06/23/22 11:18 AM  Result Value Ref Range   Glucose 301 (H) 70 - 99 mg/dL   BUN 21 6 - 24 mg/dL   Creatinine, Ser 2.13 0.76 - 1.27 mg/dL   eGFR 85 >08 MV/HQI/6.96   BUN/Creatinine Ratio 20 9 - 20   Sodium 138 134 - 144 mmol/L   Potassium 4.3 3.5 - 5.2 mmol/L   Chloride 93 (L) 96 - 106 mmol/L   CO2 25 20 - 29 mmol/L   Calcium 9.8 8.7 - 10.2 mg/dL   Total Protein 7.2 6.0 - 8.5 g/dL   Albumin 4.7 3.8 - 4.9 g/dL   Globulin, Total 2.5 1.5 - 4.5 g/dL   Albumin/Globulin Ratio 1.9    Bilirubin Total 0.5 0.0 - 1.2 mg/dL   Alkaline Phosphatase 98 44 - 121 IU/L   AST 22 0 - 40 IU/L   ALT 25 0 - 44 IU/L  Vitamin D (25 hydroxy)     Status: Abnormal   Collection Time: 06/23/22 11:18 AM  Result Value Ref Range   Vit D, 25-Hydroxy 15.8 (L) 30.0 - 100.0 ng/mL    Comment: Vitamin D  deficiency has been defined by the Institute of Medicine and an Endocrine Society practice guideline as a level of serum 25-OH vitamin D less than 20 ng/mL (1,2). The Endocrine Society went on to further define vitamin D insufficiency as a level between 21 and 29 ng/mL (2). 1. IOM (Institute of Medicine). 2010. Dietary reference    intakes for calcium and D. [place]: The    [employer]. 2. Holick MF, Binkley [place], Bischoff-Ferrari HA, et al.    Evaluation, treatment, and prevention of vitamin D    deficiency: an Endocrine Society clinical practice    guideline. JCEM. 2011 Jul; 96(7):1911-30.   Vitamin B12     Status: None   Collection Time: 06/23/22 11:18 AM  Result Value Ref Range   Vitamin B-12 811 232 - 1,245 pg/mL  Lipid Panel w/o Chol/HDL Ratio     Status: Abnormal   Collection Time: 06/23/22 11:18 AM  Result Value Ref Range   Cholesterol, Total 164 100 - 199 mg/dL   Triglycerides 161 (H) 0 - 149 mg/dL   HDL 34 (L) >09 mg/dL   VLDL Cholesterol Cal 53 (H)  5 - 40 mg/dL   LDL Chol Calc (NIH) 77 0 - 99 mg/dL  Hemoglobin U0A     Status: Abnormal   Collection Time: 06/23/22 11:18 AM  Result Value Ref Range   Hgb A1c MFr Bld 9.7 (H) 4.8 - 5.6 %    Comment:          Prediabetes: 5.7 - 6.4          Diabetes: >6.4          Glycemic control for adults with diabetes: <7.0    Est. average glucose Bld gHb Est-mCnc 232 mg/dL  POCT CBG (Fasting - Glucose)     Status: Abnormal   Collection Time: 07/08/22  9:36 AM  Result Value Ref Range   Glucose Fasting, POC 202 (A) 70 - 99 mg/dL  POCT CBG (Fasting - Glucose)     Status: Abnormal   Collection Time: 07/22/22 11:02 AM  Result Value Ref Range   Glucose Fasting, POC 127 (A) 70 - 99 mg/dL      Assessment & Plan:  Continue all medications as such.  Monitor blood pressure and sugars.  Check WBC today. Problem List Items Addressed This Visit     Poorly controlled type 2 diabetes mellitus with peripheral neuropathy (HCC) - Primary   Relevant Orders   POCT CBG (Fasting - Glucose) (Completed)   Essential hypertension, benign   Mixed hyperlipidemia   Vitamin D deficiency   Cigarette nicotine dependence without complication   Relevant Orders   CT CHEST WO CONTRAST   Other Visit Diagnoses     Leukocytosis, unspecified type       Relevant Orders   CBC With Differential       Return in about 2 months (around 09/22/2022).   Total time spent: 30 minutes  Margaretann Loveless, MD  07/22/2022   This document may have been prepared by [facility] Voice Recognition software and as such may include unintentional dictation errors.

## 2022-07-23 LAB — CBC WITH DIFFERENTIAL
Basophils Absolute: 0.1 10*3/uL (ref 0.0–0.2)
Basos: 1 %
EOS (ABSOLUTE): 0.2 10*3/uL (ref 0.0–0.4)
Eos: 1 %
Hematocrit: 44.7 % (ref 37.5–51.0)
Hemoglobin: 15.1 g/dL (ref 13.0–17.7)
Immature Grans (Abs): 0.1 10*3/uL (ref 0.0–0.1)
Immature Granulocytes: 1 %
Lymphocytes Absolute: 2.4 10*3/uL (ref 0.7–3.1)
Lymphs: 17 %
MCH: 29.8 pg (ref 26.6–33.0)
MCHC: 33.8 g/dL (ref 31.5–35.7)
MCV: 88 fL (ref 79–97)
Monocytes Absolute: 1 10*3/uL — ABNORMAL HIGH (ref 0.1–0.9)
Monocytes: 7 %
Neutrophils Absolute: 10.4 10*3/uL — ABNORMAL HIGH (ref 1.4–7.0)
Neutrophils: 73 %
RBC: 5.06 x10E6/uL (ref 4.14–5.80)
RDW: 12.5 % (ref 11.6–15.4)
WBC: 14.1 10*3/uL — ABNORMAL HIGH (ref 3.4–10.8)

## 2022-07-25 NOTE — Progress Notes (Signed)
Patient notified

## 2022-07-26 ENCOUNTER — Other Ambulatory Visit: Payer: Self-pay

## 2022-07-29 ENCOUNTER — Ambulatory Visit: Payer: Medicaid Other | Admitting: Cardiovascular Disease

## 2022-08-01 ENCOUNTER — Other Ambulatory Visit: Payer: Self-pay

## 2022-08-01 ENCOUNTER — Other Ambulatory Visit: Payer: Self-pay | Admitting: Internal Medicine

## 2022-08-01 DIAGNOSIS — E119 Type 2 diabetes mellitus without complications: Secondary | ICD-10-CM

## 2022-08-01 MED FILL — Insulin Glargine Soln Pen-Injector 100 Unit/ML: SUBCUTANEOUS | 41 days supply | Qty: 15 | Fill #0 | Status: AC

## 2022-08-09 ENCOUNTER — Other Ambulatory Visit: Payer: Self-pay

## 2022-08-09 ENCOUNTER — Other Ambulatory Visit: Payer: Medicaid Other

## 2022-08-09 MED FILL — Lancets: 25 days supply | Qty: 100 | Fill #1 | Status: AC

## 2022-08-10 ENCOUNTER — Other Ambulatory Visit: Payer: Self-pay

## 2022-08-12 ENCOUNTER — Other Ambulatory Visit: Payer: Self-pay

## 2022-08-15 ENCOUNTER — Ambulatory Visit (INDEPENDENT_AMBULATORY_CARE_PROVIDER_SITE_OTHER): Payer: Medicaid Other

## 2022-08-15 DIAGNOSIS — R42 Dizziness and giddiness: Secondary | ICD-10-CM

## 2022-08-15 DIAGNOSIS — F17200 Nicotine dependence, unspecified, uncomplicated: Secondary | ICD-10-CM

## 2022-08-15 DIAGNOSIS — E1165 Type 2 diabetes mellitus with hyperglycemia: Secondary | ICD-10-CM

## 2022-08-15 DIAGNOSIS — E782 Mixed hyperlipidemia: Secondary | ICD-10-CM

## 2022-08-15 DIAGNOSIS — I639 Cerebral infarction, unspecified: Secondary | ICD-10-CM

## 2022-08-15 DIAGNOSIS — E1142 Type 2 diabetes mellitus with diabetic polyneuropathy: Secondary | ICD-10-CM

## 2022-08-15 DIAGNOSIS — I6389 Other cerebral infarction: Secondary | ICD-10-CM

## 2022-08-15 DIAGNOSIS — I4711 Inappropriate sinus tachycardia, so stated: Secondary | ICD-10-CM

## 2022-08-15 DIAGNOSIS — Z720 Tobacco use: Secondary | ICD-10-CM

## 2022-08-15 DIAGNOSIS — R0602 Shortness of breath: Secondary | ICD-10-CM | POA: Diagnosis not present

## 2022-08-15 DIAGNOSIS — I10 Essential (primary) hypertension: Secondary | ICD-10-CM

## 2022-08-15 MED ORDER — TECHNETIUM TC 99M SESTAMIBI GENERIC - CARDIOLITE
10.2000 | Freq: Once | INTRAVENOUS | Status: AC | PRN
  Administered 2022-08-15: 10.2 via INTRAVENOUS

## 2022-08-15 MED ORDER — TECHNETIUM TC 99M SESTAMIBI GENERIC - CARDIOLITE
33.3000 | Freq: Once | INTRAVENOUS | Status: AC | PRN
  Administered 2022-08-15: 33.3 via INTRAVENOUS

## 2022-08-23 ENCOUNTER — Ambulatory Visit: Payer: Medicaid Other

## 2022-08-23 DIAGNOSIS — F17210 Nicotine dependence, cigarettes, uncomplicated: Secondary | ICD-10-CM | POA: Diagnosis not present

## 2022-08-25 ENCOUNTER — Other Ambulatory Visit: Payer: Self-pay

## 2022-08-25 ENCOUNTER — Other Ambulatory Visit: Payer: Self-pay | Admitting: Gerontology

## 2022-08-25 DIAGNOSIS — K219 Gastro-esophageal reflux disease without esophagitis: Secondary | ICD-10-CM

## 2022-08-25 NOTE — Progress Notes (Signed)
Patient notified

## 2022-08-29 ENCOUNTER — Other Ambulatory Visit: Payer: Self-pay | Admitting: Gerontology

## 2022-08-29 DIAGNOSIS — K219 Gastro-esophageal reflux disease without esophagitis: Secondary | ICD-10-CM

## 2022-09-02 ENCOUNTER — Other Ambulatory Visit: Payer: Self-pay

## 2022-09-05 ENCOUNTER — Other Ambulatory Visit: Payer: Self-pay

## 2022-09-07 ENCOUNTER — Other Ambulatory Visit: Payer: Self-pay | Admitting: Gerontology

## 2022-09-07 ENCOUNTER — Other Ambulatory Visit: Payer: Self-pay | Admitting: Internal Medicine

## 2022-09-07 DIAGNOSIS — I10 Essential (primary) hypertension: Secondary | ICD-10-CM

## 2022-09-07 DIAGNOSIS — E1142 Type 2 diabetes mellitus with diabetic polyneuropathy: Secondary | ICD-10-CM

## 2022-09-07 DIAGNOSIS — K219 Gastro-esophageal reflux disease without esophagitis: Secondary | ICD-10-CM

## 2022-09-07 MED FILL — Lancets: 25 days supply | Qty: 100 | Fill #2 | Status: AC

## 2022-09-07 MED FILL — Insulin Glargine Soln Pen-Injector 100 Unit/ML: SUBCUTANEOUS | 41 days supply | Qty: 15 | Fill #1 | Status: AC

## 2022-09-08 ENCOUNTER — Other Ambulatory Visit: Payer: Self-pay | Admitting: Gerontology

## 2022-09-08 ENCOUNTER — Other Ambulatory Visit: Payer: Self-pay

## 2022-09-08 ENCOUNTER — Other Ambulatory Visit: Payer: Self-pay | Admitting: Internal Medicine

## 2022-09-08 DIAGNOSIS — K219 Gastro-esophageal reflux disease without esophagitis: Secondary | ICD-10-CM

## 2022-09-08 DIAGNOSIS — I639 Cerebral infarction, unspecified: Secondary | ICD-10-CM

## 2022-09-08 MED ORDER — GABAPENTIN 100 MG PO CAPS
100.0000 mg | ORAL_CAPSULE | Freq: Every day | ORAL | 2 refills | Status: DC
  Filled 2022-09-08 – 2022-10-27 (×5): qty 90, 90d supply, fill #0
  Filled 2023-01-16: qty 90, 90d supply, fill #1
  Filled 2023-01-23 – 2023-03-16 (×7): qty 90, 90d supply, fill #2

## 2022-09-08 MED FILL — Clopidogrel Bisulfate Tab 75 MG (Base Equiv): ORAL | 90 days supply | Qty: 90 | Fill #0 | Status: AC

## 2022-09-09 ENCOUNTER — Other Ambulatory Visit: Payer: Self-pay | Admitting: Gerontology

## 2022-09-09 ENCOUNTER — Other Ambulatory Visit: Payer: Self-pay

## 2022-09-09 DIAGNOSIS — K219 Gastro-esophageal reflux disease without esophagitis: Secondary | ICD-10-CM

## 2022-09-13 ENCOUNTER — Other Ambulatory Visit: Payer: Self-pay

## 2022-09-16 ENCOUNTER — Other Ambulatory Visit: Payer: Self-pay

## 2022-09-16 ENCOUNTER — Other Ambulatory Visit: Payer: Self-pay | Admitting: Gerontology

## 2022-09-16 DIAGNOSIS — I10 Essential (primary) hypertension: Secondary | ICD-10-CM

## 2022-09-16 DIAGNOSIS — K219 Gastro-esophageal reflux disease without esophagitis: Secondary | ICD-10-CM

## 2022-09-18 ENCOUNTER — Other Ambulatory Visit: Payer: Self-pay | Admitting: Gerontology

## 2022-09-18 ENCOUNTER — Other Ambulatory Visit: Payer: Self-pay

## 2022-09-18 DIAGNOSIS — I10 Essential (primary) hypertension: Secondary | ICD-10-CM

## 2022-09-18 DIAGNOSIS — K219 Gastro-esophageal reflux disease without esophagitis: Secondary | ICD-10-CM

## 2022-09-19 ENCOUNTER — Other Ambulatory Visit: Payer: Self-pay

## 2022-09-22 ENCOUNTER — Ambulatory Visit: Payer: Medicaid Other | Admitting: Internal Medicine

## 2022-09-23 ENCOUNTER — Other Ambulatory Visit: Payer: Self-pay

## 2022-09-28 ENCOUNTER — Other Ambulatory Visit: Payer: Self-pay | Admitting: Gerontology

## 2022-09-28 ENCOUNTER — Other Ambulatory Visit: Payer: Self-pay

## 2022-09-28 DIAGNOSIS — I10 Essential (primary) hypertension: Secondary | ICD-10-CM

## 2022-09-28 DIAGNOSIS — K219 Gastro-esophageal reflux disease without esophagitis: Secondary | ICD-10-CM

## 2022-09-29 ENCOUNTER — Other Ambulatory Visit: Payer: Self-pay

## 2022-10-04 ENCOUNTER — Ambulatory Visit: Payer: Medicaid Other | Admitting: Internal Medicine

## 2022-10-04 ENCOUNTER — Other Ambulatory Visit: Payer: Self-pay | Admitting: Gerontology

## 2022-10-04 DIAGNOSIS — K219 Gastro-esophageal reflux disease without esophagitis: Secondary | ICD-10-CM

## 2022-10-04 DIAGNOSIS — I10 Essential (primary) hypertension: Secondary | ICD-10-CM

## 2022-10-05 ENCOUNTER — Other Ambulatory Visit: Payer: Self-pay

## 2022-10-07 ENCOUNTER — Other Ambulatory Visit: Payer: Self-pay | Admitting: Gerontology

## 2022-10-07 ENCOUNTER — Other Ambulatory Visit: Payer: Self-pay

## 2022-10-07 ENCOUNTER — Other Ambulatory Visit: Payer: Self-pay | Admitting: Internal Medicine

## 2022-10-07 DIAGNOSIS — I10 Essential (primary) hypertension: Secondary | ICD-10-CM

## 2022-10-07 DIAGNOSIS — K219 Gastro-esophageal reflux disease without esophagitis: Secondary | ICD-10-CM

## 2022-10-07 MED FILL — Lancets: 25 days supply | Qty: 100 | Fill #3 | Status: AC

## 2022-10-09 ENCOUNTER — Other Ambulatory Visit: Payer: Self-pay

## 2022-10-10 ENCOUNTER — Other Ambulatory Visit: Payer: Self-pay | Admitting: Internal Medicine

## 2022-10-10 ENCOUNTER — Other Ambulatory Visit: Payer: Self-pay

## 2022-10-10 DIAGNOSIS — E785 Hyperlipidemia, unspecified: Secondary | ICD-10-CM

## 2022-10-10 MED FILL — Atorvastatin Calcium Tab 80 MG (Base Equivalent): ORAL | 90 days supply | Qty: 90 | Fill #0 | Status: AC

## 2022-10-10 MED FILL — Pantoprazole Sodium EC Tab 40 MG (Base Equiv): ORAL | 90 days supply | Qty: 90 | Fill #0 | Status: AC

## 2022-10-10 MED FILL — Lisinopril Tab 20 MG: ORAL | 90 days supply | Qty: 90 | Fill #0 | Status: AC

## 2022-10-10 MED FILL — Insulin Glargine Soln Pen-Injector 100 Unit/ML: SUBCUTANEOUS | 41 days supply | Qty: 15 | Fill #2 | Status: AC

## 2022-10-10 MED FILL — Chlorthalidone Tab 25 MG: ORAL | 30 days supply | Qty: 30 | Fill #0 | Status: AC

## 2022-10-13 ENCOUNTER — Ambulatory Visit: Payer: Medicaid Other | Admitting: Internal Medicine

## 2022-10-13 ENCOUNTER — Other Ambulatory Visit: Payer: Self-pay

## 2022-10-13 ENCOUNTER — Encounter: Payer: Self-pay | Admitting: Internal Medicine

## 2022-10-13 VITALS — BP 100/70 | HR 104 | Ht 69.0 in | Wt 172.6 lb

## 2022-10-13 DIAGNOSIS — E1165 Type 2 diabetes mellitus with hyperglycemia: Secondary | ICD-10-CM

## 2022-10-13 DIAGNOSIS — E782 Mixed hyperlipidemia: Secondary | ICD-10-CM | POA: Diagnosis not present

## 2022-10-13 DIAGNOSIS — E1142 Type 2 diabetes mellitus with diabetic polyneuropathy: Secondary | ICD-10-CM | POA: Diagnosis not present

## 2022-10-13 DIAGNOSIS — Z23 Encounter for immunization: Secondary | ICD-10-CM | POA: Diagnosis not present

## 2022-10-13 DIAGNOSIS — K219 Gastro-esophageal reflux disease without esophagitis: Secondary | ICD-10-CM

## 2022-10-13 DIAGNOSIS — E559 Vitamin D deficiency, unspecified: Secondary | ICD-10-CM

## 2022-10-13 DIAGNOSIS — I10 Essential (primary) hypertension: Secondary | ICD-10-CM

## 2022-10-13 LAB — POCT CBG (FASTING - GLUCOSE)-MANUAL ENTRY: Glucose Fasting, POC: 160 mg/dL — AB (ref 70–99)

## 2022-10-13 MED FILL — Metformin HCl Tab ER 24HR 500 MG: ORAL | 90 days supply | Qty: 180 | Fill #1 | Status: CN

## 2022-10-13 NOTE — Progress Notes (Signed)
Established Patient Office Visit  Subjective:  Patient ID: Henry Anderson, male    DOB: 1965-08-28  Age: 57 y.o. MRN: [id number]  Chief Complaint  Patient presents with   Follow-up    2 month follow up    Patient is here for his follow-up.  He is feeling well has no complaints whatsoever.  He is taking all his medications as prescribed.  And has also cut back on his cigarette smoking.  Blood sugars at home are looking better but will get fasting blood work today. Also needs a flu shot.    No other concerns at this time.   Past Medical History:  Diagnosis Date   Diabetes mellitus without complication (HCC)    Emphysema of lung (HCC)    Hypertension    Stroke [facility])     Past Surgical History:  Procedure Laterality Date   FOOT SURGERY Right    KNEE SURGERY Right    SHOULDER SURGERY Left     Social History   Socioeconomic History   Marital status: Married    Spouse name: Not on file   Number of children: Not on file   Years of education: Not on file   Highest education level: Not on file  Occupational History   Not on file  Tobacco Use   Smoking status: Every Day    Current packs/day: 1.50    Average packs/day: 1.5 packs/day for 30.0 years (45.0 ttl pk-yrs)    Types: Cigarettes   Smokeless tobacco: Former    Types: Chew   Tobacco comments:    Patient has cut down to ~1 ppd since having stroke. Gave [place] Quit info. Patient down to 1/2 ppd  Vaping Use   Vaping status: Never Used  Substance and Sexual Activity   Alcohol use: Yes    Comment: occasionally 1-2 beers   Drug use: Never   Sexual activity: Not on file  Other Topics Concern   Not on file  Social History Narrative   Not on file   Social Determinants of Health   Financial Resource Strain: High Risk (11/23/2021)   Overall Financial Resource Strain (CARDIA)    Difficulty of Paying Living Expenses: Very hard  Food Insecurity: Food Insecurity Present (11/23/2021)   Hunger Vital Sign    Worried About  Running Out of Food in the Last Year: Often true    Ran Out of Food in the Last Year: Often true  Transportation Needs: No Transportation Needs (11/23/2021)   PRAPARE - Administrator, Civil Service (Medical): No    Lack of Transportation (Non-Medical): No  Physical Activity: Inactive (11/23/2021)   Exercise Vital Sign    Days of Exercise per Week: 0 days    Minutes of Exercise per Session: 0 min  Stress: Stress Concern Present (11/23/2021)   [employer] of Occupational Health - Occupational Stress Questionnaire    Feeling of Stress : Very much  Social Connections: Socially Isolated (11/23/2021)   Social Connection and Isolation Panel [NHANES]    Frequency of Communication with Friends and Family: Never    Frequency of Social Gatherings with Friends and Family: Never    Attends Religious Services: Never    Database administrator or Organizations: No    Attends Banker Meetings: Never    Marital Status: Married  Catering manager Violence: Not At Risk (11/23/2021)   Humiliation, Afraid, Rape, and Kick questionnaire    Fear of Current or Ex-Partner: No  Emotionally Abused: No    Physically Abused: No    Sexually Abused: No    Family History  Problem Relation Age of Onset   COPD Mother    Diabetes Father    Diabetes Sister    Diabetes Sister    COPD Maternal Grandmother    Heart attack Maternal Grandfather    Diabetes Paternal Grandmother    Hyperlipidemia Paternal Grandmother    Hypertension Paternal Grandmother    Diabetes Paternal Grandfather    Hyperlipidemia Paternal Grandfather    Hypertension Paternal Grandfather     No Known Allergies  Review of Systems  Constitutional: Negative.  Negative for chills, fever, malaise/fatigue and weight loss.  HENT: Negative.    Eyes: Negative.   Respiratory: Negative.  Negative for cough and shortness of breath.   Cardiovascular: Negative.  Negative for chest pain, palpitations and leg swelling.   Gastrointestinal: Negative.  Negative for abdominal pain, constipation, diarrhea, heartburn, nausea and vomiting.  Genitourinary: Negative.  Negative for dysuria and flank pain.  Musculoskeletal: Negative.  Negative for joint pain and myalgias.  Skin: Negative.   Neurological: Negative.  Negative for dizziness and headaches.  Endo/Heme/Allergies: Negative.   Psychiatric/Behavioral: Negative.  Negative for depression and suicidal ideas. The patient is not nervous/anxious.        Objective:   BP 100/70   Pulse (!) 104   Ht 5\' 9"  (1.753 m)   Wt 172 lb 9.6 oz (78.3 kg)   SpO2 95%   BMI 25.49 kg/m   Vitals:   10/13/22 1010  BP: 100/70  Pulse: (!) 104  Height: 5\' 9"  (1.753 m)  Weight: 172 lb 9.6 oz (78.3 kg)  SpO2: 95%  BMI (Calculated): 25.48    Physical Exam Vitals and nursing note reviewed.  Constitutional:      General: He is not in acute distress.    Appearance: Normal appearance.  HENT:     Head: Normocephalic and atraumatic.     Nose: Nose normal.     Mouth/Throat:     Mouth: Mucous membranes are moist.     Pharynx: Oropharynx is clear.  Eyes:     Conjunctiva/sclera: Conjunctivae normal.     Pupils: Pupils are equal, round, and reactive to light.  Cardiovascular:     Rate and Rhythm: Normal rate and regular rhythm.     Pulses: Normal pulses.     Heart sounds: Normal heart sounds.  Pulmonary:     Effort: Pulmonary effort is normal.     Breath sounds: Normal breath sounds. No wheezing, rhonchi or rales.  Abdominal:     General: Bowel sounds are normal.     Palpations: Abdomen is soft. There is no mass.     Tenderness: There is no abdominal tenderness. There is no right CVA tenderness, left CVA tenderness, guarding or rebound.     Hernia: No hernia is present.  Musculoskeletal:        General: Normal range of motion.     Cervical back: Normal range of motion.     Right lower leg: No edema.     Left lower leg: No edema.  Skin:    General: Skin is warm and  dry.     Findings: No rash.  Neurological:     General: No focal deficit present.     Mental Status: He is alert and oriented to person, place, and time.  Psychiatric:        Mood and Affect: Mood normal.  Behavior: Behavior normal.        Judgment: Judgment normal.      Results for orders placed or performed in visit on 10/13/22  POCT CBG (Fasting - Glucose)  Result Value Ref Range   Glucose Fasting, POC 160 (A) 70 - 99 mg/dL        Assessment & Plan:  Continue all medications. Fasting labs today. Flu shot. Problem List Items Addressed This Visit     Poorly controlled type 2 diabetes mellitus with peripheral neuropathy (HCC) - Primary   Relevant Orders   POCT CBG (Fasting - Glucose) (Completed)   Hemoglobin A1c   Essential hypertension, benign   Relevant Orders   CMP14+EGFR   GERD (gastroesophageal reflux disease)   Relevant Orders   CBC with Diff   Mixed hyperlipidemia   Relevant Orders   Lipid Profile   Vitamin D deficiency   Relevant Orders   Vitamin D (25 hydroxy)   Other Visit Diagnoses     Need for immunization against influenza       Relevant Orders   Flu Vaccine Trivalent High Dose (Fluad) (Completed)       Return in about 3 months (around 01/13/2023).   Total time spent: 30 minutes  Margaretann Loveless, MD  10/13/2022   This document may have been prepared by [facility] Voice Recognition software and as such may include unintentional dictation errors.

## 2022-10-14 ENCOUNTER — Other Ambulatory Visit: Payer: Self-pay | Admitting: Internal Medicine

## 2022-10-14 DIAGNOSIS — D72828 Other elevated white blood cell count: Secondary | ICD-10-CM

## 2022-10-14 LAB — CBC WITH DIFFERENTIAL/PLATELET
Basophils Absolute: 0.1 10*3/uL (ref 0.0–0.2)
Basos: 1 %
EOS (ABSOLUTE): 0.3 10*3/uL (ref 0.0–0.4)
Eos: 3 %
Hematocrit: 48 % (ref 37.5–51.0)
Hemoglobin: 15.9 g/dL (ref 13.0–17.7)
Immature Grans (Abs): 0 10*3/uL (ref 0.0–0.1)
Immature Granulocytes: 0 %
Lymphocytes Absolute: 2.5 10*3/uL (ref 0.7–3.1)
Lymphs: 21 %
MCH: 29.7 pg (ref 26.6–33.0)
MCHC: 33.1 g/dL (ref 31.5–35.7)
MCV: 90 fL (ref 79–97)
Monocytes Absolute: 0.8 10*3/uL (ref 0.1–0.9)
Monocytes: 7 %
Neutrophils Absolute: 8.2 10*3/uL — ABNORMAL HIGH (ref 1.4–7.0)
Neutrophils: 68 %
Platelets: 286 10*3/uL (ref 150–450)
RBC: 5.36 x10E6/uL (ref 4.14–5.80)
RDW: 12.6 % (ref 11.6–15.4)
WBC: 12 10*3/uL — ABNORMAL HIGH (ref 3.4–10.8)

## 2022-10-14 LAB — LIPID PANEL
Chol/HDL Ratio: 3.9 {ratio} (ref 0.0–5.0)
Cholesterol, Total: 116 mg/dL (ref 100–199)
HDL: 30 mg/dL — ABNORMAL LOW (ref >39)
LDL Chol Calc (NIH): 63 mg/dL (ref 0–99)
Triglycerides: 129 mg/dL (ref 0–149)
VLDL Cholesterol Cal: 23 mg/dL (ref 5–40)

## 2022-10-14 LAB — CMP14+EGFR
ALT: 21 [IU]/L (ref 0–44)
AST: 12 [IU]/L (ref 0–40)
Albumin: 4.4 g/dL (ref 3.8–4.9)
Alkaline Phosphatase: 80 [IU]/L (ref 44–121)
BUN/Creatinine Ratio: 14 (ref 9–20)
BUN: 12 mg/dL (ref 6–24)
Bilirubin Total: 0.3 mg/dL (ref 0.0–1.2)
CO2: 25 mmol/L (ref 20–29)
Calcium: 9.7 mg/dL (ref 8.7–10.2)
Chloride: 97 mmol/L (ref 96–106)
Creatinine, Ser: 0.83 mg/dL (ref 0.76–1.27)
Globulin, Total: 2.6 g/dL (ref 1.5–4.5)
Glucose: 170 mg/dL — ABNORMAL HIGH (ref 70–99)
Potassium: 4.2 mmol/L (ref 3.5–5.2)
Sodium: 140 mmol/L (ref 134–144)
Total Protein: 7 g/dL (ref 6.0–8.5)
eGFR: 103 mL/min/{1.73_m2} (ref >59)

## 2022-10-14 LAB — HEMOGLOBIN A1C
Est. average glucose Bld gHb Est-mCnc: 186 mg/dL
Hgb A1c MFr Bld: 8.1 % — ABNORMAL HIGH (ref 4.8–5.6)

## 2022-10-14 LAB — VITAMIN D 25 HYDROXY (VIT D DEFICIENCY, FRACTURES): Vit D, 25-Hydroxy: 62.6 ng/mL (ref 30.0–100.0)

## 2022-10-17 ENCOUNTER — Encounter: Payer: Self-pay | Admitting: Internal Medicine

## 2022-10-24 ENCOUNTER — Other Ambulatory Visit: Payer: Self-pay

## 2022-10-27 ENCOUNTER — Inpatient Hospital Stay: Payer: BLUE CROSS/BLUE SHIELD | Attending: Oncology | Admitting: Oncology

## 2022-10-27 ENCOUNTER — Other Ambulatory Visit: Payer: Self-pay

## 2022-10-27 ENCOUNTER — Inpatient Hospital Stay: Payer: BLUE CROSS/BLUE SHIELD

## 2022-10-27 ENCOUNTER — Encounter: Payer: Self-pay | Admitting: Oncology

## 2022-10-27 VITALS — BP 112/83 | HR 100 | Temp 98.8°F | Resp 18 | Ht 69.0 in | Wt 172.0 lb

## 2022-10-27 DIAGNOSIS — E119 Type 2 diabetes mellitus without complications: Secondary | ICD-10-CM | POA: Diagnosis not present

## 2022-10-27 DIAGNOSIS — R350 Frequency of micturition: Secondary | ICD-10-CM | POA: Diagnosis not present

## 2022-10-27 DIAGNOSIS — Z833 Family history of diabetes mellitus: Secondary | ICD-10-CM

## 2022-10-27 DIAGNOSIS — Z83438 Family history of other disorder of lipoprotein metabolism and other lipidemia: Secondary | ICD-10-CM

## 2022-10-27 DIAGNOSIS — I10 Essential (primary) hypertension: Secondary | ICD-10-CM

## 2022-10-27 DIAGNOSIS — J439 Emphysema, unspecified: Secondary | ICD-10-CM

## 2022-10-27 DIAGNOSIS — Z8673 Personal history of transient ischemic attack (TIA), and cerebral infarction without residual deficits: Secondary | ICD-10-CM

## 2022-10-27 DIAGNOSIS — D72829 Elevated white blood cell count, unspecified: Secondary | ICD-10-CM

## 2022-10-27 DIAGNOSIS — F17210 Nicotine dependence, cigarettes, uncomplicated: Secondary | ICD-10-CM | POA: Diagnosis not present

## 2022-10-27 DIAGNOSIS — R351 Nocturia: Secondary | ICD-10-CM

## 2022-10-27 DIAGNOSIS — Z8249 Family history of ischemic heart disease and other diseases of the circulatory system: Secondary | ICD-10-CM | POA: Diagnosis not present

## 2022-10-27 DIAGNOSIS — R339 Retention of urine, unspecified: Secondary | ICD-10-CM | POA: Diagnosis not present

## 2022-10-27 DIAGNOSIS — Z7902 Long term (current) use of antithrombotics/antiplatelets: Secondary | ICD-10-CM | POA: Diagnosis not present

## 2022-10-27 DIAGNOSIS — Z79899 Other long term (current) drug therapy: Secondary | ICD-10-CM

## 2022-10-27 DIAGNOSIS — Z825 Family history of asthma and other chronic lower respiratory diseases: Secondary | ICD-10-CM

## 2022-10-27 LAB — CBC WITH DIFFERENTIAL/PLATELET
Abs Immature Granulocytes: 0.07 10*3/uL (ref 0.00–0.07)
Basophils Absolute: 0.1 10*3/uL (ref 0.0–0.1)
Basophils Relative: 1 %
Eosinophils Absolute: 0.4 10*3/uL (ref 0.0–0.5)
Eosinophils Relative: 3 %
HCT: 44.9 % (ref 39.0–52.0)
Hemoglobin: 15.4 g/dL (ref 13.0–17.0)
Immature Granulocytes: 1 %
Lymphocytes Relative: 22 %
Lymphs Abs: 3 10*3/uL (ref 0.7–4.0)
MCH: 29.6 pg (ref 26.0–34.0)
MCHC: 34.3 g/dL (ref 30.0–36.0)
MCV: 86.2 fL (ref 80.0–100.0)
Monocytes Absolute: 0.7 10*3/uL (ref 0.1–1.0)
Monocytes Relative: 5 %
Neutro Abs: 9.5 10*3/uL — ABNORMAL HIGH (ref 1.7–7.7)
Neutrophils Relative %: 68 %
Platelets: 288 10*3/uL (ref 150–400)
RBC: 5.21 MIL/uL (ref 4.22–5.81)
RDW: 12.7 % (ref 11.5–15.5)
WBC: 13.8 10*3/uL — ABNORMAL HIGH (ref 4.0–10.5)
nRBC: 0 % (ref 0.0–0.2)

## 2022-10-27 LAB — IRON AND TIBC
Iron: 67 ug/dL (ref 45–182)
Saturation Ratios: 18 % (ref 17.9–39.5)
TIBC: 377 ug/dL (ref 250–450)
UIBC: 310 ug/dL

## 2022-10-27 LAB — FERRITIN: Ferritin: 168 ng/mL (ref 24–336)

## 2022-10-27 LAB — FOLATE: Folate: 15.2 ng/mL (ref >5.9)

## 2022-10-27 MED FILL — Metformin HCl Tab ER 24HR 500 MG: ORAL | 90 days supply | Qty: 180 | Fill #1 | Status: AC

## 2022-10-27 NOTE — Progress Notes (Signed)
[facility]  Telephone:([phone] Fax:(336) [phone]  ID: XERXES AGRUSA OB: 04/18/1965  MR#: [id number]  OVF#:[id number]  Patient Care Team: Margaretann Loveless, MD as PCP - General (Internal Medicine)  CHIEF COMPLAINT: Leukocytosis.  INTERVAL HISTORY: Patient is a 57 year old male who was noted to have a persistently elevated white blood cell count on routine blood work.  He currently feels well and is asymptomatic.  He denies any new medications.  He denies any recent fevers or illnesses.  He has a good appetite and denies weight loss.  He does not complain of any weakness or fatigue.  He has no chest pain, shortness of breath, cough, or hemoptysis.  He denies any nausea, vomiting, constipation, or diarrhea.  He does admit to urinary frequency, incomplete bladder emptying, and nocturia.  Patient offers no specific complaints today.  REVIEW OF SYSTEMS:   Review of Systems  Constitutional: Negative.  Negative for fever, malaise/fatigue and weight loss.  Respiratory: Negative.  Negative for cough, hemoptysis and shortness of breath.   Cardiovascular: Negative.  Negative for chest pain and leg swelling.  Gastrointestinal: Negative.  Negative for abdominal pain.  Genitourinary:  Positive for frequency.  Musculoskeletal: Negative.  Negative for back pain.  Neurological: Negative.  Negative for dizziness, focal weakness, weakness and headaches.  Psychiatric/Behavioral: Negative.  The patient is not nervous/anxious.     As per HPI. Otherwise, a complete review of systems is negative.  PAST MEDICAL HISTORY: Past Medical History:  Diagnosis Date   Diabetes mellitus without complication (HCC)    Emphysema of lung (HCC)    Hypertension    Stroke (HCC)     PAST SURGICAL HISTORY: Past Surgical History:  Procedure Laterality Date   FOOT SURGERY Right    KNEE SURGERY Right    SHOULDER SURGERY Left     FAMILY HISTORY: Family History  Problem Relation Age of Onset    COPD Mother    Diabetes Father    Diabetes Sister    Diabetes Sister    COPD Maternal Grandmother    Heart attack Maternal Grandfather    Diabetes Paternal Grandmother    Hyperlipidemia Paternal Grandmother    Hypertension Paternal Grandmother    Diabetes Paternal Grandfather    Hyperlipidemia Paternal Grandfather    Hypertension Paternal Grandfather     ADVANCED DIRECTIVES (Y/N):  N  HEALTH MAINTENANCE: Social History   Tobacco Use   Smoking status: Every Day    Current packs/day: 1.50    Average packs/day: 1.5 packs/day for 30.0 years (45.0 ttl pk-yrs)    Types: Cigarettes   Smokeless tobacco: Former    Types: Chew   Tobacco comments:    Patient has cut down to ~1 ppd since having stroke. Gave [place] Quit info. Patient down to 1/2 ppd  Vaping Use   Vaping status: Never Used  Substance Use Topics   Alcohol use: Yes    Comment: occasionally 1-2 beers   Drug use: Never     Colonoscopy:  PAP:  Bone density:  Lipid panel:  No Known Allergies  Current Outpatient Medications  Medication Sig Dispense Refill   acetaminophen (TYLENOL) 325 MG tablet Take 2 tablets (650 mg total) by mouth every 4 (four) hours as needed for mild pain (or temp > 37.5 C (99.5 F)).     albuterol (PROAIR HFA) 108 (90 Base) MCG/ACT inhaler Inhale 2 puffs into the lungs every 6 (six) hours as needed for wheezing or shortness of breath. 8.5 g 2  Aspirin (VAZALORE) 81 MG CAPS Take 1 tablet by mouth daily.     atorvastatin (LIPITOR) 80 MG tablet Take 1 tablet (80 mg total) by mouth daily. 90 tablet 3   chlorthalidone (HYGROTON) 25 MG tablet Take 1 tablet (25 mg total) by mouth once daily. 30 tablet 2   Cholecalciferol (VITAMIN D3) 1.25 MG (50000 UT) CAPS Take 1 capsule (1.25 mg total) by mouth once a week. 12 capsule 3   clopidogrel (PLAVIX) 75 MG tablet Take 1 tablet (75 mg total) by mouth once daily. 90 tablet 3   dapagliflozin propanediol (FARXIGA) 10 MG TABS tablet Take 1 tablet (10 mg total) by  mouth daily before breakfast. 30 tablet 4   gabapentin (NEURONTIN) 100 MG capsule Take 1 capsule (100 mg total) by mouth at bedtime. 90 capsule 2   glucose blood (RIGHTEST GS550 BLOOD GLUCOSE) test strip Use up to four times daily as directed to check blood sugar. 100 each 11   Insulin Glargine (BASAGLAR KWIKPEN) 100 UNIT/ML Inject 36 Units into the skin daily. 15 mL 6   Insulin Pen Needle (COMFORT EZ PEN NEEDLES) 32G X 4 MM MISC Inject 1 Needle with Basaglar as directed daily. 100 each 2   lisinopril (ZESTRIL) 20 MG tablet Take 1 tablet (20 mg total) by mouth once daily. 90 tablet 2   metFORMIN (GLUCOPHAGE-XR) 500 MG 24 hr tablet Take 2 tablets (1,000 mg total) by mouth daily with breakfast. 180 tablet 1   mometasone-formoterol (DULERA) 100-5 MCG/ACT AERO Inhale 2 puffs into the lungs 2 (two) times daily. 1 each 2   naproxen sodium (ALEVE) 220 MG tablet Take 220 mg by mouth daily as needed.     pantoprazole (PROTONIX) 40 MG tablet Take 1 tablet (40 mg total) by mouth daily. 90 tablet 0   Rightest GL300 Lancets MISC Use up to four times daily as directed to check blood sugar. 100 each 11   blood glucose meter kit and supplies KIT Use as directed (Patient not taking: Reported on 10/13/2022) 1 each 0   glucose blood (RIGHTEST GS550 BLOOD GLUCOSE) test strip Use to check blood glucose up to 4 times a day (Patient not taking: Reported on 10/13/2022) 100 each 0   No current facility-administered medications for this visit.    OBJECTIVE: Vitals:   10/27/22 1332  BP: 112/83  Pulse: 100  Resp: 18  Temp: 98.8 F (37.1 C)  SpO2: 94%     Body mass index is 25.4 kg/m.    ECOG FS:0 - Asymptomatic  General: Well-developed, well-nourished, no acute distress. Eyes: Pink conjunctiva, anicteric sclera. HEENT: Normocephalic, moist mucous membranes. Lungs: No audible wheezing or coughing. Heart: Regular rate and rhythm. Abdomen: Soft, nontender, no obvious distention. Musculoskeletal: No edema,  cyanosis, or clubbing. Neuro: Alert, answering all questions appropriately. Cranial nerves grossly intact. Skin: No rashes or petechiae noted. Psych: Normal affect. Lymphatics: No cervical, calvicular, axillary or inguinal LAD.   LAB RESULTS:  Lab Results  Component Value Date   NA 140 10/13/2022   K 4.2 10/13/2022   CL 97 10/13/2022   CO2 25 10/13/2022   GLUCOSE 170 (H) 10/13/2022   BUN 12 10/13/2022   CREATININE 0.83 10/13/2022   CALCIUM 9.7 10/13/2022   PROT 7.0 10/13/2022   ALBUMIN 4.4 10/13/2022   AST 12 10/13/2022   ALT 21 10/13/2022   ALKPHOS 80 10/13/2022   BILITOT 0.3 10/13/2022   GFRNONAA >60 02/09/2022   GFRAA >60 11/26/2015    Lab Results  Component  Value Date   WBC 13.8 (H) 10/27/2022   NEUTROABS 9.5 (H) 10/27/2022   HGB 15.4 10/27/2022   HCT 44.9 10/27/2022   MCV 86.2 10/27/2022   PLT 288 10/27/2022     STUDIES: No results found.  ASSESSMENT: Leukocytosis.  PLAN:    Leukocytosis: Upon review of patient's chart his total white blood cell count has been intermittently elevated since November 2017 ranging between 9.2 and 15.2.  He appears to have a neutrophilic predominance.  Today's result was 13.8.  The remainder of his laboratory work from today including iron stores, B12, folate, flow cytometry, and BCR-ABL mutation are pending at time of dictation.  No intervention is needed at this time.  Patient does not require bone marrow biopsy.  He will have a video assisted telemedicine visit in 3 weeks to discuss the results. Frequency/nocturia: Likely related to BPH.  Patient has been instructed to further discuss with his primary care physician.  I spent a total of 45 minutes reviewing chart data, face-to-face evaluation with the patient, counseling and coordination of care as detailed above.  Patient expressed understanding and was in agreement with this plan. He also understands that He can call clinic at any time with any questions, concerns, or  complaints.    Jeralyn Ruths, MD   10/27/2022 2:29 PM

## 2022-10-28 LAB — VITAMIN B12: Vitamin B-12: 560 pg/mL (ref 180–914)

## 2022-11-02 ENCOUNTER — Other Ambulatory Visit: Payer: Self-pay

## 2022-11-02 LAB — COMP PANEL: LEUKEMIA/LYMPHOMA

## 2022-11-03 ENCOUNTER — Other Ambulatory Visit: Payer: Self-pay | Admitting: Gerontology

## 2022-11-03 LAB — BCR-ABL1, CML/ALL, PCR, QUANT
E1A2 Transcript: <0.0032 %
Interpretation (BCRAL):: NEGATIVE
b2a2 transcript: <0.0032 %
b3a2 transcript: <0.0032 %

## 2022-11-04 ENCOUNTER — Other Ambulatory Visit: Payer: Self-pay | Admitting: Gerontology

## 2022-11-04 ENCOUNTER — Other Ambulatory Visit: Payer: Self-pay

## 2022-11-07 ENCOUNTER — Other Ambulatory Visit: Payer: Self-pay

## 2022-11-08 ENCOUNTER — Other Ambulatory Visit: Payer: Self-pay

## 2022-11-08 ENCOUNTER — Other Ambulatory Visit: Payer: Self-pay | Admitting: Internal Medicine

## 2022-11-10 ENCOUNTER — Other Ambulatory Visit: Payer: Self-pay

## 2022-11-10 MED FILL — Chlorthalidone Tab 25 MG: ORAL | 30 days supply | Qty: 30 | Fill #1 | Status: CN

## 2022-11-10 MED FILL — Lancets: 25 days supply | Qty: 100 | Fill #4 | Status: CN

## 2022-11-11 ENCOUNTER — Other Ambulatory Visit: Payer: Self-pay

## 2022-11-11 MED FILL — Albuterol Sulfate Inhal Aero 108 MCG/ACT (90MCG Base Equiv): RESPIRATORY_TRACT | 25 days supply | Qty: 18 | Fill #0 | Status: CN

## 2022-11-13 ENCOUNTER — Other Ambulatory Visit: Payer: Self-pay

## 2022-11-17 ENCOUNTER — Inpatient Hospital Stay: Payer: BLUE CROSS/BLUE SHIELD | Attending: Oncology | Admitting: Oncology

## 2022-11-17 ENCOUNTER — Encounter: Payer: Self-pay | Admitting: Oncology

## 2022-11-17 DIAGNOSIS — D72829 Elevated white blood cell count, unspecified: Secondary | ICD-10-CM

## 2022-11-17 NOTE — Progress Notes (Signed)
Patient has had more shortness of breathe at night now. He is having to wake up to use his inhaler some nights.

## 2022-11-17 NOTE — Progress Notes (Signed)
[facility]  [phone] Fax:(336) [phone]  ID: RICHMOND COLDREN OB: August 08, 1965  MR#: [id number]  JOA#:[id number]  Patient Care Team: Margaretann Loveless, MD as PCP - General (Internal Medicine)  I connected with Elissa Hefty on 11/17/22 at  3:30 PM EST by video enabled telemedicine visit and verified that I am speaking with the correct person using two identifiers.   I discussed the limitations, risks, security and privacy concerns of performing an evaluation and management service by telemedicine and the availability of in-person appointments. I also discussed with the patient that there may be a patient responsible charge related to this service. The patient expressed understanding and agreed to proceed.   Other persons participating in the visit and their role in the encounter: Patient, MD.  Patient's location: Car. Provider's location: Clinic.  CHIEF COMPLAINT: Leukocytosis.  INTERVAL HISTORY: Patient agreed to video-assisted telemedicine visit for further evaluation and discussion of his laboratory results.  He continues to feel well and remains asymptomatic. He denies any recent fevers or illnesses.  He has a good appetite and denies weight loss.  He does not complain of any weakness or fatigue.  He has no chest pain, shortness of breath, cough, or hemoptysis.  He denies any nausea, vomiting, constipation, or diarrhea.  He does admit to urinary frequency, incomplete bladder emptying, and nocturia.  Patient offers no specific complaints today.  REVIEW OF SYSTEMS:   Review of Systems  Constitutional: Negative.  Negative for fever, malaise/fatigue and weight loss.  Respiratory: Negative.  Negative for cough, hemoptysis and shortness of breath.   Cardiovascular: Negative.  Negative for chest pain and leg swelling.  Gastrointestinal: Negative.  Negative for abdominal pain.  Genitourinary: Negative.  Negative for frequency.  Musculoskeletal: Negative.   Negative for back pain.  Neurological: Negative.  Negative for dizziness, focal weakness, weakness and headaches.  Psychiatric/Behavioral: Negative.  The patient is not nervous/anxious.     As per HPI. Otherwise, a complete review of systems is negative.  PAST MEDICAL HISTORY: Past Medical History:  Diagnosis Date   Diabetes mellitus without complication (HCC)    Emphysema of lung (HCC)    Hypertension    Stroke (HCC)     PAST SURGICAL HISTORY: Past Surgical History:  Procedure Laterality Date   FOOT SURGERY Right    KNEE SURGERY Right    SHOULDER SURGERY Left     FAMILY HISTORY: Family History  Problem Relation Age of Onset   COPD Mother    Diabetes Father    Diabetes Sister    Diabetes Sister    COPD Maternal Grandmother    Heart attack Maternal Grandfather    Diabetes Paternal Grandmother    Hyperlipidemia Paternal Grandmother    Hypertension Paternal Grandmother    Diabetes Paternal Grandfather    Hyperlipidemia Paternal Grandfather    Hypertension Paternal Grandfather     ADVANCED DIRECTIVES (Y/N):  N  HEALTH MAINTENANCE: Social History   Tobacco Use   Smoking status: Every Day    Current packs/day: 1.50    Average packs/day: 1.5 packs/day for 30.0 years (45.0 ttl pk-yrs)    Types: Cigarettes   Smokeless tobacco: Former    Types: Chew   Tobacco comments:    Patient has cut down to ~1 ppd since having stroke. Gave [place] Quit info. Patient down to 1/2 ppd  Vaping Use   Vaping status: Never Used  Substance Use Topics   Alcohol use: Yes    Comment: occasionally 1-2 beers  Drug use: Never     Colonoscopy:  PAP:  Bone density:  Lipid panel:  No Known Allergies  Current Outpatient Medications  Medication Sig Dispense Refill   acetaminophen (TYLENOL) 325 MG tablet Take 2 tablets (650 mg total) by mouth every 4 (four) hours as needed for mild pain (or temp > 37.5 C (99.5 F)).     albuterol (PROAIR HFA) 108 (90 Base) MCG/ACT inhaler Inhale 2 puffs  into the lungs every 6 (six) hours as needed for wheezing or shortness of breath. 18 g 2   Aspirin (VAZALORE) 81 MG CAPS Take 1 tablet by mouth daily.     atorvastatin (LIPITOR) 80 MG tablet Take 1 tablet (80 mg total) by mouth daily. 90 tablet 3   chlorthalidone (HYGROTON) 25 MG tablet Take 1 tablet (25 mg total) by mouth once daily. 30 tablet 2   Cholecalciferol (VITAMIN D3) 1.25 MG (50000 UT) CAPS Take 1 capsule (1.25 mg total) by mouth once a week. 12 capsule 3   clopidogrel (PLAVIX) 75 MG tablet Take 1 tablet (75 mg total) by mouth once daily. 90 tablet 3   dapagliflozin propanediol (FARXIGA) 10 MG TABS tablet Take 1 tablet (10 mg total) by mouth daily before breakfast. 30 tablet 4   gabapentin (NEURONTIN) 100 MG capsule Take 1 capsule (100 mg total) by mouth at bedtime. 90 capsule 2   glucose blood (RIGHTEST GS550 BLOOD GLUCOSE) test strip Use up to four times daily as directed to check blood sugar. 100 each 11   Insulin Glargine (BASAGLAR KWIKPEN) 100 UNIT/ML Inject 36 Units into the skin daily. 15 mL 6   Insulin Pen Needle (COMFORT EZ PEN NEEDLES) 32G X 4 MM MISC Inject 1 Needle with Basaglar as directed daily. 100 each 2   lisinopril (ZESTRIL) 20 MG tablet Take 1 tablet (20 mg total) by mouth once daily. 90 tablet 2   metFORMIN (GLUCOPHAGE-XR) 500 MG 24 hr tablet Take 2 tablets (1,000 mg total) by mouth daily with breakfast. 180 tablet 1   mometasone-formoterol (DULERA) 100-5 MCG/ACT AERO Inhale 2 puffs into the lungs 2 (two) times daily. 1 each 2   naproxen sodium (ALEVE) 220 MG tablet Take 220 mg by mouth daily as needed.     pantoprazole (PROTONIX) 40 MG tablet Take 1 tablet (40 mg total) by mouth daily. 90 tablet 0   Rightest GL300 Lancets MISC Use up to four times daily as directed to check blood sugar. 100 each 11   blood glucose meter kit and supplies KIT Use as directed (Patient not taking: Reported on 10/13/2022) 1 each 0   glucose blood (RIGHTEST GS550 BLOOD GLUCOSE) test strip  Use to check blood glucose up to 4 times a day (Patient not taking: Reported on 10/13/2022) 100 each 0   No current facility-administered medications for this visit.    OBJECTIVE: There were no vitals filed for this visit.    There is no height or weight on file to calculate BMI.    ECOG FS:0 - Asymptomatic  General: Well-developed, well-nourished, no acute distress. HEENT: Normocephalic. Neuro: Alert, answering all questions appropriately. Cranial nerves grossly intact. Psych: Normal affect.  LAB RESULTS:  Lab Results  Component Value Date   NA 140 10/13/2022   K 4.2 10/13/2022   CL 97 10/13/2022   CO2 25 10/13/2022   GLUCOSE 170 (H) 10/13/2022   BUN 12 10/13/2022   CREATININE 0.83 10/13/2022   CALCIUM 9.7 10/13/2022   PROT 7.0 10/13/2022   ALBUMIN 4.4  10/13/2022   AST 12 10/13/2022   ALT 21 10/13/2022   ALKPHOS 80 10/13/2022   BILITOT 0.3 10/13/2022   GFRNONAA >60 02/09/2022   GFRAA >60 11/26/2015    Lab Results  Component Value Date   WBC 13.8 (H) 10/27/2022   NEUTROABS 9.5 (H) 10/27/2022   HGB 15.4 10/27/2022   HCT 44.9 10/27/2022   MCV 86.2 10/27/2022   PLT 288 10/27/2022     STUDIES: No results found.  ASSESSMENT: Leukocytosis.  PLAN:    Leukocytosis: Upon review of patient's chart his total white blood cell count has been intermittently elevated since November 2017 ranging between 9.2 and 15.2.  He appears to have a neutrophilic predominance.  His most recent result is 13.8.  All of his laboratory work including iron stores, B12, folate, peripheral blood flow cytometry, and BCR-ABL mutation are either negative or within normal limits.  No intervention is needed at this time.  Patient does not require bone marrow biopsy.  No further follow-up has been scheduled.  Please refer patient back if there are any questions or concerns.   Frequency/nocturia: Likely related to BPH.  Patient has been instructed to further discuss with his primary care physician.  I  provided 20 minutes of face-to-face video visit time during this encounter which included chart review, counseling, and coordination of care as documented above.   Patient expressed understanding and was in agreement with this plan. He also understands that He can call clinic at any time with any questions, concerns, or complaints.    Jeralyn Ruths, MD   11/17/2022 4:47 PM

## 2022-11-22 ENCOUNTER — Other Ambulatory Visit: Payer: Self-pay

## 2022-11-22 MED FILL — Chlorthalidone Tab 25 MG: ORAL | 30 days supply | Qty: 30 | Fill #1 | Status: CN

## 2022-11-22 MED FILL — Albuterol Sulfate Inhal Aero 108 MCG/ACT (90MCG Base Equiv): RESPIRATORY_TRACT | 68 days supply | Qty: 18 | Fill #0 | Status: CN

## 2022-11-22 MED FILL — Lancets: 25 days supply | Qty: 100 | Fill #4 | Status: CN

## 2022-11-24 ENCOUNTER — Ambulatory Visit: Payer: Medicaid Other | Admitting: Internal Medicine

## 2022-11-24 ENCOUNTER — Other Ambulatory Visit: Payer: Self-pay

## 2022-11-25 ENCOUNTER — Other Ambulatory Visit: Payer: Self-pay

## 2022-12-05 ENCOUNTER — Other Ambulatory Visit: Payer: Self-pay

## 2022-12-07 ENCOUNTER — Other Ambulatory Visit: Payer: Self-pay

## 2022-12-07 MED FILL — Lancets: 25 days supply | Qty: 100 | Fill #4 | Status: AC

## 2022-12-07 MED FILL — Chlorthalidone Tab 25 MG: ORAL | 30 days supply | Qty: 30 | Fill #1 | Status: AC

## 2022-12-13 ENCOUNTER — Other Ambulatory Visit: Payer: Self-pay

## 2022-12-13 MED FILL — Insulin Glargine Soln Pen-Injector 100 Unit/ML: SUBCUTANEOUS | 41 days supply | Qty: 15 | Fill #3 | Status: AC

## 2022-12-19 MED FILL — Clopidogrel Bisulfate Tab 75 MG (Base Equiv): ORAL | 90 days supply | Qty: 90 | Fill #1 | Status: AC

## 2022-12-20 ENCOUNTER — Other Ambulatory Visit: Payer: Self-pay

## 2022-12-23 ENCOUNTER — Other Ambulatory Visit: Payer: Self-pay

## 2022-12-23 MED FILL — Albuterol Sulfate Inhal Aero 108 MCG/ACT (90MCG Base Equiv): RESPIRATORY_TRACT | 25 days supply | Qty: 18 | Fill #0 | Status: AC

## 2023-01-13 ENCOUNTER — Encounter: Payer: Self-pay | Admitting: Internal Medicine

## 2023-01-13 ENCOUNTER — Ambulatory Visit: Payer: Medicaid Other | Admitting: Internal Medicine

## 2023-01-13 VITALS — BP 120/80 | HR 112 | Ht 69.0 in | Wt 175.2 lb

## 2023-01-13 DIAGNOSIS — E1165 Type 2 diabetes mellitus with hyperglycemia: Secondary | ICD-10-CM

## 2023-01-13 DIAGNOSIS — Z8673 Personal history of transient ischemic attack (TIA), and cerebral infarction without residual deficits: Secondary | ICD-10-CM | POA: Diagnosis not present

## 2023-01-13 DIAGNOSIS — F17210 Nicotine dependence, cigarettes, uncomplicated: Secondary | ICD-10-CM | POA: Diagnosis not present

## 2023-01-13 DIAGNOSIS — I10 Essential (primary) hypertension: Secondary | ICD-10-CM | POA: Diagnosis not present

## 2023-01-13 DIAGNOSIS — E782 Mixed hyperlipidemia: Secondary | ICD-10-CM

## 2023-01-13 DIAGNOSIS — E1159 Type 2 diabetes mellitus with other circulatory complications: Secondary | ICD-10-CM | POA: Insufficient documentation

## 2023-01-13 DIAGNOSIS — E0842 Diabetes mellitus due to underlying condition with diabetic polyneuropathy: Secondary | ICD-10-CM | POA: Insufficient documentation

## 2023-01-13 DIAGNOSIS — Z720 Tobacco use: Secondary | ICD-10-CM

## 2023-01-13 DIAGNOSIS — K219 Gastro-esophageal reflux disease without esophagitis: Secondary | ICD-10-CM | POA: Diagnosis not present

## 2023-01-13 DIAGNOSIS — E1169 Type 2 diabetes mellitus with other specified complication: Secondary | ICD-10-CM

## 2023-01-13 DIAGNOSIS — J438 Other emphysema: Secondary | ICD-10-CM

## 2023-01-13 LAB — GLUCOSE, POCT (MANUAL RESULT ENTRY): POC Glucose: 261 mg/dL — AB (ref 70–99)

## 2023-01-13 NOTE — Progress Notes (Signed)
 Established Patient Office Visit  Subjective:  Patient ID: Henry Anderson, male    DOB: 03-22-1965  Age: 58 y.o. MRN: [id number]  Chief Complaint  Patient presents with   Follow-up    3 months    Patient comes in for his follow-up today.  His fingerstick glucose is high today, admits to eating heavy meal last night.  He is monitoring his blood glucose at home, and his fastings have been in the 120-130 range.  He will get his lab work today.  Generally he is feeling well and has no new complaints.  He has been exercising and notes that he has lost inches.. He is up-to-date on his diabetic eye exam and urine microalbumin. His PHQ-9/GAD score is 4/3. He continues to smoke but significantly less than before.  He had a lung cancer screening done with low-dose CT in August 2024.  A benign-appearing nodule was identified and a repeat CT in 1 year was recommended.    No other concerns at this time.   Past Medical History:  Diagnosis Date   Diabetes mellitus without complication (HCC)    Emphysema of lung (HCC)    Hypertension    Stroke [facility])     Past Surgical History:  Procedure Laterality Date   FOOT SURGERY Right    KNEE SURGERY Right    SHOULDER SURGERY Left     Social History   Socioeconomic History   Marital status: Married    Spouse name: Not on file   Number of children: Not on file   Years of education: Not on file   Highest education level: Not on file  Occupational History   Not on file  Tobacco Use   Smoking status: Every Day    Current packs/day: 1.50    Average packs/day: 1.5 packs/day for 30.0 years (45.0 ttl pk-yrs)    Types: Cigarettes   Smokeless tobacco: Former    Types: Chew   Tobacco comments:    Patient has cut down to ~1 ppd since having stroke. Gave [place] Quit info. Patient down to 1/2 ppd  Vaping Use   Vaping status: Never Used  Substance and Sexual Activity   Alcohol use: Yes    Comment: occasionally 1-2 beers   Drug use: Never   Sexual  activity: Not on file  Other Topics Concern   Not on file  Social History Narrative   Not on file   Social Drivers of Health   Financial Resource Strain: High Risk (11/23/2021)   Overall Financial Resource Strain (CARDIA)    Difficulty of Paying Living Expenses: Very hard  Food Insecurity: Food Insecurity Present (10/27/2022)   Hunger Vital Sign    Worried About Running Out of Food in the Last Year: Often true    Ran Out of Food in the Last Year: Often true  Transportation Needs: No Transportation Needs (10/27/2022)   PRAPARE - Administrator, Civil Service (Medical): No    Lack of Transportation (Non-Medical): No  Physical Activity: Inactive (11/23/2021)   Exercise Vital Sign    Days of Exercise per Week: 0 days    Minutes of Exercise per Session: 0 min  Stress: Stress Concern Present (11/23/2021)   [employer] of Occupational Health - Occupational Stress Questionnaire    Feeling of Stress : Very much  Social Connections: Socially Isolated (11/23/2021)   Social Connection and Isolation Panel [NHANES]    Frequency of Communication with Friends and Family: Never    Frequency  of Social Gatherings with Friends and Family: Never    Attends Religious Services: Never    Database Administrator or Organizations: No    Attends Banker Meetings: Never    Marital Status: Married  Catering Manager Violence: Not At Risk (10/27/2022)   Humiliation, Afraid, Rape, and Kick questionnaire    Fear of Current or Ex-Partner: No    Emotionally Abused: No    Physically Abused: No    Sexually Abused: No    Family History  Problem Relation Age of Onset   COPD Mother    Diabetes Father    Diabetes Sister    Diabetes Sister    COPD Maternal Grandmother    Heart attack Maternal Grandfather    Diabetes Paternal Grandmother    Hyperlipidemia Paternal Grandmother    Hypertension Paternal Grandmother    Diabetes Paternal Grandfather    Hyperlipidemia Paternal  Grandfather    Hypertension Paternal Grandfather     No Known Allergies  Outpatient Medications Prior to Visit  Medication Sig   acetaminophen  (TYLENOL ) 325 MG tablet Take 2 tablets (650 mg total) by mouth every 4 (four) hours as needed for mild pain (or temp > 37.5 C (99.5 F)).   albuterol  (PROAIR  HFA) 108 (90 Base) MCG/ACT inhaler Inhale 2 puffs into the lungs every 6 (six) hours as needed for wheezing or shortness of breath.   Aspirin  (VAZALORE ) 81 MG CAPS Take 1 tablet by mouth daily.   atorvastatin  (LIPITOR ) 80 MG tablet Take 1 tablet (80 mg total) by mouth daily.   chlorthalidone  (HYGROTON ) 25 MG tablet Take 1 tablet (25 mg total) by mouth once daily.   Cholecalciferol  (VITAMIN D3) 1.25 MG (50000 UT) CAPS Take 1 capsule (1.25 mg total) by mouth once a week.   clopidogrel  (PLAVIX ) 75 MG tablet Take 1 tablet (75 mg total) by mouth once daily.   dapagliflozin  propanediol (FARXIGA ) 10 MG TABS tablet Take 1 tablet (10 mg total) by mouth daily before breakfast.   gabapentin  (NEURONTIN ) 100 MG capsule Take 1 capsule (100 mg total) by mouth at bedtime.   glucose blood (RIGHTEST GS550 BLOOD GLUCOSE) test strip Use up to four times daily as directed to check blood sugar.   Insulin  Glargine (BASAGLAR  KWIKPEN) 100 UNIT/ML Inject 36 Units into the skin daily.   Insulin  Pen Needle (COMFORT EZ PEN NEEDLES) 32G X 4 MM MISC Inject 1 Needle with Basaglar  as directed daily.   lisinopril  (ZESTRIL ) 20 MG tablet Take 1 tablet (20 mg total) by mouth once daily.   metFORMIN  (GLUCOPHAGE -XR) 500 MG 24 hr tablet Take 2 tablets (1,000 mg total) by mouth daily with breakfast.   mometasone -formoterol  (DULERA ) 100-5 MCG/ACT AERO Inhale 2 puffs into the lungs 2 (two) times daily.   naproxen sodium (ALEVE) 220 MG tablet Take 220 mg by mouth daily as needed.   pantoprazole  (PROTONIX ) 40 MG tablet Take 1 tablet (40 mg total) by mouth daily.   Rightest GL300 Lancets MISC Use up to four times daily as directed to check  blood sugar.   blood glucose meter kit and supplies KIT Use as directed (Patient not taking: Reported on 01/13/2023)   glucose blood (RIGHTEST GS550 BLOOD GLUCOSE) test strip Use to check blood glucose up to 4 times a day (Patient not taking: Reported on 01/13/2023)   No facility-administered medications prior to visit.    Review of Systems  Constitutional: Negative.  Negative for chills, fever, malaise/fatigue and weight loss.  HENT: Negative.  Negative for  congestion, sinus pain and sore throat.   Eyes: Negative.   Respiratory: Negative.  Negative for cough, shortness of breath and stridor.   Cardiovascular: Negative.  Negative for chest pain, palpitations and leg swelling.  Gastrointestinal: Negative.  Negative for abdominal pain, constipation, diarrhea, heartburn, nausea and vomiting.  Genitourinary: Negative.  Negative for dysuria and flank pain.  Musculoskeletal: Negative.  Negative for joint pain and myalgias.  Skin: Negative.   Neurological: Negative.  Negative for dizziness, tingling, sensory change and headaches.  Endo/Heme/Allergies: Negative.   Psychiatric/Behavioral: Negative.  Negative for depression and suicidal ideas. The patient is not nervous/anxious.       Objective:   BP 120/80   Pulse (!) 112   Ht 5' 9 (1.753 m)   Wt 175 lb 3.2 oz (79.5 kg)   SpO2 96%   BMI 25.87 kg/m   Vitals:   01/13/23 0923  BP: 120/80  Pulse: (!) 112  Height: 5' 9 (1.753 m)  Weight: 175 lb 3.2 oz (79.5 kg)  SpO2: 96%  BMI (Calculated): 25.86    Physical Exam Vitals and nursing note reviewed.  Constitutional:      General: He is not in acute distress.    Appearance: Normal appearance.  HENT:     Head: Normocephalic and atraumatic.     Nose: Nose normal.     Mouth/Throat:     Mouth: Mucous membranes are moist.     Pharynx: Oropharynx is clear.  Eyes:     Conjunctiva/sclera: Conjunctivae normal.     Pupils: Pupils are equal, round, and reactive to light.  Cardiovascular:      Rate and Rhythm: Normal rate and regular rhythm.     Pulses: Normal pulses.     Heart sounds: Normal heart sounds.  Pulmonary:     Effort: Pulmonary effort is normal.     Breath sounds: Normal breath sounds. No wheezing, rhonchi or rales.  Abdominal:     General: Bowel sounds are normal.     Palpations: Abdomen is soft. There is no mass.     Tenderness: There is no abdominal tenderness. There is no right CVA tenderness, left CVA tenderness, guarding or rebound.     Hernia: No hernia is present.  Musculoskeletal:        General: Normal range of motion.     Cervical back: Normal range of motion.     Right lower leg: No edema.     Left lower leg: No edema.  Skin:    General: Skin is warm and dry.     Findings: No rash.  Neurological:     General: No focal deficit present.     Mental Status: He is alert and oriented to person, place, and time.  Psychiatric:        Mood and Affect: Mood normal.        Behavior: Behavior normal.        Judgment: Judgment normal.      Results for orders placed or performed in visit on 01/13/23  POCT Glucose (CBG)  Result Value Ref Range   POC Glucose 261 (A) 70 - 99 mg/dl    Recent Results (from the past 2160 hours)  Folate     Status: None   Collection Time: 10/27/22  1:54 PM  Result Value Ref Range   Folate 15.2 >5.9 ng/mL    Comment: Performed at [facility], [address] [place], [place] [address]  Vitamin B12     Status: None  Collection Time: 10/27/22  1:54 PM  Result Value Ref Range   Vitamin B-12 560 180 - 914 pg/mL    Comment: (NOTE) This assay is not validated for testing neonatal or myeloproliferative syndrome specimens for Vitamin B12 levels. Performed at [facility], [address] [place], [place] [address]   Iron and TIBC     Status: None   Collection Time: 10/27/22  1:54 PM  Result Value Ref Range   Iron 67 45 - 182 ug/dL   TIBC 622 749 - 549 ug/dL   Saturation Ratios 18 17.9 - 39.5 %   UIBC  310 ug/dL    Comment: Performed at [facility], [address] [place], [place] [address]  Ferritin     Status: None   Collection Time: 10/27/22  1:54 PM  Result Value Ref Range   Ferritin 168 24 - 336 ng/mL    Comment: Performed at [facility], [address] [place], [place] [address]  BCR-ABL1, CML/ALL, PCR, QUANT     Status: None   Collection Time: 10/27/22  1:54 PM  Result Value Ref Range   b2a2 transcript <0.0032 % %   b3a2 transcript <0.0032 % %   E1A2 Transcript <0.0032 % %   Interpretation (BCRAL): Negative     Comment: (NOTE) NEGATIVE for the BCR-ABL1 e1a2 (p190), e13a2 (b2a2, p210) and e14a2 (b3a2, p210) fusion transcripts. These results do not rule out the presence of rare BCR-ABL1 transcripts not detected by this assay.    Director Review [facility]): Comment     Comment: (NOTE) Lucillie Paddock, PhD, [facility] Director, Molecular Oncology [facility] Path[facility] [place], [place] [address] [phone]    Background: Comment     Comment: (NOTE) This assay can detect three different types of BCR-ABL1 fusion transcripts associated with CML, ALL, and AML: e13a2 (previously b2a2) and e14a2 (previously b3a2) (major breakpoint, p210), as well as e1a2 (minor breakpoint, p190). The e13a2 and e14a2 transcript values are titrated to the current International Scale (IS). The standardized baseline is 100% BCR-ABL1 (IS) and major molecular response (MMR) is equivalent to 0.1% BCR-ABL1 (IS) corresponding to a 3-log reduction. Results should be correlated with appropriate clinical and laboratory information as indicated.    Methodology Comment     Comment: (NOTE) Total RNA is isolated from the sample and subject to a real-time, reverse transcriptase polymerase chain reaction (RT-PCR). The PCR primers and probes are specific for BCR-ABL1 e13a2, e14a2 and e1a2 fusion transcripts. The ABL1 transcript is amplified as the control for cDNA  quantity and quality. Serial dilutions of a validated positive control RNA with known t(9;22) BCR-ABL1 are used as reference for quantification of BCR-ABL1 relative to ABL1. The numeric BCR-ABL1 level is reported as % BCR-ABL1/ABL1 and the detection sensitivity is 4.5 log below the standard baseline (<0.0032%). This test was developed and its performance characteristics determined by [facility]. It has not been cleared or approved by the Food and Drug Administration. Performed At: [facility] [address] [place], [place] [id number] Loran Gales MDPhD Ey:[id number] Performed At: [facility] [address] [place], [place] [id number] Loran Gales MDPhD Ey:[phone]   Flow cytometry panel-leukemia/lymphoma work-up     Status: None   Collection Time: 10/27/22  1:54 PM  Result Value Ref Range   PATH INTERP XXX-IMP Comment     Comment: (NOTE) No significant immunophenotypic abnormality detected Lymphocytosis, see comment    ANNOTATION COMMENT IMP Comment  Comment: (NOTE) The lymphocytosis is due to an increase in CD4+ T-cells and CD8+ T-cells. Increases in multiple lymphocyte subsets suggests a reactive process. Clinical correlation is recommended.    CLINICAL INFO Comment     Comment: (NOTE) Accompanying CBC dated 10/27/2022 shows: WBC count 13.8,  Neu 9.5, Lym 3.0, Mon 0.7.    Specimen Type Comment     Comment: Peripheral blood   ASSESSMENT OF LEUKOCYTES Comment     Comment: (NOTE) No monoclonal B cell population is detected. There is no loss of, or aberrant expression of, the pan T cell antigens to suggest a neoplastic T cell process. CD4:CD8 ratio 2.5 No circulating blasts are detected. There is no immunophenotypic  evidence of abnormal myeloid maturation. Analysis of the lymphocyte population shows: B cells 9%, T cells 86%, NK cells 5%.    % Viable Cells Comment     Comment: 88%   ANALYSIS AND GATING STRATEGY Comment     Comment: (NOTE) 8 color  analysis with CD45/SSC gating [facility]    IMMUNOPHENOTYPING STUDY Comment     Comment: (NOTE) CD2       Normal         CD3       Normal CD4       Normal         CD5       Normal CD7       Normal         CD8       Normal CD10      Normal         CD11b     Normal CD13      Normal         CD14      Normal CD16      Normal         CD19      Normal CD20      Normal         CD33      Normal CD34      Normal         CD38      Normal CD45      Normal         CD56      Normal CD57      Normal         CD117     Normal HLA-DR    Normal         KAPPA     Normal LAMBDA    Normal         CD64      Normal    PATHOLOGIST NAME Comment     Comment: Terrea Cummins, M.D. Ph.D   COMMENT: Comment     Comment: (NOTE) Each antibody in this assay was utilized to assess for potential abnormalities of studied cell populations or to characterize identified abnormalities. This test was developed and its performance characteristics determined by [facility].  It has not been cleared or approved by the U.S. Food and Drug Administration. The FDA has determined that such clearance or approval is not necessary. This test is used for clinical purposes.  It should not be regarded as investigational or for research. Performed At: [facility] [address] [place], [place] [id number] Loran Gales MDPhD Ey:[phone] Performed At: [facility] [address] [place], [place] [id number] Chenn Anjen MD Ey:[phone] Performed At: [facility] [address] [place], [place] [id number] Chenn Anjen  MDPhD Ey:[phone]   CBC with Differential/Platelet     Status: Abnormal   Collection Time: 10/27/22  1:54 PM  Result Value Ref Range   WBC 13.8 (H) 4.0 - 10.5 K/uL   RBC 5.21 4.22 - 5.81 MIL/uL   Hemoglobin 15.4 13.0 - 17.0 g/dL   HCT 55.0 60.9 - 47.9 %   MCV 86.2 80.0 - 100.0 fL   MCH 29.6 26.0 - 34.0 pg   MCHC 34.3 30.0 - 36.0 g/dL   RDW 87.2 88.4 - 84.4 %   Platelets 288 150 - 400 K/uL   nRBC 0.0 0.0 -  0.2 %   Neutrophils Relative % 68 %   Neutro Abs 9.5 (H) 1.7 - 7.7 K/uL   Lymphocytes Relative 22 %   Lymphs Abs 3.0 0.7 - 4.0 K/uL   Monocytes Relative 5 %   Monocytes Absolute 0.7 0.1 - 1.0 K/uL   Eosinophils Relative 3 %   Eosinophils Absolute 0.4 0.0 - 0.5 K/uL   Basophils Relative 1 %   Basophils Absolute 0.1 0.0 - 0.1 K/uL   Immature Granulocytes 1 %   Abs Immature Granulocytes 0.07 0.00 - 0.07 K/uL    Comment: Performed at [facility], [address] [place], [place] [address]  POCT Glucose (CBG)     Status: Abnormal   Collection Time: 01/13/23  9:27 AM  Result Value Ref Range   POC Glucose 261 (A) 70 - 99 mg/dl      Assessment & Plan:  Patient advised about smoking cessation.  Needs strict diet control.  Check labs today.  May need to adjust his insulin  depending upon the results. Can increase his gabapentin  to 200 mg at bedtime to help with neuropathy. Problem List Items Addressed This Visit     Poorly controlled type 2 diabetes mellitus with peripheral neuropathy (HCC)   Relevant Orders   POCT Glucose (CBG) (Completed)   Hemoglobin A1c   Emphysema of lung (HCC)   Nicotine  abuse   GERD (gastroesophageal reflux disease)   Relevant Orders   CBC with Diff   Cigarette nicotine  dependence without complication   Hypertension associated with diabetes (HCC) - Primary   Relevant Orders   CMP14+EGFR   Combined hyperlipidemia associated with type 2 diabetes mellitus (HCC)   Relevant Orders   Lipid Panel w/o Chol/HDL Ratio   History of CVA (cerebrovascular accident) without residual deficits   Relevant Orders   CBC with Diff   Diabetic polyneuropathy 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   Total time spent: 30 minutes  FERNAND FREDY RAMAN, MD  01/13/2023   This document may have been prepared by [facility] Voice Recognition software and as such may include unintentional dictation errors.

## 2023-01-14 LAB — CBC WITH DIFFERENTIAL/PLATELET
Basophils Absolute: 0.1 10*3/uL (ref 0.0–0.2)
Basos: 1 %
EOS (ABSOLUTE): 0.2 10*3/uL (ref 0.0–0.4)
Eos: 1 %
Hematocrit: 48.8 % (ref 37.5–51.0)
Hemoglobin: 16.4 g/dL (ref 13.0–17.7)
Immature Grans (Abs): 0.1 10*3/uL (ref 0.0–0.1)
Immature Granulocytes: 1 %
Lymphocytes Absolute: 2.3 10*3/uL (ref 0.7–3.1)
Lymphs: 19 %
MCH: 29.7 pg (ref 26.6–33.0)
MCHC: 33.6 g/dL (ref 31.5–35.7)
MCV: 88 fL (ref 79–97)
Monocytes Absolute: 0.9 10*3/uL (ref 0.1–0.9)
Monocytes: 8 %
Neutrophils Absolute: 8.7 10*3/uL — ABNORMAL HIGH (ref 1.4–7.0)
Neutrophils: 70 %
Platelets: 250 10*3/uL (ref 150–450)
RBC: 5.52 x10E6/uL (ref 4.14–5.80)
RDW: 12.8 % (ref 11.6–15.4)
WBC: 12.2 10*3/uL — ABNORMAL HIGH (ref 3.4–10.8)

## 2023-01-14 LAB — CMP14+EGFR
ALT: 19 [IU]/L (ref 0–44)
AST: 14 [IU]/L (ref 0–40)
Albumin: 4.5 g/dL (ref 3.8–4.9)
Alkaline Phosphatase: 95 [IU]/L (ref 44–121)
BUN/Creatinine Ratio: 14 (ref 9–20)
BUN: 14 mg/dL (ref 6–24)
Bilirubin Total: 0.6 mg/dL (ref 0.0–1.2)
CO2: 25 mmol/L (ref 20–29)
Calcium: 10.1 mg/dL (ref 8.7–10.2)
Chloride: 94 mmol/L — ABNORMAL LOW (ref 96–106)
Creatinine, Ser: 1 mg/dL (ref 0.76–1.27)
Globulin, Total: 2.6 g/dL (ref 1.5–4.5)
Glucose: 236 mg/dL — ABNORMAL HIGH (ref 70–99)
Potassium: 4.1 mmol/L (ref 3.5–5.2)
Sodium: 136 mmol/L (ref 134–144)
Total Protein: 7.1 g/dL (ref 6.0–8.5)
eGFR: 88 mL/min/{1.73_m2} (ref >59)

## 2023-01-14 LAB — LIPID PANEL W/O CHOL/HDL RATIO
Cholesterol, Total: 156 mg/dL (ref 100–199)
HDL: 32 mg/dL — ABNORMAL LOW (ref >39)
LDL Chol Calc (NIH): 80 mg/dL (ref 0–99)
Triglycerides: 267 mg/dL — ABNORMAL HIGH (ref 0–149)
VLDL Cholesterol Cal: 44 mg/dL — ABNORMAL HIGH (ref 5–40)

## 2023-01-14 LAB — HEMOGLOBIN A1C
Est. average glucose Bld gHb Est-mCnc: 235 mg/dL
Hgb A1c MFr Bld: 9.8 % — ABNORMAL HIGH (ref 4.8–5.6)

## 2023-01-16 ENCOUNTER — Other Ambulatory Visit: Payer: Self-pay | Admitting: Internal Medicine

## 2023-01-16 DIAGNOSIS — K219 Gastro-esophageal reflux disease without esophagitis: Secondary | ICD-10-CM

## 2023-01-16 MED FILL — Atorvastatin Calcium Tab 80 MG (Base Equivalent): ORAL | 90 days supply | Qty: 90 | Fill #1 | Status: AC

## 2023-01-16 MED FILL — Chlorthalidone Tab 25 MG: ORAL | 30 days supply | Qty: 30 | Fill #2 | Status: AC

## 2023-01-16 MED FILL — Insulin Glargine Soln Pen-Injector 100 Unit/ML: SUBCUTANEOUS | 41 days supply | Qty: 15 | Fill #4 | Status: CN

## 2023-01-16 MED FILL — Lancets: 25 days supply | Qty: 100 | Fill #5 | Status: AC

## 2023-01-16 MED FILL — Lisinopril Tab 20 MG: ORAL | 90 days supply | Qty: 90 | Fill #1 | Status: AC

## 2023-01-16 NOTE — Progress Notes (Signed)
 Patient notified

## 2023-01-17 ENCOUNTER — Other Ambulatory Visit: Payer: Self-pay

## 2023-01-17 MED ORDER — PANTOPRAZOLE SODIUM 40 MG PO TBEC
40.0000 mg | DELAYED_RELEASE_TABLET | Freq: Every day | ORAL | 0 refills | Status: DC
  Filled 2023-01-17: qty 90, 90d supply, fill #0

## 2023-01-18 ENCOUNTER — Other Ambulatory Visit: Payer: Self-pay

## 2023-01-21 ENCOUNTER — Other Ambulatory Visit: Payer: Self-pay | Admitting: Internal Medicine

## 2023-01-21 DIAGNOSIS — E119 Type 2 diabetes mellitus without complications: Secondary | ICD-10-CM

## 2023-01-22 ENCOUNTER — Other Ambulatory Visit: Payer: Self-pay

## 2023-01-22 MED FILL — Insulin Glargine Soln Pen-Injector 100 Unit/ML: SUBCUTANEOUS | 41 days supply | Qty: 15 | Fill #4 | Status: AC

## 2023-01-23 ENCOUNTER — Other Ambulatory Visit: Payer: Self-pay

## 2023-01-23 MED ORDER — GLUCOSE BLOOD VI STRP
ORAL_STRIP | 0 refills | Status: DC
  Filled 2023-01-23: qty 100, 25d supply, fill #0

## 2023-01-30 ENCOUNTER — Ambulatory Visit: Payer: Medicaid Other | Admitting: Internal Medicine

## 2023-01-31 ENCOUNTER — Other Ambulatory Visit: Payer: Self-pay

## 2023-02-06 ENCOUNTER — Other Ambulatory Visit: Payer: Self-pay

## 2023-02-06 ENCOUNTER — Ambulatory Visit (INDEPENDENT_AMBULATORY_CARE_PROVIDER_SITE_OTHER): Payer: Medicaid Other | Admitting: Internal Medicine

## 2023-02-06 ENCOUNTER — Encounter: Payer: Self-pay | Admitting: Internal Medicine

## 2023-02-06 VITALS — BP 108/78 | HR 104 | Ht 69.0 in | Wt 174.8 lb

## 2023-02-06 DIAGNOSIS — J410 Simple chronic bronchitis: Secondary | ICD-10-CM

## 2023-02-06 DIAGNOSIS — Z8673 Personal history of transient ischemic attack (TIA), and cerebral infarction without residual deficits: Secondary | ICD-10-CM | POA: Diagnosis not present

## 2023-02-06 DIAGNOSIS — E1169 Type 2 diabetes mellitus with other specified complication: Secondary | ICD-10-CM

## 2023-02-06 DIAGNOSIS — F17210 Nicotine dependence, cigarettes, uncomplicated: Secondary | ICD-10-CM

## 2023-02-06 DIAGNOSIS — E1159 Type 2 diabetes mellitus with other circulatory complications: Secondary | ICD-10-CM | POA: Diagnosis not present

## 2023-02-06 DIAGNOSIS — E119 Type 2 diabetes mellitus without complications: Secondary | ICD-10-CM | POA: Insufficient documentation

## 2023-02-06 DIAGNOSIS — E782 Mixed hyperlipidemia: Secondary | ICD-10-CM

## 2023-02-06 DIAGNOSIS — E1165 Type 2 diabetes mellitus with hyperglycemia: Secondary | ICD-10-CM

## 2023-02-06 DIAGNOSIS — I152 Hypertension secondary to endocrine disorders: Secondary | ICD-10-CM

## 2023-02-06 MED ORDER — BASAGLAR KWIKPEN 100 UNIT/ML ~~LOC~~ SOPN
45.0000 [IU] | PEN_INJECTOR | Freq: Every day | SUBCUTANEOUS | 6 refills | Status: DC
  Filled 2023-02-06 – 2023-03-02 (×3): qty 30, 66d supply, fill #0
  Filled 2023-04-30 – 2023-05-11 (×2): qty 30, 66d supply, fill #1
  Filled 2023-06-10 – 2023-06-16 (×3): qty 30, 66d supply, fill #2

## 2023-02-06 MED ORDER — AMOXICILLIN-POT CLAVULANATE 875-125 MG PO TABS
1.0000 | ORAL_TABLET | Freq: Two times a day (BID) | ORAL | 0 refills | Status: DC
  Filled 2023-02-06: qty 14, 7d supply, fill #0

## 2023-02-06 NOTE — Progress Notes (Signed)
Established Patient Office Visit  Subjective:  Patient ID: Henry Anderson, male    DOB: 05-30-1965  Age: 58 y.o. MRN: [id number]  Chief Complaint  Patient presents with   Follow-up    Diabetes follow up    Patient comes in to discuss his labs which showed an elevated hemoglobin A1c.  He was advised to increase the dose of his Basaglar and he brought in fingerstick glucose readings.  They are still quite high.  Patient admits to poor dietary compliance.  Agrees to start a very strict diet control.  Also to increase his  Basaglar insulin to 45 units at bedtime. Patient has a cough which is productive of yellowish sputum.  He is still smoking 1-1/2 pack/day of cigarettes.. His WBC count was also elevated.  Will treat with antibiotics and repeat WBC at next visit.    No other concerns at this time.   Past Medical History:  Diagnosis Date   Diabetes mellitus without complication (HCC)    Emphysema of lung (HCC)    Hypertension    Stroke [facility])     Past Surgical History:  Procedure Laterality Date   FOOT SURGERY Right    KNEE SURGERY Right    SHOULDER SURGERY Left     Social History   Socioeconomic History   Marital status: Married    Spouse name: Not on file   Number of children: Not on file   Years of education: Not on file   Highest education level: Not on file  Occupational History   Not on file  Tobacco Use   Smoking status: Every Day    Current packs/day: 1.50    Average packs/day: 1.5 packs/day for 30.0 years (45.0 ttl pk-yrs)    Types: Cigarettes   Smokeless tobacco: Former    Types: Chew   Tobacco comments:    Patient has cut down to ~1 ppd since having stroke. Gave [place] Quit info. Patient down to 1/2 ppd  Vaping Use   Vaping status: Never Used  Substance and Sexual Activity   Alcohol use: Yes    Comment: occasionally 1-2 beers   Drug use: Never   Sexual activity: Not on file  Other Topics Concern   Not on file  Social History Narrative   Not on file    Social Drivers of Health   Financial Resource Strain: High Risk (11/23/2021)   Overall Financial Resource Strain (CARDIA)    Difficulty of Paying Living Expenses: Very hard  Food Insecurity: Food Insecurity Present (10/27/2022)   Hunger Vital Sign    Worried About Running Out of Food in the Last Year: Often true    Ran Out of Food in the Last Year: Often true  Transportation Needs: No Transportation Needs (10/27/2022)   PRAPARE - Administrator, Civil Service (Medical): No    Lack of Transportation (Non-Medical): No  Physical Activity: Inactive (11/23/2021)   Exercise Vital Sign    Days of Exercise per Week: 0 days    Minutes of Exercise per Session: 0 min  Stress: Stress Concern Present (11/23/2021)   [employer] of Occupational Health - Occupational Stress Questionnaire    Feeling of Stress : Very much  Social Connections: Socially Isolated (11/23/2021)   Social Connection and Isolation Panel [NHANES]    Frequency of Communication with Friends and Family: Never    Frequency of Social Gatherings with Friends and Family: Never    Attends Religious Services: Never    Active Member of  Clubs or Organizations: No    Attends Banker Meetings: Never    Marital Status: Married  Catering manager Violence: Not At Risk (10/27/2022)   Humiliation, Afraid, Rape, and Kick questionnaire    Fear of Current or Ex-Partner: No    Emotionally Abused: No    Physically Abused: No    Sexually Abused: No    Family History  Problem Relation Age of Onset   COPD Mother    Diabetes Father    Diabetes Sister    Diabetes Sister    COPD Maternal Grandmother    Heart attack Maternal Grandfather    Diabetes Paternal Grandmother    Hyperlipidemia Paternal Grandmother    Hypertension Paternal Grandmother    Diabetes Paternal Grandfather    Hyperlipidemia Paternal Grandfather    Hypertension Paternal Grandfather     No Known Allergies  Outpatient Medications Prior  to Visit  Medication Sig   acetaminophen (TYLENOL) 325 MG tablet Take 2 tablets (650 mg total) by mouth every 4 (four) hours as needed for mild pain (or temp > 37.5 C (99.5 F)).   albuterol (PROAIR HFA) 108 (90 Base) MCG/ACT inhaler Inhale 2 puffs into the lungs every 6 (six) hours as needed for wheezing or shortness of breath.   Aspirin (VAZALORE) 81 MG CAPS Take 1 tablet by mouth daily.   atorvastatin (LIPITOR) 80 MG tablet Take 1 tablet (80 mg total) by mouth daily.   blood glucose meter kit and supplies KIT Use as directed   chlorthalidone (HYGROTON) 25 MG tablet Take 1 tablet (25 mg total) by mouth once daily.   Cholecalciferol (VITAMIN D3) 1.25 MG (50000 UT) CAPS Take 1 capsule (1.25 mg total) by mouth once a week.   clopidogrel (PLAVIX) 75 MG tablet Take 1 tablet (75 mg total) by mouth once daily.   dapagliflozin propanediol (FARXIGA) 10 MG TABS tablet Take 1 tablet (10 mg total) by mouth daily before breakfast.   gabapentin (NEURONTIN) 100 MG capsule Take 1 capsule (100 mg total) by mouth at bedtime.   glucose blood test strip Use to check blood glucose up to 4 times a day   Insulin Pen Needle (COMFORT EZ PEN NEEDLES) 32G X 4 MM MISC Inject 1 Needle with Basaglar as directed daily.   lisinopril (ZESTRIL) 20 MG tablet Take 1 tablet (20 mg total) by mouth once daily.   metFORMIN (GLUCOPHAGE-XR) 500 MG 24 hr tablet Take 2 tablets (1,000 mg total) by mouth daily with breakfast.   mometasone-formoterol (DULERA) 100-5 MCG/ACT AERO Inhale 2 puffs into the lungs 2 (two) times daily.   naproxen sodium (ALEVE) 220 MG tablet Take 220 mg by mouth daily as needed.   pantoprazole (PROTONIX) 40 MG tablet Take 1 tablet (40 mg total) by mouth daily.   Rightest GL300 Lancets MISC Use up to four times daily as directed to check blood sugar.   [DISCONTINUED] Insulin Glargine (BASAGLAR KWIKPEN) 100 UNIT/ML Inject 36 Units into the skin daily. (Patient taking differently: Inject 40 Units into the skin daily.)    No facility-administered medications prior to visit.    Review of Systems  Constitutional: Negative.  Negative for chills, fever, malaise/fatigue and weight loss.  HENT: Negative.  Negative for congestion, sinus pain and sore throat.   Eyes: Negative.   Respiratory:  Positive for cough and sputum production. Negative for shortness of breath.   Cardiovascular: Negative.  Negative for chest pain, palpitations and leg swelling.  Gastrointestinal: Negative.  Negative for abdominal pain, constipation, diarrhea,  heartburn, nausea and vomiting.  Genitourinary: Negative.  Negative for dysuria and flank pain.  Musculoskeletal: Negative.  Negative for joint pain and myalgias.  Skin: Negative.   Neurological: Negative.  Negative for dizziness and headaches.  Endo/Heme/Allergies: Negative.   Psychiatric/Behavioral: Negative.  Negative for depression and suicidal ideas. The patient is not nervous/anxious.        Objective:   BP 108/78   Pulse (!) 104   Ht 5\' 9"  (1.753 m)   Wt 174 lb 12.8 oz (79.3 kg)   SpO2 96%   BMI 25.81 kg/m   Vitals:   02/06/23 0947  BP: 108/78  Pulse: (!) 104  Height: 5\' 9"  (1.753 m)  Weight: 174 lb 12.8 oz (79.3 kg)  SpO2: 96%  BMI (Calculated): 25.8    Physical Exam Vitals and nursing note reviewed.  Constitutional:      Appearance: Normal appearance.  HENT:     Head: Normocephalic and atraumatic.     Nose: Nose normal.     Mouth/Throat:     Mouth: Mucous membranes are moist.     Pharynx: Oropharynx is clear.  Eyes:     Conjunctiva/sclera: Conjunctivae normal.     Pupils: Pupils are equal, round, and reactive to light.  Cardiovascular:     Rate and Rhythm: Normal rate and regular rhythm.     Pulses: Normal pulses.     Heart sounds: Normal heart sounds.  Pulmonary:     Effort: Pulmonary effort is normal.     Breath sounds: Normal breath sounds.  Abdominal:     General: Bowel sounds are normal.     Palpations: Abdomen is soft.   Musculoskeletal:        General: Normal range of motion.     Cervical back: Normal range of motion.  Skin:    General: Skin is warm and dry.  Neurological:     General: No focal deficit present.     Mental Status: He is alert and oriented to person, place, and time.  Psychiatric:        Mood and Affect: Mood normal.        Behavior: Behavior normal.        Judgment: Judgment normal.      No results found for any visits on 02/06/23.  Recent Results (from the past 2160 hours)  POCT Glucose (CBG)     Status: Abnormal   Collection Time: 01/13/23  9:27 AM  Result Value Ref Range   POC Glucose 261 (A) 70 - 99 mg/dl  [id number]     Status: Abnormal   Collection Time: 01/13/23  9:47 AM  Result Value Ref Range   Glucose 236 (H) 70 - 99 mg/dL   BUN 14 6 - 24 mg/dL   Creatinine, Ser 7.82 0.76 - 1.27 mg/dL   eGFR 88 >95 AO/ZHY/8.65   BUN/Creatinine Ratio 14 9 - 20   Sodium 136 134 - 144 mmol/L   Potassium 4.1 3.5 - 5.2 mmol/L   Chloride 94 (L) 96 - 106 mmol/L   CO2 25 20 - 29 mmol/L   Calcium 10.1 8.7 - 10.2 mg/dL   Total Protein 7.1 6.0 - 8.5 g/dL   Albumin 4.5 3.8 - 4.9 g/dL   Globulin, Total 2.6 1.5 - 4.5 g/dL   Bilirubin Total 0.6 0.0 - 1.2 mg/dL   Alkaline Phosphatase 95 44 - 121 IU/L   AST 14 0 - 40 IU/L   ALT 19 0 - 44 IU/L  Hemoglobin A1c  Status: Abnormal   Collection Time: 01/13/23  9:47 AM  Result Value Ref Range   Hgb A1c MFr Bld 9.8 (H) 4.8 - 5.6 %    Comment:          Prediabetes: 5.7 - 6.4          Diabetes: >6.4          Glycemic control for adults with diabetes: <7.0    Est. average glucose Bld gHb Est-mCnc 235 mg/dL  Lipid Panel w/o Chol/HDL Ratio     Status: Abnormal   Collection Time: 01/13/23  9:47 AM  Result Value Ref Range   Cholesterol, Total 156 100 - 199 mg/dL   Triglycerides 782 (H) 0 - 149 mg/dL   HDL 32 (L) >95 mg/dL   VLDL Cholesterol Cal 44 (H) 5 - 40 mg/dL   LDL Chol Calc (NIH) 80 0 - 99 mg/dL  CBC with Diff     Status: Abnormal    Collection Time: 01/13/23  9:47 AM  Result Value Ref Range   WBC 12.2 (H) 3.4 - 10.8 x10E3/uL   RBC 5.52 4.14 - 5.80 x10E6/uL   Hemoglobin 16.4 13.0 - 17.7 g/dL   Hematocrit 62.1 30.8 - 51.0 %   MCV 88 79 - 97 fL   MCH 29.7 26.6 - 33.0 pg   MCHC 33.6 31.5 - 35.7 g/dL   RDW 65.7 84.6 - 96.2 %   Platelets 250 150 - 450 x10E3/uL   Neutrophils 70 Not Estab. %   Lymphs 19 Not Estab. %   Monocytes 8 Not Estab. %   Eos 1 Not Estab. %   Basos 1 Not Estab. %   Neutrophils Absolute 8.7 (H) 1.4 - 7.0 x10E3/uL   Lymphocytes Absolute 2.3 0.7 - 3.1 x10E3/uL   Monocytes Absolute 0.9 0.1 - 0.9 x10E3/uL   EOS (ABSOLUTE) 0.2 0.0 - 0.4 x10E3/uL   Basophils Absolute 0.1 0.0 - 0.2 x10E3/uL   Immature Granulocytes 1 Not Estab. %   Immature Grans (Abs) 0.1 0.0 - 0.1 x10E3/uL      Assessment & Plan:  Increase Basaglar to 45 units at bedtime.  Strict diet control. Course of Augmentin.  Advised to stop smoking.  Repeat WBC at follow-up. Problem List Items Addressed This Visit     Cigarette nicotine dependence without complication   Hypertension associated with diabetes (HCC) - Primary   Relevant Medications   Insulin Glargine (BASAGLAR KWIKPEN) 100 UNIT/ML   Combined hyperlipidemia associated with type 2 diabetes mellitus (HCC)   Relevant Medications   Insulin Glargine (BASAGLAR KWIKPEN) 100 UNIT/ML   History of CVA (cerebrovascular accident) without residual deficits   Type 2 diabetes mellitus without complication, without long-term current use of insulin (HCC)   Relevant Medications   Insulin Glargine (BASAGLAR KWIKPEN) 100 UNIT/ML   Other Visit Diagnoses       Simple chronic bronchitis (HCC)       Relevant Medications   amoxicillin-clavulanate (AUGMENTIN) 875-125 MG tablet       Return in about 3 weeks (around 02/27/2023).   Total time spent: 30 minutes  Margaretann Loveless, MD  02/06/2023   This document may have been prepared by [facility] Voice Recognition software and as such may  include unintentional dictation errors.

## 2023-02-15 ENCOUNTER — Other Ambulatory Visit: Payer: Self-pay | Admitting: Internal Medicine

## 2023-02-15 ENCOUNTER — Other Ambulatory Visit: Payer: Self-pay

## 2023-02-15 DIAGNOSIS — I10 Essential (primary) hypertension: Secondary | ICD-10-CM

## 2023-02-15 DIAGNOSIS — E119 Type 2 diabetes mellitus without complications: Secondary | ICD-10-CM

## 2023-02-15 MED FILL — Lancets: 25 days supply | Qty: 100 | Fill #6 | Status: CN

## 2023-02-16 ENCOUNTER — Other Ambulatory Visit: Payer: Self-pay | Admitting: Internal Medicine

## 2023-02-16 ENCOUNTER — Other Ambulatory Visit: Payer: Self-pay

## 2023-02-16 DIAGNOSIS — I10 Essential (primary) hypertension: Secondary | ICD-10-CM

## 2023-02-16 DIAGNOSIS — E119 Type 2 diabetes mellitus without complications: Secondary | ICD-10-CM

## 2023-02-16 MED FILL — Chlorthalidone Tab 25 MG: ORAL | 30 days supply | Qty: 30 | Fill #0 | Status: CN

## 2023-02-16 MED FILL — Metformin HCl Tab ER 24HR 500 MG: ORAL | 90 days supply | Qty: 180 | Fill #0 | Status: CN

## 2023-02-18 ENCOUNTER — Other Ambulatory Visit: Payer: Self-pay

## 2023-02-20 ENCOUNTER — Other Ambulatory Visit: Payer: Self-pay

## 2023-02-27 ENCOUNTER — Other Ambulatory Visit: Payer: Self-pay

## 2023-02-27 ENCOUNTER — Ambulatory Visit: Payer: Medicaid Other | Admitting: Internal Medicine

## 2023-02-28 ENCOUNTER — Other Ambulatory Visit: Payer: Self-pay

## 2023-03-02 ENCOUNTER — Other Ambulatory Visit: Payer: Self-pay

## 2023-03-02 ENCOUNTER — Other Ambulatory Visit: Payer: Self-pay | Admitting: Internal Medicine

## 2023-03-02 DIAGNOSIS — K219 Gastro-esophageal reflux disease without esophagitis: Secondary | ICD-10-CM

## 2023-03-02 DIAGNOSIS — E1140 Type 2 diabetes mellitus with diabetic neuropathy, unspecified: Secondary | ICD-10-CM

## 2023-03-02 DIAGNOSIS — E119 Type 2 diabetes mellitus without complications: Secondary | ICD-10-CM

## 2023-03-02 MED FILL — Albuterol Sulfate Inhal Aero 108 MCG/ACT (90MCG Base Equiv): RESPIRATORY_TRACT | 25 days supply | Qty: 18 | Fill #1 | Status: AC

## 2023-03-02 MED FILL — Lisinopril Tab 20 MG: ORAL | 90 days supply | Qty: 90 | Fill #2 | Status: CN

## 2023-03-02 MED FILL — Atorvastatin Calcium Tab 80 MG (Base Equivalent): ORAL | 90 days supply | Qty: 90 | Fill #2 | Status: CN

## 2023-03-02 MED FILL — Metformin HCl Tab ER 24HR 500 MG: ORAL | 90 days supply | Qty: 180 | Fill #0 | Status: AC

## 2023-03-02 MED FILL — Chlorthalidone Tab 25 MG: ORAL | 30 days supply | Qty: 30 | Fill #0 | Status: AC

## 2023-03-02 MED FILL — Clopidogrel Bisulfate Tab 75 MG (Base Equiv): ORAL | 90 days supply | Qty: 90 | Fill #2 | Status: AC

## 2023-03-03 ENCOUNTER — Other Ambulatory Visit: Payer: Self-pay

## 2023-03-03 MED FILL — Pantoprazole Sodium EC Tab 40 MG (Base Equiv): ORAL | 90 days supply | Qty: 90 | Fill #0 | Status: CN

## 2023-03-03 MED FILL — "Insulin Pen Needle 32 G X 4 MM (1/6"" or 5/32"")": 90 days supply | Qty: 100 | Fill #0 | Status: AC

## 2023-03-03 MED FILL — Dapagliflozin Propanediol Tab 10 MG (Base Equivalent): ORAL | 30 days supply | Qty: 30 | Fill #0 | Status: AC

## 2023-03-05 ENCOUNTER — Other Ambulatory Visit: Payer: Self-pay

## 2023-03-05 ENCOUNTER — Other Ambulatory Visit: Payer: Self-pay | Admitting: Internal Medicine

## 2023-03-05 DIAGNOSIS — E119 Type 2 diabetes mellitus without complications: Secondary | ICD-10-CM

## 2023-03-06 ENCOUNTER — Other Ambulatory Visit: Payer: Self-pay

## 2023-03-06 MED ORDER — ACCU-CHEK GUIDE TEST VI STRP
ORAL_STRIP | 0 refills | Status: DC
  Filled 2023-03-06: qty 100, 25d supply, fill #0

## 2023-03-13 ENCOUNTER — Other Ambulatory Visit: Payer: Self-pay

## 2023-03-16 ENCOUNTER — Other Ambulatory Visit: Payer: Self-pay

## 2023-03-17 ENCOUNTER — Other Ambulatory Visit: Payer: Self-pay

## 2023-03-17 ENCOUNTER — Encounter: Payer: Self-pay | Admitting: Internal Medicine

## 2023-03-17 ENCOUNTER — Ambulatory Visit: Payer: Medicaid Other | Admitting: Internal Medicine

## 2023-03-17 VITALS — BP 122/82 | HR 113 | Ht 69.0 in | Wt 177.2 lb

## 2023-03-17 DIAGNOSIS — E1159 Type 2 diabetes mellitus with other circulatory complications: Secondary | ICD-10-CM | POA: Diagnosis not present

## 2023-03-17 DIAGNOSIS — E782 Mixed hyperlipidemia: Secondary | ICD-10-CM

## 2023-03-17 DIAGNOSIS — Z8673 Personal history of transient ischemic attack (TIA), and cerebral infarction without residual deficits: Secondary | ICD-10-CM | POA: Diagnosis not present

## 2023-03-17 DIAGNOSIS — E1142 Type 2 diabetes mellitus with diabetic polyneuropathy: Secondary | ICD-10-CM

## 2023-03-17 DIAGNOSIS — E1165 Type 2 diabetes mellitus with hyperglycemia: Secondary | ICD-10-CM | POA: Diagnosis not present

## 2023-03-17 DIAGNOSIS — E1169 Type 2 diabetes mellitus with other specified complication: Secondary | ICD-10-CM | POA: Diagnosis not present

## 2023-03-17 DIAGNOSIS — F17210 Nicotine dependence, cigarettes, uncomplicated: Secondary | ICD-10-CM | POA: Diagnosis not present

## 2023-03-17 DIAGNOSIS — I152 Hypertension secondary to endocrine disorders: Secondary | ICD-10-CM

## 2023-03-17 MED ORDER — SEMAGLUTIDE(0.25 OR 0.5MG/DOS) 2 MG/3ML ~~LOC~~ SOPN
0.2500 mg | PEN_INJECTOR | SUBCUTANEOUS | 0 refills | Status: DC
  Filled 2023-03-17: qty 3, 56d supply, fill #0
  Filled 2023-03-17 (×3): qty 3, 28d supply, fill #0
  Filled 2023-03-20: qty 3, 56d supply, fill #0
  Filled 2023-03-20 – 2023-03-23 (×2): qty 3, 28d supply, fill #0

## 2023-03-17 NOTE — Progress Notes (Signed)
 Established Patient Office Visit  Subjective:  Patient ID: Henry Anderson, male    DOB: October 22, 1965  Age: 58 y.o. MRN: [id number]  Chief Complaint  Patient presents with   Follow-up    3 week follow up    Patient comes in for follow-up of his diabetes.  He has been taking Basaglar 45 units at night but only a few fasting sugars are under 150.  He is also on metformin and [place].  Although he is trying to control his diet but still drinks sodas occasionally. He has gained some weight.  Trying to cut back on his cigarette smoking. Will increase his Basaglar to 50 units at night, add Ozempic 0.25 mg/week.  And strict diet control.    No other concerns at this time.   Past Medical History:  Diagnosis Date   Diabetes mellitus without complication (HCC)    Emphysema of lung (HCC)    Hypertension    Stroke [facility])     Past Surgical History:  Procedure Laterality Date   FOOT SURGERY Right    KNEE SURGERY Right    SHOULDER SURGERY Left     Social History   Socioeconomic History   Marital status: Married    Spouse name: Not on file   Number of children: Not on file   Years of education: Not on file   Highest education level: Not on file  Occupational History   Not on file  Tobacco Use   Smoking status: Every Day    Current packs/day: 1.50    Average packs/day: 1.5 packs/day for 30.0 years (45.0 ttl pk-yrs)    Types: Cigarettes   Smokeless tobacco: Former    Types: Chew   Tobacco comments:    Patient has cut down to ~1 ppd since having stroke. Gave [place] Quit info. Patient down to 1/2 ppd  Vaping Use   Vaping status: Never Used  Substance and Sexual Activity   Alcohol use: Yes    Comment: occasionally 1-2 beers   Drug use: Never   Sexual activity: Not on file  Other Topics Concern   Not on file  Social History Narrative   Not on file   Social Drivers of Health   Financial Resource Strain: High Risk (11/23/2021)   Overall Financial Resource Strain (CARDIA)     Difficulty of Paying Living Expenses: Very hard  Food Insecurity: Food Insecurity Present (10/27/2022)   Hunger Vital Sign    Worried About Running Out of Food in the Last Year: Often true    Ran Out of Food in the Last Year: Often true  Transportation Needs: No Transportation Needs (10/27/2022)   PRAPARE - Administrator, Civil Service (Medical): No    Lack of Transportation (Non-Medical): No  Physical Activity: Inactive (11/23/2021)   Exercise Vital Sign    Days of Exercise per Week: 0 days    Minutes of Exercise per Session: 0 min  Stress: Stress Concern Present (11/23/2021)   [employer] of Occupational Health - Occupational Stress Questionnaire    Feeling of Stress : Very much  Social Connections: Socially Isolated (11/23/2021)   Social Connection and Isolation Panel [NHANES]    Frequency of Communication with Friends and Family: Never    Frequency of Social Gatherings with Friends and Family: Never    Attends Religious Services: Never    Database administrator or Organizations: No    Attends Banker Meetings: Never    Marital Status: Married  Intimate Partner Violence: Not At Risk (10/27/2022)   Humiliation, Afraid, Rape, and Kick questionnaire    Fear of Current or Ex-Partner: No    Emotionally Abused: No    Physically Abused: No    Sexually Abused: No    Family History  Problem Relation Age of Onset   COPD Mother    Diabetes Father    Diabetes Sister    Diabetes Sister    COPD Maternal Grandmother    Heart attack Maternal Grandfather    Diabetes Paternal Grandmother    Hyperlipidemia Paternal Grandmother    Hypertension Paternal Grandmother    Diabetes Paternal Grandfather    Hyperlipidemia Paternal Grandfather    Hypertension Paternal Grandfather     No Known Allergies  Outpatient Medications Prior to Visit  Medication Sig   acetaminophen (TYLENOL) 325 MG tablet Take 2 tablets (650 mg total) by mouth every 4 (four) hours as  needed for mild pain (or temp > 37.5 C (99.5 F)).   albuterol (PROAIR HFA) 108 (90 Base) MCG/ACT inhaler Inhale 2 puffs into the lungs every 6 (six) hours as needed for wheezing or shortness of breath.   Aspirin (VAZALORE) 81 MG CAPS Take 1 tablet by mouth daily.   atorvastatin (LIPITOR) 80 MG tablet Take 1 tablet (80 mg total) by mouth daily.   blood glucose meter kit and supplies KIT Use as directed   chlorthalidone (HYGROTON) 25 MG tablet Take 1 tablet (25 mg total) by mouth once daily.   Cholecalciferol (VITAMIN D3) 1.25 MG (50000 UT) CAPS Take 1 capsule (1.25 mg total) by mouth once a week.   clopidogrel (PLAVIX) 75 MG tablet Take 1 tablet (75 mg total) by mouth once daily.   dapagliflozin propanediol (FARXIGA) 10 MG TABS tablet Take 1 tablet (10 mg total) by mouth daily before breakfast.   gabapentin (NEURONTIN) 100 MG capsule Take 1 capsule (100 mg total) by mouth at bedtime.   glucose blood (ACCU-CHEK GUIDE TEST) test strip Use to check blood glucose up to 4 times a day   Insulin Glargine (BASAGLAR KWIKPEN) 100 UNIT/ML Inject 45 Units into the skin daily.   Insulin Pen Needle (TRUEPLUS 5-BEVEL PEN NEEDLES) 32G X 4 MM MISC Inject 1 Needle with Basaglar as directed daily.   lisinopril (ZESTRIL) 20 MG tablet Take 1 tablet (20 mg total) by mouth once daily.   metFORMIN (GLUCOPHAGE-XR) 500 MG 24 hr tablet Take 2 tablets (1,000 mg total) by mouth daily with breakfast.   mometasone-formoterol (DULERA) 100-5 MCG/ACT AERO Inhale 2 puffs into the lungs 2 (two) times daily.   pantoprazole (PROTONIX) 40 MG tablet Take 1 tablet (40 mg total) by mouth daily.   Rightest GL300 Lancets MISC Use up to four times daily as directed to check blood sugar.   naproxen sodium (ALEVE) 220 MG tablet Take 220 mg by mouth daily as needed. (Patient not taking: Reported on 03/17/2023)   [DISCONTINUED] amoxicillin-clavulanate (AUGMENTIN) 875-125 MG tablet Take 1 tablet by mouth 2 (two) times daily. (Patient not taking:  Reported on 03/17/2023)   No facility-administered medications prior to visit.    Review of Systems  Constitutional: Negative.  Negative for chills, fever, malaise/fatigue and weight loss.  HENT: Negative.  Negative for congestion and sore throat.   Eyes: Negative.   Respiratory: Negative.  Negative for cough and shortness of breath.   Cardiovascular: Negative.  Negative for chest pain, palpitations and leg swelling.  Gastrointestinal: Negative.  Negative for abdominal pain, constipation, diarrhea, heartburn, nausea and vomiting.  Genitourinary: Negative.  Negative for dysuria and flank pain.  Musculoskeletal: Negative.  Negative for joint pain and myalgias.  Skin: Negative.   Neurological: Negative.  Negative for dizziness and headaches.  Endo/Heme/Allergies: Negative.   Psychiatric/Behavioral: Negative.  Negative for depression and suicidal ideas. The patient is not nervous/anxious.        Objective:   BP 122/82   Pulse (!) 113   Ht 5\' 9"  (1.753 m)   Wt 177 lb 3.2 oz (80.4 kg)   SpO2 97%   BMI 26.17 kg/m   Vitals:   03/17/23 1046  BP: 122/82  Pulse: (!) 113  Height: 5\' 9"  (1.753 m)  Weight: 177 lb 3.2 oz (80.4 kg)  SpO2: 97%  BMI (Calculated): 26.16    Physical Exam Vitals and nursing note reviewed.  Constitutional:      Appearance: Normal appearance.  HENT:     Head: Normocephalic and atraumatic.     Nose: Nose normal.     Mouth/Throat:     Mouth: Mucous membranes are moist.     Pharynx: Oropharynx is clear.  Eyes:     Conjunctiva/sclera: Conjunctivae normal.     Pupils: Pupils are equal, round, and reactive to light.  Cardiovascular:     Rate and Rhythm: Normal rate and regular rhythm.     Pulses: Normal pulses.     Heart sounds: Normal heart sounds.  Pulmonary:     Effort: Pulmonary effort is normal.     Breath sounds: Normal breath sounds.  Abdominal:     General: Bowel sounds are normal.     Palpations: Abdomen is soft.  Musculoskeletal:         General: Normal range of motion.     Cervical back: Normal range of motion.  Skin:    General: Skin is warm and dry.  Neurological:     General: No focal deficit present.     Mental Status: He is alert and oriented to person, place, and time.  Psychiatric:        Mood and Affect: Mood normal.        Behavior: Behavior normal.        Judgment: Judgment normal.      No results found for any visits on 03/17/23.  Recent Results (from the past 2160 hours)  POCT Glucose (CBG)     Status: Abnormal   Collection Time: 01/13/23  9:27 AM  Result Value Ref Range   POC Glucose 261 (A) 70 - 99 mg/dl  [id number]     Status: Abnormal   Collection Time: 01/13/23  9:47 AM  Result Value Ref Range   Glucose 236 (H) 70 - 99 mg/dL   BUN 14 6 - 24 mg/dL   Creatinine, Ser 2.95 0.76 - 1.27 mg/dL   eGFR 88 >28 UX/LKG/4.01   BUN/Creatinine Ratio 14 9 - 20   Sodium 136 134 - 144 mmol/L   Potassium 4.1 3.5 - 5.2 mmol/L   Chloride 94 (L) 96 - 106 mmol/L   CO2 25 20 - 29 mmol/L   Calcium 10.1 8.7 - 10.2 mg/dL   Total Protein 7.1 6.0 - 8.5 g/dL   Albumin 4.5 3.8 - 4.9 g/dL   Globulin, Total 2.6 1.5 - 4.5 g/dL   Bilirubin Total 0.6 0.0 - 1.2 mg/dL   Alkaline Phosphatase 95 44 - 121 IU/L   AST 14 0 - 40 IU/L   ALT 19 0 - 44 IU/L  Hemoglobin A1c     Status: Abnormal  Collection Time: 01/13/23  9:47 AM  Result Value Ref Range   Hgb A1c MFr Bld 9.8 (H) 4.8 - 5.6 %    Comment:          Prediabetes: 5.7 - 6.4          Diabetes: >6.4          Glycemic control for adults with diabetes: <7.0    Est. average glucose Bld gHb Est-mCnc 235 mg/dL  Lipid Panel w/o Chol/HDL Ratio     Status: Abnormal   Collection Time: 01/13/23  9:47 AM  Result Value Ref Range   Cholesterol, Total 156 100 - 199 mg/dL   Triglycerides 161 (H) 0 - 149 mg/dL   HDL 32 (L) >09 mg/dL   VLDL Cholesterol Cal 44 (H) 5 - 40 mg/dL   LDL Chol Calc (NIH) 80 0 - 99 mg/dL  CBC with Diff     Status: Abnormal   Collection Time: 01/13/23   9:47 AM  Result Value Ref Range   WBC 12.2 (H) 3.4 - 10.8 x10E3/uL   RBC 5.52 4.14 - 5.80 x10E6/uL   Hemoglobin 16.4 13.0 - 17.7 g/dL   Hematocrit 60.4 54.0 - 51.0 %   MCV 88 79 - 97 fL   MCH 29.7 26.6 - 33.0 pg   MCHC 33.6 31.5 - 35.7 g/dL   RDW 98.1 19.1 - 47.8 %   Platelets 250 150 - 450 x10E3/uL   Neutrophils 70 Not Estab. %   Lymphs 19 Not Estab. %   Monocytes 8 Not Estab. %   Eos 1 Not Estab. %   Basos 1 Not Estab. %   Neutrophils Absolute 8.7 (H) 1.4 - 7.0 x10E3/uL   Lymphocytes Absolute 2.3 0.7 - 3.1 x10E3/uL   Monocytes Absolute 0.9 0.1 - 0.9 x10E3/uL   EOS (ABSOLUTE) 0.2 0.0 - 0.4 x10E3/uL   Basophils Absolute 0.1 0.0 - 0.2 x10E3/uL   Immature Granulocytes 1 Not Estab. %   Immature Grans (Abs) 0.1 0.0 - 0.1 x10E3/uL      Assessment & Plan:  Increase Basaglar to 50 units at night. Continue Farxiga and metformin.  Add Ozempic 0.25 mg/week.  Will increase dose in 4 weeks. Problem List Items Addressed This Visit     Poorly controlled type 2 diabetes mellitus with peripheral neuropathy (HCC)   Relevant Medications   Semaglutide,0.25 or 0.5MG /DOS, 2 MG/3ML SOPN   Cigarette nicotine dependence without complication   Hypertension associated with diabetes (HCC) - Primary   Relevant Medications   Semaglutide,0.25 or 0.5MG /DOS, 2 MG/3ML SOPN   Combined hyperlipidemia associated with type 2 diabetes mellitus (HCC)   Relevant Medications   Semaglutide,0.25 or 0.5MG /DOS, 2 MG/3ML SOPN   History of CVA (cerebrovascular accident) without residual deficits    Return in about 1 month (around 04/17/2023).   Total time spent: 30 minutes  Margaretann Loveless, MD  03/17/2023   This document may have been prepared by [facility] Voice Recognition software and as such may include unintentional dictation errors.

## 2023-03-20 ENCOUNTER — Other Ambulatory Visit: Payer: Self-pay

## 2023-03-21 ENCOUNTER — Other Ambulatory Visit: Payer: Self-pay

## 2023-03-23 ENCOUNTER — Other Ambulatory Visit: Payer: Self-pay

## 2023-04-07 MED FILL — Dapagliflozin Propanediol Tab 10 MG (Base Equivalent): ORAL | 30 days supply | Qty: 30 | Fill #1 | Status: CN

## 2023-04-07 MED FILL — Chlorthalidone Tab 25 MG: ORAL | 30 days supply | Qty: 30 | Fill #1 | Status: AC

## 2023-04-07 MED FILL — Lancets: 25 days supply | Qty: 100 | Fill #6 | Status: AC

## 2023-04-07 MED FILL — Lisinopril Tab 20 MG: ORAL | 90 days supply | Qty: 90 | Fill #2 | Status: AC

## 2023-04-09 ENCOUNTER — Other Ambulatory Visit: Payer: Self-pay

## 2023-04-10 ENCOUNTER — Other Ambulatory Visit: Payer: Self-pay

## 2023-04-13 ENCOUNTER — Other Ambulatory Visit

## 2023-04-13 ENCOUNTER — Ambulatory Visit: Payer: Medicaid Other | Admitting: Internal Medicine

## 2023-04-14 ENCOUNTER — Other Ambulatory Visit: Payer: Self-pay

## 2023-04-14 ENCOUNTER — Other Ambulatory Visit: Payer: Self-pay | Admitting: Internal Medicine

## 2023-04-14 DIAGNOSIS — E1142 Type 2 diabetes mellitus with diabetic polyneuropathy: Secondary | ICD-10-CM

## 2023-04-14 DIAGNOSIS — K219 Gastro-esophageal reflux disease without esophagitis: Secondary | ICD-10-CM

## 2023-04-14 DIAGNOSIS — E1169 Type 2 diabetes mellitus with other specified complication: Secondary | ICD-10-CM | POA: Diagnosis not present

## 2023-04-14 DIAGNOSIS — E782 Mixed hyperlipidemia: Secondary | ICD-10-CM | POA: Diagnosis not present

## 2023-04-14 DIAGNOSIS — E1165 Type 2 diabetes mellitus with hyperglycemia: Secondary | ICD-10-CM | POA: Diagnosis not present

## 2023-04-14 DIAGNOSIS — E1159 Type 2 diabetes mellitus with other circulatory complications: Secondary | ICD-10-CM | POA: Diagnosis not present

## 2023-04-14 DIAGNOSIS — F17210 Nicotine dependence, cigarettes, uncomplicated: Secondary | ICD-10-CM

## 2023-04-14 DIAGNOSIS — I152 Hypertension secondary to endocrine disorders: Secondary | ICD-10-CM

## 2023-04-15 LAB — CBC WITH DIFFERENTIAL/PLATELET
Basophils Absolute: 0.1 10*3/uL (ref 0.0–0.2)
Basos: 0 %
EOS (ABSOLUTE): 0.2 10*3/uL (ref 0.0–0.4)
Eos: 1 %
Hematocrit: 45.7 % (ref 37.5–51.0)
Hemoglobin: 15.4 g/dL (ref 13.0–17.7)
Immature Grans (Abs): 0 10*3/uL (ref 0.0–0.1)
Immature Granulocytes: 0 %
Lymphocytes Absolute: 2.2 10*3/uL (ref 0.7–3.1)
Lymphs: 16 %
MCH: 29.2 pg (ref 26.6–33.0)
MCHC: 33.7 g/dL (ref 31.5–35.7)
MCV: 87 fL (ref 79–97)
Monocytes Absolute: 0.7 10*3/uL (ref 0.1–0.9)
Monocytes: 5 %
Neutrophils Absolute: 10.6 10*3/uL — ABNORMAL HIGH (ref 1.4–7.0)
Neutrophils: 78 %
Platelets: 344 10*3/uL (ref 150–450)
RBC: 5.27 x10E6/uL (ref 4.14–5.80)
RDW: 12.8 % (ref 11.6–15.4)
WBC: 13.8 10*3/uL — ABNORMAL HIGH (ref 3.4–10.8)

## 2023-04-15 LAB — HEMOGLOBIN A1C
Est. average glucose Bld gHb Est-mCnc: 171 mg/dL
Hgb A1c MFr Bld: 7.6 % — ABNORMAL HIGH (ref 4.8–5.6)

## 2023-04-15 LAB — LIPID PANEL W/O CHOL/HDL RATIO
Cholesterol, Total: 101 mg/dL (ref 100–199)
HDL: 25 mg/dL — ABNORMAL LOW (ref >39)
LDL Chol Calc (NIH): 57 mg/dL (ref 0–99)
Triglycerides: 101 mg/dL (ref 0–149)
VLDL Cholesterol Cal: 19 mg/dL (ref 5–40)

## 2023-04-15 LAB — CMP14+EGFR
ALT: 18 IU/L (ref 0–44)
AST: 16 IU/L (ref 0–40)
Albumin: 4.3 g/dL (ref 3.8–4.9)
Alkaline Phosphatase: 76 IU/L (ref 44–121)
BUN/Creatinine Ratio: 16 (ref 9–20)
BUN: 13 mg/dL (ref 6–24)
Bilirubin Total: 0.4 mg/dL (ref 0.0–1.2)
CO2: 21 mmol/L (ref 20–29)
Calcium: 9.5 mg/dL (ref 8.7–10.2)
Chloride: 99 mmol/L (ref 96–106)
Creatinine, Ser: 0.82 mg/dL (ref 0.76–1.27)
Globulin, Total: 2.3 g/dL (ref 1.5–4.5)
Glucose: 72 mg/dL (ref 70–99)
Potassium: 3.8 mmol/L (ref 3.5–5.2)
Sodium: 139 mmol/L (ref 134–144)
Total Protein: 6.6 g/dL (ref 6.0–8.5)
eGFR: 103 mL/min/{1.73_m2} (ref >59)

## 2023-04-17 ENCOUNTER — Other Ambulatory Visit: Payer: Self-pay

## 2023-04-17 ENCOUNTER — Encounter: Payer: Self-pay | Admitting: Internal Medicine

## 2023-04-17 ENCOUNTER — Ambulatory Visit (INDEPENDENT_AMBULATORY_CARE_PROVIDER_SITE_OTHER): Admitting: Internal Medicine

## 2023-04-17 VITALS — BP 110/76 | HR 122 | Ht 69.0 in | Wt 169.8 lb

## 2023-04-17 DIAGNOSIS — E1169 Type 2 diabetes mellitus with other specified complication: Secondary | ICD-10-CM

## 2023-04-17 DIAGNOSIS — L989 Disorder of the skin and subcutaneous tissue, unspecified: Secondary | ICD-10-CM | POA: Diagnosis not present

## 2023-04-17 DIAGNOSIS — E1165 Type 2 diabetes mellitus with hyperglycemia: Secondary | ICD-10-CM

## 2023-04-17 DIAGNOSIS — E1142 Type 2 diabetes mellitus with diabetic polyneuropathy: Secondary | ICD-10-CM

## 2023-04-17 DIAGNOSIS — E0842 Diabetes mellitus due to underlying condition with diabetic polyneuropathy: Secondary | ICD-10-CM | POA: Diagnosis not present

## 2023-04-17 DIAGNOSIS — R002 Palpitations: Secondary | ICD-10-CM

## 2023-04-17 DIAGNOSIS — Z720 Tobacco use: Secondary | ICD-10-CM | POA: Diagnosis not present

## 2023-04-17 DIAGNOSIS — E1159 Type 2 diabetes mellitus with other circulatory complications: Secondary | ICD-10-CM | POA: Diagnosis not present

## 2023-04-17 DIAGNOSIS — I152 Hypertension secondary to endocrine disorders: Secondary | ICD-10-CM

## 2023-04-17 DIAGNOSIS — E782 Mixed hyperlipidemia: Secondary | ICD-10-CM | POA: Diagnosis not present

## 2023-04-17 LAB — POCT CBG (FASTING - GLUCOSE)-MANUAL ENTRY: Glucose Fasting, POC: 101 mg/dL — AB (ref 70–99)

## 2023-04-17 MED ORDER — HUMALOG KWIKPEN 200 UNIT/ML ~~LOC~~ SOPN
PEN_INJECTOR | SUBCUTANEOUS | 3 refills | Status: DC
Start: 2023-04-17 — End: 2023-08-07
  Filled 2023-04-17: qty 3, 25d supply, fill #0
  Filled 2023-05-09: qty 3, 25d supply, fill #1
  Filled 2023-06-04: qty 3, 25d supply, fill #2
  Filled 2023-06-14 – 2023-07-09 (×2): qty 3, 25d supply, fill #3

## 2023-04-17 NOTE — Progress Notes (Signed)
 Established Patient Office Visit  Subjective:  Patient ID: Henry Anderson, male    DOB: 10-17-1965  Age: 58 y.o. MRN: [id number]  Chief Complaint  Patient presents with   Follow-up    1 month follow up    Patient comes in for his follow-up today.  He has been feeling well and has no new complaints.  His labs were done recently and it shows that his hemoglobin A1c has dropped to 7.6 from 9.8.  He is taking all his medications regularly including Ozempic which is helping him to lose weight.  He brings in a record of his home fingersticks which show spikes after meals.  He used to be on Humalog sliding scale in the past, will add it back again to check his fingersticks before lunch and supper and cover with sliding scale Humalog.  He needs to continue to monitor his fingersticks in case it drops and will need to cut back on his basal insulin. His white blood cells remain above normal, he has been evaluated by the hematologist and cleared. He has a lesion on his left forearm, will refer to dermatologist.    No other concerns at this time.   Past Medical History:  Diagnosis Date   Diabetes mellitus without complication (HCC)    Emphysema of lung (HCC)    Hypertension    Stroke [facility])     Past Surgical History:  Procedure Laterality Date   FOOT SURGERY Right    KNEE SURGERY Right    SHOULDER SURGERY Left     Social History   Socioeconomic History   Marital status: Married    Spouse name: Not on file   Number of children: Not on file   Years of education: Not on file   Highest education level: Not on file  Occupational History   Not on file  Tobacco Use   Smoking status: Every Day    Current packs/day: 1.50    Average packs/day: 1.5 packs/day for 30.0 years (45.0 ttl pk-yrs)    Types: Cigarettes   Smokeless tobacco: Former    Types: Chew   Tobacco comments:    Patient has cut down to ~1 ppd since having stroke. Gave [place] Quit info. Patient down to 1/2 ppd  Vaping Use    Vaping status: Never Used  Substance and Sexual Activity   Alcohol use: Yes    Comment: occasionally 1-2 beers   Drug use: Never   Sexual activity: Not on file  Other Topics Concern   Not on file  Social History Narrative   Not on file   Social Drivers of Health   Financial Resource Strain: High Risk (11/23/2021)   Overall Financial Resource Strain (CARDIA)    Difficulty of Paying Living Expenses: Very hard  Food Insecurity: Food Insecurity Present (10/27/2022)   Hunger Vital Sign    Worried About Running Out of Food in the Last Year: Often true    Ran Out of Food in the Last Year: Often true  Transportation Needs: No Transportation Needs (10/27/2022)   PRAPARE - Administrator, Civil Service (Medical): No    Lack of Transportation (Non-Medical): No  Physical Activity: Inactive (11/23/2021)   Exercise Vital Sign    Days of Exercise per Week: 0 days    Minutes of Exercise per Session: 0 min  Stress: Stress Concern Present (11/23/2021)   [employer] of Occupational Health - Occupational Stress Questionnaire    Feeling of Stress : Very much  Social Connections: Socially Isolated (11/23/2021)   Social Connection and Isolation Panel [NHANES]    Frequency of Communication with Friends and Family: Never    Frequency of Social Gatherings with Friends and Family: Never    Attends Religious Services: Never    Database administrator or Organizations: No    Attends Banker Meetings: Never    Marital Status: Married  Catering manager Violence: Not At Risk (10/27/2022)   Humiliation, Afraid, Rape, and Kick questionnaire    Fear of Current or Ex-Partner: No    Emotionally Abused: No    Physically Abused: No    Sexually Abused: No    Family History  Problem Relation Age of Onset   COPD Mother    Diabetes Father    Diabetes Sister    Diabetes Sister    COPD Maternal Grandmother    Heart attack Maternal Grandfather    Diabetes Paternal Grandmother     Hyperlipidemia Paternal Grandmother    Hypertension Paternal Grandmother    Diabetes Paternal Grandfather    Hyperlipidemia Paternal Grandfather    Hypertension Paternal Grandfather     No Known Allergies  Outpatient Medications Prior to Visit  Medication Sig   acetaminophen (TYLENOL) 325 MG tablet Take 2 tablets (650 mg total) by mouth every 4 (four) hours as needed for mild pain (or temp > 37.5 C (99.5 F)).   albuterol (PROAIR HFA) 108 (90 Base) MCG/ACT inhaler Inhale 2 puffs into the lungs every 6 (six) hours as needed for wheezing or shortness of breath.   Aspirin (VAZALORE) 81 MG CAPS Take 1 tablet by mouth daily.   atorvastatin (LIPITOR) 80 MG tablet Take 1 tablet (80 mg total) by mouth daily.   blood glucose meter kit and supplies KIT Use as directed   chlorthalidone (HYGROTON) 25 MG tablet Take 1 tablet (25 mg total) by mouth once daily.   Cholecalciferol (VITAMIN D3) 1.25 MG (50000 UT) CAPS Take 1 capsule (1.25 mg total) by mouth once a week.   clopidogrel (PLAVIX) 75 MG tablet Take 1 tablet (75 mg total) by mouth once daily.   dapagliflozin propanediol (FARXIGA) 10 MG TABS tablet Take 1 tablet (10 mg total) by mouth daily before breakfast.   gabapentin (NEURONTIN) 100 MG capsule Take 1 capsule (100 mg total) by mouth at bedtime.   glucose blood (ACCU-CHEK GUIDE TEST) test strip Use to check blood glucose up to 4 times a day   Insulin Glargine (BASAGLAR KWIKPEN) 100 UNIT/ML Inject 45 Units into the skin daily.   Insulin Pen Needle (TRUEPLUS 5-BEVEL PEN NEEDLES) 32G X 4 MM MISC Inject 1 Needle with Basaglar as directed daily.   lisinopril (ZESTRIL) 20 MG tablet Take 1 tablet (20 mg total) by mouth once daily.   metFORMIN (GLUCOPHAGE-XR) 500 MG 24 hr tablet Take 2 tablets (1,000 mg total) by mouth daily with breakfast.   mometasone-formoterol (DULERA) 100-5 MCG/ACT AERO Inhale 2 puffs into the lungs 2 (two) times daily.   naproxen sodium (ALEVE) 220 MG tablet Take 220 mg by mouth  daily as needed.   pantoprazole (PROTONIX) 40 MG tablet Take 1 tablet (40 mg total) by mouth daily.   Rightest GL300 Lancets MISC Use up to four times daily as directed to check blood sugar.   Semaglutide,0.25 or 0.5MG /DOS, 2 MG/3ML SOPN Inject 0.25 mg into the skin once a week.   No facility-administered medications prior to visit.    Review of Systems  Constitutional: Negative.  Negative for chills, fever,  malaise/fatigue and weight loss.  HENT: Negative.  Negative for congestion and sore throat.   Eyes: Negative.   Respiratory: Negative.  Negative for cough and shortness of breath.   Cardiovascular: Negative.  Negative for chest pain, palpitations and leg swelling.  Gastrointestinal: Negative.  Negative for abdominal pain, constipation, diarrhea, heartburn, nausea and vomiting.  Genitourinary: Negative.  Negative for dysuria and flank pain.  Musculoskeletal: Negative.  Negative for joint pain and myalgias.  Skin: Negative.   Neurological: Negative.  Negative for dizziness, tingling, tremors and headaches.  Endo/Heme/Allergies: Negative.   Psychiatric/Behavioral: Negative.  Negative for depression and suicidal ideas. The patient is not nervous/anxious.        Objective:   BP 110/76   Pulse (!) 122   Ht 5\' 9"  (1.753 m)   Wt 169 lb 12.8 oz (77 kg)   SpO2 96%   BMI 25.08 kg/m   Vitals:   04/17/23 1001  BP: 110/76  Pulse: (!) 122  Height: 5\' 9"  (1.753 m)  Weight: 169 lb 12.8 oz (77 kg)  SpO2: 96%  BMI (Calculated): 25.06    Physical Exam Vitals and nursing note reviewed.  Constitutional:      Appearance: Normal appearance.  HENT:     Head: Normocephalic and atraumatic.     Nose: Nose normal.     Mouth/Throat:     Mouth: Mucous membranes are moist.     Pharynx: Oropharynx is clear.  Eyes:     Conjunctiva/sclera: Conjunctivae normal.     Pupils: Pupils are equal, round, and reactive to light.  Cardiovascular:     Rate and Rhythm: Normal rate and regular rhythm.      Pulses: Normal pulses.     Heart sounds: Normal heart sounds.  Pulmonary:     Effort: Pulmonary effort is normal.     Breath sounds: Normal breath sounds.  Abdominal:     General: Bowel sounds are normal.     Palpations: Abdomen is soft.  Musculoskeletal:        General: Normal range of motion.     Cervical back: Normal range of motion.  Skin:    General: Skin is warm and dry.  Neurological:     General: No focal deficit present.     Mental Status: He is alert and oriented to person, place, and time.  Psychiatric:        Mood and Affect: Mood normal.        Behavior: Behavior normal.        Judgment: Judgment normal.      Results for orders placed or performed in visit on 04/17/23  POCT CBG (Fasting - Glucose)  Result Value Ref Range   Glucose Fasting, POC 101 (A) 70 - 99 mg/dL    Recent Results (from the past 2160 hours)  Hemoglobin A1c     Status: Abnormal   Collection Time: 04/14/23 11:30 AM  Result Value Ref Range   Hgb A1c MFr Bld 7.6 (H) 4.8 - 5.6 %    Comment:          Prediabetes: 5.7 - 6.4          Diabetes: >6.4          Glycemic control for adults with diabetes: <7.0    Est. average glucose Bld gHb Est-mCnc 171 mg/dL  [id number]     Status: None   Collection Time: 04/14/23 11:30 AM  Result Value Ref Range   Glucose 72 70 - 99 mg/dL  BUN 13 6 - 24 mg/dL   Creatinine, Ser 4.09 0.76 - 1.27 mg/dL   eGFR 811 >91 YN/WGN/5.62   BUN/Creatinine Ratio 16 9 - 20   Sodium 139 134 - 144 mmol/L   Potassium 3.8 3.5 - 5.2 mmol/L   Chloride 99 96 - 106 mmol/L   CO2 21 20 - 29 mmol/L   Calcium 9.5 8.7 - 10.2 mg/dL   Total Protein 6.6 6.0 - 8.5 g/dL   Albumin 4.3 3.8 - 4.9 g/dL   Globulin, Total 2.3 1.5 - 4.5 g/dL   Bilirubin Total 0.4 0.0 - 1.2 mg/dL   Alkaline Phosphatase 76 44 - 121 IU/L   AST 16 0 - 40 IU/L   ALT 18 0 - 44 IU/L  Lipid Panel w/o Chol/HDL Ratio     Status: Abnormal   Collection Time: 04/14/23 11:30 AM  Result Value Ref Range    Cholesterol, Total 101 100 - 199 mg/dL   Triglycerides 130 0 - 149 mg/dL   HDL 25 (L) >86 mg/dL   VLDL Cholesterol Cal 19 5 - 40 mg/dL   LDL Chol Calc (NIH) 57 0 - 99 mg/dL  CBC with Diff     Status: Abnormal   Collection Time: 04/14/23 11:30 AM  Result Value Ref Range   WBC 13.8 (H) 3.4 - 10.8 x10E3/uL   RBC 5.27 4.14 - 5.80 x10E6/uL   Hemoglobin 15.4 13.0 - 17.7 g/dL   Hematocrit 57.8 46.9 - 51.0 %   MCV 87 79 - 97 fL   MCH 29.2 26.6 - 33.0 pg   MCHC 33.7 31.5 - 35.7 g/dL   RDW 62.9 52.8 - 41.3 %   Platelets 344 150 - 450 x10E3/uL   Neutrophils 78 Not Estab. %   Lymphs 16 Not Estab. %   Monocytes 5 Not Estab. %   Eos 1 Not Estab. %   Basos 0 Not Estab. %   Neutrophils Absolute 10.6 (H) 1.4 - 7.0 x10E3/uL   Lymphocytes Absolute 2.2 0.7 - 3.1 x10E3/uL   Monocytes Absolute 0.7 0.1 - 0.9 x10E3/uL   EOS (ABSOLUTE) 0.2 0.0 - 0.4 x10E3/uL   Basophils Absolute 0.1 0.0 - 0.2 x10E3/uL   Immature Granulocytes 0 Not Estab. %   Immature Grans (Abs) 0.0 0.0 - 0.1 x10E3/uL  POCT CBG (Fasting - Glucose)     Status: Abnormal   Collection Time: 04/17/23 10:06 AM  Result Value Ref Range   Glucose Fasting, POC 101 (A) 70 - 99 mg/dL      Assessment & Plan:  Start mealtime Humalog sliding scale coverage.  Cut back his his basal insulin to 45 units at night.  May need to further reduce it.  Patient advised to continue checking fasting and mealtime insulin. Problem List Items Addressed This Visit     Poorly controlled type 2 diabetes mellitus with peripheral neuropathy (HCC)   Relevant Medications   insulin lispro (HUMALOG KWIKPEN) 200 UNIT/ML KwikPen   Other Relevant Orders   POCT CBG (Fasting - Glucose) (Completed)   Nicotine abuse   Hypertension associated with diabetes (HCC) - Primary   Relevant Medications   insulin lispro (HUMALOG KWIKPEN) 200 UNIT/ML KwikPen   Combined hyperlipidemia associated with type 2 diabetes mellitus (HCC)   Relevant Medications   insulin lispro (HUMALOG  KWIKPEN) 200 UNIT/ML KwikPen   Diabetic polyneuropathy associated with diabetes mellitus due to underlying condition (HCC)   Relevant Medications   insulin lispro (HUMALOG KWIKPEN) 200 UNIT/ML KwikPen   Other Visit Diagnoses  Heart palpitations       Relevant Orders   TSH+T4F+T3Free     Skin lesion of left arm       Relevant Orders   Ambulatory referral to Dermatology       Return in about 2 weeks (around 05/01/2023).   Total time spent: 30 minutes  Margaretann Loveless, MD  04/17/2023   This document may have been prepared by [facility] Voice Recognition software and as such may include unintentional dictation errors.

## 2023-04-19 ENCOUNTER — Other Ambulatory Visit: Payer: Self-pay

## 2023-04-21 ENCOUNTER — Other Ambulatory Visit: Payer: Self-pay

## 2023-04-21 MED FILL — Atorvastatin Calcium Tab 80 MG (Base Equivalent): ORAL | 90 days supply | Qty: 90 | Fill #2 | Status: CN

## 2023-04-21 MED FILL — Pantoprazole Sodium EC Tab 40 MG (Base Equiv): ORAL | 90 days supply | Qty: 90 | Fill #0 | Status: CN

## 2023-04-21 MED FILL — Dapagliflozin Propanediol Tab 10 MG (Base Equivalent): ORAL | 30 days supply | Qty: 30 | Fill #1 | Status: CN

## 2023-04-22 ENCOUNTER — Other Ambulatory Visit: Payer: Self-pay

## 2023-04-30 ENCOUNTER — Other Ambulatory Visit: Payer: Self-pay | Admitting: Internal Medicine

## 2023-04-30 ENCOUNTER — Other Ambulatory Visit: Payer: Self-pay

## 2023-04-30 DIAGNOSIS — E1142 Type 2 diabetes mellitus with diabetic polyneuropathy: Secondary | ICD-10-CM

## 2023-05-01 ENCOUNTER — Other Ambulatory Visit: Payer: Self-pay

## 2023-05-01 ENCOUNTER — Ambulatory Visit: Admitting: Internal Medicine

## 2023-05-01 MED ORDER — GABAPENTIN 100 MG PO CAPS
100.0000 mg | ORAL_CAPSULE | Freq: Every day | ORAL | 2 refills | Status: AC
  Filled 2023-05-01 – 2023-07-26 (×2): qty 90, 90d supply, fill #0
  Filled 2023-10-24: qty 90, 90d supply, fill #1
  Filled 2024-01-24: qty 90, 90d supply, fill #2

## 2023-05-04 ENCOUNTER — Other Ambulatory Visit: Payer: Self-pay

## 2023-05-04 MED FILL — Lancets: 25 days supply | Qty: 100 | Fill #7 | Status: AC

## 2023-05-05 ENCOUNTER — Ambulatory Visit: Admitting: Internal Medicine

## 2023-05-08 ENCOUNTER — Other Ambulatory Visit: Payer: Self-pay

## 2023-05-09 ENCOUNTER — Other Ambulatory Visit: Payer: Self-pay

## 2023-05-09 MED FILL — Pantoprazole Sodium EC Tab 40 MG (Base Equiv): ORAL | 90 days supply | Qty: 90 | Fill #0 | Status: AC

## 2023-05-09 MED FILL — Chlorthalidone Tab 25 MG: ORAL | 30 days supply | Qty: 30 | Fill #2 | Status: AC

## 2023-05-09 MED FILL — Atorvastatin Calcium Tab 80 MG (Base Equivalent): ORAL | 90 days supply | Qty: 90 | Fill #2 | Status: AC

## 2023-05-11 ENCOUNTER — Other Ambulatory Visit: Payer: Self-pay

## 2023-05-11 ENCOUNTER — Other Ambulatory Visit: Payer: Self-pay | Admitting: Internal Medicine

## 2023-05-11 DIAGNOSIS — E1165 Type 2 diabetes mellitus with hyperglycemia: Secondary | ICD-10-CM

## 2023-05-12 ENCOUNTER — Encounter: Payer: Self-pay | Admitting: Internal Medicine

## 2023-05-12 ENCOUNTER — Ambulatory Visit
Admission: RE | Admit: 2023-05-12 | Discharge: 2023-05-12 | Disposition: A | Discharge status: Home / Self Care | Source: Ambulatory Visit | Attending: Internal Medicine | Admitting: Internal Medicine

## 2023-05-12 ENCOUNTER — Other Ambulatory Visit: Payer: Self-pay

## 2023-05-12 ENCOUNTER — Ambulatory Visit (INDEPENDENT_AMBULATORY_CARE_PROVIDER_SITE_OTHER): Admitting: Internal Medicine

## 2023-05-12 ENCOUNTER — Ambulatory Visit
Admission: RE | Admit: 2023-05-12 | Discharge: 2023-05-12 | Disposition: A | Discharge status: Home / Self Care | Attending: Internal Medicine | Admitting: Internal Medicine

## 2023-05-12 VITALS — BP 110/78 | HR 109 | Ht 69.0 in | Wt 171.0 lb

## 2023-05-12 DIAGNOSIS — K219 Gastro-esophageal reflux disease without esophagitis: Secondary | ICD-10-CM | POA: Diagnosis not present

## 2023-05-12 DIAGNOSIS — M545 Low back pain, unspecified: Secondary | ICD-10-CM | POA: Insufficient documentation

## 2023-05-12 DIAGNOSIS — E1169 Type 2 diabetes mellitus with other specified complication: Secondary | ICD-10-CM

## 2023-05-12 DIAGNOSIS — M47816 Spondylosis without myelopathy or radiculopathy, lumbar region: Secondary | ICD-10-CM | POA: Diagnosis not present

## 2023-05-12 DIAGNOSIS — I152 Hypertension secondary to endocrine disorders: Secondary | ICD-10-CM | POA: Diagnosis not present

## 2023-05-12 DIAGNOSIS — E782 Mixed hyperlipidemia: Secondary | ICD-10-CM

## 2023-05-12 DIAGNOSIS — E1159 Type 2 diabetes mellitus with other circulatory complications: Secondary | ICD-10-CM

## 2023-05-12 DIAGNOSIS — E119 Type 2 diabetes mellitus without complications: Secondary | ICD-10-CM

## 2023-05-12 MED ORDER — BACLOFEN 10 MG PO TABS
10.0000 mg | ORAL_TABLET | Freq: Every day | ORAL | 1 refills | Status: DC
Start: 2023-05-12 — End: 2023-09-28
  Filled 2023-05-12: qty 30, 30d supply, fill #0
  Filled 2023-06-06: qty 30, 30d supply, fill #1

## 2023-05-12 MED ORDER — METHYLPREDNISOLONE 4 MG PO TBPK
ORAL_TABLET | ORAL | 0 refills | Status: AC
  Filled 2023-05-12: qty 21, 6d supply, fill #0

## 2023-05-12 MED ORDER — OZEMPIC (0.25 OR 0.5 MG/DOSE) 2 MG/3ML ~~LOC~~ SOPN
0.5000 mg | PEN_INJECTOR | SUBCUTANEOUS | 2 refills | Status: DC
  Filled 2023-05-12: qty 3, 28d supply, fill #0
  Filled 2023-06-04: qty 3, 28d supply, fill #1

## 2023-05-12 MED ORDER — INSULIN PEN NEEDLE 31G X 6 MM MISC
1.0000 | Freq: Two times a day (BID) | 1 refills | Status: DC | PRN
  Filled 2023-05-12: qty 100, 50d supply, fill #0

## 2023-05-12 MED FILL — Dapagliflozin Propanediol Tab 10 MG (Base Equivalent): ORAL | 30 days supply | Qty: 30 | Fill #1 | Status: CN

## 2023-05-12 NOTE — Progress Notes (Signed)
 Established Patient Office Visit  Subjective:  Patient ID: Henry Anderson, male    DOB: Feb 12, 1965  Age: 58 y.o. MRN: [id number]  Chief Complaint  Patient presents with   Follow-up    2 week follow up    Patient comes in for his follow-up today.  He has been started on Ozempic  0.25 mg/week.  Today the dose will be increased to 0.5 mg/week.  He is tolerating it well and does not have any nausea, vomiting or constipation. However he reports that few days ago he woke up in the morning and found himself sleeping in an uncomfortable position.  Since then he has been having pain in across his lower back.  It does not radiate down his thighs or legs, there is no tingling or numbness in his feet.  There is a point tenderness in the middle of the spine and lower lumbar area. There are no urinary complaints, no dysuria and no hematuria.   Will check x-ray of LS spine. He has not taken any Motrin or Aleve or Tylenol .  Has difficulty getting up from the chair.   Will give trial  of Medrol  Dosepak and baclofen .    No other concerns at this time.   Past Medical History:  Diagnosis Date   Diabetes mellitus without complication (HCC)    Emphysema of lung (HCC)    Hypertension    Stroke [facility])     Past Surgical History:  Procedure Laterality Date   FOOT SURGERY Right    KNEE SURGERY Right    SHOULDER SURGERY Left     Social History   Socioeconomic History   Marital status: Married    Spouse name: Not on file   Number of children: Not on file   Years of education: Not on file   Highest education level: Not on file  Occupational History   Not on file  Tobacco Use   Smoking status: Every Day    Current packs/day: 1.50    Average packs/day: 1.5 packs/day for 30.0 years (45.0 ttl pk-yrs)    Types: Cigarettes   Smokeless tobacco: Former    Types: Chew   Tobacco comments:    Patient has cut down to ~1 ppd since having stroke. Gave [place] Quit info. Patient down to 1/2 ppd  Vaping Use    Vaping status: Never Used  Substance and Sexual Activity   Alcohol use: Yes    Comment: occasionally 1-2 beers   Drug use: Never   Sexual activity: Not on file  Other Topics Concern   Not on file  Social History Narrative   Not on file   Social Drivers of Health   Financial Resource Strain: High Risk (11/23/2021)   Overall Financial Resource Strain (CARDIA)    Difficulty of Paying Living Expenses: Very hard  Food Insecurity: Food Insecurity Present (10/27/2022)   Hunger Vital Sign    Worried About Running Out of Food in the Last Year: Often true    Ran Out of Food in the Last Year: Often true  Transportation Needs: No Transportation Needs (10/27/2022)   PRAPARE - Administrator, Civil Service (Medical): No    Lack of Transportation (Non-Medical): No  Physical Activity: Inactive (11/23/2021)   Exercise Vital Sign    Days of Exercise per Week: 0 days    Minutes of Exercise per Session: 0 min  Stress: Stress Concern Present (11/23/2021)   [employer] of Occupational Health - Occupational Stress Questionnaire  Feeling of Stress : Very much  Social Connections: Socially Isolated (11/23/2021)   Social Connection and Isolation Panel [NHANES]    Frequency of Communication with Friends and Family: Never    Frequency of Social Gatherings with Friends and Family: Never    Attends Religious Services: Never    Database administrator or Organizations: No    Attends Banker Meetings: Never    Marital Status: Married  Catering manager Violence: Not At Risk (10/27/2022)   Humiliation, Afraid, Rape, and Kick questionnaire    Fear of Current or Ex-Partner: No    Emotionally Abused: No    Physically Abused: No    Sexually Abused: No    Family History  Problem Relation Age of Onset   COPD Mother    Diabetes Father    Diabetes Sister    Diabetes Sister    COPD Maternal Grandmother    Heart attack Maternal Grandfather    Diabetes Paternal Grandmother     Hyperlipidemia Paternal Grandmother    Hypertension Paternal Grandmother    Diabetes Paternal Grandfather    Hyperlipidemia Paternal Grandfather    Hypertension Paternal Grandfather     No Known Allergies  Outpatient Medications Prior to Visit  Medication Sig   acetaminophen  (TYLENOL ) 325 MG tablet Take 2 tablets (650 mg total) by mouth every 4 (four) hours as needed for mild pain (or temp > 37.5 C (99.5 F)).   albuterol  (PROAIR  HFA) 108 (90 Base) MCG/ACT inhaler Inhale 2 puffs into the lungs every 6 (six) hours as needed for wheezing or shortness of breath.   Aspirin  (VAZALORE ) 81 MG CAPS Take 1 tablet by mouth daily.   atorvastatin  (LIPITOR) 80 MG tablet Take 1 tablet (80 mg total) by mouth daily.   blood glucose meter kit and supplies KIT Use as directed   chlorthalidone  (HYGROTON ) 25 MG tablet Take 1 tablet (25 mg total) by mouth once daily.   Cholecalciferol  (VITAMIN D3) 1.25 MG (50000 UT) CAPS Take 1 capsule (1.25 mg total) by mouth once a week.   clopidogrel  (PLAVIX ) 75 MG tablet Take 1 tablet (75 mg total) by mouth once daily.   dapagliflozin  propanediol (FARXIGA ) 10 MG TABS tablet Take 1 tablet (10 mg total) by mouth daily before breakfast.   gabapentin  (NEURONTIN ) 100 MG capsule Take 1 capsule (100 mg total) by mouth at bedtime.   glucose blood (ACCU-CHEK GUIDE TEST) test strip Use to check blood glucose up to 4 times a day   Insulin  Glargine (BASAGLAR  KWIKPEN) 100 UNIT/ML Inject 45 Units into the skin daily.   insulin  lispro (HUMALOG  KWIKPEN) 200 UNIT/ML KwikPen No insulin  if blood sugar less than 150 3 units if 151-200 6 units if 201-250 9 units if 251-300 12 units of 301-350   Insulin  Pen Needle (TRUEPLUS 5-BEVEL PEN NEEDLES) 32G X 4 MM MISC Inject 1 Needle with Basaglar  as directed daily.   lisinopril  (ZESTRIL ) 20 MG tablet Take 1 tablet (20 mg total) by mouth once daily.   metFORMIN  (GLUCOPHAGE -XR) 500 MG 24 hr tablet Take 2 tablets (1,000 mg total) by mouth daily with  breakfast.   mometasone -formoterol  (DULERA ) 100-5 MCG/ACT AERO Inhale 2 puffs into the lungs 2 (two) times daily.   naproxen sodium (ALEVE) 220 MG tablet Take 220 mg by mouth daily as needed.   pantoprazole  (PROTONIX ) 40 MG tablet Take 1 tablet (40 mg total) by mouth daily.   Rightest GL300 Lancets MISC Use up to four times daily as directed to check blood  sugar.   Semaglutide ,0.25 or 0.5MG /DOS, (OZEMPIC , 0.25 OR 0.5 MG/DOSE,) 2 MG/3ML SOPN Inject 0.5 mg into the skin once a week.   No facility-administered medications prior to visit.    Review of Systems  Constitutional: Negative.  Negative for chills, fever, malaise/fatigue and weight loss.  HENT: Negative.  Negative for sore throat.   Eyes: Negative.   Respiratory: Negative.  Negative for cough and shortness of breath.   Cardiovascular: Negative.  Negative for chest pain, palpitations and leg swelling.  Gastrointestinal: Negative.  Negative for abdominal pain, constipation, diarrhea, heartburn, nausea and vomiting.  Genitourinary: Negative.  Negative for dysuria and flank pain.  Musculoskeletal:  Positive for back pain. Negative for joint pain and myalgias.  Skin: Negative.   Neurological: Negative.  Negative for dizziness, tingling, tremors, sensory change and headaches.  Endo/Heme/Allergies: Negative.   Psychiatric/Behavioral: Negative.  Negative for depression and suicidal ideas. The patient is not nervous/anxious.        Objective:   BP 110/78   Pulse (!) 109   Ht 5\' 9"  (1.753 m)   Wt 171 lb (77.6 kg)   SpO2 97%   BMI 25.25 kg/m   Vitals:   05/12/23 1039  BP: 110/78  Pulse: (!) 109  Height: 5\' 9"  (1.753 m)  Weight: 171 lb (77.6 kg)  SpO2: 97%  BMI (Calculated): 25.24    Physical Exam Vitals and nursing note reviewed.  Constitutional:      Appearance: Normal appearance.  HENT:     Head: Normocephalic and atraumatic.     Nose: Nose normal.     Mouth/Throat:     Mouth: Mucous membranes are moist.      Pharynx: Oropharynx is clear.  Eyes:     Conjunctiva/sclera: Conjunctivae normal.     Pupils: Pupils are equal, round, and reactive to light.  Cardiovascular:     Rate and Rhythm: Normal rate and regular rhythm.     Pulses: Normal pulses.     Heart sounds: Normal heart sounds.  Pulmonary:     Effort: Pulmonary effort is normal.     Breath sounds: Normal breath sounds.  Abdominal:     General: Bowel sounds are normal.     Palpations: Abdomen is soft.  Musculoskeletal:        General: No tenderness (Lower back). Normal range of motion.     Cervical back: Normal range of motion.  Skin:    General: Skin is warm and dry.  Neurological:     General: No focal deficit present.     Mental Status: He is alert and oriented to person, place, and time.  Psychiatric:        Mood and Affect: Mood normal.        Behavior: Behavior normal.        Judgment: Judgment normal.      No results found for any visits on 05/12/23.  Recent Results (from the past 2160 hours)  Hemoglobin A1c     Status: Abnormal   Collection Time: 04/14/23 11:30 AM  Result Value Ref Range   Hgb A1c MFr Bld 7.6 (H) 4.8 - 5.6 %    Comment:          Prediabetes: 5.7 - 6.4          Diabetes: >6.4          Glycemic control for adults with diabetes: <7.0    Est. average glucose Bld gHb Est-mCnc 171 mg/dL  [id number]     Status: None  Collection Time: 04/14/23 11:30 AM  Result Value Ref Range   Glucose 72 70 - 99 mg/dL   BUN 13 6 - 24 mg/dL   Creatinine, Ser 0.17 0.76 - 1.27 mg/dL   eGFR 510 >25 EN/IDP/8.24   BUN/Creatinine Ratio 16 9 - 20   Sodium 139 134 - 144 mmol/L   Potassium 3.8 3.5 - 5.2 mmol/L   Chloride 99 96 - 106 mmol/L   CO2 21 20 - 29 mmol/L   Calcium  9.5 8.7 - 10.2 mg/dL   Total Protein 6.6 6.0 - 8.5 g/dL   Albumin 4.3 3.8 - 4.9 g/dL   Globulin, Total 2.3 1.5 - 4.5 g/dL   Bilirubin Total 0.4 0.0 - 1.2 mg/dL   Alkaline Phosphatase 76 44 - 121 IU/L   AST 16 0 - 40 IU/L   ALT 18 0 - 44 IU/L   Lipid Panel w/o Chol/HDL Ratio     Status: Abnormal   Collection Time: 04/14/23 11:30 AM  Result Value Ref Range   Cholesterol, Total 101 100 - 199 mg/dL   Triglycerides 235 0 - 149 mg/dL   HDL 25 (L) >36 mg/dL   VLDL Cholesterol Cal 19 5 - 40 mg/dL   LDL Chol Calc (NIH) 57 0 - 99 mg/dL  CBC with Diff     Status: Abnormal   Collection Time: 04/14/23 11:30 AM  Result Value Ref Range   WBC 13.8 (H) 3.4 - 10.8 x10E3/uL   RBC 5.27 4.14 - 5.80 x10E6/uL   Hemoglobin 15.4 13.0 - 17.7 g/dL   Hematocrit 14.4 31.5 - 51.0 %   MCV 87 79 - 97 fL   MCH 29.2 26.6 - 33.0 pg   MCHC 33.7 31.5 - 35.7 g/dL   RDW 40.0 86.7 - 61.9 %   Platelets 344 150 - 450 x10E3/uL   Neutrophils 78 Not Estab. %   Lymphs 16 Not Estab. %   Monocytes 5 Not Estab. %   Eos 1 Not Estab. %   Basos 0 Not Estab. %   Neutrophils Absolute 10.6 (H) 1.4 - 7.0 x10E3/uL   Lymphocytes Absolute 2.2 0.7 - 3.1 x10E3/uL   Monocytes Absolute 0.7 0.1 - 0.9 x10E3/uL   EOS (ABSOLUTE) 0.2 0.0 - 0.4 x10E3/uL   Basophils Absolute 0.1 0.0 - 0.2 x10E3/uL   Immature Granulocytes 0 Not Estab. %   Immature Grans (Abs) 0.0 0.0 - 0.1 x10E3/uL  POCT CBG (Fasting - Glucose)     Status: Abnormal   Collection Time: 04/17/23 10:06 AM  Result Value Ref Range   Glucose Fasting, POC 101 (A) 70 - 99 mg/dL      Assessment & Plan:  Start Medrol  Dosepak and baclofen .  Will call with x-ray results.  Ozempic  increased to 0.5 mg/week. Problem List Items Addressed This Visit     GERD (gastroesophageal reflux disease)   Hypertension associated with diabetes (HCC)   Combined hyperlipidemia associated with type 2 diabetes mellitus (HCC)   Type 2 diabetes mellitus without complication, without long-term current use of insulin  (HCC)   Relevant Medications   Insulin  Pen Needle 31G X 6 MM MISC   Other Visit Diagnoses       Acute midline low back pain without sciatica    -  Primary   Relevant Medications   methylPREDNISolone  (MEDROL  DOSEPAK) 4 MG TBPK  tablet   baclofen  (LIORESAL ) 10 MG tablet   Other Relevant Orders   DG Lumbar Spine Complete       Return in about  4 weeks (around 06/09/2023).   Total time spent: 30 minutes  Aisha Hove, MD  05/12/2023   This document may have been prepared by [facility] Voice Recognition software and as such may include unintentional dictation errors.

## 2023-05-13 ENCOUNTER — Other Ambulatory Visit: Payer: Self-pay

## 2023-05-15 ENCOUNTER — Telehealth: Payer: Self-pay | Admitting: Internal Medicine

## 2023-05-15 NOTE — Telephone Encounter (Signed)
 [facility] left VM that patient requested a new Rx from them for Lantus  Solostar Pens. I don't see this on his med list. Should he be taking this?

## 2023-05-15 NOTE — Progress Notes (Signed)
 Patient notified

## 2023-05-25 ENCOUNTER — Other Ambulatory Visit: Payer: Self-pay

## 2023-05-29 ENCOUNTER — Other Ambulatory Visit: Payer: Self-pay

## 2023-06-02 ENCOUNTER — Other Ambulatory Visit: Payer: Self-pay | Admitting: Internal Medicine

## 2023-06-02 DIAGNOSIS — E119 Type 2 diabetes mellitus without complications: Secondary | ICD-10-CM

## 2023-06-02 MED FILL — Lancets: 25 days supply | Qty: 100 | Fill #8 | Status: AC

## 2023-06-02 MED FILL — Metformin HCl Tab ER 24HR 500 MG: ORAL | 90 days supply | Qty: 180 | Fill #1 | Status: AC

## 2023-06-02 MED FILL — "Insulin Pen Needle 32 G X 4 MM (1/6"" or 5/32"")": 90 days supply | Qty: 100 | Fill #1 | Status: AC

## 2023-06-04 ENCOUNTER — Other Ambulatory Visit: Payer: Self-pay

## 2023-06-05 MED ORDER — ACCU-CHEK GUIDE TEST VI STRP
ORAL_STRIP | 3 refills | Status: AC
  Filled 2023-06-05: qty 100, 25d supply, fill #0
  Filled 2023-06-29: qty 100, 25d supply, fill #1
  Filled 2023-07-26: qty 100, 25d supply, fill #2
  Filled 2023-08-31: qty 100, 25d supply, fill #3

## 2023-06-06 ENCOUNTER — Other Ambulatory Visit: Payer: Self-pay

## 2023-06-06 ENCOUNTER — Other Ambulatory Visit: Payer: Self-pay | Admitting: Internal Medicine

## 2023-06-06 DIAGNOSIS — I10 Essential (primary) hypertension: Secondary | ICD-10-CM

## 2023-06-06 MED ORDER — CHLORTHALIDONE 25 MG PO TABS
25.0000 mg | ORAL_TABLET | Freq: Every day | ORAL | 2 refills | Status: AC
  Filled 2023-06-06: qty 90, 90d supply, fill #0
  Filled 2023-09-28: qty 90, 90d supply, fill #1
  Filled 2023-12-27: qty 90, 90d supply, fill #2

## 2023-06-12 ENCOUNTER — Other Ambulatory Visit: Payer: Self-pay

## 2023-06-14 ENCOUNTER — Other Ambulatory Visit: Payer: Self-pay

## 2023-06-15 ENCOUNTER — Ambulatory Visit: Admitting: Internal Medicine

## 2023-06-15 ENCOUNTER — Other Ambulatory Visit: Payer: Self-pay

## 2023-06-15 ENCOUNTER — Encounter: Payer: Self-pay | Admitting: Internal Medicine

## 2023-06-15 VITALS — BP 104/72 | HR 111 | Ht 69.0 in | Wt 173.8 lb

## 2023-06-15 DIAGNOSIS — Z794 Long term (current) use of insulin: Secondary | ICD-10-CM | POA: Diagnosis not present

## 2023-06-15 DIAGNOSIS — E782 Mixed hyperlipidemia: Secondary | ICD-10-CM

## 2023-06-15 DIAGNOSIS — E1169 Type 2 diabetes mellitus with other specified complication: Secondary | ICD-10-CM | POA: Diagnosis not present

## 2023-06-15 DIAGNOSIS — E1159 Type 2 diabetes mellitus with other circulatory complications: Secondary | ICD-10-CM | POA: Diagnosis not present

## 2023-06-15 DIAGNOSIS — I152 Hypertension secondary to endocrine disorders: Secondary | ICD-10-CM | POA: Diagnosis not present

## 2023-06-15 DIAGNOSIS — E1142 Type 2 diabetes mellitus with diabetic polyneuropathy: Secondary | ICD-10-CM

## 2023-06-15 DIAGNOSIS — E1165 Type 2 diabetes mellitus with hyperglycemia: Secondary | ICD-10-CM

## 2023-06-15 DIAGNOSIS — K219 Gastro-esophageal reflux disease without esophagitis: Secondary | ICD-10-CM | POA: Diagnosis not present

## 2023-06-15 LAB — POCT CBG (FASTING - GLUCOSE)-MANUAL ENTRY: Glucose Fasting, POC: 178 mg/dL — AB (ref 70–99)

## 2023-06-15 MED ORDER — OZEMPIC (1 MG/DOSE) 4 MG/3ML ~~LOC~~ SOPN
1.0000 mg | PEN_INJECTOR | SUBCUTANEOUS | 2 refills | Status: DC
  Filled 2023-06-15 – 2023-06-28 (×4): qty 3, 28d supply, fill #0
  Filled 2023-08-07: qty 3, 28d supply, fill #1

## 2023-06-15 NOTE — Progress Notes (Signed)
 Established Patient Office Visit  Subjective:  Patient ID: Henry Anderson, male    DOB: 28-Feb-1965  Age: 58 y.o. MRN: [id number]  Chief Complaint  Patient presents with   Follow-up    4 week follow up    Patient comes in for his follow up. He is currently on Ozempic  0.5 mg /week and is tolerating it wel. Denies nausea or vomiting , no abdominal pain, no constipation, but did not lose much weight either. Will increase to 1 mg/week  Labs will be due next visit.    No other concerns at this time.   Past Medical History:  Diagnosis Date   Diabetes mellitus without complication (HCC)    Emphysema of lung (HCC)    Hypertension    Stroke [facility])     Past Surgical History:  Procedure Laterality Date   FOOT SURGERY Right    KNEE SURGERY Right    SHOULDER SURGERY Left     Social History   Socioeconomic History   Marital status: Married    Spouse name: Not on file   Number of children: Not on file   Years of education: Not on file   Highest education level: Not on file  Occupational History   Not on file  Tobacco Use   Smoking status: Every Day    Current packs/day: 1.50    Average packs/day: 1.5 packs/day for 30.0 years (45.0 ttl pk-yrs)    Types: Cigarettes   Smokeless tobacco: Former    Types: Chew   Tobacco comments:    Patient has cut down to ~1 ppd since having stroke. Gave [place] Quit info. Patient down to 1/2 ppd  Vaping Use   Vaping status: Never Used  Substance and Sexual Activity   Alcohol use: Yes    Comment: occasionally 1-2 beers   Drug use: Never   Sexual activity: Not on file  Other Topics Concern   Not on file  Social History Narrative   Not on file   Social Drivers of Health   Financial Resource Strain: High Risk (11/23/2021)   Overall Financial Resource Strain (CARDIA)    Difficulty of Paying Living Expenses: Very hard  Food Insecurity: Food Insecurity Present (10/27/2022)   Hunger Vital Sign    Worried About Running Out of Food in the Last  Year: Often true    Ran Out of Food in the Last Year: Often true  Transportation Needs: No Transportation Needs (10/27/2022)   PRAPARE - Administrator, Civil Service (Medical): No    Lack of Transportation (Non-Medical): No  Physical Activity: Inactive (11/23/2021)   Exercise Vital Sign    Days of Exercise per Week: 0 days    Minutes of Exercise per Session: 0 min  Stress: Stress Concern Present (11/23/2021)   [employer] of Occupational Health - Occupational Stress Questionnaire    Feeling of Stress : Very much  Social Connections: Socially Isolated (11/23/2021)   Social Connection and Isolation Panel [NHANES]    Frequency of Communication with Friends and Family: Never    Frequency of Social Gatherings with Friends and Family: Never    Attends Religious Services: Never    Database administrator or Organizations: No    Attends Banker Meetings: Never    Marital Status: Married  Catering manager Violence: Not At Risk (10/27/2022)   Humiliation, Afraid, Rape, and Kick questionnaire    Fear of Current or Ex-Partner: No    Emotionally Abused: No  Physically Abused: No    Sexually Abused: No    Family History  Problem Relation Age of Onset   COPD Mother    Diabetes Father    Diabetes Sister    Diabetes Sister    COPD Maternal Grandmother    Heart attack Maternal Grandfather    Diabetes Paternal Grandmother    Hyperlipidemia Paternal Grandmother    Hypertension Paternal Grandmother    Diabetes Paternal Grandfather    Hyperlipidemia Paternal Grandfather    Hypertension Paternal Grandfather     No Known Allergies  Outpatient Medications Prior to Visit  Medication Sig   acetaminophen  (TYLENOL ) 325 MG tablet Take 2 tablets (650 mg total) by mouth every 4 (four) hours as needed for mild pain (or temp > 37.5 C (99.5 F)).   albuterol  (PROAIR  HFA) 108 (90 Base) MCG/ACT inhaler Inhale 2 puffs into the lungs every 6 (six) hours as needed for  wheezing or shortness of breath.   Aspirin  (VAZALORE ) 81 MG CAPS Take 1 tablet by mouth daily.   atorvastatin  (LIPITOR) 80 MG tablet Take 1 tablet (80 mg total) by mouth daily.   baclofen  (LIORESAL ) 10 MG tablet Take 1 tablet (10 mg total) by mouth daily.   blood glucose meter kit and supplies KIT Use as directed   chlorthalidone  (HYGROTON ) 25 MG tablet Take 1 tablet (25 mg total) by mouth once daily.   Cholecalciferol  (VITAMIN D3) 1.25 MG (50000 UT) CAPS Take 1 capsule (1.25 mg total) by mouth once a week.   clopidogrel  (PLAVIX ) 75 MG tablet Take 1 tablet (75 mg total) by mouth once daily.   gabapentin  (NEURONTIN ) 100 MG capsule Take 1 capsule (100 mg total) by mouth at bedtime.   glucose blood (ACCU-CHEK GUIDE TEST) test strip Use to check blood glucose up to 4 times a day   Insulin  Glargine (BASAGLAR  KWIKPEN) 100 UNIT/ML Inject 45 Units into the skin daily.   insulin  lispro (HUMALOG  KWIKPEN) 200 UNIT/ML KwikPen No insulin  if blood sugar less than 150 3 units if 151-200 6 units if 201-250 9 units if 251-300 12 units of 301-350   Insulin  Pen Needle (TRUEPLUS 5-BEVEL PEN NEEDLES) 32G X 4 MM MISC Inject 1 Needle with Basaglar  as directed daily.   Insulin  Pen Needle 31G X 6 MM MISC 1 each by Does not apply route 2 (two) times daily as needed.   lisinopril  (ZESTRIL ) 20 MG tablet Take 1 tablet (20 mg total) by mouth once daily.   metFORMIN  (GLUCOPHAGE -XR) 500 MG 24 hr tablet Take 2 tablets (1,000 mg total) by mouth daily with breakfast.   mometasone -formoterol  (DULERA ) 100-5 MCG/ACT AERO Inhale 2 puffs into the lungs 2 (two) times daily.   naproxen sodium (ALEVE) 220 MG tablet Take 220 mg by mouth daily as needed.   pantoprazole  (PROTONIX ) 40 MG tablet Take 1 tablet (40 mg total) by mouth daily.   Rightest GL300 Lancets MISC Use up to four times daily as directed to check blood sugar.   [DISCONTINUED] Semaglutide ,0.25 or 0.5MG /DOS, (OZEMPIC , 0.25 OR 0.5 MG/DOSE,) 2 MG/3ML SOPN Inject 0.5 mg into  the skin once a week.   dapagliflozin  propanediol (FARXIGA ) 10 MG TABS tablet Take 1 tablet (10 mg total) by mouth daily before breakfast. (Patient not taking: Reported on 06/15/2023)   No facility-administered medications prior to visit.    Review of Systems  Constitutional: Negative.  Negative for chills, diaphoresis, fever, malaise/fatigue and weight loss.  HENT: Negative.  Negative for congestion.   Eyes: Negative.  Respiratory: Negative.  Negative for cough and shortness of breath.   Cardiovascular: Negative.  Negative for chest pain, palpitations and leg swelling.  Gastrointestinal: Negative.  Negative for abdominal pain, constipation, diarrhea, heartburn, nausea and vomiting.  Genitourinary: Negative.  Negative for dysuria and flank pain.  Musculoskeletal: Negative.  Negative for joint pain and myalgias.  Skin: Negative.   Neurological: Negative.  Negative for dizziness, tingling, tremors, sensory change and headaches.  Endo/Heme/Allergies: Negative.   Psychiatric/Behavioral: Negative.  Negative for depression and suicidal ideas. The patient is not nervous/anxious.        Objective:   BP 104/72   Pulse (!) 111   Ht 5\' 9"  (1.753 m)   Wt 173 lb 12.8 oz (78.8 kg)   SpO2 97%   BMI 25.67 kg/m   Vitals:   06/15/23 1047  BP: 104/72  Pulse: (!) 111  Height: 5\' 9"  (1.753 m)  Weight: 173 lb 12.8 oz (78.8 kg)  SpO2: 97%  BMI (Calculated): 25.65    Physical Exam Vitals and nursing note reviewed.  Constitutional:      Appearance: Normal appearance.  HENT:     Head: Normocephalic and atraumatic.     Nose: Nose normal.     Mouth/Throat:     Mouth: Mucous membranes are moist.     Pharynx: Oropharynx is clear.  Eyes:     Conjunctiva/sclera: Conjunctivae normal.     Pupils: Pupils are equal, round, and reactive to light.  Cardiovascular:     Rate and Rhythm: Normal rate and regular rhythm.     Pulses: Normal pulses.     Heart sounds: Normal heart sounds.  Pulmonary:      Effort: Pulmonary effort is normal.     Breath sounds: Normal breath sounds.  Abdominal:     General: Bowel sounds are normal.     Palpations: Abdomen is soft.  Musculoskeletal:        General: Normal range of motion.     Cervical back: Normal range of motion.  Skin:    General: Skin is warm and dry.  Neurological:     General: No focal deficit present.     Mental Status: He is alert and oriented to person, place, and time.  Psychiatric:        Mood and Affect: Mood normal.        Behavior: Behavior normal.        Judgment: Judgment normal.      Results for orders placed or performed in visit on 06/15/23  POCT CBG (Fasting - Glucose)  Result Value Ref Range   Glucose Fasting, POC 178 (A) 70 - 99 mg/dL    Recent Results (from the past 2160 hours)  Hemoglobin A1c     Status: Abnormal   Collection Time: 04/14/23 11:30 AM  Result Value Ref Range   Hgb A1c MFr Bld 7.6 (H) 4.8 - 5.6 %    Comment:          Prediabetes: 5.7 - 6.4          Diabetes: >6.4          Glycemic control for adults with diabetes: <7.0    Est. average glucose Bld gHb Est-mCnc 171 mg/dL  [id number]     Status: None   Collection Time: 04/14/23 11:30 AM  Result Value Ref Range   Glucose 72 70 - 99 mg/dL   BUN 13 6 - 24 mg/dL   Creatinine, Ser 4.09 0.76 - 1.27 mg/dL   eGFR  103 >59 mL/min/1.73   BUN/Creatinine Ratio 16 9 - 20   Sodium 139 134 - 144 mmol/L   Potassium 3.8 3.5 - 5.2 mmol/L   Chloride 99 96 - 106 mmol/L   CO2 21 20 - 29 mmol/L   Calcium  9.5 8.7 - 10.2 mg/dL   Total Protein 6.6 6.0 - 8.5 g/dL   Albumin 4.3 3.8 - 4.9 g/dL   Globulin, Total 2.3 1.5 - 4.5 g/dL   Bilirubin Total 0.4 0.0 - 1.2 mg/dL   Alkaline Phosphatase 76 44 - 121 IU/L   AST 16 0 - 40 IU/L   ALT 18 0 - 44 IU/L  Lipid Panel w/o Chol/HDL Ratio     Status: Abnormal   Collection Time: 04/14/23 11:30 AM  Result Value Ref Range   Cholesterol, Total 101 100 - 199 mg/dL   Triglycerides 161 0 - 149 mg/dL   HDL 25 (L) >09  mg/dL   VLDL Cholesterol Cal 19 5 - 40 mg/dL   LDL Chol Calc (NIH) 57 0 - 99 mg/dL  CBC with Diff     Status: Abnormal   Collection Time: 04/14/23 11:30 AM  Result Value Ref Range   WBC 13.8 (H) 3.4 - 10.8 x10E3/uL   RBC 5.27 4.14 - 5.80 x10E6/uL   Hemoglobin 15.4 13.0 - 17.7 g/dL   Hematocrit 60.4 54.0 - 51.0 %   MCV 87 79 - 97 fL   MCH 29.2 26.6 - 33.0 pg   MCHC 33.7 31.5 - 35.7 g/dL   RDW 98.1 19.1 - 47.8 %   Platelets 344 150 - 450 x10E3/uL   Neutrophils 78 Not Estab. %   Lymphs 16 Not Estab. %   Monocytes 5 Not Estab. %   Eos 1 Not Estab. %   Basos 0 Not Estab. %   Neutrophils Absolute 10.6 (H) 1.4 - 7.0 x10E3/uL   Lymphocytes Absolute 2.2 0.7 - 3.1 x10E3/uL   Monocytes Absolute 0.7 0.1 - 0.9 x10E3/uL   EOS (ABSOLUTE) 0.2 0.0 - 0.4 x10E3/uL   Basophils Absolute 0.1 0.0 - 0.2 x10E3/uL   Immature Granulocytes 0 Not Estab. %   Immature Grans (Abs) 0.0 0.0 - 0.1 x10E3/uL  POCT CBG (Fasting - Glucose)     Status: Abnormal   Collection Time: 04/17/23 10:06 AM  Result Value Ref Range   Glucose Fasting, POC 101 (A) 70 - 99 mg/dL  POCT CBG (Fasting - Glucose)     Status: Abnormal   Collection Time: 06/15/23 10:56 AM  Result Value Ref Range   Glucose Fasting, POC 178 (A) 70 - 99 mg/dL      Assessment & Plan:  Increase Ozempic  to 1 mg/week. Continue strict diet control and other meds. Problem List Items Addressed This Visit     Poorly controlled type 2 diabetes mellitus with peripheral neuropathy (HCC)   Relevant Medications   Semaglutide , 1 MG/DOSE, (OZEMPIC , 1 MG/DOSE,) 4 MG/3ML SOPN   GERD (gastroesophageal reflux disease)   Hypertension associated with diabetes (HCC) - Primary   Relevant Medications   Semaglutide , 1 MG/DOSE, (OZEMPIC , 1 MG/DOSE,) 4 MG/3ML SOPN   Combined hyperlipidemia associated with type 2 diabetes mellitus (HCC)   Relevant Medications   Semaglutide , 1 MG/DOSE, (OZEMPIC , 1 MG/DOSE,) 4 MG/3ML SOPN   Type 2 diabetes mellitus without complication,  without long-term current use of insulin  (HCC)   Relevant Medications   Semaglutide , 1 MG/DOSE, (OZEMPIC , 1 MG/DOSE,) 4 MG/3ML SOPN    Follow up 6 weeks.  Total time spent: 30  minutes  Aisha Hove, MD  06/15/2023   This document may have been prepared by [facility] Voice Recognition software and as such may include unintentional dictation errors.

## 2023-06-16 ENCOUNTER — Other Ambulatory Visit: Payer: Self-pay

## 2023-06-16 ENCOUNTER — Other Ambulatory Visit: Payer: Self-pay | Admitting: Internal Medicine

## 2023-06-16 ENCOUNTER — Ambulatory Visit: Admitting: Internal Medicine

## 2023-06-16 ENCOUNTER — Telehealth: Payer: Self-pay

## 2023-06-16 DIAGNOSIS — E1165 Type 2 diabetes mellitus with hyperglycemia: Secondary | ICD-10-CM

## 2023-06-16 NOTE — Telephone Encounter (Signed)
 Patient wife called stating that you had increased the dose of the insulin  to 50un but a new rx wasn't sent in, just want to make sure that you wanted this increased (basaglar ) from 45 to 50un as well as increasing the dose of the Ozempic  from 0.5mg  to 1mg 

## 2023-06-19 ENCOUNTER — Other Ambulatory Visit: Payer: Self-pay

## 2023-06-19 ENCOUNTER — Other Ambulatory Visit: Payer: Self-pay | Admitting: Internal Medicine

## 2023-06-19 DIAGNOSIS — E1159 Type 2 diabetes mellitus with other circulatory complications: Secondary | ICD-10-CM

## 2023-06-19 MED FILL — Insulin Glargine Soln Pen-Injector 100 Unit/ML: SUBCUTANEOUS | 66 days supply | Qty: 30 | Fill #0 | Status: CN

## 2023-06-19 NOTE — Telephone Encounter (Signed)
 Patient informed and rx refill request sent to PCP

## 2023-06-20 ENCOUNTER — Other Ambulatory Visit: Payer: Self-pay

## 2023-06-20 MED FILL — Insulin Glargine Soln Pen-Injector 100 Unit/ML: SUBCUTANEOUS | 66 days supply | Qty: 30 | Fill #0 | Status: CN

## 2023-06-21 ENCOUNTER — Other Ambulatory Visit: Payer: Self-pay

## 2023-06-21 MED FILL — Insulin Glargine Soln Pen-Injector 100 Unit/ML: SUBCUTANEOUS | 66 days supply | Qty: 30 | Fill #0 | Status: CN

## 2023-06-24 MED FILL — Clopidogrel Bisulfate Tab 75 MG (Base Equiv): ORAL | 90 days supply | Qty: 90 | Fill #3 | Status: AC

## 2023-06-25 ENCOUNTER — Other Ambulatory Visit: Payer: Self-pay

## 2023-06-26 ENCOUNTER — Other Ambulatory Visit: Payer: Self-pay

## 2023-06-28 ENCOUNTER — Other Ambulatory Visit: Payer: Self-pay

## 2023-06-29 ENCOUNTER — Other Ambulatory Visit: Payer: Self-pay

## 2023-06-29 MED FILL — Lancets: 25 days supply | Qty: 100 | Fill #9 | Status: AC

## 2023-07-04 ENCOUNTER — Other Ambulatory Visit: Payer: Self-pay

## 2023-07-04 DIAGNOSIS — R42 Dizziness and giddiness: Secondary | ICD-10-CM | POA: Insufficient documentation

## 2023-07-04 DIAGNOSIS — Z5321 Procedure and treatment not carried out due to patient leaving prior to being seen by health care provider: Secondary | ICD-10-CM | POA: Insufficient documentation

## 2023-07-04 DIAGNOSIS — R110 Nausea: Secondary | ICD-10-CM | POA: Insufficient documentation

## 2023-07-04 LAB — CBG MONITORING, ED: Glucose-Capillary: 163 mg/dL — ABNORMAL HIGH (ref 70–99)

## 2023-07-04 LAB — COMPREHENSIVE METABOLIC PANEL WITH GFR
ALT: 21 U/L (ref 0–44)
AST: 19 U/L (ref 15–41)
Albumin: 3.7 g/dL (ref 3.5–5.0)
Alkaline Phosphatase: 61 U/L (ref 38–126)
Anion gap: 11 (ref 5–15)
BUN: 10 mg/dL (ref 6–20)
CO2: 25 mmol/L (ref 22–32)
Calcium: 9.5 mg/dL (ref 8.9–10.3)
Chloride: 102 mmol/L (ref 98–111)
Creatinine, Ser: 0.83 mg/dL (ref 0.61–1.24)
GFR, Estimated: >60 mL/min (ref >60)
Glucose, Bld: 187 mg/dL — ABNORMAL HIGH (ref 70–99)
Potassium: 3.8 mmol/L (ref 3.5–5.1)
Sodium: 138 mmol/L (ref 135–145)
Total Bilirubin: 0.5 mg/dL (ref 0.0–1.2)
Total Protein: 6.9 g/dL (ref 6.5–8.1)

## 2023-07-04 LAB — CBC WITH DIFFERENTIAL/PLATELET
Abs Immature Granulocytes: 0.04 10*3/uL (ref 0.00–0.07)
Basophils Absolute: 0.1 10*3/uL (ref 0.0–0.1)
Basophils Relative: 1 %
Eosinophils Absolute: 0.2 10*3/uL (ref 0.0–0.5)
Eosinophils Relative: 2 %
HCT: 43.3 % (ref 39.0–52.0)
Hemoglobin: 14.2 g/dL (ref 13.0–17.0)
Immature Granulocytes: 0 %
Lymphocytes Relative: 26 %
Lymphs Abs: 2.5 10*3/uL (ref 0.7–4.0)
MCH: 28.5 pg (ref 26.0–34.0)
MCHC: 32.8 g/dL (ref 30.0–36.0)
MCV: 86.9 fL (ref 80.0–100.0)
Monocytes Absolute: 0.6 10*3/uL (ref 0.1–1.0)
Monocytes Relative: 6 %
Neutro Abs: 6.5 10*3/uL (ref 1.7–7.7)
Neutrophils Relative %: 65 %
Platelets: 276 10*3/uL (ref 150–400)
RBC: 4.98 MIL/uL (ref 4.22–5.81)
RDW: 13.4 % (ref 11.5–15.5)
WBC: 10 10*3/uL (ref 4.0–10.5)
nRBC: 0 % (ref 0.0–0.2)

## 2023-07-04 LAB — TROPONIN I (HIGH SENSITIVITY): Troponin I (High Sensitivity): 3 ng/L (ref <18)

## 2023-07-04 NOTE — ED Triage Notes (Signed)
 Pt reports onset of dizziness and nausea aprox 20 min ago. Pt denies chest pain or sob, pt reports he has not taken his insulin  in 1 month due to insurance reasons.

## 2023-07-04 NOTE — ED Notes (Signed)
 Per Dr Suzanne no additional orders at this time

## 2023-07-05 ENCOUNTER — Emergency Department
Admission: EM | Admit: 2023-07-05 | Discharge: 2023-07-05 | Discharge status: Against Medical Advice | Attending: Emergency Medicine | Admitting: Emergency Medicine

## 2023-07-05 NOTE — ED Notes (Signed)
 No answer from pt when called for repeat lab and vital signs.

## 2023-07-06 ENCOUNTER — Other Ambulatory Visit (HOSPITAL_BASED_OUTPATIENT_CLINIC_OR_DEPARTMENT_OTHER): Payer: Self-pay

## 2023-07-06 ENCOUNTER — Other Ambulatory Visit: Payer: Self-pay

## 2023-07-06 MED FILL — Insulin Glargine Soln Pen-Injector 100 Unit/ML: SUBCUTANEOUS | 66 days supply | Qty: 30 | Fill #0 | Status: CN

## 2023-07-10 ENCOUNTER — Other Ambulatory Visit: Payer: Self-pay

## 2023-07-10 MED FILL — Insulin Glargine Soln Pen-Injector 100 Unit/ML: SUBCUTANEOUS | 66 days supply | Qty: 30 | Fill #0 | Status: AC

## 2023-07-26 ENCOUNTER — Other Ambulatory Visit: Payer: Self-pay | Admitting: Internal Medicine

## 2023-07-26 DIAGNOSIS — I10 Essential (primary) hypertension: Secondary | ICD-10-CM

## 2023-07-27 ENCOUNTER — Other Ambulatory Visit: Payer: Self-pay

## 2023-07-27 ENCOUNTER — Ambulatory Visit: Admitting: Internal Medicine

## 2023-07-27 ENCOUNTER — Ambulatory Visit: Payer: Self-pay | Admitting: Internal Medicine

## 2023-07-27 ENCOUNTER — Other Ambulatory Visit: Payer: Self-pay | Admitting: Internal Medicine

## 2023-07-27 ENCOUNTER — Encounter: Payer: Self-pay | Admitting: Internal Medicine

## 2023-07-27 VITALS — BP 142/78 | HR 104 | Ht 69.0 in | Wt 170.6 lb

## 2023-07-27 DIAGNOSIS — I152 Hypertension secondary to endocrine disorders: Secondary | ICD-10-CM

## 2023-07-27 DIAGNOSIS — E782 Mixed hyperlipidemia: Secondary | ICD-10-CM | POA: Diagnosis not present

## 2023-07-27 DIAGNOSIS — E1165 Type 2 diabetes mellitus with hyperglycemia: Secondary | ICD-10-CM

## 2023-07-27 DIAGNOSIS — E1142 Type 2 diabetes mellitus with diabetic polyneuropathy: Secondary | ICD-10-CM

## 2023-07-27 DIAGNOSIS — E1159 Type 2 diabetes mellitus with other circulatory complications: Secondary | ICD-10-CM | POA: Diagnosis not present

## 2023-07-27 DIAGNOSIS — Z794 Long term (current) use of insulin: Secondary | ICD-10-CM | POA: Diagnosis not present

## 2023-07-27 DIAGNOSIS — I10 Essential (primary) hypertension: Secondary | ICD-10-CM

## 2023-07-27 DIAGNOSIS — E1169 Type 2 diabetes mellitus with other specified complication: Secondary | ICD-10-CM | POA: Diagnosis not present

## 2023-07-27 DIAGNOSIS — F17210 Nicotine dependence, cigarettes, uncomplicated: Secondary | ICD-10-CM

## 2023-07-27 DIAGNOSIS — Z125 Encounter for screening for malignant neoplasm of prostate: Secondary | ICD-10-CM | POA: Diagnosis not present

## 2023-07-27 LAB — POC CREATINE & ALBUMIN,URINE
Albumin/Creatinine Ratio, Urine, POC: <30
Creatinine, POC: 300 mg/dL
Microalbumin Ur, POC: 80 mg/L

## 2023-07-27 LAB — POCT CBG (FASTING - GLUCOSE)-MANUAL ENTRY: Glucose Fasting, POC: 157 mg/dL — AB (ref 70–99)

## 2023-07-27 MED ORDER — AMLODIPINE BESYLATE 5 MG PO TABS
5.0000 mg | ORAL_TABLET | Freq: Every day | ORAL | 11 refills | Status: DC
  Filled 2023-07-27: qty 30, 30d supply, fill #0
  Filled 2023-08-31: qty 30, 30d supply, fill #1
  Filled 2023-10-13 – 2023-10-24 (×2): qty 30, 30d supply, fill #2

## 2023-07-27 MED FILL — Lisinopril Tab 20 MG: ORAL | 90 days supply | Qty: 90 | Fill #0 | Status: AC

## 2023-07-27 NOTE — Progress Notes (Signed)
 Established Patient Office Visit  Subjective:  Patient ID: Henry Anderson, male    DOB: 11/03/1965  Age: 58 y.o. MRN: [id number]  Chief Complaint  Patient presents with   Follow-up    6 week follow up    Patient comes in for his follow-up today.  He is taking all his medications regularly.  He is also on Ozempic .  Lost 3 pounds.  His blood pressure is still high.  Will add amlodipine  5 mg/day.  Patient continues to smoke cigarettes but reports that he has cut back significantly.  He is due for labs today.  His last low-dose CT chest was in August 2024. Denies chest pain, no shortness of breath, no palpitations.  He had 1 episode of dizziness which felt like positional vertigo.  He went to the emergency room and his symptoms got better and he left without being evaluated.  He has not had any further episodes since that.    No other concerns at this time.   Past Medical History:  Diagnosis Date   Diabetes mellitus without complication (HCC)    Emphysema of lung (HCC)    Hypertension    Stroke [facility])     Past Surgical History:  Procedure Laterality Date   FOOT SURGERY Right    KNEE SURGERY Right    SHOULDER SURGERY Left     Social History   Socioeconomic History   Marital status: Married    Spouse name: Not on file   Number of children: Not on file   Years of education: Not on file   Highest education level: Not on file  Occupational History   Not on file  Tobacco Use   Smoking status: Every Day    Current packs/day: 1.50    Average packs/day: 1.5 packs/day for 30.0 years (45.0 ttl pk-yrs)    Types: Cigarettes   Smokeless tobacco: Former    Types: Chew   Tobacco comments:    Patient has cut down to ~1 ppd since having stroke. Gave [employer] Quit info. Patient down to 1/2 ppd  Vaping Use   Vaping status: Never Used  Substance and Sexual Activity   Alcohol use: Yes    Comment: occasionally 1-2 beers   Drug use: Never   Sexual activity: Not on file  Other Topics  Concern   Not on file  Social History Narrative   Not on file   Social Drivers of Health   Financial Resource Strain: High Risk (11/23/2021)   Overall Financial Resource Strain (CARDIA)    Difficulty of Paying Living Expenses: Very hard  Food Insecurity: Food Insecurity Present (10/27/2022)   Hunger Vital Sign    Worried About Running Out of Food in the Last Year: Often true    Ran Out of Food in the Last Year: Often true  Transportation Needs: No Transportation Needs (10/27/2022)   PRAPARE - Administrator, Civil Service (Medical): No    Lack of Transportation (Non-Medical): No  Physical Activity: Inactive (11/23/2021)   Exercise Vital Sign    Days of Exercise per Week: 0 days    Minutes of Exercise per Session: 0 min  Stress: Stress Concern Present (11/23/2021)   [employer] of Occupational Health - Occupational Stress Questionnaire    Feeling of Stress : Very much  Social Connections: Socially Isolated (11/23/2021)   Social Connection and Isolation Panel    Frequency of Communication with Friends and Family: Never    Frequency of Social Gatherings with Friends  and Family: Never    Attends Religious Services: Never    Active Member of Clubs or Organizations: No    Attends Banker Meetings: Never    Marital Status: Married  Catering manager Violence: Not At Risk (10/27/2022)   Humiliation, Afraid, Rape, and Kick questionnaire    Fear of Current or Ex-Partner: No    Emotionally Abused: No    Physically Abused: No    Sexually Abused: No    Family History  Problem Relation Age of Onset   COPD Mother    Diabetes Father    Diabetes Sister    Diabetes Sister    COPD Maternal Grandmother    Heart attack Maternal Grandfather    Diabetes Paternal Grandmother    Hyperlipidemia Paternal Grandmother    Hypertension Paternal Grandmother    Diabetes Paternal Grandfather    Hyperlipidemia Paternal Grandfather    Hypertension Paternal Grandfather      No Known Allergies  Outpatient Medications Prior to Visit  Medication Sig   acetaminophen  (TYLENOL ) 325 MG tablet Take 2 tablets (650 mg total) by mouth every 4 (four) hours as needed for mild pain (or temp > 37.5 C (99.5 F)).   albuterol  (PROAIR  HFA) 108 (90 Base) MCG/ACT inhaler Inhale 2 puffs into the lungs every 6 (six) hours as needed for wheezing or shortness of breath.   Aspirin  (VAZALORE ) 81 MG CAPS Take 1 tablet by mouth daily.   atorvastatin  (LIPITOR) 80 MG tablet Take 1 tablet (80 mg total) by mouth daily.   baclofen  (LIORESAL ) 10 MG tablet Take 1 tablet (10 mg total) by mouth daily.   chlorthalidone  (HYGROTON ) 25 MG tablet Take 1 tablet (25 mg total) by mouth once daily.   Cholecalciferol  (VITAMIN D3) 1.25 MG (50000 UT) CAPS Take 1 capsule (1.25 mg total) by mouth once a week.   clopidogrel  (PLAVIX ) 75 MG tablet Take 1 tablet (75 mg total) by mouth once daily.   gabapentin  (NEURONTIN ) 100 MG capsule Take 1 capsule (100 mg total) by mouth at bedtime.   glucose blood (ACCU-CHEK GUIDE TEST) test strip Use to check blood glucose up to 4 times a day   Insulin  Glargine (BASAGLAR  KWIKPEN) 100 UNIT/ML Inject 45 Units into the skin daily.   insulin  lispro (HUMALOG  KWIKPEN) 200 UNIT/ML KwikPen No insulin  if blood sugar less than 150 3 units if 151-200 6 units if 201-250 9 units if 251-300 12 units of 301-350   Insulin  Pen Needle (TRUEPLUS 5-BEVEL PEN NEEDLES) 32G X 4 MM MISC Inject 1 Needle with Basaglar  as directed daily.   Insulin  Pen Needle 31G X 6 MM MISC 1 each by Does not apply route 2 (two) times daily as needed.   lisinopril  (ZESTRIL ) 20 MG tablet Take 1 tablet (20 mg total) by mouth once daily.   metFORMIN  (GLUCOPHAGE -XR) 500 MG 24 hr tablet Take 2 tablets (1,000 mg total) by mouth daily with breakfast.   mometasone -formoterol  (DULERA ) 100-5 MCG/ACT AERO Inhale 2 puffs into the lungs 2 (two) times daily.   naproxen sodium (ALEVE) 220 MG tablet Take 220 mg by mouth daily as  needed.   pantoprazole  (PROTONIX ) 40 MG tablet Take 1 tablet (40 mg total) by mouth daily.   Rightest GL300 Lancets MISC Use up to four times daily as directed to check blood sugar.   Semaglutide , 1 MG/DOSE, (OZEMPIC , 1 MG/DOSE,) 4 MG/3ML SOPN Inject 1 mg into the skin once a week.   blood glucose meter kit and supplies KIT Use as directed (  Patient not taking: Reported on 07/27/2023)   dapagliflozin  propanediol (FARXIGA ) 10 MG TABS tablet Take 1 tablet (10 mg total) by mouth daily before breakfast. (Patient not taking: Reported on 07/27/2023)   No facility-administered medications prior to visit.    Review of Systems  Constitutional: Negative.  Negative for chills, fever and weight loss.  HENT: Negative.    Eyes: Negative.   Respiratory: Negative.  Negative for cough and shortness of breath.   Cardiovascular: Negative.  Negative for chest pain, palpitations and leg swelling.  Gastrointestinal: Negative.  Negative for abdominal pain, constipation, diarrhea, heartburn, nausea and vomiting.  Genitourinary: Negative.  Negative for dysuria and flank pain.  Musculoskeletal: Negative.  Negative for joint pain and myalgias.  Skin: Negative.   Neurological: Negative.  Negative for dizziness and headaches.  Endo/Heme/Allergies: Negative.   Psychiatric/Behavioral: Negative.  Negative for depression and suicidal ideas. The patient is not nervous/anxious.        Objective:   BP (!) 142/78   Pulse (!) 104   Ht 5' 9 (1.753 m)   Wt 170 lb 9.6 oz (77.4 kg)   SpO2 97%   BMI 25.19 kg/m   Vitals:   07/27/23 1130  BP: (!) 142/78  Pulse: (!) 104  Height: 5' 9 (1.753 m)  Weight: 170 lb 9.6 oz (77.4 kg)  SpO2: 97%  BMI (Calculated): 25.18    Physical Exam Vitals and nursing note reviewed.  Constitutional:      Appearance: Normal appearance.  HENT:     Head: Normocephalic and atraumatic.     Nose: Nose normal.     Mouth/Throat:     Mouth: Mucous membranes are moist.     Pharynx:  Oropharynx is clear.  Eyes:     Conjunctiva/sclera: Conjunctivae normal.     Pupils: Pupils are equal, round, and reactive to light.  Cardiovascular:     Rate and Rhythm: Normal rate and regular rhythm.     Pulses: Normal pulses.     Heart sounds: Normal heart sounds.  Pulmonary:     Effort: Pulmonary effort is normal.     Breath sounds: Normal breath sounds.  Abdominal:     General: Bowel sounds are normal.     Palpations: Abdomen is soft.  Musculoskeletal:        General: Normal range of motion.     Cervical back: Normal range of motion.  Skin:    General: Skin is warm and dry.  Neurological:     General: No focal deficit present.     Mental Status: He is alert and oriented to person, place, and time.  Psychiatric:        Mood and Affect: Mood normal.        Behavior: Behavior normal.        Judgment: Judgment normal.      Results for orders placed or performed in visit on 07/27/23  POCT CBG (Fasting - Glucose)  Result Value Ref Range   Glucose Fasting, POC 157 (A) 70 - 99 mg/dL  POC CREATINE & ALBUMIN,URINE  Result Value Ref Range   Microalbumin Ur, POC 80 mg/L   Creatinine, POC 300 mg/dL   Albumin/Creatinine Ratio, Urine, POC <30     Recent Results (from the past 2160 hours)  POCT CBG (Fasting - Glucose)     Status: Abnormal   Collection Time: 06/15/23 10:56 AM  Result Value Ref Range   Glucose Fasting, POC 178 (A) 70 - 99 mg/dL  CBC with Differential  Status: None   Collection Time: 07/04/23  9:09 PM  Result Value Ref Range   WBC 10.0 4.0 - 10.5 K/uL   RBC 4.98 4.22 - 5.81 MIL/uL   Hemoglobin 14.2 13.0 - 17.0 g/dL   HCT 56.6 60.9 - 47.9 %   MCV 86.9 80.0 - 100.0 fL   MCH 28.5 26.0 - 34.0 pg   MCHC 32.8 30.0 - 36.0 g/dL   RDW 86.5 88.4 - 84.4 %   Platelets 276 150 - 400 K/uL   nRBC 0.0 0.0 - 0.2 %   Neutrophils Relative % 65 %   Neutro Abs 6.5 1.7 - 7.7 K/uL   Lymphocytes Relative 26 %   Lymphs Abs 2.5 0.7 - 4.0 K/uL   Monocytes Relative 6 %    Monocytes Absolute 0.6 0.1 - 1.0 K/uL   Eosinophils Relative 2 %   Eosinophils Absolute 0.2 0.0 - 0.5 K/uL   Basophils Relative 1 %   Basophils Absolute 0.1 0.0 - 0.1 K/uL   Immature Granulocytes 0 %   Abs Immature Granulocytes 0.04 0.00 - 0.07 K/uL    Comment: Performed at [facility], [address] [place], [place] [address]  Comprehensive metabolic panel     Status: Abnormal   Collection Time: 07/04/23  9:09 PM  Result Value Ref Range   Sodium 138 135 - 145 mmol/L   Potassium 3.8 3.5 - 5.1 mmol/L   Chloride 102 98 - 111 mmol/L   CO2 25 22 - 32 mmol/L   Glucose, Bld 187 (H) 70 - 99 mg/dL    Comment: Glucose reference range applies only to samples taken after fasting for at least 8 hours.   BUN 10 6 - 20 mg/dL   Creatinine, Ser 9.16 0.61 - 1.24 mg/dL   Calcium  9.5 8.9 - 10.3 mg/dL   Total Protein 6.9 6.5 - 8.1 g/dL   Albumin 3.7 3.5 - 5.0 g/dL   AST 19 15 - 41 U/L   ALT 21 0 - 44 U/L   Alkaline Phosphatase 61 38 - 126 U/L   Total Bilirubin 0.5 0.0 - 1.2 mg/dL   GFR, Estimated >39 >39 mL/min    Comment: (NOTE) Calculated using the CKD-EPI Creatinine Equation (2021)    Anion gap 11 5 - 15    Comment: Performed at [facility], [address], [place], [place] [address]  Troponin I (High Sensitivity)     Status: None   Collection Time: 07/04/23  9:09 PM  Result Value Ref Range   Troponin I (High Sensitivity) 3 <18 ng/L    Comment: (NOTE) Elevated high sensitivity troponin I (hsTnI) values and significant  changes across serial measurements may suggest ACS but many other  chronic and acute conditions are known to elevate hsTnI results.  Refer to the Links section for chest pain algorithms and additional  guidance. Performed at [facility], [address] [place], [place] [address]   CBG monitoring, ED     Status: Abnormal   Collection Time: 07/04/23  9:11 PM  Result Value Ref Range   Glucose-Capillary 163 (H) 70 - 99 mg/dL    Comment:  Glucose reference range applies only to samples taken after fasting for at least 8 hours.  POCT CBG (Fasting - Glucose)     Status: Abnormal   Collection Time: 07/27/23 11:36 AM  Result Value Ref Range   Glucose Fasting, POC 157 (A) 70 - 99 mg/dL  POC CREATINE & ALBUMIN,URINE  Status: None   Collection Time: 07/27/23 12:05 PM  Result Value Ref Range   Microalbumin Ur, POC 80 mg/L   Creatinine, POC 300 mg/dL   Albumin/Creatinine Ratio, Urine, POC <30       Assessment & Plan:  Check blood work today.  Add Norvasc  5 mg daily.  Monitor blood pressure. Problem List Items Addressed This Visit     Poorly controlled type 2 diabetes mellitus with peripheral neuropathy (HCC)   Relevant Orders   Hemoglobin A1c   Cigarette nicotine  dependence without complication   Hypertension associated with diabetes (HCC) - Primary   Relevant Medications   amLODipine  (NORVASC ) 5 MG tablet   Other Relevant Orders   CMP14+EGFR   Combined hyperlipidemia associated with type 2 diabetes mellitus (HCC)   Relevant Medications   amLODipine  (NORVASC ) 5 MG tablet   Other Relevant Orders   Lipid Panel w/o Chol/HDL Ratio   Other Visit Diagnoses       Type 2 diabetes mellitus with hyperglycemia, with long-term current use of insulin  (HCC)       Relevant Orders   POCT CBG (Fasting - Glucose) (Completed)   POC CREATINE & ALBUMIN,URINE (Completed)     Screening for prostate cancer       Relevant Orders   PSA       Return in about 1 month (around 08/27/2023).   Total time spent: 30 minutes  FERNAND FREDY RAMAN, MD  07/27/2023   This document may have been prepared by [facility] Voice Recognition software and as such may include unintentional dictation errors.

## 2023-07-28 LAB — CMP14+EGFR
ALT: 23 IU/L (ref 0–44)
AST: 17 IU/L (ref 0–40)
Albumin: 4.4 g/dL (ref 3.8–4.9)
Alkaline Phosphatase: 87 IU/L (ref 44–121)
BUN/Creatinine Ratio: 10 (ref 9–20)
BUN: 9 mg/dL (ref 6–24)
Bilirubin Total: 0.4 mg/dL (ref 0.0–1.2)
CO2: 23 mmol/L (ref 20–29)
Calcium: 9.6 mg/dL (ref 8.7–10.2)
Chloride: 96 mmol/L (ref 96–106)
Creatinine, Ser: 0.91 mg/dL (ref 0.76–1.27)
Globulin, Total: 2.6 g/dL (ref 1.5–4.5)
Glucose: 180 mg/dL — ABNORMAL HIGH (ref 70–99)
Potassium: 3.8 mmol/L (ref 3.5–5.2)
Sodium: 140 mmol/L (ref 134–144)
Total Protein: 7 g/dL (ref 6.0–8.5)
eGFR: 99 mL/min/1.73 (ref >59)

## 2023-07-28 LAB — LIPID PANEL W/O CHOL/HDL RATIO
Cholesterol, Total: 136 mg/dL (ref 100–199)
HDL: 33 mg/dL — ABNORMAL LOW (ref >39)
LDL Chol Calc (NIH): 74 mg/dL (ref 0–99)
Triglycerides: 167 mg/dL — ABNORMAL HIGH (ref 0–149)
VLDL Cholesterol Cal: 29 mg/dL (ref 5–40)

## 2023-07-28 LAB — HEMOGLOBIN A1C
Est. average glucose Bld gHb Est-mCnc: 174 mg/dL
Hgb A1c MFr Bld: 7.7 % — ABNORMAL HIGH (ref 4.8–5.6)

## 2023-07-28 LAB — PSA: Prostate Specific Ag, Serum: 2.2 ng/mL (ref 0.0–4.0)

## 2023-08-07 ENCOUNTER — Other Ambulatory Visit: Payer: Self-pay | Admitting: Internal Medicine

## 2023-08-07 ENCOUNTER — Other Ambulatory Visit: Payer: Self-pay

## 2023-08-07 DIAGNOSIS — K219 Gastro-esophageal reflux disease without esophagitis: Secondary | ICD-10-CM

## 2023-08-07 DIAGNOSIS — E1142 Type 2 diabetes mellitus with diabetic polyneuropathy: Secondary | ICD-10-CM

## 2023-08-07 MED ORDER — PANTOPRAZOLE SODIUM 40 MG PO TBEC
40.0000 mg | DELAYED_RELEASE_TABLET | Freq: Every day | ORAL | 0 refills | Status: DC
  Filled 2023-08-07: qty 90, 90d supply, fill #0

## 2023-08-07 MED ORDER — HUMALOG KWIKPEN 200 UNIT/ML ~~LOC~~ SOPN
PEN_INJECTOR | SUBCUTANEOUS | 0 refills | Status: DC
Start: 2023-08-07 — End: 2023-09-04
  Filled 2023-08-07: qty 3, 25d supply, fill #0

## 2023-08-07 MED FILL — Atorvastatin Calcium Tab 80 MG (Base Equivalent): ORAL | 90 days supply | Qty: 90 | Fill #3 | Status: AC

## 2023-08-11 ENCOUNTER — Other Ambulatory Visit: Payer: Self-pay

## 2023-08-28 ENCOUNTER — Ambulatory Visit (INDEPENDENT_AMBULATORY_CARE_PROVIDER_SITE_OTHER): Admitting: Internal Medicine

## 2023-08-28 ENCOUNTER — Other Ambulatory Visit: Payer: Self-pay

## 2023-08-28 ENCOUNTER — Encounter: Payer: Self-pay | Admitting: Internal Medicine

## 2023-08-28 ENCOUNTER — Ambulatory Visit: Payer: Self-pay | Admitting: Internal Medicine

## 2023-08-28 VITALS — BP 118/80 | HR 105 | Ht 69.0 in | Wt 177.4 lb

## 2023-08-28 DIAGNOSIS — E1142 Type 2 diabetes mellitus with diabetic polyneuropathy: Secondary | ICD-10-CM | POA: Diagnosis not present

## 2023-08-28 DIAGNOSIS — I152 Hypertension secondary to endocrine disorders: Secondary | ICD-10-CM

## 2023-08-28 DIAGNOSIS — E1159 Type 2 diabetes mellitus with other circulatory complications: Secondary | ICD-10-CM

## 2023-08-28 DIAGNOSIS — E1165 Type 2 diabetes mellitus with hyperglycemia: Secondary | ICD-10-CM | POA: Diagnosis not present

## 2023-08-28 DIAGNOSIS — E782 Mixed hyperlipidemia: Secondary | ICD-10-CM | POA: Diagnosis not present

## 2023-08-28 DIAGNOSIS — E1169 Type 2 diabetes mellitus with other specified complication: Secondary | ICD-10-CM

## 2023-08-28 DIAGNOSIS — F17210 Nicotine dependence, cigarettes, uncomplicated: Secondary | ICD-10-CM | POA: Diagnosis not present

## 2023-08-28 DIAGNOSIS — K219 Gastro-esophageal reflux disease without esophagitis: Secondary | ICD-10-CM

## 2023-08-28 LAB — POCT CBG (FASTING - GLUCOSE)-MANUAL ENTRY: Glucose Fasting, POC: 209 mg/dL — AB (ref 70–99)

## 2023-08-28 MED ORDER — SEMAGLUTIDE (2 MG/DOSE) 8 MG/3ML ~~LOC~~ SOPN
2.0000 mg | PEN_INJECTOR | SUBCUTANEOUS | 0 refills | Status: DC
  Filled 2023-08-28: qty 3, 28d supply, fill #0

## 2023-08-28 NOTE — Progress Notes (Signed)
 Established Patient Office Visit  Subjective:  Patient ID: Henry Anderson, male    DOB: 02-14-1965  Age: 58 y.o. MRN: [id number]  Chief Complaint  Patient presents with   Follow-up    1 month follow up    Patient comes in for follow up. Using Ozempic  1 mg/week - says he still gets very hungry. No nausea/vomiting , no abdominal pain. Fingerstick glucose is 209. Will increase Ozempic  to 2mg /week. C/o his feet being very cold , continues to smoke as well. Will schedule Arterial Dopplers. Also due for low dose CT chest for screening-  Denies chest pain, no shortness of breath and no palpitations.    No other concerns at this time.   Past Medical History:  Diagnosis Date   Diabetes mellitus without complication (HCC)    Emphysema of lung (HCC)    Hypertension    Stroke [facility])     Past Surgical History:  Procedure Laterality Date   FOOT SURGERY Right    KNEE SURGERY Right    SHOULDER SURGERY Left     Social History   Socioeconomic History   Marital status: Married    Spouse name: Not on file   Number of children: Not on file   Years of education: Not on file   Highest education level: Not on file  Occupational History   Not on file  Tobacco Use   Smoking status: Every Day    Current packs/day: 1.50    Average packs/day: 1.5 packs/day for 30.0 years (45.0 ttl pk-yrs)    Types: Cigarettes   Smokeless tobacco: Former    Types: Chew   Tobacco comments:    Patient has cut down to ~1 ppd since having stroke. Gave  Quit info. Patient down to 1/2 ppd  Vaping Use   Vaping status: Never Used  Substance and Sexual Activity   Alcohol use: Yes    Comment: occasionally 1-2 beers   Drug use: Never   Sexual activity: Not on file  Other Topics Concern   Not on file  Social History Narrative   Not on file   Social Drivers of Health   Financial Resource Strain: High Risk (11/23/2021)   Overall Financial Resource Strain (CARDIA)    Difficulty of Paying Living  Expenses: Very hard  Food Insecurity: Food Insecurity Present (10/27/2022)   Hunger Vital Sign    Worried About Running Out of Food in the Last Year: Often true    Ran Out of Food in the Last Year: Often true  Transportation Needs: No Transportation Needs (10/27/2022)   PRAPARE - Administrator, Civil Service (Medical): No    Lack of Transportation (Non-Medical): No  Physical Activity: Inactive (11/23/2021)   Exercise Vital Sign    Days of Exercise per Week: 0 days    Minutes of Exercise per Session: 0 min  Stress: Stress Concern Present (11/23/2021)   [employer] of Occupational Health - Occupational Stress Questionnaire    Feeling of Stress : Very much  Social Connections: Socially Isolated (11/23/2021)   Social Connection and Isolation Panel    Frequency of Communication with Friends and Family: Never    Frequency of Social Gatherings with Friends and Family: Never    Attends Religious Services: Never    Database administrator or Organizations: No    Attends Banker Meetings: Never    Marital Status: Married  Catering manager Violence: Not At Risk (10/27/2022)   Humiliation, Afraid, Rape, and Kick  questionnaire    Fear of Current or Ex-Partner: No    Emotionally Abused: No    Physically Abused: No    Sexually Abused: No    Family History  Problem Relation Age of Onset   COPD Mother    Diabetes Father    Diabetes Sister    Diabetes Sister    COPD Maternal Grandmother    Heart attack Maternal Grandfather    Diabetes Paternal Grandmother    Hyperlipidemia Paternal Grandmother    Hypertension Paternal Grandmother    Diabetes Paternal Grandfather    Hyperlipidemia Paternal Grandfather    Hypertension Paternal Grandfather     No Known Allergies  Outpatient Medications Prior to Visit  Medication Sig   acetaminophen  (TYLENOL ) 325 MG tablet Take 2 tablets (650 mg total) by mouth every 4 (four) hours as needed for mild pain (or temp > 37.5 C  (99.5 F)).   albuterol  (PROAIR  HFA) 108 (90 Base) MCG/ACT inhaler Inhale 2 puffs into the lungs every 6 (six) hours as needed for wheezing or shortness of breath.   amLODipine  (NORVASC ) 5 MG tablet Take 1 tablet (5 mg total) by mouth daily.   Aspirin  (VAZALORE ) 81 MG CAPS Take 1 tablet by mouth daily.   atorvastatin  (LIPITOR) 80 MG tablet Take 1 tablet (80 mg total) by mouth daily.   baclofen  (LIORESAL ) 10 MG tablet Take 1 tablet (10 mg total) by mouth daily.   blood glucose meter kit and supplies KIT Use as directed   chlorthalidone  (HYGROTON ) 25 MG tablet Take 1 tablet (25 mg total) by mouth once daily.   Cholecalciferol  (VITAMIN D3) 1.25 MG (50000 UT) CAPS Take 1 capsule (1.25 mg total) by mouth once a week.   clopidogrel  (PLAVIX ) 75 MG tablet Take 1 tablet (75 mg total) by mouth once daily.   dapagliflozin  propanediol (FARXIGA ) 10 MG TABS tablet Take 1 tablet (10 mg total) by mouth daily before breakfast.   gabapentin  (NEURONTIN ) 100 MG capsule Take 1 capsule (100 mg total) by mouth at bedtime.   glucose blood (ACCU-CHEK GUIDE TEST) test strip Use to check blood glucose up to 4 times a day   Insulin  Glargine (BASAGLAR  KWIKPEN) 100 UNIT/ML Inject 45 Units into the skin daily.   insulin  lispro (HUMALOG  KWIKPEN) 200 UNIT/ML KwikPen No insulin  if blood sugar less than 150 3 units if 151-200 6 units if 201-250 9 units if 251-300 12 units of 301-350   Insulin  Pen Needle (TRUEPLUS 5-BEVEL PEN NEEDLES) 32G X 4 MM MISC Inject 1 Needle with Basaglar  as directed daily.   Insulin  Pen Needle 31G X 6 MM MISC 1 each by Does not apply route 2 (two) times daily as needed.   lisinopril  (ZESTRIL ) 20 MG tablet Take 1 tablet (20 mg total) by mouth once daily.   metFORMIN  (GLUCOPHAGE -XR) 500 MG 24 hr tablet Take 2 tablets (1,000 mg total) by mouth daily with breakfast.   mometasone -formoterol  (DULERA ) 100-5 MCG/ACT AERO Inhale 2 puffs into the lungs 2 (two) times daily.   naproxen sodium (ALEVE) 220 MG tablet  Take 220 mg by mouth daily as needed.   pantoprazole  (PROTONIX ) 40 MG tablet Take 1 tablet (40 mg total) by mouth daily.   Rightest GL300 Lancets MISC Use up to four times daily as directed to check blood sugar.   [DISCONTINUED] Semaglutide , 1 MG/DOSE, (OZEMPIC , 1 MG/DOSE,) 4 MG/3ML SOPN Inject 1 mg into the skin once a week.   No facility-administered medications prior to visit.    Review of  Systems  Constitutional: Negative.  Negative for chills, diaphoresis, fever, malaise/fatigue and weight loss.  HENT: Negative.  Negative for sore throat.   Eyes: Negative.   Respiratory: Negative.  Negative for cough and shortness of breath.   Cardiovascular: Negative.  Negative for chest pain, palpitations and leg swelling.  Gastrointestinal: Negative.  Negative for abdominal pain, blood in stool, constipation, diarrhea, heartburn, melena, nausea and vomiting.  Genitourinary: Negative.  Negative for dysuria and flank pain.  Musculoskeletal: Negative.  Negative for joint pain and myalgias.  Skin: Negative.   Neurological:  Positive for sensory change. Negative for dizziness and headaches.  Endo/Heme/Allergies: Negative.   Psychiatric/Behavioral: Negative.  Negative for depression and suicidal ideas. The patient is not nervous/anxious.        Objective:   BP 118/80   Pulse (!) 105   Ht 5' 9 (1.753 m)   Wt 177 lb 6.4 oz (80.5 kg)   SpO2 96%   BMI 26.20 kg/m   Vitals:   08/28/23 1054  BP: 118/80  Pulse: (!) 105  Height: 5' 9 (1.753 m)  Weight: 177 lb 6.4 oz (80.5 kg)  SpO2: 96%  BMI (Calculated): 26.19    Physical Exam Vitals and nursing note reviewed.  Constitutional:      Appearance: Normal appearance.  HENT:     Head: Normocephalic and atraumatic.     Nose: Nose normal.     Mouth/Throat:     Mouth: Mucous membranes are moist.     Pharynx: Oropharynx is clear.  Eyes:     Conjunctiva/sclera: Conjunctivae normal.     Pupils: Pupils are equal, round, and reactive to light.   Cardiovascular:     Rate and Rhythm: Normal rate and regular rhythm.     Pulses: Normal pulses.     Heart sounds: Normal heart sounds.  Pulmonary:     Effort: Pulmonary effort is normal.     Breath sounds: Normal breath sounds.  Abdominal:     General: Bowel sounds are normal. There is no distension.     Palpations: Abdomen is soft.     Tenderness: There is no right CVA tenderness, left CVA tenderness or guarding.  Musculoskeletal:        General: No swelling, tenderness, deformity or signs of injury. Normal range of motion.     Cervical back: Normal range of motion.     Right lower leg: No edema.     Left lower leg: No edema.  Skin:    General: Skin is warm and dry.     Findings: No lesion.  Neurological:     General: No focal deficit present.     Mental Status: He is alert and oriented to person, place, and time.  Psychiatric:        Mood and Affect: Mood normal.        Behavior: Behavior normal.        Judgment: Judgment normal.      Results for orders placed or performed in visit on 08/28/23  POCT CBG (Fasting - Glucose)  Result Value Ref Range   Glucose Fasting, POC 209 (A) 70 - 99 mg/dL    Recent Results (from the past 2160 hours)  POCT CBG (Fasting - Glucose)     Status: Abnormal   Collection Time: 06/15/23 10:56 AM  Result Value Ref Range   Glucose Fasting, POC 178 (A) 70 - 99 mg/dL  CBC with Differential     Status: None   Collection Time: 07/04/23  9:09  PM  Result Value Ref Range   WBC 10.0 4.0 - 10.5 K/uL   RBC 4.98 4.22 - 5.81 MIL/uL   Hemoglobin 14.2 13.0 - 17.0 g/dL   HCT 56.6 60.9 - 47.9 %   MCV 86.9 80.0 - 100.0 fL   MCH 28.5 26.0 - 34.0 pg   MCHC 32.8 30.0 - 36.0 g/dL   RDW 86.5 88.4 - 84.4 %   Platelets 276 150 - 400 K/uL   nRBC 0.0 0.0 - 0.2 %   Neutrophils Relative % 65 %   Neutro Abs 6.5 1.7 - 7.7 K/uL   Lymphocytes Relative 26 %   Lymphs Abs 2.5 0.7 - 4.0 K/uL   Monocytes Relative 6 %   Monocytes Absolute 0.6 0.1 - 1.0 K/uL    Eosinophils Relative 2 %   Eosinophils Absolute 0.2 0.0 - 0.5 K/uL   Basophils Relative 1 %   Basophils Absolute 0.1 0.0 - 0.1 K/uL   Immature Granulocytes 0 %   Abs Immature Granulocytes 0.04 0.00 - 0.07 K/uL    Comment: Performed at [facility], [address] [place], [place] [address]  Comprehensive metabolic panel     Status: Abnormal   Collection Time: 07/04/23  9:09 PM  Result Value Ref Range   Sodium 138 135 - 145 mmol/L   Potassium 3.8 3.5 - 5.1 mmol/L   Chloride 102 98 - 111 mmol/L   CO2 25 22 - 32 mmol/L   Glucose, Bld 187 (H) 70 - 99 mg/dL    Comment: Glucose reference range applies only to samples taken after fasting for at least 8 hours.   BUN 10 6 - 20 mg/dL   Creatinine, Ser 9.16 0.61 - 1.24 mg/dL   Calcium  9.5 8.9 - 10.3 mg/dL   Total Protein 6.9 6.5 - 8.1 g/dL   Albumin 3.7 3.5 - 5.0 g/dL   AST 19 15 - 41 U/L   ALT 21 0 - 44 U/L   Alkaline Phosphatase 61 38 - 126 U/L   Total Bilirubin 0.5 0.0 - 1.2 mg/dL   GFR, Estimated >39 >39 mL/min    Comment: (NOTE) Calculated using the CKD-EPI Creatinine Equation (2021)    Anion gap 11 5 - 15    Comment: Performed at [facility], [address] [place], [place] [address]  Troponin I (High Sensitivity)     Status: None   Collection Time: 07/04/23  9:09 PM  Result Value Ref Range   Troponin I (High Sensitivity) 3 <18 ng/L    Comment: (NOTE) Elevated high sensitivity troponin I (hsTnI) values and significant  changes across serial measurements may suggest ACS but many other  chronic and acute conditions are known to elevate hsTnI results.  Refer to the Links section for chest pain algorithms and additional  guidance. Performed at [facility], [address] [place], [place] [address]   CBG monitoring, ED     Status: Abnormal   Collection Time: 07/04/23  9:11 PM  Result Value Ref Range   Glucose-Capillary 163 (H) 70 - 99 mg/dL    Comment: Glucose reference range applies only to  samples taken after fasting for at least 8 hours.  POCT CBG (Fasting - Glucose)     Status: Abnormal   Collection Time: 07/27/23 11:36 AM  Result Value Ref Range   Glucose Fasting, POC 157 (A) 70 - 99 mg/dL  POC CREATINE & ALBUMIN,URINE     Status: None   Collection Time: 07/27/23 12:05 PM  Result Value Ref Range   Microalbumin Ur, POC 80 mg/L   Creatinine, POC 300 mg/dL   Albumin/Creatinine Ratio, Urine, POC <30   CMP14+EGFR     Status: Abnormal   Collection Time: 07/27/23 12:13 PM  Result Value Ref Range   Glucose 180 (H) 70 - 99 mg/dL   BUN 9 6 - 24 mg/dL   Creatinine, Ser 9.08 0.76 - 1.27 mg/dL   eGFR 99 >40 fO/fpw/8.26   BUN/Creatinine Ratio 10 9 - 20   Sodium 140 134 - 144 mmol/L   Potassium 3.8 3.5 - 5.2 mmol/L   Chloride 96 96 - 106 mmol/L   CO2 23 20 - 29 mmol/L   Calcium  9.6 8.7 - 10.2 mg/dL   Total Protein 7.0 6.0 - 8.5 g/dL   Albumin 4.4 3.8 - 4.9 g/dL   Globulin, Total 2.6 1.5 - 4.5 g/dL   Bilirubin Total 0.4 0.0 - 1.2 mg/dL   Alkaline Phosphatase 87 44 - 121 IU/L   AST 17 0 - 40 IU/L   ALT 23 0 - 44 IU/L  Lipid Panel w/o Chol/HDL Ratio     Status: Abnormal   Collection Time: 07/27/23 12:13 PM  Result Value Ref Range   Cholesterol, Total 136 100 - 199 mg/dL   Triglycerides 832 (H) 0 - 149 mg/dL   HDL 33 (L) >60 mg/dL   VLDL Cholesterol Cal 29 5 - 40 mg/dL   LDL Chol Calc (NIH) 74 0 - 99 mg/dL  Hemoglobin J8r     Status: Abnormal   Collection Time: 07/27/23 12:13 PM  Result Value Ref Range   Hgb A1c MFr Bld 7.7 (H) 4.8 - 5.6 %    Comment:          Prediabetes: 5.7 - 6.4          Diabetes: >6.4          Glycemic control for adults with diabetes: <7.0    Est. average glucose Bld gHb Est-mCnc 174 mg/dL  PSA     Status: None   Collection Time: 07/27/23 12:13 PM  Result Value Ref Range   Prostate Specific Ag, Serum 2.2 0.0 - 4.0 ng/mL    Comment: Roche ECLIA methodology. According to the American Urological Association, Serum PSA should decrease and remain  at undetectable levels after radical prostatectomy. The AUA defines biochemical recurrence as an initial PSA value 0.2 ng/mL or greater followed by a subsequent confirmatory PSA value 0.2 ng/mL or greater. Values obtained with different assay methods or kits cannot be used interchangeably. Results cannot be interpreted as absolute evidence of the presence or absence of malignant disease.   POCT CBG (Fasting - Glucose)     Status: Abnormal   Collection Time: 08/28/23 10:59 AM  Result Value Ref Range   Glucose Fasting, POC 209 (A) 70 - 99 mg/dL      Assessment & Plan:  Continue current meds. Schedule CT chest and arterial dopplers. Increase Ozempic  to 2 mg/week. Problem List Items Addressed This Visit     Poorly controlled type 2 diabetes mellitus with peripheral neuropathy (HCC)   Relevant Medications   Semaglutide , 2 MG/DOSE, 8 MG/3ML SOPN   Other Relevant Orders   POCT CBG (Fasting - Glucose) (Completed)   GERD (gastroesophageal reflux disease)   Cigarette nicotine  dependence without complication   Relevant Orders   CT CHEST LUNG CA SCREEN LOW DOSE W/O CM   Hypertension associated with diabetes (HCC) - Primary   Relevant Medications   Semaglutide ,  2 MG/DOSE, 8 MG/3ML SOPN   Combined hyperlipidemia associated with type 2 diabetes mellitus (HCC)   Relevant Medications   Semaglutide , 2 MG/DOSE, 8 MG/3ML SOPN   Other Visit Diagnoses       Poorly controlled type 2 diabetes mellitus with circulatory disorder (HCC)       Relevant Medications   Semaglutide , 2 MG/DOSE, 8 MG/3ML SOPN   Other Relevant Orders   VAS US  LOWER EXTREMITY ARTERIAL DUPLEX       Return in about 1 month (around 09/28/2023).   Total time spent: 30 minutes  FERNAND FREDY RAMAN, MD  08/28/2023   This document may have been prepared by [facility] Voice Recognition software and as such may include unintentional dictation errors.

## 2023-08-30 ENCOUNTER — Other Ambulatory Visit: Payer: Self-pay | Admitting: Internal Medicine

## 2023-08-30 DIAGNOSIS — E119 Type 2 diabetes mellitus without complications: Secondary | ICD-10-CM

## 2023-08-31 ENCOUNTER — Other Ambulatory Visit: Payer: Self-pay | Admitting: Internal Medicine

## 2023-08-31 ENCOUNTER — Other Ambulatory Visit: Payer: Self-pay

## 2023-08-31 DIAGNOSIS — E119 Type 2 diabetes mellitus without complications: Secondary | ICD-10-CM

## 2023-08-31 MED FILL — Metformin HCl Tab ER 24HR 500 MG: ORAL | 90 days supply | Qty: 180 | Fill #0 | Status: AC

## 2023-09-04 ENCOUNTER — Other Ambulatory Visit: Payer: Self-pay

## 2023-09-04 ENCOUNTER — Other Ambulatory Visit: Payer: Self-pay | Admitting: Cardiology

## 2023-09-04 DIAGNOSIS — E1142 Type 2 diabetes mellitus with diabetic polyneuropathy: Secondary | ICD-10-CM

## 2023-09-04 MED ORDER — HUMALOG KWIKPEN 200 UNIT/ML ~~LOC~~ SOPN
PEN_INJECTOR | SUBCUTANEOUS | 0 refills | Status: DC
  Filled 2023-09-04: qty 3, 28d supply, fill #0

## 2023-09-06 ENCOUNTER — Other Ambulatory Visit

## 2023-09-18 ENCOUNTER — Encounter: Payer: Self-pay | Admitting: Internal Medicine

## 2023-09-18 ENCOUNTER — Ambulatory Visit: Payer: Self-pay | Admitting: Internal Medicine

## 2023-09-18 ENCOUNTER — Other Ambulatory Visit: Payer: Self-pay

## 2023-09-18 ENCOUNTER — Ambulatory Visit (INDEPENDENT_AMBULATORY_CARE_PROVIDER_SITE_OTHER): Admitting: Internal Medicine

## 2023-09-18 VITALS — BP 80/58 | HR 123 | Ht 69.0 in | Wt 169.8 lb

## 2023-09-18 DIAGNOSIS — E1142 Type 2 diabetes mellitus with diabetic polyneuropathy: Secondary | ICD-10-CM

## 2023-09-18 DIAGNOSIS — E1165 Type 2 diabetes mellitus with hyperglycemia: Secondary | ICD-10-CM

## 2023-09-18 DIAGNOSIS — R110 Nausea: Secondary | ICD-10-CM

## 2023-09-18 DIAGNOSIS — E782 Mixed hyperlipidemia: Secondary | ICD-10-CM | POA: Diagnosis not present

## 2023-09-18 DIAGNOSIS — E1169 Type 2 diabetes mellitus with other specified complication: Secondary | ICD-10-CM

## 2023-09-18 DIAGNOSIS — K219 Gastro-esophageal reflux disease without esophagitis: Secondary | ICD-10-CM | POA: Diagnosis not present

## 2023-09-18 LAB — POCT CBG (FASTING - GLUCOSE)-MANUAL ENTRY: Glucose Fasting, POC: 270 mg/dL — AB (ref 70–99)

## 2023-09-18 MED ORDER — ONDANSETRON HCL 4 MG PO TABS
4.0000 mg | ORAL_TABLET | Freq: Three times a day (TID) | ORAL | 0 refills | Status: AC | PRN
  Filled 2023-09-18: qty 20, 7d supply, fill #0

## 2023-09-18 NOTE — Progress Notes (Deleted)
 Established Patient Office Visit  Subjective:  Patient ID: Henry Anderson, male    DOB: May 14, 1965  Age: 58 y.o. MRN: [id number]  Chief Complaint  Patient presents with   Nausea    After taking Ozempic     Ozempic  2 mg once weekly. Nauseous throwing up, drinking water after 1 injection Stop bp med until systolic above 120 Reduce back to 1 mg of Ozmepic restart on Sunday take this week off  Patient is here to follow up. Recently increased Ozempic  to 2 mg once weekly from 1 mg once weekly. Patient endorses constant nausea and throwing up. He states everything feels heavy in his stomach, he able to drink fluids.    No other concerns at this time.   Past Medical History:  Diagnosis Date   Diabetes mellitus without complication (HCC)    Emphysema of lung (HCC)    Hypertension    Stroke [facility])     Past Surgical History:  Procedure Laterality Date   FOOT SURGERY Right    KNEE SURGERY Right    SHOULDER SURGERY Left     Social History   Socioeconomic History   Marital status: Married    Spouse name: Not on file   Number of children: Not on file   Years of education: Not on file   Highest education level: Not on file  Occupational History   Not on file  Tobacco Use   Smoking status: Every Day    Current packs/day: 1.50    Average packs/day: 1.5 packs/day for 30.0 years (45.0 ttl pk-yrs)    Types: Cigarettes   Smokeless tobacco: Former    Types: Chew   Tobacco comments:    Patient has cut down to ~1 ppd since having stroke. Gave [place] Quit info. Patient down to 1/2 ppd  Vaping Use   Vaping status: Never Used  Substance and Sexual Activity   Alcohol use: Yes    Comment: occasionally 1-2 beers   Drug use: Never   Sexual activity: Not on file  Other Topics Concern   Not on file  Social History Narrative   Not on file   Social Drivers of Health   Financial Resource Strain: High Risk (11/23/2021)   Overall Financial Resource Strain (CARDIA)    Difficulty of Paying  Living Expenses: Very hard  Food Insecurity: Food Insecurity Present (10/27/2022)   Hunger Vital Sign    Worried About Running Out of Food in the Last Year: Often true    Ran Out of Food in the Last Year: Often true  Transportation Needs: No Transportation Needs (10/27/2022)   PRAPARE - Administrator, Civil Service (Medical): No    Lack of Transportation (Non-Medical): No  Physical Activity: Inactive (11/23/2021)   Exercise Vital Sign    Days of Exercise per Week: 0 days    Minutes of Exercise per Session: 0 min  Stress: Stress Concern Present (11/23/2021)   [employer] of Occupational Health - Occupational Stress Questionnaire    Feeling of Stress : Very much  Social Connections: Socially Isolated (11/23/2021)   Social Connection and Isolation Panel    Frequency of Communication with Friends and Family: Never    Frequency of Social Gatherings with Friends and Family: Never    Attends Religious Services: Never    Database administrator or Organizations: No    Attends Banker Meetings: Never    Marital Status: Married  Catering manager Violence: Not At Risk (10/27/2022)  Humiliation, Afraid, Rape, and Kick questionnaire    Fear of Current or Ex-Partner: No    Emotionally Abused: No    Physically Abused: No    Sexually Abused: No    Family History  Problem Relation Age of Onset   COPD Mother    Diabetes Father    Diabetes Sister    Diabetes Sister    COPD Maternal Grandmother    Heart attack Maternal Grandfather    Diabetes Paternal Grandmother    Hyperlipidemia Paternal Grandmother    Hypertension Paternal Grandmother    Diabetes Paternal Grandfather    Hyperlipidemia Paternal Grandfather    Hypertension Paternal Grandfather     No Known Allergies  Outpatient Medications Prior to Visit  Medication Sig   acetaminophen  (TYLENOL ) 325 MG tablet Take 2 tablets (650 mg total) by mouth every 4 (four) hours as needed for mild pain (or temp >  37.5 C (99.5 F)).   albuterol  (PROAIR  HFA) 108 (90 Base) MCG/ACT inhaler Inhale 2 puffs into the lungs every 6 (six) hours as needed for wheezing or shortness of breath.   amLODipine  (NORVASC ) 5 MG tablet Take 1 tablet (5 mg total) by mouth daily.   Aspirin  (VAZALORE ) 81 MG CAPS Take 1 tablet by mouth daily.   atorvastatin  (LIPITOR) 80 MG tablet Take 1 tablet (80 mg total) by mouth daily.   baclofen  (LIORESAL ) 10 MG tablet Take 1 tablet (10 mg total) by mouth daily.   blood glucose meter kit and supplies KIT Use as directed   chlorthalidone  (HYGROTON ) 25 MG tablet Take 1 tablet (25 mg total) by mouth once daily.   Cholecalciferol  (VITAMIN D3) 1.25 MG (50000 UT) CAPS Take 1 capsule (1.25 mg total) by mouth once a week.   clopidogrel  (PLAVIX ) 75 MG tablet Take 1 tablet (75 mg total) by mouth once daily.   dapagliflozin  propanediol (FARXIGA ) 10 MG TABS tablet Take 1 tablet (10 mg total) by mouth daily before breakfast.   gabapentin  (NEURONTIN ) 100 MG capsule Take 1 capsule (100 mg total) by mouth at bedtime.   glucose blood (ACCU-CHEK GUIDE TEST) test strip Use to check blood glucose up to 4 times a day   Insulin  Glargine (BASAGLAR  KWIKPEN) 100 UNIT/ML Inject 45 Units into the skin daily.   insulin  lispro (HUMALOG  KWIKPEN) 200 UNIT/ML KwikPen No insulin  if blood sugar less than 150 3 units if 151-200 6 units if 201-250 9 units if 251-300 12 units of 301-350   Insulin  Pen Needle (TRUEPLUS 5-BEVEL PEN NEEDLES) 32G X 4 MM MISC Inject 1 Needle with Basaglar  as directed daily.   Insulin  Pen Needle 31G X 6 MM MISC 1 each by Does not apply route 2 (two) times daily as needed.   lisinopril  (ZESTRIL ) 20 MG tablet Take 1 tablet (20 mg total) by mouth once daily.   metFORMIN  (GLUCOPHAGE -XR) 500 MG 24 hr tablet Take 2 tablets (1,000 mg total) by mouth daily with breakfast.   mometasone -formoterol  (DULERA ) 100-5 MCG/ACT AERO Inhale 2 puffs into the lungs 2 (two) times daily.   naproxen sodium (ALEVE) 220 MG  tablet Take 220 mg by mouth daily as needed.   pantoprazole  (PROTONIX ) 40 MG tablet Take 1 tablet (40 mg total) by mouth daily.   Rightest GL300 Lancets MISC Use up to four times daily as directed to check blood sugar.   Semaglutide , 2 MG/DOSE, 8 MG/3ML SOPN Inject 2 mg into the skin once a week.   No facility-administered medications prior to visit.    Review  of Systems  Constitutional: Negative.   HENT: Negative.    Eyes: Negative.   Respiratory: Negative.  Negative for cough and shortness of breath.   Cardiovascular: Negative.  Negative for chest pain, palpitations and leg swelling.  Gastrointestinal:  Positive for nausea and vomiting. Negative for abdominal pain, constipation, diarrhea and heartburn.  Genitourinary: Negative.  Negative for dysuria and flank pain.  Musculoskeletal: Negative.  Negative for joint pain and myalgias.  Skin: Negative.   Neurological: Negative.  Negative for dizziness and headaches.  Endo/Heme/Allergies: Negative.   Psychiatric/Behavioral: Negative.  Negative for depression and suicidal ideas. The patient is not nervous/anxious.        Objective:   BP (!) 80/58   Pulse (!) 123   Ht 5' 9 (1.753 m)   Wt 169 lb 12.8 oz (77 kg)   SpO2 99%   BMI 25.08 kg/m   Vitals:   09/18/23 1047  BP: (!) 80/58  Pulse: (!) 123  Height: 5' 9 (1.753 m)  Weight: 169 lb 12.8 oz (77 kg)  SpO2: 99%  BMI (Calculated): 25.06    Physical Exam Vitals and nursing note reviewed.  Constitutional:      Appearance: Normal appearance.  HENT:     Head: Normocephalic and atraumatic.     Nose: Nose normal.     Mouth/Throat:     Mouth: Mucous membranes are moist.     Pharynx: Oropharynx is clear.  Eyes:     Conjunctiva/sclera: Conjunctivae normal.     Pupils: Pupils are equal, round, and reactive to light.  Cardiovascular:     Rate and Rhythm: Normal rate and regular rhythm.     Pulses: Normal pulses.     Heart sounds: Normal heart sounds.  Pulmonary:      Effort: Pulmonary effort is normal.     Breath sounds: Normal breath sounds.  Abdominal:     General: Bowel sounds are normal.     Palpations: Abdomen is soft.  Musculoskeletal:        General: Normal range of motion.     Cervical back: Normal range of motion.  Skin:    General: Skin is warm and dry.  Neurological:     General: No focal deficit present.     Mental Status: He is alert and oriented to person, place, and time.  Psychiatric:        Mood and Affect: Mood normal.        Behavior: Behavior normal.        Judgment: Judgment normal.      Results for orders placed or performed in visit on 09/18/23  POCT CBG (Fasting - Glucose)  Result Value Ref Range   Glucose Fasting, POC 270 (A) 70 - 99 mg/dL    Recent Results (from the past 2160 hours)  CBC with Differential     Status: None   Collection Time: 07/04/23  9:09 PM  Result Value Ref Range   WBC 10.0 4.0 - 10.5 K/uL   RBC 4.98 4.22 - 5.81 MIL/uL   Hemoglobin 14.2 13.0 - 17.0 g/dL   HCT 56.6 60.9 - 47.9 %   MCV 86.9 80.0 - 100.0 fL   MCH 28.5 26.0 - 34.0 pg   MCHC 32.8 30.0 - 36.0 g/dL   RDW 86.5 88.4 - 84.4 %   Platelets 276 150 - 400 K/uL   nRBC 0.0 0.0 - 0.2 %   Neutrophils Relative % 65 %   Neutro Abs 6.5 1.7 - 7.7 K/uL  Lymphocytes Relative 26 %   Lymphs Abs 2.5 0.7 - 4.0 K/uL   Monocytes Relative 6 %   Monocytes Absolute 0.6 0.1 - 1.0 K/uL   Eosinophils Relative 2 %   Eosinophils Absolute 0.2 0.0 - 0.5 K/uL   Basophils Relative 1 %   Basophils Absolute 0.1 0.0 - 0.1 K/uL   Immature Granulocytes 0 %   Abs Immature Granulocytes 0.04 0.00 - 0.07 K/uL    Comment: Performed at [facility], [address] [place], [place] [address]  Comprehensive metabolic panel     Status: Abnormal   Collection Time: 07/04/23  9:09 PM  Result Value Ref Range   Sodium 138 135 - 145 mmol/L   Potassium 3.8 3.5 - 5.1 mmol/L   Chloride 102 98 - 111 mmol/L   CO2 25 22 - 32 mmol/L   Glucose, Bld 187 (H) 70 - 99  mg/dL    Comment: Glucose reference range applies only to samples taken after fasting for at least 8 hours.   BUN 10 6 - 20 mg/dL   Creatinine, Ser 9.16 0.61 - 1.24 mg/dL   Calcium  9.5 8.9 - 10.3 mg/dL   Total Protein 6.9 6.5 - 8.1 g/dL   Albumin 3.7 3.5 - 5.0 g/dL   AST 19 15 - 41 U/L   ALT 21 0 - 44 U/L   Alkaline Phosphatase 61 38 - 126 U/L   Total Bilirubin 0.5 0.0 - 1.2 mg/dL   GFR, Estimated >39 >39 mL/min    Comment: (NOTE) Calculated using the CKD-EPI Creatinine Equation (2021)    Anion gap 11 5 - 15    Comment: Performed at [facility], [address] [place], [place] [address]  Troponin I (High Sensitivity)     Status: None   Collection Time: 07/04/23  9:09 PM  Result Value Ref Range   Troponin I (High Sensitivity) 3 <18 ng/L    Comment: (NOTE) Elevated high sensitivity troponin I (hsTnI) values and significant  changes across serial measurements may suggest ACS but many other  chronic and acute conditions are known to elevate hsTnI results.  Refer to the Links section for chest pain algorithms and additional  guidance. Performed at [facility], [address] [place], [place] [address]   CBG monitoring, ED     Status: Abnormal   Collection Time: 07/04/23  9:11 PM  Result Value Ref Range   Glucose-Capillary 163 (H) 70 - 99 mg/dL    Comment: Glucose reference range applies only to samples taken after fasting for at least 8 hours.  POCT CBG (Fasting - Glucose)     Status: Abnormal   Collection Time: 07/27/23 11:36 AM  Result Value Ref Range   Glucose Fasting, POC 157 (A) 70 - 99 mg/dL  POC CREATINE & ALBUMIN,URINE     Status: None   Collection Time: 07/27/23 12:05 PM  Result Value Ref Range   Microalbumin Ur, POC 80 mg/L   Creatinine, POC 300 mg/dL   Albumin/Creatinine Ratio, Urine, POC <30   CMP14+EGFR     Status: Abnormal   Collection Time: 07/27/23 12:13 PM  Result Value Ref Range   Glucose 180 (H) 70 - 99 mg/dL   BUN 9 6 - 24  mg/dL   Creatinine, Ser 9.08 0.76 - 1.27 mg/dL   eGFR 99 >40 fO/fpw/8.26   BUN/Creatinine Ratio 10 9 - 20   Sodium 140 134 - 144 mmol/L   Potassium 3.8 3.5 - 5.2 mmol/L  Chloride 96 96 - 106 mmol/L   CO2 23 20 - 29 mmol/L   Calcium  9.6 8.7 - 10.2 mg/dL   Total Protein 7.0 6.0 - 8.5 g/dL   Albumin 4.4 3.8 - 4.9 g/dL   Globulin, Total 2.6 1.5 - 4.5 g/dL   Bilirubin Total 0.4 0.0 - 1.2 mg/dL   Alkaline Phosphatase 87 44 - 121 IU/L   AST 17 0 - 40 IU/L   ALT 23 0 - 44 IU/L  Lipid Panel w/o Chol/HDL Ratio     Status: Abnormal   Collection Time: 07/27/23 12:13 PM  Result Value Ref Range   Cholesterol, Total 136 100 - 199 mg/dL   Triglycerides 832 (H) 0 - 149 mg/dL   HDL 33 (L) >60 mg/dL   VLDL Cholesterol Cal 29 5 - 40 mg/dL   LDL Chol Calc (NIH) 74 0 - 99 mg/dL  Hemoglobin J8r     Status: Abnormal   Collection Time: 07/27/23 12:13 PM  Result Value Ref Range   Hgb A1c MFr Bld 7.7 (H) 4.8 - 5.6 %    Comment:          Prediabetes: 5.7 - 6.4          Diabetes: >6.4          Glycemic control for adults with diabetes: <7.0    Est. average glucose Bld gHb Est-mCnc 174 mg/dL  PSA     Status: None   Collection Time: 07/27/23 12:13 PM  Result Value Ref Range   Prostate Specific Ag, Serum 2.2 0.0 - 4.0 ng/mL    Comment: Roche ECLIA methodology. According to the American Urological Association, Serum PSA should decrease and remain at undetectable levels after radical prostatectomy. The AUA defines biochemical recurrence as an initial PSA value 0.2 ng/mL or greater followed by a subsequent confirmatory PSA value 0.2 ng/mL or greater. Values obtained with different assay methods or kits cannot be used interchangeably. Results cannot be interpreted as absolute evidence of the presence or absence of malignant disease.   POCT CBG (Fasting - Glucose)     Status: Abnormal   Collection Time: 08/28/23 10:59 AM  Result Value Ref Range   Glucose Fasting, POC 209 (A) 70 - 99 mg/dL  POCT CBG  (Fasting - Glucose)     Status: Abnormal   Collection Time: 09/18/23 11:10 AM  Result Value Ref Range   Glucose Fasting, POC 270 (A) 70 - 99 mg/dL      Assessment & Plan:   Problem List Items Addressed This Visit     Poorly controlled type 2 diabetes mellitus with peripheral neuropathy (HCC) - Primary   Relevant Orders   POCT CBG (Fasting - Glucose) (Completed)    No follow-ups on file.   Total time spent: {AMA time spent:29001} minutes  FERNAND FREDY RAMAN, MD  09/18/2023   This document may have been prepared by [facility] Voice Recognition software and as such may include unintentional dictation errors.

## 2023-09-18 NOTE — Progress Notes (Signed)
 Established Patient Office Visit  Subjective:  Patient ID: Henry Anderson, male    DOB: 02-14-1965  Age: 58 y.o. MRN: [id number]  Chief Complaint  Patient presents with   Nausea    After taking Ozempic     Patient comes in with reports of severe nausea after one dose on Ozempic  2 mg. Previously he was taking 1 mg and tolerating it. He has not been able to eat or drink and did lose weight. His BP id very low and pulse is fast. Denies dizziness , no chest pain or shortness of breath. Advised to stop BP meds for next few days- Zofran  rx sent- so he can increase fluid intake. Will reduce dose of Ozempic  to 1 mg/week.    No other concerns at this time.   Past Medical History:  Diagnosis Date   Diabetes mellitus without complication (HCC)    Emphysema of lung (HCC)    Hypertension    Stroke [facility])     Past Surgical History:  Procedure Laterality Date   FOOT SURGERY Right    KNEE SURGERY Right    SHOULDER SURGERY Left     Social History   Socioeconomic History   Marital status: Married    Spouse name: Not on file   Number of children: Not on file   Years of education: Not on file   Highest education level: Not on file  Occupational History   Not on file  Tobacco Use   Smoking status: Every Day    Current packs/day: 1.50    Average packs/day: 1.5 packs/day for 30.0 years (45.0 ttl pk-yrs)    Types: Cigarettes   Smokeless tobacco: Former    Types: Chew   Tobacco comments:    Patient has cut down to ~1 ppd since having stroke. Gave [place] Quit info. Patient down to 1/2 ppd  Vaping Use   Vaping status: Never Used  Substance and Sexual Activity   Alcohol use: Yes    Comment: occasionally 1-2 beers   Drug use: Never   Sexual activity: Not on file  Other Topics Concern   Not on file  Social History Narrative   Not on file   Social Drivers of Health   Financial Resource Strain: High Risk (11/23/2021)   Overall Financial Resource Strain (CARDIA)    Difficulty of  Paying Living Expenses: Very hard  Food Insecurity: Food Insecurity Present (10/27/2022)   Hunger Vital Sign    Worried About Running Out of Food in the Last Year: Often true    Ran Out of Food in the Last Year: Often true  Transportation Needs: No Transportation Needs (10/27/2022)   PRAPARE - Administrator, Civil Service (Medical): No    Lack of Transportation (Non-Medical): No  Physical Activity: Inactive (11/23/2021)   Exercise Vital Sign    Days of Exercise per Week: 0 days    Minutes of Exercise per Session: 0 min  Stress: Stress Concern Present (11/23/2021)   [employer] of Occupational Health - Occupational Stress Questionnaire    Feeling of Stress : Very much  Social Connections: Socially Isolated (11/23/2021)   Social Connection and Isolation Panel    Frequency of Communication with Friends and Family: Never    Frequency of Social Gatherings with Friends and Family: Never    Attends Religious Services: Never    Database administrator or Organizations: No    Attends Banker Meetings: Never    Marital Status: Married  Intimate  Partner Violence: Not At Risk (10/27/2022)   Humiliation, Afraid, Rape, and Kick questionnaire    Fear of Current or Ex-Partner: No    Emotionally Abused: No    Physically Abused: No    Sexually Abused: No    Family History  Problem Relation Age of Onset   COPD Mother    Diabetes Father    Diabetes Sister    Diabetes Sister    COPD Maternal Grandmother    Heart attack Maternal Grandfather    Diabetes Paternal Grandmother    Hyperlipidemia Paternal Grandmother    Hypertension Paternal Grandmother    Diabetes Paternal Grandfather    Hyperlipidemia Paternal Grandfather    Hypertension Paternal Grandfather     No Known Allergies  Outpatient Medications Prior to Visit  Medication Sig   acetaminophen  (TYLENOL ) 325 MG tablet Take 2 tablets (650 mg total) by mouth every 4 (four) hours as needed for mild pain (or  temp > 37.5 C (99.5 F)).   albuterol  (PROAIR  HFA) 108 (90 Base) MCG/ACT inhaler Inhale 2 puffs into the lungs every 6 (six) hours as needed for wheezing or shortness of breath.   amLODipine  (NORVASC ) 5 MG tablet Take 1 tablet (5 mg total) by mouth daily.   Aspirin  (VAZALORE ) 81 MG CAPS Take 1 tablet by mouth daily.   atorvastatin  (LIPITOR) 80 MG tablet Take 1 tablet (80 mg total) by mouth daily.   baclofen  (LIORESAL ) 10 MG tablet Take 1 tablet (10 mg total) by mouth daily.   blood glucose meter kit and supplies KIT Use as directed   chlorthalidone  (HYGROTON ) 25 MG tablet Take 1 tablet (25 mg total) by mouth once daily.   Cholecalciferol  (VITAMIN D3) 1.25 MG (50000 UT) CAPS Take 1 capsule (1.25 mg total) by mouth once a week.   clopidogrel  (PLAVIX ) 75 MG tablet Take 1 tablet (75 mg total) by mouth once daily.   dapagliflozin  propanediol (FARXIGA ) 10 MG TABS tablet Take 1 tablet (10 mg total) by mouth daily before breakfast.   gabapentin  (NEURONTIN ) 100 MG capsule Take 1 capsule (100 mg total) by mouth at bedtime.   glucose blood (ACCU-CHEK GUIDE TEST) test strip Use to check blood glucose up to 4 times a day   Insulin  Glargine (BASAGLAR  KWIKPEN) 100 UNIT/ML Inject 45 Units into the skin daily.   insulin  lispro (HUMALOG  KWIKPEN) 200 UNIT/ML KwikPen No insulin  if blood sugar less than 150 3 units if 151-200 6 units if 201-250 9 units if 251-300 12 units of 301-350   Insulin  Pen Needle (TRUEPLUS 5-BEVEL PEN NEEDLES) 32G X 4 MM MISC Inject 1 Needle with Basaglar  as directed daily.   Insulin  Pen Needle 31G X 6 MM MISC 1 each by Does not apply route 2 (two) times daily as needed.   lisinopril  (ZESTRIL ) 20 MG tablet Take 1 tablet (20 mg total) by mouth once daily.   metFORMIN  (GLUCOPHAGE -XR) 500 MG 24 hr tablet Take 2 tablets (1,000 mg total) by mouth daily with breakfast.   mometasone -formoterol  (DULERA ) 100-5 MCG/ACT AERO Inhale 2 puffs into the lungs 2 (two) times daily.   naproxen sodium (ALEVE)  220 MG tablet Take 220 mg by mouth daily as needed.   pantoprazole  (PROTONIX ) 40 MG tablet Take 1 tablet (40 mg total) by mouth daily.   Rightest GL300 Lancets MISC Use up to four times daily as directed to check blood sugar.   Semaglutide , 2 MG/DOSE, 8 MG/3ML SOPN Inject 2 mg into the skin once a week.   No facility-administered  medications prior to visit.    Review of Systems  Constitutional:  Positive for malaise/fatigue and weight loss. Negative for chills, diaphoresis and fever.  HENT: Negative.    Eyes: Negative.   Respiratory: Negative.  Negative for cough and shortness of breath.   Cardiovascular: Negative.  Negative for chest pain, palpitations and leg swelling.  Gastrointestinal: Negative.  Negative for abdominal pain, constipation, diarrhea, heartburn, nausea and vomiting.  Genitourinary: Negative.  Negative for dysuria and flank pain.  Musculoskeletal: Negative.  Negative for joint pain and myalgias.  Skin: Negative.   Neurological: Negative.  Negative for dizziness, tingling, tremors and headaches.  Endo/Heme/Allergies: Negative.   Psychiatric/Behavioral: Negative.  Negative for depression and suicidal ideas. The patient is not nervous/anxious.        Objective:   BP (!) 80/58   Pulse (!) 123   Ht 5' 9 (1.753 m)   Wt 169 lb 12.8 oz (77 kg)   SpO2 99%   BMI 25.08 kg/m   Vitals:   09/18/23 1047  BP: (!) 80/58  Pulse: (!) 123  Height: 5' 9 (1.753 m)  Weight: 169 lb 12.8 oz (77 kg)  SpO2: 99%  BMI (Calculated): 25.06    Physical Exam Vitals and nursing note reviewed.  Constitutional:      Appearance: Normal appearance.  HENT:     Head: Normocephalic and atraumatic.     Nose: Nose normal.     Mouth/Throat:     Mouth: Mucous membranes are moist.     Pharynx: Oropharynx is clear.  Eyes:     Conjunctiva/sclera: Conjunctivae normal.     Pupils: Pupils are equal, round, and reactive to light.  Cardiovascular:     Rate and Rhythm: Regular rhythm.  Tachycardia present.     Pulses: Normal pulses.     Heart sounds: Normal heart sounds.  Pulmonary:     Effort: Pulmonary effort is normal.     Breath sounds: Normal breath sounds.  Abdominal:     General: Bowel sounds are normal.     Palpations: Abdomen is soft.  Musculoskeletal:        General: Normal range of motion.     Cervical back: Normal range of motion.  Skin:    General: Skin is warm and dry.  Neurological:     General: No focal deficit present.     Mental Status: He is alert and oriented to person, place, and time.  Psychiatric:        Mood and Affect: Mood normal.        Behavior: Behavior normal.        Judgment: Judgment normal.      Results for orders placed or performed in visit on 09/18/23  POCT CBG (Fasting - Glucose)  Result Value Ref Range   Glucose Fasting, POC 270 (A) 70 - 99 mg/dL    Recent Results (from the past 2160 hours)  CBC with Differential     Status: None   Collection Time: 07/04/23  9:09 PM  Result Value Ref Range   WBC 10.0 4.0 - 10.5 K/uL   RBC 4.98 4.22 - 5.81 MIL/uL   Hemoglobin 14.2 13.0 - 17.0 g/dL   HCT 56.6 60.9 - 47.9 %   MCV 86.9 80.0 - 100.0 fL   MCH 28.5 26.0 - 34.0 pg   MCHC 32.8 30.0 - 36.0 g/dL   RDW 86.5 88.4 - 84.4 %   Platelets 276 150 - 400 K/uL   nRBC 0.0 0.0 - 0.2 %  Neutrophils Relative % 65 %   Neutro Abs 6.5 1.7 - 7.7 K/uL   Lymphocytes Relative 26 %   Lymphs Abs 2.5 0.7 - 4.0 K/uL   Monocytes Relative 6 %   Monocytes Absolute 0.6 0.1 - 1.0 K/uL   Eosinophils Relative 2 %   Eosinophils Absolute 0.2 0.0 - 0.5 K/uL   Basophils Relative 1 %   Basophils Absolute 0.1 0.0 - 0.1 K/uL   Immature Granulocytes 0 %   Abs Immature Granulocytes 0.04 0.00 - 0.07 K/uL    Comment: Performed at [facility], [address] [place], [place] [address]  Comprehensive metabolic panel     Status: Abnormal   Collection Time: 07/04/23  9:09 PM  Result Value Ref Range   Sodium 138 135 - 145 mmol/L   Potassium 3.8  3.5 - 5.1 mmol/L   Chloride 102 98 - 111 mmol/L   CO2 25 22 - 32 mmol/L   Glucose, Bld 187 (H) 70 - 99 mg/dL    Comment: Glucose reference range applies only to samples taken after fasting for at least 8 hours.   BUN 10 6 - 20 mg/dL   Creatinine, Ser 9.16 0.61 - 1.24 mg/dL   Calcium  9.5 8.9 - 10.3 mg/dL   Total Protein 6.9 6.5 - 8.1 g/dL   Albumin 3.7 3.5 - 5.0 g/dL   AST 19 15 - 41 U/L   ALT 21 0 - 44 U/L   Alkaline Phosphatase 61 38 - 126 U/L   Total Bilirubin 0.5 0.0 - 1.2 mg/dL   GFR, Estimated >39 >39 mL/min    Comment: (NOTE) Calculated using the CKD-EPI Creatinine Equation (2021)    Anion gap 11 5 - 15    Comment: Performed at [facility], [address], [place], [place] [address]  Troponin I (High Sensitivity)     Status: None   Collection Time: 07/04/23  9:09 PM  Result Value Ref Range   Troponin I (High Sensitivity) 3 <18 ng/L    Comment: (NOTE) Elevated high sensitivity troponin I (hsTnI) values and significant  changes across serial measurements may suggest ACS but many other  chronic and acute conditions are known to elevate hsTnI results.  Refer to the Links section for chest pain algorithms and additional  guidance. Performed at [facility], [address] [place], [place] [address]   CBG monitoring, ED     Status: Abnormal   Collection Time: 07/04/23  9:11 PM  Result Value Ref Range   Glucose-Capillary 163 (H) 70 - 99 mg/dL    Comment: Glucose reference range applies only to samples taken after fasting for at least 8 hours.  POCT CBG (Fasting - Glucose)     Status: Abnormal   Collection Time: 07/27/23 11:36 AM  Result Value Ref Range   Glucose Fasting, POC 157 (A) 70 - 99 mg/dL  POC CREATINE & ALBUMIN,URINE     Status: None   Collection Time: 07/27/23 12:05 PM  Result Value Ref Range   Microalbumin Ur, POC 80 mg/L   Creatinine, POC 300 mg/dL   Albumin/Creatinine Ratio, Urine, POC <30   CMP14+EGFR     Status: Abnormal    Collection Time: 07/27/23 12:13 PM  Result Value Ref Range   Glucose 180 (H) 70 - 99 mg/dL   BUN 9 6 - 24 mg/dL   Creatinine, Ser 9.08 0.76 - 1.27 mg/dL   eGFR 99 >40 fO/fpw/8.26   BUN/Creatinine Ratio 10 9 - 20  Sodium 140 134 - 144 mmol/L   Potassium 3.8 3.5 - 5.2 mmol/L   Chloride 96 96 - 106 mmol/L   CO2 23 20 - 29 mmol/L   Calcium  9.6 8.7 - 10.2 mg/dL   Total Protein 7.0 6.0 - 8.5 g/dL   Albumin 4.4 3.8 - 4.9 g/dL   Globulin, Total 2.6 1.5 - 4.5 g/dL   Bilirubin Total 0.4 0.0 - 1.2 mg/dL   Alkaline Phosphatase 87 44 - 121 IU/L   AST 17 0 - 40 IU/L   ALT 23 0 - 44 IU/L  Lipid Panel w/o Chol/HDL Ratio     Status: Abnormal   Collection Time: 07/27/23 12:13 PM  Result Value Ref Range   Cholesterol, Total 136 100 - 199 mg/dL   Triglycerides 832 (H) 0 - 149 mg/dL   HDL 33 (L) >60 mg/dL   VLDL Cholesterol Cal 29 5 - 40 mg/dL   LDL Chol Calc (NIH) 74 0 - 99 mg/dL  Hemoglobin J8r     Status: Abnormal   Collection Time: 07/27/23 12:13 PM  Result Value Ref Range   Hgb A1c MFr Bld 7.7 (H) 4.8 - 5.6 %    Comment:          Prediabetes: 5.7 - 6.4          Diabetes: >6.4          Glycemic control for adults with diabetes: <7.0    Est. average glucose Bld gHb Est-mCnc 174 mg/dL  PSA     Status: None   Collection Time: 07/27/23 12:13 PM  Result Value Ref Range   Prostate Specific Ag, Serum 2.2 0.0 - 4.0 ng/mL    Comment: Roche ECLIA methodology. According to the American Urological Association, Serum PSA should decrease and remain at undetectable levels after radical prostatectomy. The AUA defines biochemical recurrence as an initial PSA value 0.2 ng/mL or greater followed by a subsequent confirmatory PSA value 0.2 ng/mL or greater. Values obtained with different assay methods or kits cannot be used interchangeably. Results cannot be interpreted as absolute evidence of the presence or absence of malignant disease.   POCT CBG (Fasting - Glucose)     Status: Abnormal   Collection  Time: 08/28/23 10:59 AM  Result Value Ref Range   Glucose Fasting, POC 209 (A) 70 - 99 mg/dL  POCT CBG (Fasting - Glucose)     Status: Abnormal   Collection Time: 09/18/23 11:10 AM  Result Value Ref Range   Glucose Fasting, POC 270 (A) 70 - 99 mg/dL      Assessment & Plan:  Start Zofran  , and increase fluid intake. Hold off BP meds for next few days, monitor BP at home and resume once BP stabilizes and goes up again. Reduce dose of Ozempic  to 1 mg/week. Problem List Items Addressed This Visit     Poorly controlled type 2 diabetes mellitus with peripheral neuropathy (HCC) - Primary   Relevant Orders   POCT CBG (Fasting - Glucose) (Completed)   CBC with Diff   CMP14+EGFR   Nausea   Relevant Medications   ondansetron  (ZOFRAN ) 4 MG tablet   Other Relevant Orders   CBC with Diff   CMP14+EGFR   GERD (gastroesophageal reflux disease)   Relevant Medications   ondansetron  (ZOFRAN ) 4 MG tablet   Combined hyperlipidemia associated with type 2 diabetes mellitus (HCC)   Other Visit Diagnoses       Poorly controlled type 2 diabetes mellitus with circulatory disorder (HCC)  Return in about 1 month (around 10/18/2023).   Total time spent: 30 minutes  FERNAND FREDY RAMAN, MD  09/18/2023   This document may have been prepared by [facility] Voice Recognition software and as such may include unintentional dictation errors.

## 2023-09-28 ENCOUNTER — Other Ambulatory Visit: Payer: Self-pay | Admitting: Internal Medicine

## 2023-09-28 ENCOUNTER — Ambulatory Visit: Admitting: Internal Medicine

## 2023-09-28 ENCOUNTER — Other Ambulatory Visit: Payer: Self-pay

## 2023-09-28 DIAGNOSIS — M545 Low back pain, unspecified: Secondary | ICD-10-CM

## 2023-09-28 MED ORDER — BACLOFEN 10 MG PO TABS
10.0000 mg | ORAL_TABLET | Freq: Every day | ORAL | 1 refills | Status: AC
  Filled 2023-09-28: qty 30, 30d supply, fill #0
  Filled 2023-10-24: qty 30, 30d supply, fill #1

## 2023-09-28 MED FILL — Insulin Pen Needle 32 G X 4 MM (1/6" or 5/32"): 90 days supply | Qty: 100 | Fill #2 | Status: AC

## 2023-09-28 MED FILL — Insulin Glargine Soln Pen-Injector 100 Unit/ML: SUBCUTANEOUS | 66 days supply | Qty: 30 | Fill #1 | Status: AC

## 2023-10-05 ENCOUNTER — Ambulatory Visit
Admission: RE | Admit: 2023-10-05 | Discharge: 2023-10-05 | Disposition: A | Discharge status: Home / Self Care | Source: Ambulatory Visit | Attending: Internal Medicine | Admitting: Internal Medicine

## 2023-10-05 DIAGNOSIS — F17210 Nicotine dependence, cigarettes, uncomplicated: Secondary | ICD-10-CM | POA: Insufficient documentation

## 2023-10-19 ENCOUNTER — Ambulatory Visit: Admitting: Internal Medicine

## 2023-10-23 ENCOUNTER — Other Ambulatory Visit: Payer: Self-pay

## 2023-10-24 ENCOUNTER — Other Ambulatory Visit: Payer: Self-pay

## 2023-10-24 ENCOUNTER — Other Ambulatory Visit: Payer: Self-pay | Admitting: Internal Medicine

## 2023-10-24 DIAGNOSIS — E1142 Type 2 diabetes mellitus with diabetic polyneuropathy: Secondary | ICD-10-CM

## 2023-10-24 MED FILL — Lisinopril Tab 20 MG: ORAL | 90 days supply | Qty: 90 | Fill #1 | Status: AC

## 2023-10-25 ENCOUNTER — Other Ambulatory Visit (HOSPITAL_BASED_OUTPATIENT_CLINIC_OR_DEPARTMENT_OTHER): Payer: Self-pay

## 2023-10-25 ENCOUNTER — Other Ambulatory Visit (HOSPITAL_COMMUNITY): Payer: Self-pay

## 2023-10-25 ENCOUNTER — Other Ambulatory Visit: Payer: Self-pay

## 2023-10-25 MED ORDER — HUMALOG KWIKPEN 200 UNIT/ML ~~LOC~~ SOPN
PEN_INJECTOR | SUBCUTANEOUS | 0 refills | Status: DC
  Filled 2023-10-25: qty 3, 25d supply, fill #0
  Filled 2023-10-25: qty 3, 30d supply, fill #0
  Filled 2023-10-25 (×2): qty 3, 25d supply, fill #0

## 2023-10-26 ENCOUNTER — Ambulatory Visit: Admitting: Internal Medicine

## 2023-10-28 ENCOUNTER — Other Ambulatory Visit: Payer: Self-pay | Admitting: Internal Medicine

## 2023-10-28 DIAGNOSIS — E1165 Type 2 diabetes mellitus with hyperglycemia: Secondary | ICD-10-CM

## 2023-10-30 ENCOUNTER — Other Ambulatory Visit: Payer: Self-pay

## 2023-10-30 MED ORDER — OZEMPIC (2 MG/DOSE) 8 MG/3ML ~~LOC~~ SOPN
2.0000 mg | PEN_INJECTOR | SUBCUTANEOUS | 0 refills | Status: DC
Start: 2023-10-30 — End: 2023-11-13
  Filled 2023-10-30: qty 3, 28d supply, fill #0

## 2023-10-31 ENCOUNTER — Ambulatory Visit: Admitting: Internal Medicine

## 2023-11-08 ENCOUNTER — Encounter
Admission: EM | Disposition: A | Discharge status: Home / Self Care | Payer: Self-pay | Source: Home / Self Care | Attending: Internal Medicine

## 2023-11-08 ENCOUNTER — Other Ambulatory Visit: Payer: Self-pay

## 2023-11-08 ENCOUNTER — Inpatient Hospital Stay

## 2023-11-08 ENCOUNTER — Inpatient Hospital Stay
Admission: EM | Admit: 2023-11-08 | Discharge: 2023-11-11 | DRG: 322 | Disposition: A | Discharge status: Home / Self Care | Attending: Internal Medicine | Admitting: Internal Medicine

## 2023-11-08 DIAGNOSIS — Z7984 Long term (current) use of oral hypoglycemic drugs: Secondary | ICD-10-CM | POA: Diagnosis not present

## 2023-11-08 DIAGNOSIS — E872 Acidosis, unspecified: Secondary | ICD-10-CM | POA: Diagnosis present

## 2023-11-08 DIAGNOSIS — Z7902 Long term (current) use of antithrombotics/antiplatelets: Secondary | ICD-10-CM | POA: Diagnosis not present

## 2023-11-08 DIAGNOSIS — I959 Hypotension, unspecified: Secondary | ICD-10-CM | POA: Diagnosis present

## 2023-11-08 DIAGNOSIS — E1142 Type 2 diabetes mellitus with diabetic polyneuropathy: Secondary | ICD-10-CM | POA: Diagnosis present

## 2023-11-08 DIAGNOSIS — I213 ST elevation (STEMI) myocardial infarction of unspecified site: Secondary | ICD-10-CM | POA: Diagnosis not present

## 2023-11-08 DIAGNOSIS — F17210 Nicotine dependence, cigarettes, uncomplicated: Secondary | ICD-10-CM | POA: Diagnosis present

## 2023-11-08 DIAGNOSIS — E785 Hyperlipidemia, unspecified: Secondary | ICD-10-CM

## 2023-11-08 DIAGNOSIS — I252 Old myocardial infarction: Secondary | ICD-10-CM

## 2023-11-08 DIAGNOSIS — Z833 Family history of diabetes mellitus: Secondary | ICD-10-CM | POA: Diagnosis not present

## 2023-11-08 DIAGNOSIS — I2119 ST elevation (STEMI) myocardial infarction involving other coronary artery of inferior wall: Secondary | ICD-10-CM | POA: Diagnosis not present

## 2023-11-08 DIAGNOSIS — I639 Cerebral infarction, unspecified: Secondary | ICD-10-CM | POA: Diagnosis present

## 2023-11-08 DIAGNOSIS — Z716 Tobacco abuse counseling: Secondary | ICD-10-CM | POA: Diagnosis not present

## 2023-11-08 DIAGNOSIS — Z7951 Long term (current) use of inhaled steroids: Secondary | ICD-10-CM | POA: Diagnosis not present

## 2023-11-08 DIAGNOSIS — Z720 Tobacco use: Secondary | ICD-10-CM | POA: Diagnosis present

## 2023-11-08 DIAGNOSIS — I10 Essential (primary) hypertension: Secondary | ICD-10-CM | POA: Diagnosis not present

## 2023-11-08 DIAGNOSIS — E1165 Type 2 diabetes mellitus with hyperglycemia: Secondary | ICD-10-CM | POA: Diagnosis not present

## 2023-11-08 DIAGNOSIS — Z8249 Family history of ischemic heart disease and other diseases of the circulatory system: Secondary | ICD-10-CM | POA: Diagnosis not present

## 2023-11-08 DIAGNOSIS — Z0289 Encounter for other administrative examinations: Secondary | ICD-10-CM | POA: Diagnosis not present

## 2023-11-08 DIAGNOSIS — E663 Overweight: Secondary | ICD-10-CM | POA: Diagnosis not present

## 2023-11-08 DIAGNOSIS — Z7985 Long-term (current) use of injectable non-insulin antidiabetic drugs: Secondary | ICD-10-CM | POA: Diagnosis not present

## 2023-11-08 DIAGNOSIS — K219 Gastro-esophageal reflux disease without esophagitis: Secondary | ICD-10-CM | POA: Diagnosis present

## 2023-11-08 DIAGNOSIS — I2111 ST elevation (STEMI) myocardial infarction involving right coronary artery: Principal | ICD-10-CM | POA: Diagnosis present

## 2023-11-08 DIAGNOSIS — I2510 Atherosclerotic heart disease of native coronary artery without angina pectoris: Secondary | ICD-10-CM | POA: Diagnosis not present

## 2023-11-08 DIAGNOSIS — D72829 Elevated white blood cell count, unspecified: Secondary | ICD-10-CM | POA: Diagnosis present

## 2023-11-08 DIAGNOSIS — Z7982 Long term (current) use of aspirin: Secondary | ICD-10-CM

## 2023-11-08 DIAGNOSIS — Z6826 Body mass index (BMI) 26.0-26.9, adult: Secondary | ICD-10-CM

## 2023-11-08 DIAGNOSIS — Z8673 Personal history of transient ischemic attack (TIA), and cerebral infarction without residual deficits: Secondary | ICD-10-CM

## 2023-11-08 DIAGNOSIS — R531 Weakness: Secondary | ICD-10-CM | POA: Diagnosis present

## 2023-11-08 DIAGNOSIS — E782 Mixed hyperlipidemia: Secondary | ICD-10-CM | POA: Diagnosis present

## 2023-11-08 DIAGNOSIS — Z825 Family history of asthma and other chronic lower respiratory diseases: Secondary | ICD-10-CM | POA: Diagnosis not present

## 2023-11-08 DIAGNOSIS — J439 Emphysema, unspecified: Secondary | ICD-10-CM | POA: Diagnosis not present

## 2023-11-08 DIAGNOSIS — Z794 Long term (current) use of insulin: Secondary | ICD-10-CM | POA: Diagnosis not present

## 2023-11-08 DIAGNOSIS — Z79899 Other long term (current) drug therapy: Secondary | ICD-10-CM | POA: Diagnosis not present

## 2023-11-08 DIAGNOSIS — E119 Type 2 diabetes mellitus without complications: Secondary | ICD-10-CM | POA: Diagnosis not present

## 2023-11-08 DIAGNOSIS — R079 Chest pain, unspecified: Secondary | ICD-10-CM | POA: Diagnosis not present

## 2023-11-08 DIAGNOSIS — Z83438 Family history of other disorder of lipoprotein metabolism and other lipidemia: Secondary | ICD-10-CM

## 2023-11-08 LAB — CBC WITH DIFFERENTIAL/PLATELET
Abs Immature Granulocytes: 0.06 K/uL (ref 0.00–0.07)
Basophils Absolute: 0.1 K/uL (ref 0.0–0.1)
Basophils Relative: 1 %
Eosinophils Absolute: 0.1 K/uL (ref 0.0–0.5)
Eosinophils Relative: 1 %
HCT: 47.5 % (ref 39.0–52.0)
Hemoglobin: 16.2 g/dL (ref 13.0–17.0)
Immature Granulocytes: 0 %
Lymphocytes Relative: 25 %
Lymphs Abs: 3.8 K/uL (ref 0.7–4.0)
MCH: 29.1 pg (ref 26.0–34.0)
MCHC: 34.1 g/dL (ref 30.0–36.0)
MCV: 85.4 fL (ref 80.0–100.0)
Monocytes Absolute: 0.8 K/uL (ref 0.1–1.0)
Monocytes Relative: 6 %
Neutro Abs: 10.1 K/uL — ABNORMAL HIGH (ref 1.7–7.7)
Neutrophils Relative %: 67 %
Platelets: 306 K/uL (ref 150–400)
RBC: 5.56 MIL/uL (ref 4.22–5.81)
RDW: 12.4 % (ref 11.5–15.5)
WBC: 15 K/uL — ABNORMAL HIGH (ref 4.0–10.5)
nRBC: 0 % (ref 0.0–0.2)

## 2023-11-08 LAB — URINE DRUG SCREEN, QUALITATIVE (ARMC ONLY)
Amphetamines, Ur Screen: POSITIVE — AB
Barbiturates, Ur Screen: NOT DETECTED
Benzodiazepine, Ur Scrn: POSITIVE — AB
Cannabinoid 50 Ng, Ur ~~LOC~~: NOT DETECTED
Cocaine Metabolite,Ur ~~LOC~~: NOT DETECTED
MDMA (Ecstasy)Ur Screen: NOT DETECTED
Methadone Scn, Ur: NOT DETECTED
Opiate, Ur Screen: NOT DETECTED
Phencyclidine (PCP) Ur S: NOT DETECTED
Tricyclic, Ur Screen: NOT DETECTED

## 2023-11-08 LAB — COMPREHENSIVE METABOLIC PANEL WITH GFR
ALT: 26 U/L (ref 0–44)
AST: 25 U/L (ref 15–41)
Albumin: 4.2 g/dL (ref 3.5–5.0)
Alkaline Phosphatase: 89 U/L (ref 38–126)
Anion gap: 15 (ref 5–15)
BUN: 12 mg/dL (ref 6–20)
CO2: 24 mmol/L (ref 22–32)
Calcium: 9.2 mg/dL (ref 8.9–10.3)
Chloride: 99 mmol/L (ref 98–111)
Creatinine, Ser: 0.97 mg/dL (ref 0.61–1.24)
GFR, Estimated: >60 mL/min (ref >60)
Glucose, Bld: 432 mg/dL — ABNORMAL HIGH (ref 70–99)
Potassium: 4 mmol/L (ref 3.5–5.1)
Sodium: 138 mmol/L (ref 135–145)
Total Bilirubin: 0.8 mg/dL (ref 0.0–1.2)
Total Protein: 7.7 g/dL (ref 6.5–8.1)

## 2023-11-08 LAB — LIPID PANEL
Cholesterol: 217 mg/dL — ABNORMAL HIGH (ref 0–200)
HDL: 33 mg/dL — ABNORMAL LOW (ref >40)
LDL Cholesterol: UNDETERMINED mg/dL (ref 0–99)
Total CHOL/HDL Ratio: 6.6 ratio
Triglycerides: 650 mg/dL — ABNORMAL HIGH (ref <150)
VLDL: UNDETERMINED mg/dL (ref 0–40)

## 2023-11-08 LAB — GLUCOSE, CAPILLARY
Glucose-Capillary: 251 mg/dL — ABNORMAL HIGH (ref 70–99)
Glucose-Capillary: 382 mg/dL — ABNORMAL HIGH (ref 70–99)

## 2023-11-08 LAB — PROTIME-INR
INR: 0.9 (ref 0.8–1.2)
Prothrombin Time: 12.4 s (ref 11.4–15.2)

## 2023-11-08 LAB — HEMOGLOBIN A1C
Hgb A1c MFr Bld: 9.7 % — ABNORMAL HIGH (ref 4.8–5.6)
Mean Plasma Glucose: 231.69 mg/dL

## 2023-11-08 LAB — POCT ACTIVATED CLOTTING TIME: Activated Clotting Time: 475 s

## 2023-11-08 LAB — APTT: aPTT: 23 s — ABNORMAL LOW (ref 24–36)

## 2023-11-08 LAB — TROPONIN I (HIGH SENSITIVITY)
Troponin I (High Sensitivity): 2 ng/L (ref <18)
Troponin I (High Sensitivity): 18962 ng/L (ref <18)

## 2023-11-08 LAB — CG4 I-STAT (LACTIC ACID): Lactic Acid, Venous: 0.6 mmol/L (ref 0.5–1.9)

## 2023-11-08 SURGERY — CORONARY/GRAFT ACUTE MI REVASCULARIZATION
Anesthesia: Moderate Sedation

## 2023-11-08 MED ORDER — SODIUM CHLORIDE 0.9% FLUSH
3.0000 mL | Freq: Two times a day (BID) | INTRAVENOUS | Status: DC
  Administered 2023-11-08 – 2023-11-11 (×5): 3 mL via INTRAVENOUS

## 2023-11-08 MED ORDER — ONDANSETRON HCL 4 MG/2ML IJ SOLN: 4.0000 mg | Freq: Four times a day (QID) | INTRAMUSCULAR | Status: DC | PRN

## 2023-11-08 MED ORDER — HEPARIN SODIUM (PORCINE) 1000 UNIT/ML IJ SOLN
INTRAMUSCULAR | Status: AC
  Filled 2023-11-08: qty 10

## 2023-11-08 MED ORDER — SODIUM CHLORIDE 0.9 % IV SOLN: INTRAVENOUS | Status: AC

## 2023-11-08 MED ORDER — ATORVASTATIN CALCIUM 80 MG PO TABS: 80.0000 mg | ORAL_TABLET | Freq: Every day | ORAL | Status: DC

## 2023-11-08 MED ORDER — MIDAZOLAM HCL (PF) 2 MG/2ML IJ SOLN
INTRAMUSCULAR | Status: DC | PRN
  Administered 2023-11-08: 1 mg via INTRAVENOUS

## 2023-11-08 MED ORDER — ORAL CARE MOUTH RINSE
15.0000 mL | OROMUCOSAL | Status: DC | PRN
Start: 2023-11-08 — End: 2023-11-11

## 2023-11-08 MED ORDER — FLUTICASONE FUROATE-VILANTEROL 100-25 MCG/ACT IN AEPB
1.0000 | INHALATION_SPRAY | Freq: Every day | RESPIRATORY_TRACT | Status: DC
  Administered 2023-11-08 – 2023-11-11 (×3): 1 via RESPIRATORY_TRACT
  Filled 2023-11-08: qty 28

## 2023-11-08 MED ORDER — INSULIN ASPART 100 UNIT/ML IJ SOLN
0.0000 [IU] | Freq: Every day | INTRAMUSCULAR | Status: DC
  Administered 2023-11-08: 5 [IU] via SUBCUTANEOUS
  Filled 2023-11-08: qty 1

## 2023-11-08 MED ORDER — CLOPIDOGREL BISULFATE 75 MG PO TABS
75.0000 mg | ORAL_TABLET | Freq: Every day | ORAL | Status: DC
  Administered 2023-11-09 – 2023-11-11 (×3): 75 mg via ORAL
  Filled 2023-11-08 (×3): qty 1

## 2023-11-08 MED ORDER — PANTOPRAZOLE SODIUM 40 MG PO TBEC
40.0000 mg | DELAYED_RELEASE_TABLET | Freq: Every day | ORAL | Status: DC
Start: 2023-11-08 — End: 2023-11-11
  Administered 2023-11-08 – 2023-11-11 (×4): 40 mg via ORAL
  Filled 2023-11-08 (×4): qty 1

## 2023-11-08 MED ORDER — INSULIN GLARGINE-YFGN 100 UNIT/ML ~~LOC~~ SOLN
40.0000 [IU] | Freq: Every day | SUBCUTANEOUS | Status: DC
  Filled 2023-11-08: qty 0.4

## 2023-11-08 MED ORDER — HEPARIN (PORCINE) IN NACL 1000-0.9 UT/500ML-% IV SOLN
INTRAVENOUS | Status: AC
  Filled 2023-11-08: qty 1000

## 2023-11-08 MED ORDER — CLOPIDOGREL BISULFATE 75 MG PO TABS
300.0000 mg | ORAL_TABLET | Freq: Once | ORAL | Status: AC
  Administered 2023-11-08: 300 mg via ORAL

## 2023-11-08 MED ORDER — LABETALOL HCL 5 MG/ML IV SOLN: 10.0000 mg | INTRAVENOUS | Status: AC | PRN

## 2023-11-08 MED ORDER — FENTANYL CITRATE (PF) 100 MCG/2ML IJ SOLN
INTRAMUSCULAR | Status: DC | PRN
  Administered 2023-11-08: 25 ug via INTRAVENOUS

## 2023-11-08 MED ORDER — VERAPAMIL HCL 2.5 MG/ML IV SOLN
INTRAVENOUS | Status: DC | PRN
Start: 2023-11-08 — End: 2023-11-08
  Administered 2023-11-08: 2.5 mg via INTRA_ARTERIAL
  Administered 2023-11-08: 5 mg via INTRA_ARTERIAL
  Administered 2023-11-08: 2.5 mg via INTRA_ARTERIAL

## 2023-11-08 MED ORDER — TIROFIBAN HCL IN NACL 5-0.9 MG/100ML-% IV SOLN
0.1500 ug/kg/min | INTRAVENOUS | Status: AC
  Administered 2023-11-08 – 2023-11-09 (×2): 0.15 ug/kg/min via INTRAVENOUS
  Filled 2023-11-08 (×2): qty 100

## 2023-11-08 MED ORDER — HEPARIN SODIUM (PORCINE) 1000 UNIT/ML IJ SOLN
INTRAMUSCULAR | Status: DC | PRN
  Administered 2023-11-08: 4000 [IU] via INTRAVENOUS
  Administered 2023-11-08: 8000 [IU] via INTRAVENOUS

## 2023-11-08 MED ORDER — HYDRALAZINE HCL 20 MG/ML IJ SOLN: 5.0000 mg | INTRAMUSCULAR | Status: DC | PRN

## 2023-11-08 MED ORDER — INSULIN ASPART 100 UNIT/ML IJ SOLN: 0.0000 [IU] | Freq: Every day | INTRAMUSCULAR | Status: DC

## 2023-11-08 MED ORDER — ATROPINE SULFATE 1 MG/10ML IJ SOSY
PREFILLED_SYRINGE | INTRAMUSCULAR | Status: AC
  Filled 2023-11-08: qty 10

## 2023-11-08 MED ORDER — GABAPENTIN 100 MG PO CAPS
100.0000 mg | ORAL_CAPSULE | Freq: Every day | ORAL | Status: DC
  Administered 2023-11-08 – 2023-11-10 (×3): 100 mg via ORAL
  Filled 2023-11-08 (×3): qty 1

## 2023-11-08 MED ORDER — HYDRALAZINE HCL 20 MG/ML IJ SOLN: 10.0000 mg | INTRAMUSCULAR | Status: AC | PRN

## 2023-11-08 MED ORDER — HEPARIN SODIUM (PORCINE) 5000 UNIT/ML IJ SOLN: 4000.0000 [IU] | Freq: Once | INTRAMUSCULAR | Status: DC

## 2023-11-08 MED ORDER — VERAPAMIL HCL 2.5 MG/ML IV SOLN
INTRAVENOUS | Status: AC
  Filled 2023-11-08: qty 2

## 2023-11-08 MED ORDER — SODIUM CHLORIDE 0.9% FLUSH: 3.0000 mL | INTRAVENOUS | Status: DC | PRN

## 2023-11-08 MED ORDER — ALBUTEROL SULFATE (2.5 MG/3ML) 0.083% IN NEBU: 2.5000 mg | INHALATION_SOLUTION | RESPIRATORY_TRACT | Status: DC | PRN

## 2023-11-08 MED ORDER — IOHEXOL 300 MG/ML  SOLN
INTRAMUSCULAR | Status: DC | PRN
  Administered 2023-11-08: 179 mL

## 2023-11-08 MED ORDER — HEPARIN (PORCINE) IN NACL 1000-0.9 UT/500ML-% IV SOLN
INTRAVENOUS | Status: DC | PRN
Start: 2023-11-08 — End: 2023-11-08
  Administered 2023-11-08 (×2): 500 mL

## 2023-11-08 MED ORDER — ATORVASTATIN CALCIUM 80 MG PO TABS
80.0000 mg | ORAL_TABLET | Freq: Every day | ORAL | Status: DC
  Administered 2023-11-08 – 2023-11-11 (×4): 80 mg via ORAL
  Filled 2023-11-08 (×4): qty 1

## 2023-11-08 MED ORDER — FENTANYL CITRATE (PF) 100 MCG/2ML IJ SOLN
INTRAMUSCULAR | Status: AC
  Filled 2023-11-08: qty 2

## 2023-11-08 MED ORDER — TIROFIBAN (AGGRASTAT) BOLUS VIA INFUSION
INTRAVENOUS | Status: DC | PRN
Start: 2023-11-08 — End: 2023-11-08
  Administered 2023-11-08: 2040 ug via INTRAVENOUS

## 2023-11-08 MED ORDER — MIDAZOLAM HCL 2 MG/2ML IJ SOLN
INTRAMUSCULAR | Status: AC
  Filled 2023-11-08: qty 2

## 2023-11-08 MED ORDER — INSULIN ASPART 100 UNIT/ML IJ SOLN: 0.0000 [IU] | Freq: Three times a day (TID) | INTRAMUSCULAR | Status: DC

## 2023-11-08 MED ORDER — TIROFIBAN HCL IV 12.5 MG/250 ML
INTRAVENOUS | Status: AC
Start: 2023-11-08 — End: 2023-11-08
  Filled 2023-11-08: qty 250

## 2023-11-08 MED ORDER — INSULIN ASPART 100 UNIT/ML IJ SOLN
0.0000 [IU] | Freq: Three times a day (TID) | INTRAMUSCULAR | Status: DC
  Administered 2023-11-09: 3 [IU] via SUBCUTANEOUS
  Administered 2023-11-09: 5 [IU] via SUBCUTANEOUS
  Administered 2023-11-09 – 2023-11-11 (×3): 2 [IU] via SUBCUTANEOUS
  Filled 2023-11-08 (×5): qty 1

## 2023-11-08 MED ORDER — ACETAMINOPHEN 325 MG PO TABS
650.0000 mg | ORAL_TABLET | ORAL | Status: DC | PRN
  Administered 2023-11-09: 650 mg via ORAL
  Filled 2023-11-08: qty 2

## 2023-11-08 MED ORDER — NITROGLYCERIN 0.4 MG SL SUBL: 0.4000 mg | SUBLINGUAL_TABLET | SUBLINGUAL | Status: DC | PRN

## 2023-11-08 MED ORDER — LISINOPRIL 20 MG PO TABS
20.0000 mg | ORAL_TABLET | Freq: Every day | ORAL | Status: DC
  Administered 2023-11-08: 20 mg via ORAL
  Filled 2023-11-08: qty 1

## 2023-11-08 MED ORDER — NICOTINE 21 MG/24HR TD PT24
21.0000 mg | MEDICATED_PATCH | Freq: Every day | TRANSDERMAL | Status: DC
  Administered 2023-11-08 – 2023-11-11 (×4): 21 mg via TRANSDERMAL
  Filled 2023-11-08 (×4): qty 1

## 2023-11-08 MED ORDER — NITROGLYCERIN 1 MG/10 ML FOR IR/CATH LAB
INTRA_ARTERIAL | Status: DC | PRN
  Administered 2023-11-08: 200 ug via INTRACORONARY

## 2023-11-08 MED ORDER — MORPHINE SULFATE (PF) 2 MG/ML IV SOLN: 2.0000 mg | INTRAVENOUS | Status: DC | PRN

## 2023-11-08 MED ORDER — FREE WATER: 500.0000 mL | Freq: Once | Status: DC

## 2023-11-08 MED ORDER — HEPARIN SODIUM (PORCINE) 5000 UNIT/ML IJ SOLN: 60.0000 [IU]/kg | Freq: Once | INTRAMUSCULAR | Status: DC

## 2023-11-08 MED ORDER — ACETAMINOPHEN 325 MG PO TABS: 650.0000 mg | ORAL_TABLET | Freq: Four times a day (QID) | ORAL | Status: DC | PRN

## 2023-11-08 MED ORDER — LIDOCAINE HCL 1 % IJ SOLN
INTRAMUSCULAR | Status: AC
  Filled 2023-11-08: qty 20

## 2023-11-08 MED ORDER — OXYCODONE-ACETAMINOPHEN 5-325 MG PO TABS: 1.0000 | ORAL_TABLET | ORAL | Status: DC | PRN

## 2023-11-08 MED ORDER — AMLODIPINE BESYLATE 5 MG PO TABS
5.0000 mg | ORAL_TABLET | Freq: Every day | ORAL | Status: DC
  Administered 2023-11-08: 5 mg via ORAL
  Filled 2023-11-08: qty 1

## 2023-11-08 MED ORDER — TIROFIBAN HCL IV 12.5 MG/250 ML
INTRAVENOUS | Status: AC | PRN
  Administered 2023-11-08: .15 ug/kg/min via INTRAVENOUS

## 2023-11-08 MED ORDER — METOPROLOL SUCCINATE ER 50 MG PO TB24
25.0000 mg | ORAL_TABLET | Freq: Every day | ORAL | Status: DC
  Administered 2023-11-08: 25 mg via ORAL
  Filled 2023-11-08 (×2): qty 1

## 2023-11-08 MED ORDER — ASPIRIN 81 MG PO CHEW
81.0000 mg | CHEWABLE_TABLET | Freq: Every day | ORAL | Status: DC
  Administered 2023-11-09: 81 mg via ORAL
  Filled 2023-11-08: qty 1

## 2023-11-08 MED ORDER — SODIUM CHLORIDE 0.9 % IV SOLN: 250.0000 mL | INTRAVENOUS | Status: AC | PRN

## 2023-11-08 MED ORDER — LIDOCAINE HCL (PF) 1 % IJ SOLN
INTRAMUSCULAR | Status: DC | PRN
  Administered 2023-11-08: 2 mL

## 2023-11-08 MED ORDER — DM-GUAIFENESIN ER 30-600 MG PO TB12: 1.0000 | ORAL_TABLET | Freq: Two times a day (BID) | ORAL | Status: DC | PRN

## 2023-11-08 MED ORDER — ALBUTEROL SULFATE HFA 108 (90 BASE) MCG/ACT IN AERS: 2.0000 | INHALATION_SPRAY | RESPIRATORY_TRACT | Status: DC | PRN

## 2023-11-08 SURGICAL SUPPLY — 18 items
BALLOON MINITREK RX 2.0X12 (BALLOONS) IMPLANT
BALLOON TREK RX 2.5X20 (BALLOONS) IMPLANT
BALLOON ~~LOC~~ TREK NEO RX 3.25X20 (BALLOONS) IMPLANT
CATH INFINITI 5 FR JL3.5 (CATHETERS) IMPLANT
CATH INFINITI 5FR ANG PIGTAIL (CATHETERS) IMPLANT
CATH VISTA GUIDE 6FR JR3.5 MPK (CATHETERS) IMPLANT
DEVICE RAD TR BAND REGULAR (VASCULAR PRODUCTS) IMPLANT
DRAPE BRACHIAL (DRAPES) IMPLANT
GLIDESHEATH SLEND SS 6F .021 (SHEATH) IMPLANT
GUIDEWIRE INQWIRE 1.5J.035X260 (WIRE) IMPLANT
KIT ENCORE 26 ADVANTAGE (KITS) IMPLANT
PACK CARDIAC CATH (CUSTOM PROCEDURE TRAY) ×1 IMPLANT
SET ATX-X65L (MISCELLANEOUS) IMPLANT
STATION PROTECTION PRESSURIZED (MISCELLANEOUS) IMPLANT
STENT ONYX FRONTIER 3.0X30 (Permanent Stent) IMPLANT
STENT ONYX FRONTIER 3.5X18 (Permanent Stent) IMPLANT
TUBING CIL FLEX 10 FLL-RA (TUBING) IMPLANT
WIRE ASAHI PROWATER 180CM (WIRE) IMPLANT

## 2023-11-08 NOTE — Progress Notes (Signed)
 eLink Physician-Brief Progress Note Patient Name: Henry Anderson DOB: [birth year] MRN: [id number]   Date of Service  11/08/2023  HPI/Events of Note  Patient admitted with a Code STEMI and s/p cardiac catheterization with PCI + DES to RCA.  eICU Interventions  New Patient Evaluation.        [name] U [name] 11/08/2023, 8:56 PM

## 2023-11-08 NOTE — H&P (Signed)
 History and Physical    Henry Anderson FMW:[id number] DOB: Jun 05, 1965 DOA: 11/08/2023  Referring MD/NP/PA:   PCP: Fernand Fredy RAMAN, MD   Patient coming from:  The patient is coming from home.     Chief Complaint: chest pain  HPI: Henry Anderson is a 58 y.o. male with medical history significant of HTN, HLD, DM, COPD, stroke, GERD, neuropathy, tobacco abuse, who presents with chest pain.  Patient states that his chest pain started around 6:30 this afternoon, which is located in the substernal area, pressure-like, 10 out of 10 in severity, radiating to the left arm, not aggravated or alleviated by any known factors, associated with mild SOB and diaphoresis.  Patient has cough with little white mucus production, no fever or chills.  Denies nausea, vomiting, diarrhea or abdominal pain.  No symptoms of UTI.  Patient denies recent fall or head injury.  No rectal bleeding or dark stool. Pt took 325 mg of ASA at home.  Patient was found to have STEMI, Dr. Ammon of cardiology was consulted, who did urgent cardiac cath with 2 stents placement to RCA. Pt's chest pain has subsided after the procedure.  Cardiac cath by Dr. Ammon:   Prox RCA lesion is 100% stenosed.   Mid RCA lesion is 90% stenosed.   RPAV lesion is 100% stenosed.   A drug-eluting stent was successfully placed using a STENT ONYX FRONTIER 3.5X18.   A drug-eluting stent was successfully placed using a STENT ONYX FRONTIER 3.0X30.   using a BALLOON MINITREK RX 2.0X12.   Post intervention, there is a 0% residual stenosis.   Post intervention, there is a 0% residual stenosis.   Post intervention, there is a 100% residual stenosis.   The left ventricular systolic function is normal.   LV end diastolic pressure is normal.   The left ventricular ejection fraction is 50-55% by visual estimate.   1.  Inferior STEMI 2.  Occluded proximal RCA 3.  Overall normal left ventricular ejection fraction 4.  Successful primary PCI with  overlapping 3.5 x 18 mm and 3.0 x 30 mm Onyx frontier drug-eluting stents proximal and mid RCA   Recommendations   1.  Dual antiplatelet therapy uninterrupted x 1 year 2.  Start high intensity atorvastatin  3.  Start metoprolol succinate 25 mg daily 4.  2D echocardiogram   Data reviewed independently and ED Course: pt was found to have WBC 15.0, GFR> 60, lactic acid 0.2, troponin 2.  Temperature normal, blood pressure 131/102, heart rate 89, RR 23 --> 17, oxygen sat 98% on room air currently.  Patient is admitted to ICU by Dr. Ammon as inpatient.   EKG: I have personally reviewed.  Sinus rhythm, QTc 425, ST elevation in inferior leads and V3-V6, ST depression in inferior lead I/aVL and V1-V2.   Review of Systems:   General: no fevers, chills, no body weight gain, has fatigue HEENT: no blurry vision, hearing changes or sore throat Respiratory: has dyspnea, coughing, no wheezing CV: has chest pain, no palpitations GI: no nausea, vomiting, abdominal pain, diarrhea, constipation GU: no dysuria, burning on urination, increased urinary frequency, hematuria  Ext: no leg edema Neuro: no unilateral weakness, numbness, or tingling, no vision change or hearing loss Skin: no rash, no skin tear. MSK: No muscle spasm, no deformity, no limitation of range of movement in spin Heme: No easy bruising.  Travel history: No recent long distant travel.   Allergy: No Known Allergies  Past Medical History:  Diagnosis Date  Diabetes mellitus without complication (HCC)    Emphysema of lung (HCC)    Hypertension    Stroke [facility])     Past Surgical History:  Procedure Laterality Date   FOOT SURGERY Right    KNEE SURGERY Right    SHOULDER SURGERY Left     Social History:  reports that he has been smoking cigarettes. He has a 45 pack-year smoking history. He has quit using smokeless tobacco.  His smokeless tobacco use included chew. He reports current alcohol use. He reports that he does not use  drugs.  Family History:  Family History  Problem Relation Age of Onset   COPD Mother    Diabetes Father    Diabetes Sister    Diabetes Sister    COPD Maternal Grandmother    Heart attack Maternal Grandfather    Diabetes Paternal Grandmother    Hyperlipidemia Paternal Grandmother    Hypertension Paternal Grandmother    Diabetes Paternal Grandfather    Hyperlipidemia Paternal Grandfather    Hypertension Paternal Grandfather      Prior to Admission medications   Medication Sig Start Date End Date Taking? Authorizing Provider  acetaminophen  (TYLENOL ) 325 MG tablet Take 2 tablets (650 mg total) by mouth every 4 (four) hours as needed for mild pain (or temp > 37.5 C (99.5 F)). 10/14/20   Swayze, Ava, DO  albuterol  (PROAIR  HFA) 108 (90 Base) MCG/ACT inhaler Inhale 2 puffs into the lungs every 6 (six) hours as needed for wheezing or shortness of breath. 11/11/22   Orlean Alan HERO, FNP  amLODipine  (NORVASC ) 5 MG tablet Take 1 tablet (5 mg total) by mouth daily. 07/27/23 07/26/24  Fernand Fredy RAMAN, MD  Aspirin  (VAZALORE ) 81 MG CAPS Take 1 tablet by mouth daily.    [provider]  atorvastatin  (LIPITOR) 80 MG tablet Take 1 tablet (80 mg total) by mouth daily. 10/10/22   Fernand Fredy RAMAN, MD  baclofen  (LIORESAL ) 10 MG tablet Take 1 tablet (10 mg total) by mouth daily. 09/28/23 09/27/24  Fernand Fredy RAMAN, MD  blood glucose meter kit and supplies KIT Use as directed 07/11/22   Fernand Fredy RAMAN, MD  chlorthalidone  (HYGROTON ) 25 MG tablet Take 1 tablet (25 mg total) by mouth once daily. 06/06/23   Fernand Fredy RAMAN, MD  Cholecalciferol  (VITAMIN D3) 1.25 MG (50000 UT) CAPS Take 1 capsule (1.25 mg total) by mouth once a week. 06/24/22   Fernand Fredy RAMAN, MD  clopidogrel  (PLAVIX ) 75 MG tablet Take 1 tablet (75 mg total) by mouth once daily. 09/08/22   Fernand Fredy RAMAN, MD  dapagliflozin  propanediol (FARXIGA ) 10 MG TABS tablet Take 1 tablet (10 mg total) by mouth daily before breakfast. 03/03/23   Scoggins, Amber, NP   gabapentin  (NEURONTIN ) 100 MG capsule Take 1 capsule (100 mg total) by mouth at bedtime. 05/01/23 04/30/24  Fernand Fredy RAMAN, MD  glucose blood (ACCU-CHEK GUIDE TEST) test strip Use to check blood glucose up to 4 times a day 06/05/23   Fernand Fredy RAMAN, MD  Insulin  Glargine (BASAGLAR  [facility]) 100 UNIT/ML Inject 45 Units into the skin daily. 06/19/23   Fernand Fredy RAMAN, MD  insulin  lispro (HUMALOG  KWIKPEN) 200 UNIT/ML KwikPen No insulin  if blood sugar less than 150, Inject 3 units if 151-200, inject 6 units if 201-250, inject 9 units if 251-300, inject 12 units if 301-350. Sliding scale with 12 units/day max 10/25/23   Fernand Fredy RAMAN, MD  Insulin  Pen Needle (TRUEPLUS 5-BEVEL PEN NEEDLES) 32G X 4 MM MISC  Inject 1 Needle with Basaglar  as directed daily. 03/03/23   Scoggins, Amber, NP  Insulin  Pen Needle 31G X 6 MM MISC 1 each by Does not apply route 2 (two) times daily as needed. 05/12/23   Fernand Fredy RAMAN, MD  lisinopril  (ZESTRIL ) 20 MG tablet Take 1 tablet (20 mg total) by mouth once daily. 07/27/23   Fernand Fredy RAMAN, MD  metFORMIN  (GLUCOPHAGE -XR) 500 MG 24 hr tablet Take 2 tablets (1,000 mg total) by mouth daily with breakfast. 08/31/23   Fernand Fredy RAMAN, MD  mometasone -formoterol  (DULERA ) 100-5 MCG/ACT AERO Inhale 2 puffs into the lungs 2 (two) times daily. 12/01/20   Iloabachie, Chioma E, NP  naproxen sodium (ALEVE) 220 MG tablet Take 220 mg by mouth daily as needed.    [provider]  ondansetron  (ZOFRAN ) 4 MG tablet Take 1 tablet (4 mg total) by mouth every 8 (eight) hours as needed for nausea or vomiting. 09/18/23   Fernand Fredy RAMAN, MD  pantoprazole  (PROTONIX ) 40 MG tablet Take 1 tablet (40 mg total) by mouth daily. 08/07/23   Scoggins, Amber, NP  Rightest GL300 Lancets MISC Use up to four times daily as directed to check blood sugar. 07/11/22   Fernand Fredy RAMAN, MD  Semaglutide , 2 MG/DOSE, (OZEMPIC , 2 MG/DOSE,) 8 MG/3ML SOPN Inject 2 mg into the skin once a week. 10/30/23   Fernand Fredy RAMAN, MD    Physical  Exam: Vitals:   11/08/23 1854 11/08/23 1906 11/08/23 2030 11/08/23 2045  BP: (!) 131/102  133/78 (!) 151/92  Pulse: 89  88 89  Resp: 17  (!) 22 19  Temp: 97.8 F (36.6 C)  (!) 97.5 F (36.4 C)   TempSrc: Oral  Oral   SpO2: 98% 98% 96%   Weight:       General: Not in acute distress HEENT:       Eyes: PERRL, EOMI, no jaundice       ENT: No discharge from the ears and nose, no pharynx injection, no tonsillar enlargement.        Neck: No JVD, no bruit, no mass felt. Heme: No neck lymph node enlargement. Cardiac: S1/S2, RRR, No murmurs, No gallops or rubs. Respiratory: No rales, wheezing, rhonchi or rubs. GI: Soft, nondistended, nontender, no rebound pain, no organomegaly, BS present. GU: No hematuria Ext: No pitting leg edema bilaterally. 1+DP/PT pulse bilaterally. Musculoskeletal: No joint deformities, No joint redness or warmth, no limitation of ROM in spin. Skin: No rashes.  Neuro: Alert, oriented X3, cranial nerves II-XII grossly intact, moves all extremities normally.  Psych: Patient is not psychotic, no suicidal or hemocidal ideation.  Labs on Admission: I have personally reviewed following labs and imaging studies  CBC: Recent Labs  Lab 11/08/23 1844  WBC 15.0*  NEUTROABS 10.1*  HGB 16.2  HCT 47.5  MCV 85.4  PLT 306   Basic Metabolic Panel: Recent Labs  Lab 11/08/23 1844  NA 138  K 4.0  CL 99  CO2 24  GLUCOSE 432*  BUN 12  CREATININE 0.97  CALCIUM  9.2   GFR: Estimated Creatinine Clearance: 85 mL/min (by C-G formula based on SCr of 0.97 mg/dL). Liver Function Tests: Recent Labs  Lab 11/08/23 1844  AST 25  ALT 26  ALKPHOS 89  BILITOT 0.8  PROT 7.7  ALBUMIN 4.2   No results for input(s): LIPASE, AMYLASE in the last 168 hours. No results for input(s): AMMONIA in the last 168 hours. Coagulation Profile: Recent Labs  Lab 11/08/23 1844  INR  0.9   Cardiac Enzymes: No results for input(s): CKTOTAL, CKMB, CKMBINDEX, TROPONINI in the  last 168 hours. BNP (last 3 results) No results for input(s): PROBNP in the last 8760 hours. HbA1C: No results for input(s): HGBA1C in the last 72 hours. CBG: Recent Labs  Lab 11/08/23 2024  GLUCAP 382*   Lipid Profile: Recent Labs    11/08/23 1844  CHOL 217*  HDL 33*  LDLCALC UNABLE TO CALCULATE IF TRIGLYCERIDE OVER 400 mg/dL  TRIG 349*  CHOLHDL 6.6   Thyroid Function Tests: No results for input(s): TSH, T4TOTAL, FREET4, T3FREE, THYROIDAB in the last 72 hours. Anemia Panel: No results for input(s): VITAMINB12, FOLATE, FERRITIN, TIBC, IRON, RETICCTPCT in the last 72 hours. Urine analysis:    Component Value Date/Time   COLORURINE YELLOW (A) 10/13/2020 1555   APPEARANCEUR CLEAR (A) 10/13/2020 1555   APPEARANCEUR Clear 08/06/2012 1005   LABSPEC 1.030 10/13/2020 1555   LABSPEC 1.010 08/06/2012 1005   PHURINE 5.0 10/13/2020 1555   GLUCOSEU >=500 (A) 10/13/2020 1555   GLUCOSEU Negative 08/06/2012 1005   HGBUR NEGATIVE 10/13/2020 1555   BILIRUBINUR NEGATIVE 10/13/2020 1555   BILIRUBINUR Negative 08/06/2012 1005   KETONESUR NEGATIVE 10/13/2020 1555   PROTEINUR NEGATIVE 10/13/2020 1555   NITRITE NEGATIVE 10/13/2020 1555   LEUKOCYTESUR NEGATIVE 10/13/2020 1555   LEUKOCYTESUR Negative 08/06/2012 1005   Sepsis Labs: @LABRCNTIP (procalcitonin:4,lacticidven:4) )No results found for this or any previous visit (from the past 240 hours).   Radiological Exams on Admission:   Assessment/Plan Principal Problem:   STEMI involving oth coronary artery of inferior wall (HCC) Active Problems:   Leukocytosis   HTN (hypertension)   Mixed hyperlipidemia   Poorly controlled type 2 diabetes mellitus with peripheral neuropathy (HCC)   CVA (cerebrovascular accident) (HCC)   Emphysema of lung (HCC)   Nicotine  abuse   Overweight (BMI 25.0-29.9)   Assessment and Plan:  STEMI involving oth coronary artery of inferior wall [facility]): EKG showed STEMI. Dr.  Ammon of cardiology was consulted, who did urgent cardiac cath with 2 stents placement to RCA. Pt's chest pain has subsided after the procedure.  Initial troponin is 2.  - admit to ICU as inpatient - Trend Trop - prn Morphine, Percocet, Tylenol  for pain - prn Nitroglycerin - Dual antiplatelet therapy uninterrupted x 1 year per Dr. Paraschos - Statin: Lipitor 80 mg daily -Patient is receiving tirofiban -Newly started metoprolol succinate 25 mg daily per Dr. Ammon - Risk factor stratification: will check FLP and A1C  - check UDS - 2d echo - f/u CXR  Leukocytosis: WBC 15.0, no fever.  No source of infection identified.  Likely reactive - Follow-up by CBC  HTN (hypertension) -IV hydralazine as needed - Continue home amlodipine  and lisinopril  - Will hold [place]  since patient is newly started metoprolol   Mixed hyperlipidemia -Lipitor  Poorly controlled type 2 diabetes mellitus with peripheral neuropathy [facility]): Recent A1c 7.7, poorly controlled.  Patient is taking metformin , Humalog , Ozempic , glargine insulin  45 units daily -SSI - Glargine insulin  40 units daily  Hx of CVA (cerebrovascular accident) (HCC) -On aspirin , Plavix  and Lipitor  Emphysema of lung (HCC): -Bronchodilators as needed Mucinex  Nicotine  abuse -Did counsel about importance of quitting smoking -Nicotine  patch  Overweight (BMI 25.0-29.9): Body weight 81.6 kg, BMI 26.58 - Encouraged losing weight - Has assessed healthy diet      DVT ppx: SCD  Code Status: Full code   Family Communication:     not done, no family member is at bed side.  Disposition Plan:  Anticipate discharge back to previous environment  Consults called: Dr. Ammon of cardiology  Admission status and Level of care: ICU:  as inpt        Dispo: The patient is from: Home              Anticipated d/c is to: Home              Anticipated d/c date is: 2 days              Patient currently is not medically stable  to d/c.    Severity of Illness:  The appropriate patient status for this patient is INPATIENT. Inpatient status is judged to be reasonable and necessary in order to provide the required intensity of service to ensure the patient's safety. The patient's presenting symptoms, physical exam findings, and initial radiographic and laboratory data in the context of their chronic comorbidities is felt to place them at high risk for further clinical deterioration. Furthermore, it is not anticipated that the patient will be medically stable for discharge from the hospital within 2 midnights of admission.   * I certify that at the point of admission it is my clinical judgment that the patient will require inpatient hospital care spanning beyond 2 midnights from the point of admission due to high intensity of service, high risk for further deterioration and high frequency of surveillance required.*       Date of Service 11/08/2023    Caleb Exon [facility]   If 7PM-7AM, please contact night-coverage [email or web address] 11/08/2023, 9:07 PM

## 2023-11-08 NOTE — Progress Notes (Signed)
 Provided wife supportive presence during STEMI. Wife reports Henry Anderson had a stroke 5 years ago. She has been taking care of him. She is supported by their two children, a grandchild and her mother. Offered updates and prayer. Took her to ICU waiting area and handed off to ICU staff.     11/08/23 2000  Spiritual Encounters  Type of Visit Initial  Care provided to: Pt and family  Conversation partners present during encounter Physician  Referral source Code page  Reason for visit Code  OnCall Visit Yes

## 2023-11-08 NOTE — Consult Note (Signed)
 [facility] Cardiology  CARDIOLOGY CONSULT NOTE  Patient ID: Henry Anderson MRN: [id number] DOB/AGE: 07/02/1965 58 y.o.  Admit date: 11/08/2023 Referring Physician  Primary Physician Fernand La Primary Cardiologist Fernand Alter Reason for Consultation inferior STEMI  HPI: The patient is a 58 year old gentleman referred for inferior STEMI.  The patient has a history of prior CVA, type 2 diabetes, essential hypertension and hyperlipidemia.  He was in his usual state of health until this evening when he developed 8 out of 10 substernal chest pain.  EMS was called and the patient was brought to [facility] ED where ECG revealed ST elevations in leads II, III and aVF consistent with inferior STEMI.  The patient was taken directly to the cardiac catheterization laboratory where coronary angiography revealed occluded proximal RCA.  The patient underwent primary PCI receiving overlapping 3.0 x 30 mm and 3.5 x 18 mm Onyx frontier drug-eluting stents in the proximal and mid RCA with resolution of chest pain.  Review of systems complete and found to be negative unless listed above     Past Medical History:  Diagnosis Date   Diabetes mellitus without complication (HCC)    Emphysema of lung (HCC)    Hypertension    Stroke [facility])     Past Surgical History:  Procedure Laterality Date   FOOT SURGERY Right    KNEE SURGERY Right    SHOULDER SURGERY Left     Medications Prior to Admission  Medication Sig Dispense Refill Last Dose/Taking   acetaminophen  (TYLENOL ) 325 MG tablet Take 2 tablets (650 mg total) by mouth every 4 (four) hours as needed for mild pain (or temp > 37.5 C (99.5 F)).      albuterol  (PROAIR  HFA) 108 (90 Base) MCG/ACT inhaler Inhale 2 puffs into the lungs every 6 (six) hours as needed for wheezing or shortness of breath. 18 g 2    amLODipine  (NORVASC ) 5 MG tablet Take 1 tablet (5 mg total) by mouth daily. 30 tablet 11    Aspirin  (VAZALORE ) 81 MG CAPS Take 1 tablet by mouth daily.       atorvastatin  (LIPITOR) 80 MG tablet Take 1 tablet (80 mg total) by mouth daily. 90 tablet 3    baclofen  (LIORESAL ) 10 MG tablet Take 1 tablet (10 mg total) by mouth daily. 30 tablet 1    blood glucose meter kit and supplies KIT Use as directed 1 each 0    chlorthalidone  (HYGROTON ) 25 MG tablet Take 1 tablet (25 mg total) by mouth once daily. 90 tablet 2    Cholecalciferol  (VITAMIN D3) 1.25 MG (50000 UT) CAPS Take 1 capsule (1.25 mg total) by mouth once a week. 12 capsule 3    clopidogrel  (PLAVIX ) 75 MG tablet Take 1 tablet (75 mg total) by mouth once daily. 90 tablet 3    dapagliflozin  propanediol (FARXIGA ) 10 MG TABS tablet Take 1 tablet (10 mg total) by mouth daily before breakfast. 30 tablet 4    gabapentin  (NEURONTIN ) 100 MG capsule Take 1 capsule (100 mg total) by mouth at bedtime. 90 capsule 2    glucose blood (ACCU-CHEK GUIDE TEST) test strip Use to check blood glucose up to 4 times a day 100 each 3    Insulin  Glargine (BASAGLAR  KWIKPEN) 100 UNIT/ML Inject 45 Units into the skin daily. 30 mL 6    insulin  lispro (HUMALOG  KWIKPEN) 200 UNIT/ML KwikPen No insulin  if blood sugar less than 150, Inject 3 units if 151-200, inject 6 units if 201-250, inject 9 units if 251-300,  inject 12 units if 301-350. Sliding scale with 12 units/day max 3 mL 0    Insulin  Pen Needle (TRUEPLUS 5-BEVEL PEN NEEDLES) 32G X 4 MM MISC Inject 1 Needle with Basaglar  as directed daily. 100 each 2    Insulin  Pen Needle 31G X 6 MM MISC 1 each by Does not apply route 2 (two) times daily as needed. 100 each 1    lisinopril  (ZESTRIL ) 20 MG tablet Take 1 tablet (20 mg total) by mouth once daily. 90 tablet 2    metFORMIN  (GLUCOPHAGE -XR) 500 MG 24 hr tablet Take 2 tablets (1,000 mg total) by mouth daily with breakfast. 180 tablet 1    mometasone -formoterol  (DULERA ) 100-5 MCG/ACT AERO Inhale 2 puffs into the lungs 2 (two) times daily. 1 each 2    naproxen sodium (ALEVE) 220 MG tablet Take 220 mg by mouth daily as needed.       ondansetron  (ZOFRAN ) 4 MG tablet Take 1 tablet (4 mg total) by mouth every 8 (eight) hours as needed for nausea or vomiting. 20 tablet 0    pantoprazole  (PROTONIX ) 40 MG tablet Take 1 tablet (40 mg total) by mouth daily. 90 tablet 0    Rightest GL300 Lancets MISC Use up to four times daily as directed to check blood sugar. 100 each 11    Semaglutide , 2 MG/DOSE, (OZEMPIC , 2 MG/DOSE,) 8 MG/3ML SOPN Inject 2 mg into the skin once a week. 3 mL 0    Social History   Socioeconomic History   Marital status: Married    Spouse name: Not on file   Number of children: Not on file   Years of education: Not on file   Highest education level: Not on file  Occupational History   Not on file  Tobacco Use   Smoking status: Every Day    Current packs/day: 1.50    Average packs/day: 1.5 packs/day for 30.0 years (45.0 ttl pk-yrs)    Types: Cigarettes   Smokeless tobacco: Former    Types: Chew   Tobacco comments:    Patient has cut down to ~1 ppd since having stroke. Gave [place] Quit info. Patient down to 1/2 ppd  Vaping Use   Vaping status: Never Used  Substance and Sexual Activity   Alcohol use: Yes    Comment: occasionally 1-2 beers   Drug use: Never   Sexual activity: Not on file  Other Topics Concern   Not on file  Social History Narrative   Not on file   Social Drivers of Health   Financial Resource Strain: High Risk (11/23/2021)   Overall Financial Resource Strain (CARDIA)    Difficulty of Paying Living Expenses: Very hard  Food Insecurity: Food Insecurity Present (10/27/2022)   Hunger Vital Sign    Worried About Running Out of Food in the Last Year: Often true    Ran Out of Food in the Last Year: Often true  Transportation Needs: No Transportation Needs (10/27/2022)   PRAPARE - Administrator, Civil Service (Medical): No    Lack of Transportation (Non-Medical): No  Physical Activity: Inactive (11/23/2021)   Exercise Vital Sign    Days of Exercise per Week: 0 days     Minutes of Exercise per Session: 0 min  Stress: Stress Concern Present (11/23/2021)   [employer] of Occupational Health - Occupational Stress Questionnaire    Feeling of Stress : Very much  Social Connections: Socially Isolated (11/23/2021)   Social Connection and Isolation Panel    Frequency  of Communication with Friends and Family: Never    Frequency of Social Gatherings with Friends and Family: Never    Attends Religious Services: Never    Database Administrator or Organizations: No    Attends Banker Meetings: Never    Marital Status: Married  Catering Manager Violence: Not At Risk (10/27/2022)   Humiliation, Afraid, Rape, and Kick questionnaire    Fear of Current or Ex-Partner: No    Emotionally Abused: No    Physically Abused: No    Sexually Abused: No    Family History  Problem Relation Age of Onset   COPD Mother    Diabetes Father    Diabetes Sister    Diabetes Sister    COPD Maternal Grandmother    Heart attack Maternal Grandfather    Diabetes Paternal Grandmother    Hyperlipidemia Paternal Grandmother    Hypertension Paternal Grandmother    Diabetes Paternal Grandfather    Hyperlipidemia Paternal Grandfather    Hypertension Paternal Grandfather       Review of systems complete and found to be negative unless listed above      PHYSICAL EXAM  General: Well developed, well nourished, in no acute distress HEENT:  Normocephalic and atramatic Neck:  No JVD.  Lungs: Clear bilaterally to auscultation and percussion. Heart: HRRR . Normal S1 and S2 without gallops or murmurs.  Abdomen: Bowel sounds are positive, abdomen soft and non-tender  Msk:  Back normal, normal gait. Normal strength and tone for age. Extremities: No clubbing, cyanosis or edema.   Neuro: Alert and oriented X 3. Psych:  Good affect, responds appropriately  Labs:   Lab Results  Component Value Date   WBC 15.0 (H) 11/08/2023   HGB 16.2 11/08/2023   HCT 47.5 11/08/2023    MCV 85.4 11/08/2023   PLT 306 11/08/2023    Recent Labs  Lab 11/08/23 1844  NA 138  K 4.0  CL 99  CO2 24  BUN 12  CREATININE 0.97  CALCIUM  9.2  PROT 7.7  BILITOT 0.8  ALKPHOS 89  ALT 26  AST 25  GLUCOSE 432*   No results found for: CKTOTAL, CKMB, CKMBINDEX, TROPONINI  Lab Results  Component Value Date   CHOL 217 (H) 11/08/2023   CHOL 136 07/27/2023   CHOL 101 04/14/2023   Lab Results  Component Value Date   HDL 33 (L) 11/08/2023   HDL 33 (L) 07/27/2023   HDL 25 (L) 04/14/2023   Lab Results  Component Value Date   LDLCALC UNABLE TO CALCULATE IF TRIGLYCERIDE OVER 400 mg/dL 89/70/7974   LDLCALC 74 07/27/2023   LDLCALC 57 04/14/2023   Lab Results  Component Value Date   TRIG 650 (H) 11/08/2023   TRIG 167 (H) 07/27/2023   TRIG 101 04/14/2023   Lab Results  Component Value Date   CHOLHDL 6.6 11/08/2023   CHOLHDL 3.9 10/13/2022   CHOLHDL 4.3 07/07/2021   Lab Results  Component Value Date   LDLDIRECT 109.2 (H) 10/14/2020      Radiology: CARDIAC CATHETERIZATION Result Date: 11/08/2023   Prox RCA lesion is 100% stenosed.   Mid RCA lesion is 90% stenosed.   RPAV lesion is 100% stenosed.   A drug-eluting stent was successfully placed using a STENT ONYX FRONTIER 3.5X18.   A drug-eluting stent was successfully placed using a STENT ONYX FRONTIER 3.0X30.   using a BALLOON MINITREK RX 2.0X12.   Post intervention, there is a 0% residual stenosis.   Post intervention, there  is a 0% residual stenosis.   Post intervention, there is a 100% residual stenosis.   The left ventricular systolic function is normal.   LV end diastolic pressure is normal.   The left ventricular ejection fraction is 50-55% by visual estimate. 1.  Inferior STEMI 2.  Occluded proximal RCA 3.  Overall normal left ventricular ejection fraction 4.  Successful primary PCI with overlapping 3.5 x 18 mm and 3.0 x 30 mm Onyx frontier drug-eluting stents proximal and mid RCA Recommendations 1.  Dual  antiplatelet therapy uninterrupted x 1 year 2.  Start high intensity atorvastatin  3.  Start metoprolol succinate 25 mg daily 4.  2D echocardiogram   EKG: Sinus rhythm with 2 to 3 mm ST elevation in leads II, III and aVF  ASSESSMENT AND PLAN:   1.  Inferior STEMI, status post primary PCI receiving overlapping DES proximal and mid RCA 2.  Preserved left ventricular function, with mild inferior hypokinesis 3.  History of prior CVA 4.  Essential hypertension 5.  Hyperlipidemia 6.  Type 2 diabetes  Recommendations  1.  Dual antiplatelet therapy uninterrupted x 1 year 2.  Start high intensity atorvastatin  80 mg daily 3.  Start metoprolol succinate 25 mg daily 4.  2D echocardiogram in a.m.  Signed: Marsa Dooms MD,PhD, FACC 11/08/2023, 8:35 PM

## 2023-11-08 NOTE — ED Notes (Signed)
 MD ray at bedside at this time.

## 2023-11-08 NOTE — ED Provider Notes (Addendum)
 [facility] Provider Note    Event Date/Time   First MD Initiated Contact with Patient 11/08/23 1859     (approximate)   History   Code STEMI   HPI  Henry Anderson is a 58 year old male presenting with chest pain as a code STEMI.  Patient reports that shortly prior to presentation he had onset of chest pain.  On EMS arrival patient was diaphoretic.  Patient took 324 of aspirin  prior to presentation.  Did have elevated BGL in the 400s with EMS.  Patient reports the pain is sharp in the left side of his chest, nonradiating.      Physical Exam   Triage Vital Signs: ED Triage Vitals  Encounter Vitals Group     BP 11/08/23 1854 (!) 131/102     Girls Systolic BP Percentile --      Girls Diastolic BP Percentile --      Boys Systolic BP Percentile --      Boys Diastolic BP Percentile --      Pulse Rate 11/08/23 1853 87     Resp 11/08/23 1853 (!) 23     Temp 11/08/23 1854 97.8 F (36.6 C)     Temp Source 11/08/23 1854 Oral     SpO2 11/08/23 1853 99 %     Weight 11/08/23 1852 180 lb (81.6 kg)     Height 11/08/23 2030 5' 9 (1.753 m)     Head Circumference --      Peak Flow --      Pain Score 11/08/23 1848 8     Pain Loc --      Pain Education --      Exclude from Growth Chart --     Most recent vital signs: Vitals:   11/08/23 2245 11/08/23 2300  BP: 125/78 115/75  Pulse: 83 85  Resp: (!) 23 (!) 23  Temp:    SpO2: 97% 95%     General: Awake, interactive  CV:  Good peripheral perfusion Resp:  Unlabored respirations, lungs clear to auscultation Abd:  Nondistended.  Neuro:  Symmetric facial movement, fluid speech   ED Results / Procedures / Treatments   Labs (all labs ordered are listed, but only abnormal results are displayed) Labs Reviewed  CBC WITH DIFFERENTIAL/PLATELET - Abnormal; Notable for the following components:      Result Value   WBC 15.0 (*)    Neutro Abs 10.1 (*)    All other components within normal limits  APTT -  Abnormal; Notable for the following components:   aPTT 23 (*)    All other components within normal limits  COMPREHENSIVE METABOLIC PANEL WITH GFR - Abnormal; Notable for the following components:   Glucose, Bld 432 (*)    All other components within normal limits  LIPID PANEL - Abnormal; Notable for the following components:   Cholesterol 217 (*)    Triglycerides 650 (*)    HDL 33 (*)    All other components within normal limits  GLUCOSE, CAPILLARY - Abnormal; Notable for the following components:   Glucose-Capillary 382 (*)    All other components within normal limits  HEMOGLOBIN A1C - Abnormal; Notable for the following components:   Hgb A1c MFr Bld 9.7 (*)    All other components within normal limits  URINE DRUG SCREEN, QUALITATIVE ([facility] ONLY) - Abnormal; Notable for the following components:   Amphetamines, Ur Screen POSITIVE (*)    Benzodiazepine, Ur Scrn POSITIVE (*)    All other  components within normal limits  TROPONIN I (HIGH SENSITIVITY) - Abnormal; Notable for the following components:   Troponin I (High Sensitivity) 18,962 (*)    All other components within normal limits  PROTIME-INR  MISC [facility] TEST (SEND OUT)  CBC  BASIC METABOLIC PANEL WITH GFR  LIPOPROTEIN A (LPA)  CG4 I-STAT (LACTIC ACID)  POCT ACTIVATED CLOTTING TIME  I-STAT CG4 LACTIC ACID, ED  TROPONIN I (HIGH SENSITIVITY)     EKG EKG independently reviewed and interpreted by myself demonstrates:  EKG demonstrates ST elevation prominently in the inferior leads with reciprocal depressions in the lateral leads concerning for inferior STEMI, code STEMI activated prehospital  RADIOLOGY Imaging independently reviewed and interpreted by myself demonstrates:   Formal Radiology Read:  [facility] Chest Port 1 View Result Date: 11/08/2023 EXAM: 1 VIEW(S) XRAY OF THE CHEST 11/08/2023 09:18:00 PM COMPARISON: Chest x-[facility] [place] 02/09/2022, CT chest 10/05/2023. CLINICAL HISTORY: 355200 Chest pain [id number]. Patient in ICU after  cardiac cath due to code STEMI today. Stated 8/10 chest pain and diaphoresis to EMS. Hx of diabetes, HTN, stroke, emphysema of lung, and current smoker. FINDINGS: LUNGS AND PLEURA: No focal pulmonary opacity. No pulmonary edema. No pleural effusion. No pneumothorax. HEART AND MEDIASTINUM: No acute abnormality of the cardiac and mediastinal silhouettes. BONES AND SOFT TISSUES: No acute osseous abnormality. IMPRESSION: 1. No acute cardiopulmonary process. Electronically signed by: Morgane Naveau MD 11/08/2023 09:24 PM EDT RP Workstation: [id number]   CARDIAC CATHETERIZATION Result Date: 11/08/2023   Prox RCA lesion is 100% stenosed.   Mid RCA lesion is 90% stenosed.   RPAV lesion is 100% stenosed.   A drug-eluting stent was successfully placed using a STENT ONYX FRONTIER 3.5X18.   A drug-eluting stent was successfully placed using a STENT ONYX FRONTIER 3.0X30.   using a BALLOON MINITREK RX 2.0X12.   Post intervention, there is a 0% residual stenosis.   Post intervention, there is a 0% residual stenosis.   Post intervention, there is a 100% residual stenosis.   The left ventricular systolic function is normal.   LV end diastolic pressure is normal.   The left ventricular ejection fraction is 50-55% by visual estimate. 1.  Inferior STEMI 2.  Occluded proximal RCA 3.  Overall normal left ventricular ejection fraction 4.  Successful primary PCI with overlapping 3.5 x 18 mm and 3.0 x 30 mm Onyx frontier drug-eluting stents proximal and mid RCA Recommendations 1.  Dual antiplatelet therapy uninterrupted x 1 year 2.  Start high intensity atorvastatin  3.  Start metoprolol succinate 25 mg daily 4.  2D echocardiogram   PROCEDURES:  Critical Care performed: Yes, see critical care procedure note(s)  CRITICAL CARE Performed by: Nilsa Dade   Total critical care time: 20 minutes  Critical care time was exclusive of separately billable procedures and treating other patients.  Critical care was necessary to treat or  prevent imminent or life-threatening deterioration.  Critical care was time spent personally by me on the following activities: development of treatment plan with patient and/or surrogate as well as nursing, discussions with consultants, evaluation of patient's response to treatment, examination of patient, obtaining history from patient or surrogate, ordering and performing treatments and interventions, ordering and review of laboratory studies, ordering and review of radiographic studies, pulse oximetry and re-evaluation of patient's condition.   Procedures   MEDICATIONS ORDERED IN ED: Medications  0.9 %  sodium chloride  infusion ( Intravenous Infusion Verify 11/08/23 2300)  labetalol (NORMODYNE) injection 10 mg (has no administration in time range)  hydrALAZINE (  APRESOLINE) injection 10 mg (has no administration in time range)  acetaminophen  (TYLENOL ) tablet 650 mg (has no administration in time range)  ondansetron  (ZOFRAN ) injection 4 mg (has no administration in time range)  free water 500 mL (500 mLs Oral Not Given 11/08/23 2139)  sodium chloride  flush (NS) 0.9 % injection 3 mL (3 mLs Intravenous Given 11/08/23 2140)  sodium chloride  flush (NS) 0.9 % injection 3 mL (has no administration in time range)  0.9 %  sodium chloride  infusion (has no administration in time range)  aspirin  chewable tablet 81 mg (has no administration in time range)  clopidogrel  (PLAVIX ) tablet 75 mg (has no administration in time range)  dextromethorphan-guaiFENesin (MUCINEX DM) 30-600 MG per 12 hr tablet 1 tablet (has no administration in time range)  acetaminophen  (TYLENOL ) tablet 650 mg (has no administration in time range)  morphine (PF) 2 MG/ML injection 2 mg (has no administration in time range)  oxyCODONE-acetaminophen  (PERCOCET/ROXICET) 5-325 MG per tablet 1 tablet (has no administration in time range)  nitroGLYCERIN (NITROSTAT) SL tablet 0.4 mg (has no administration in time range)  nicotine  (NICODERM  CQ - dosed in mg/24 hours) patch 21 mg (21 mg Transdermal Patch Applied 11/08/23 2140)  metoprolol succinate (TOPROL-XL) 24 hr tablet 25 mg (25 mg Oral Given 11/08/23 2139)  albuterol  (PROVENTIL ) (2.5 MG/3ML) 0.083% nebulizer solution 2.5 mg (has no administration in time range)  amLODipine  (NORVASC ) tablet 5 mg (5 mg Oral Given 11/08/23 2139)  atorvastatin  (LIPITOR) tablet 80 mg (80 mg Oral Given 11/08/23 2139)  lisinopril  (ZESTRIL ) tablet 20 mg (20 mg Oral Given 11/08/23 2139)  pantoprazole  (PROTONIX ) EC tablet 40 mg (40 mg Oral Given 11/08/23 2139)  gabapentin  (NEURONTIN ) capsule 100 mg (100 mg Oral Given 11/08/23 2139)  fluticasone  furoate-vilanterol (BREO ELLIPTA) 100-25 MCG/ACT 1 puff (has no administration in time range)  insulin  glargine-yfgn (SEMGLEE ) injection 40 Units (40 Units Subcutaneous Not Given 11/08/23 2132)  hydrALAZINE (APRESOLINE) injection 5 mg (has no administration in time range)  insulin  aspart (novoLOG ) injection 0-9 Units (has no administration in time range)  insulin  aspart (novoLOG ) injection 0-5 Units (5 Units Subcutaneous Given 11/08/23 2147)  tirofiban (AGGRASTAT) infusion 50 mcg/mL 100 mL (0.15 mcg/kg/min  81.6 kg Intravenous Infusion Verify 11/08/23 2300)  Oral care mouth rinse (has no administration in time range)  clopidogrel  (PLAVIX ) tablet 300 mg (300 mg Oral Given 11/08/23 1856)  tirofiban (AGGRASTAT) infusion 50 mcg/mL 250 mL (0.15 mcg/kg/min  81.6 kg Intravenous New Bag/Given 11/08/23 1934)     IMPRESSION / MDM / ASSESSMENT AND PLAN / ED COURSE  I reviewed the triage vital signs and the nursing notes.  Differential diagnosis includes, but is not limited to, STEMI, no headache or back pain suggestive of SAH or dissection   Patient's presentation is most consistent with acute presentation with potential threat to life or bodily function.  58 year old male presenting to the Emergency Department for evaluation of chest pain as a code STEMI.  EKG here  remains concerning for STEMI.  Received aspirin  prior to presentation.  Heparin initially ordered, held at the request of Dr. Ammon.  Patient taken to the Cath Lab for further management.      FINAL CLINICAL IMPRESSION(S) / ED DIAGNOSES   Final diagnoses:  Acute ST elevation myocardial infarction (STEMI) involving right coronary artery (HCC)     Rx / DC Orders   ED Discharge Orders          Ordered    AMB Referral to Cardiac Rehabilitation - Phase  II        11/08/23 2011             Note:  This document was prepared using Dragon voice recognition software and may include unintentional dictation errors.   Levander Slate, MD 11/08/23 [id number]    Levander Slate, MD 11/28/23 [phone]

## 2023-11-08 NOTE — ED Notes (Signed)
 Per MD hold heparin and give 300mg  plavix .

## 2023-11-08 NOTE — ED Triage Notes (Signed)
 Pt to ED via [facility] from home CODE STEMI. Pt began sweating and coughing and began having 10 out of 10 chest pain. EMS reports pt was diaphoretic on arrival. Pt taken 325 ASA and insulin  at home.  BP 130/70 HR 87 SPO2 98 CBG 474

## 2023-11-08 NOTE — ED Notes (Signed)
 Cardiology at bedside at this time

## 2023-11-08 NOTE — ED Notes (Signed)
 Pt transported to cath lab at this time.

## 2023-11-08 NOTE — ED Notes (Signed)
 Per MD Ray, give 4000units of heparin.

## 2023-11-09 ENCOUNTER — Encounter: Payer: Self-pay | Admitting: Cardiology

## 2023-11-09 ENCOUNTER — Inpatient Hospital Stay: Admit: 2023-11-09 | Discharge: 2023-11-09 | Disposition: A | Attending: Internal Medicine | Admitting: Internal Medicine

## 2023-11-09 ENCOUNTER — Ambulatory Visit: Admitting: Internal Medicine

## 2023-11-09 ENCOUNTER — Telehealth: Payer: Self-pay | Admitting: Student in an Organized Health Care Education/Training Program

## 2023-11-09 DIAGNOSIS — I2119 ST elevation (STEMI) myocardial infarction involving other coronary artery of inferior wall: Secondary | ICD-10-CM

## 2023-11-09 LAB — BASIC METABOLIC PANEL WITH GFR
Anion gap: 10 (ref 5–15)
BUN: 13 mg/dL (ref 6–20)
CO2: 25 mmol/L (ref 22–32)
Calcium: 8.4 mg/dL — ABNORMAL LOW (ref 8.9–10.3)
Chloride: 104 mmol/L (ref 98–111)
Creatinine, Ser: 0.87 mg/dL (ref 0.61–1.24)
GFR, Estimated: >60 mL/min (ref >60)
Glucose, Bld: 272 mg/dL — ABNORMAL HIGH (ref 70–99)
Potassium: 3.9 mmol/L (ref 3.5–5.1)
Sodium: 139 mmol/L (ref 135–145)

## 2023-11-09 LAB — GLUCOSE, CAPILLARY
Glucose-Capillary: 257 mg/dL — ABNORMAL HIGH (ref 70–99)
Glucose-Capillary: 229 mg/dL — ABNORMAL HIGH (ref 70–99)
Glucose-Capillary: 183 mg/dL — ABNORMAL HIGH (ref 70–99)
Glucose-Capillary: 165 mg/dL — ABNORMAL HIGH (ref 70–99)

## 2023-11-09 LAB — ECHOCARDIOGRAM COMPLETE
AR max vel: 2.53 cm2
AV Area VTI: 2.88 cm2
AV Area mean vel: 2.24 cm2
AV Mean grad: 2 mmHg
AV Peak grad: 4.6 mmHg
Ao pk vel: 1.07 m/s
Area-P 1/2: 2.93 cm2
Calc EF: 61.3 %
Height: 69 in
MV VTI: 2.06 cm2
S' Lateral: 2.7 cm
Single Plane A2C EF: 56.6 %
Single Plane A4C EF: 65.8 %
Weight: 2878.33 [oz_av]

## 2023-11-09 LAB — CBC
HCT: 40.7 % (ref 39.0–52.0)
Hemoglobin: 13.5 g/dL (ref 13.0–17.0)
MCH: 28.6 pg (ref 26.0–34.0)
MCHC: 33.2 g/dL (ref 30.0–36.0)
MCV: 86.2 fL (ref 80.0–100.0)
Platelets: 262 K/uL (ref 150–400)
RBC: 4.72 MIL/uL (ref 4.22–5.81)
RDW: 12.4 % (ref 11.5–15.5)
WBC: 13.9 K/uL — ABNORMAL HIGH (ref 4.0–10.5)
nRBC: 0 % (ref 0.0–0.2)

## 2023-11-09 LAB — LACTIC ACID, PLASMA
Lactic Acid, Venous: 2 mmol/L (ref 0.5–1.9)
Lactic Acid, Venous: 1.7 mmol/L (ref 0.5–1.9)

## 2023-11-09 LAB — POCT ACTIVATED CLOTTING TIME: Activated Clotting Time: 781 s

## 2023-11-09 MED ORDER — INSULIN GLARGINE-YFGN 100 UNIT/ML ~~LOC~~ SOLN
45.0000 [IU] | Freq: Every day | SUBCUTANEOUS | Status: DC
  Administered 2023-11-09 – 2023-11-11 (×3): 45 [IU] via SUBCUTANEOUS
  Filled 2023-11-09 (×3): qty 0.45

## 2023-11-09 MED ORDER — MORPHINE SULFATE (PF) 2 MG/ML IV SOLN: 2.0000 mg | INTRAVENOUS | Status: DC | PRN

## 2023-11-09 MED ORDER — SODIUM CHLORIDE 0.9 % IV BOLUS
500.0000 mL | Freq: Once | INTRAVENOUS | Status: AC
  Administered 2023-11-09: 500 mL via INTRAVENOUS

## 2023-11-09 MED ORDER — SODIUM CHLORIDE 0.9 % IV SOLN: INTRAVENOUS | Status: DC

## 2023-11-09 MED ORDER — ASPIRIN 81 MG PO TBEC
81.0000 mg | DELAYED_RELEASE_TABLET | Freq: Every day | ORAL | Status: DC
  Administered 2023-11-09 – 2023-11-11 (×3): 81 mg via ORAL
  Filled 2023-11-09 (×3): qty 1

## 2023-11-09 MED ORDER — CHLORHEXIDINE GLUCONATE CLOTH 2 % EX PADS: 6.0000 | MEDICATED_PAD | Freq: Every day | CUTANEOUS | Status: DC

## 2023-11-09 MED ORDER — CHLORHEXIDINE GLUCONATE CLOTH 2 % EX PADS
6.0000 | MEDICATED_PAD | Freq: Every day | CUTANEOUS | Status: DC
  Administered 2023-11-09: 6 via TOPICAL

## 2023-11-09 MED ORDER — SODIUM CHLORIDE 0.9 % IV BOLUS
250.0000 mL | Freq: Once | INTRAVENOUS | Status: AC
  Administered 2023-11-09: 250 mL via INTRAVENOUS

## 2023-11-09 MED ORDER — PERFLUTREN LIPID MICROSPHERE
1.0000 mL | INTRAVENOUS | Status: AC | PRN
  Administered 2023-11-09: 2 mL via INTRAVENOUS

## 2023-11-09 MED ORDER — SODIUM CHLORIDE 0.9 % IV BOLUS
250.0000 mL | Freq: Once | INTRAVENOUS | Status: DC
Start: 2023-11-09 — End: 2023-11-11

## 2023-11-09 NOTE — TOC Progression Note (Signed)
 Transition of Care [facility]) - Progression Note    Patient Details  Name: Henry Anderson MRN: [id number] Date of Birth: 02/10/1965  Transition of Care [facility]) CM/SW Contact  K'La JINNY Ruts, LCSW Phone Number: 11/09/2023, 11:10 AM  Clinical Narrative:    Chart reviewed. The patient was admitted for STEMI. I was able to speak with the patient, patient wife, and patient son at bedside today. I introduced myself, my role, and reason for consult. The patient reports that he was doing well today. The patient confirmed that he has a PCP. The patient reports that that he lives in the home with his wife and son.   The patient reports that he is able to complete daily living task independently but his wife drove him to medical appointments due to negative vision. The patient reports that his wife will assist him during D/C. The patient reports that he uses [facility]. The patient reports that he has never had HH or been admitted into a SNF in the past.   The patient reports that he has a cane in the home. The patient reports that he would like a walker at D/C. I encouraged the patient to speak with the providers about DME. The patient verbalized understanding. There are no TOC needs at this time.                     Expected Discharge Plan and Services                                               Social Drivers of Health (SDOH) Interventions SDOH Screenings   Food Insecurity: No Food Insecurity (11/09/2023)  Housing: Unknown (11/09/2023)  Transportation Needs: No Transportation Needs (11/09/2023)  Utilities: Not At Risk (11/09/2023)  Alcohol Screen: Low Risk  (11/23/2021)  Depression (PHQ2-9): Low Risk  (01/13/2023)  Financial Resource Strain: High Risk (11/23/2021)  Physical Activity: Inactive (11/23/2021)  Social Connections: Moderately Isolated (11/09/2023)  Stress: Stress Concern Present (11/23/2021)  Tobacco Use: High Risk (11/08/2023)    Readmission Risk  Interventions     No data to display

## 2023-11-09 NOTE — Plan of Care (Signed)

## 2023-11-09 NOTE — Progress Notes (Addendum)
 [facility] CLINIC CARDIOLOGY PROGRESS NOTE   Patient ID: Henry Anderson MRN: [id number] DOB/AGE: 1965/05/29 58 y.o.  Admit date: 11/08/2023 Referring Physician None - STEMI pt Primary Physician Fernand Fredy RAMAN, MD  Primary Cardiologist Dr. Fernand Reason for Consultation STEMI  HPI: Henry Anderson is a 58 y.o. male with a past medical history of essential hypertension, hyperlipidemia, prior CVA, type 2 diabetes who presented to the ED on 11/08/2023 for chest pain. Code STEMI was activated in the field and patient was taken to cath lab emergently.   Interval History:  -Patient seen and examined this AM, resting in bed with wife at bedside.  -Feeling better overall. Still with some mild chest tightness and SOB but this is much improved compared to yesterday. -BP remains soft, he is asymptomatic with this. Encouraged PO intake.  -HR stable, no concerning events on tele.  -Echo to be done this AM.   Review of systems complete and found to be negative unless listed above   Vitals:   11/09/23 0430 11/09/23 0500 11/09/23 0530 11/09/23 0600  BP: (!) 86/65 (!) 82/62 (!) 71/54 (!) 83/59  Pulse: 67 68 63 65  Resp: (!) 25 (!) 24 (!) 23 (!) 22  Temp:      TempSrc:      SpO2: 93% 97%  95%  Weight:      Height:         Intake/Output Summary (Last 24 hours) at 11/09/2023 9176 Last data filed at 11/09/2023 0600 Gross per 24 hour  Intake 1467.01 ml  Output 1650 ml  Net -182.99 ml     PHYSICAL EXAM General: Well appearing male, well nourished, in no acute distress. HEENT: Normocephalic and atraumatic. Neck: No JVD.  Lungs: Normal respiratory effort on room air. Clear bilaterally to auscultation. No wheezes, crackles, rhonchi.  Heart: HRRR. Normal S1 and S2 without gallops or murmurs. Radial & DP pulses 2+ bilaterally. Abdomen: Non-distended appearing.  Msk: Normal strength and tone for age. Extremities: No clubbing, cyanosis or edema.   Neuro: Alert and oriented X 3. Psych: Mood  appropriate, affect congruent.    LABS: Basic Metabolic Panel: Recent Labs    11/08/23 1844 11/09/23 0515  NA 138 139  K 4.0 3.9  CL 99 104  CO2 24 25  GLUCOSE 432* 272*  BUN 12 13  CREATININE 0.97 0.87  CALCIUM  9.2 8.4*   Liver Function Tests: Recent Labs    11/08/23 1844  AST 25  ALT 26  ALKPHOS 89  BILITOT 0.8  PROT 7.7  ALBUMIN 4.2   No results for input(s): LIPASE, AMYLASE in the last 72 hours. CBC: Recent Labs    11/08/23 1844 11/09/23 0515  WBC 15.0* 13.9*  NEUTROABS 10.1*  --   HGB 16.2 13.5  HCT 47.5 40.7  MCV 85.4 86.2  PLT 306 262   Cardiac Enzymes: Recent Labs    11/08/23 1844 11/08/23 2122  TROPONINIHS 2 18,962*   BNP: No results for input(s): BNP in the last 72 hours. D-Dimer: No results for input(s): DDIMER in the last 72 hours. Hemoglobin A1C: Recent Labs    11/08/23 1844  HGBA1C 9.7*   Fasting Lipid Panel: Recent Labs    11/08/23 1844  CHOL 217*  HDL 33*  LDLCALC UNABLE TO CALCULATE IF TRIGLYCERIDE OVER 400 mg/dL  TRIG 349*  CHOLHDL 6.6   Thyroid Function Tests: No results for input(s): TSH, T4TOTAL, T3FREE, THYROIDAB in the last 72 hours.  Invalid input(s): FREET3 Anemia Panel: No results  for input(s): VITAMINB12, FOLATE, FERRITIN, TIBC, IRON, RETICCTPCT in the last 72 hours.  DG Chest Port 1 View Result Date: 11/08/2023 EXAM: 1 VIEW(S) XRAY OF THE CHEST 11/08/2023 09:18:00 PM COMPARISON: Chest x-ray 02/09/2022, CT chest 10/05/2023. CLINICAL HISTORY: 355200 Chest pain [id number]. Patient in ICU after cardiac cath due to code STEMI today. Stated 8/10 chest pain and diaphoresis to EMS. Hx of diabetes, HTN, stroke, emphysema of lung, and current smoker. FINDINGS: LUNGS AND PLEURA: No focal pulmonary opacity. No pulmonary edema. No pleural effusion. No pneumothorax. HEART AND MEDIASTINUM: No acute abnormality of the cardiac and mediastinal silhouettes. BONES AND SOFT TISSUES: No acute osseous  abnormality. IMPRESSION: 1. No acute cardiopulmonary process. Electronically signed by: Morgane Naveau MD 11/08/2023 09:24 PM EDT RP Workstation: [id number]   CARDIAC CATHETERIZATION Result Date: 11/08/2023   Prox RCA lesion is 100% stenosed.   Mid RCA lesion is 90% stenosed.   RPAV lesion is 100% stenosed.   A drug-eluting stent was successfully placed using a STENT ONYX FRONTIER 3.5X18.   A drug-eluting stent was successfully placed using a STENT ONYX FRONTIER 3.0X30.   using a BALLOON MINITREK RX 2.0X12.   Post intervention, there is a 0% residual stenosis.   Post intervention, there is a 0% residual stenosis.   Post intervention, there is a 100% residual stenosis.   The left ventricular systolic function is normal.   LV end diastolic pressure is normal.   The left ventricular ejection fraction is 50-55% by visual estimate. 1.  Inferior STEMI 2.  Occluded proximal RCA 3.  Overall normal left ventricular ejection fraction 4.  Successful primary PCI with overlapping 3.5 x 18 mm and 3.0 x 30 mm Onyx frontier drug-eluting stents proximal and mid RCA Recommendations 1.  Dual antiplatelet therapy uninterrupted x 1 year 2.  Start high intensity atorvastatin  3.  Start metoprolol succinate 25 mg daily 4.  2D echocardiogram    ECHO pending  TELEMETRY (personally reviewed): sinus rhythm rate 60s  EKG (personally reviewed): NSR rate 85 bpm with inferior STE, reciprocal ST depression  DATA reviewed by me 11/09/23: last 24h vitals tele labs imaging I/O, hospitalist progress note  Principal Problem:   STEMI involving oth coronary artery of inferior wall (HCC) Active Problems:   Poorly controlled type 2 diabetes mellitus with peripheral neuropathy (HCC)   Emphysema of lung (HCC)   CVA (cerebrovascular accident) (HCC)   Nicotine  abuse   Mixed hyperlipidemia   Overweight (BMI 25.0-29.9)   Leukocytosis   HTN (hypertension)    ASSESSMENT AND PLAN: Henry Anderson is a 58 y.o. male with a past medical  history of essential hypertension, hyperlipidemia, prior CVA, type 2 diabetes who presented to the ED on 11/08/2023 for chest pain. Code STEMI was activated in the field and patient was taken to cath lab emergently.   # STEMI - RCA # Coronary artery disease # Hypertension # Hyperlipidemia # Type II diabetes # Hx CVA Patient presented to the ED after onset of CP at home, code STEMI activated in the field. EKG with NSR rate 85 bpm with inferior STE, reciprocal ST depression. Trops 2 > 18,962. Taken emergently to the cath lab and received two stents to RCA as above in cath report. Lipid panel with TC 217, HDL 33, Trig 650. A1c 9.7. -Echo pending.  -Will check direct LDL. Continue atorvastatin  80 mg daily.  -Continue aspirin  81 mg daily and plavix  75 mg daily.  -Continue aggrastat infusion - 10 hours to complete this AM at 9:15.  -  Hold off on metoprolol today given hypotension.  -Will continue IVF for now, if EF reduced on echo will DC.   This patient's case was discussed and created with Dr. Custovic and she is in agreement.  Signed:  Danita Bloch, PA-C  11/09/2023, 8:23 AM [facility] Cardiology

## 2023-11-09 NOTE — Inpatient Diabetes Management (Signed)
 Inpatient Diabetes Program Recommendations  AACE/ADA: New Consensus Statement on Inpatient Glycemic Control   Target Ranges:  Prepandial:   less than 140 mg/dL      Peak postprandial:   less than 180 mg/dL (1-2 hours)      Critically ill patients:  140 - 180 mg/dL    Latest Reference Range & Units 11/08/23 20:24 11/08/23 23:57 11/09/23 07:40  Glucose-Capillary 70 - 99 mg/dL 617 (H) 748 (H) 742 (H)    Latest Reference Range & Units 11/08/23 18:44 11/09/23 05:15  Glucose 70 - 99 mg/dL 567 (H) 727 (H)   Review of Glycemic Control  Diabetes history: DM2 Outpatient Diabetes medications: Basaglar  45 units daily, Humalog  3-12 units TID with meals (does not take if CBG less than 150 mg/dl), Metformin  XR 1000 QAM, Ozempic  2 mg Qweek, Farxiga  10 mg daily (not taking) Current orders for Inpatient glycemic control: Semglee  40 units at bedtime, Novolog  0-9 units TID with meals, Novolog  0-5 units QHS  Inpatient Diabetes Program Recommendations:    Insulin : Per MAR, patient did not received Semglee  last night (as patient had stated he took Basaglar  at home prior to coming to the hospital on 10/29.  Please consider changing frequency of Semglee  40 units to daily to start at 10 am today.  If post prandial glucose is consistently over 180 mg/dl, please consider ordering Novolog  4 units TID with meals for meal coverage if patient eats at least 50% of meals.  Thanks, Earnie Gainer, RN, MSN, CDCES Diabetes Coordinator Inpatient Diabetes Program [phone] (Team Pager from 8am to 5pm)

## 2023-11-09 NOTE — Plan of Care (Signed)

## 2023-11-09 NOTE — Plan of Care (Deleted)
   Problem: Education: Goal: Knowledge of General Education information will improve Description Including pain rating scale, medication(s)/side effects and non-pharmacologic comfort measures Outcome: Progressing

## 2023-11-09 NOTE — Progress Notes (Signed)
 Pt arrived to the unit post cath and was hypertensive.  Dr. Hilma at bedside discussed at home medications with patient and ordered his at home bp medications to better control the hypertension. Pt given amlodipine , lisinopril , and metoprolol at 2139. After meds were given pts bp began to drop throughout the course of the shift.  Pt had bps of 80/50s, maps >65.  He remained asymptomatic and mentation was normal. Hospitalist, Dr. Lawence, was contacted and ordered a 250ml fluid bolus. Pt was unresponsive to the bolus and therefore another 500ml fluid bolus was ordered. Pt was again unresponsive and hospitalist considered ordering levophed. On call Cardiologist, Dr. Almetta, was called by primary RN where the situation was explained.  He advised against fluid boluses to avoid hurting his EF and also against starting levophed unless bps continued to drastically drop. He advised holding all morning BP medications and ordered a stat Lactic acid which resulted at 2.0. Cardiologist notified again of the lactic result, to which he stated that no interventions were needed at this time and advised to continue monitoring pts bp and mentation.

## 2023-11-09 NOTE — Plan of Care (Signed)
  Problem: Education: Goal: Knowledge of General Education information will improve Description: Including pain rating scale, medication(s)/side effects and non-pharmacologic comfort measures 11/09/2023 2151 by Fowlkes, Devera Englander, RN Outcome: Progressing 11/09/2023 2150 by Fowlkes, Brendaliz Kuk, RN Outcome: Progressing   Problem: Health Behavior/Discharge Planning: Goal: Ability to manage health-related needs will improve 11/09/2023 2151 by Carilyn Bunk, RN Outcome: Progressing 11/09/2023 2150 by Carilyn Bunk, RN Outcome: Progressing   Problem: Clinical Measurements: Goal: Ability to maintain clinical measurements within normal limits will improve 11/09/2023 2151 by Carilyn Bunk, RN Outcome: Progressing 11/09/2023 2150 by Carilyn Bunk, RN Outcome: Progressing Goal: Will remain free from infection 11/09/2023 2151 by Carilyn Bunk, RN Outcome: Progressing 11/09/2023 2150 by Carilyn Bunk, RN Outcome: Progressing Goal: Diagnostic test results will improve 11/09/2023 2151 by Carilyn Bunk, RN Outcome: Progressing 11/09/2023 2150 by Carilyn Bunk, RN Outcome: Progressing Goal: Respiratory complications will improve 11/09/2023 2151 by Fowlkes, Sandhya Denherder, RN Outcome: Progressing 11/09/2023 2150 by Fowlkes, Shankar Silber, RN Outcome: Progressing Goal: Cardiovascular complication will be avoided 11/09/2023 2151 by Carilyn Bunk, RN Outcome: Progressing 11/09/2023 2150 by Carilyn Bunk, RN Outcome: Progressing   Problem: Activity: Goal: Risk for activity intolerance will decrease 11/09/2023 2151 by Fowlkes, Alazay Leicht, RN Outcome: Progressing 11/09/2023 2150 by Carilyn Bunk, RN Outcome: Progressing   Problem: Nutrition: Goal: Adequate nutrition will be maintained 11/09/2023 2151 by Carilyn Bunk, RN Outcome: Progressing 11/09/2023 2150 by Carilyn Bunk, RN Outcome: Progressing   Problem: Coping: Goal: Level of anxiety will  decrease 11/09/2023 2151 by Carilyn Bunk, RN Outcome: Progressing 11/09/2023 2150 by Carilyn Bunk, RN Outcome: Progressing   Problem: Elimination: Goal: Will not experience complications related to bowel motility 11/09/2023 2151 by Carilyn Bunk, RN Outcome: Progressing 11/09/2023 2150 by Carilyn Bunk, RN Outcome: Progressing Goal: Will not experience complications related to urinary retention 11/09/2023 2151 by Carilyn Bunk, RN Outcome: Progressing 11/09/2023 2150 by Carilyn Bunk, RN Outcome: Progressing   Problem: Pain Managment: Goal: General experience of comfort will improve and/or be controlled 11/09/2023 2151 by Carilyn Bunk, RN Outcome: Progressing 11/09/2023 2150 by Carilyn Bunk, RN Outcome: Progressing

## 2023-11-09 NOTE — Telephone Encounter (Signed)
 Spoke with his bedside ICU nurse. Patient hypotensive this morning after all OP anti-HTN meds started last night (amlodipine  5 mg daily, lisinopril  20 mg daily, toprol XL 25 mg daily). His BP was 80/50s, now 70/50s, MAP 61. Asx and conversant. No post procedure concerns. He is over 1 L IVFs (250 bolus, 500 bolus) and 100/h maintenance. His LVEF was nl on Lvgram. I would not continue to aggressively tx BP if asx. I would recommend against starting vasopressors unless MAPs consistently < 55 and patient has sx with fresh MI and coronary intervention. Would get stat lactate to better assess organ perfusion. Will reassess if lactate signfiicantly elevated. Rec'd d/c/hold all AM anti-HTN meds above for 10 AM.

## 2023-11-09 NOTE — Progress Notes (Signed)
 Progress Note    Henry Anderson  FMW:[id number] DOB: [birth year]  DOA: 11/08/2023 PCP: Fernand Fredy RAMAN, MD      Brief Narrative:    Medical records reviewed and are as summarized below:  Henry Anderson is a 58 y.o. male with medical history significant for insulin -dependent diabetes mellitus, hypertension, hyperlipidemia, diabetes mellitus, COPD, GERD, neuropathy, stroke, tobacco use disorder, who presented to the hospital with severe substernal chest pain that radiated to the left arm.  This was associated with shortness of breath and diaphoresis.  EKG showed inferior wall ST elevation.  Initial troponin was normal but repeat troponin was up to 18,962.  He was found to have acute ST elevation MI. He underwent emergent left heart catheterization with PCI and stent placement to occluded proximal and mid RCA.  Hospitalist team was consulted for admission.        Assessment/Plan:   Principal Problem:   STEMI involving oth coronary artery of inferior wall (HCC) Active Problems:   Leukocytosis   HTN (hypertension)   Mixed hyperlipidemia   Poorly controlled type 2 diabetes mellitus with peripheral neuropathy (HCC)   CVA (cerebrovascular accident) (HCC)   Emphysema of lung (HCC)   Nicotine  abuse   Overweight (BMI 25.0-29.9)    Body mass index is 26.57 kg/m.   Acute inferior STEMI: S/p PCI with stent placement to occluded proximal and mid RCA on 11/08/2023.  Continue uninterrupted dual antiplatelet therapy with low-dose aspirin  and Plavix . 2D echo showed EF estimated at 60 to 65%, normal LV diastolic parameters   Dyslipidemia: Continue high-dose Lipitor Lipid panel showed total cholesterol 217, triglycerides 650, HDL 33, unable to calculate LDL   Hypotension: He required IV fluids including normal saline bolus for hypotension.  Antihypertensives on hold.   Lactic acidosis: Improved from 2-1.7.  Resolved probably from hypotension.   Type II DM with  hyperglycemia: Continue Lantus  (increased dose from 40 to 45 units) daily.  NovoLog  as needed for hyperglycemia. Hemoglobin A1c 9.1. He said he takes Basaglar  45 units nightly and sliding scale Humalog  but he barely uses Humalog  because he said glucose levels are usually in the 150s.   Tobacco use disorder: He has been counseled to stop smoking cigarettes given CAD and history of stroke.    Comorbidities include COPD, hypertension  Diet Order             Diet Heart Room service appropriate? Yes; Fluid consistency: Thin  Diet effective now                                  Consultants: Cardiologist  Procedures: PCI with stent placement to occluded proximal and mid RCA    Medications:    aspirin   81 mg Oral Daily   atorvastatin   80 mg Oral Daily   clopidogrel   75 mg Oral Q breakfast   fluticasone  furoate-vilanterol  1 puff Inhalation Daily   free water  500 mL Oral Once   gabapentin   100 mg Oral QHS   insulin  aspart  0-5 Units Subcutaneous QHS   insulin  aspart  0-9 Units Subcutaneous TID WC   insulin  glargine-yfgn  40 Units Subcutaneous QHS   metoprolol succinate  25 mg Oral Daily   nicotine   21 mg Transdermal Daily   pantoprazole   40 mg Oral Daily   sodium chloride  flush  3 mL Intravenous Q12H   Continuous Infusions:  sodium chloride  20 mL/hr  at 11/09/23 0600   sodium chloride      sodium chloride  100 mL/hr at 11/09/23 0600   sodium chloride      tirofiban 0.15 mcg/kg/min (11/09/23 0618)     Anti-infectives (From admission, onward)    None              Family Communication/Anticipated D/C date and plan/Code Status   DVT prophylaxis: Place and maintain sequential compression device Start: 11/08/23 2101     Code Status: Full Code  Family Communication: None Disposition Plan: Plan to discharge home   Status is: Inpatient Remains inpatient appropriate because: Acute STEMI       Subjective:   Interval events noted.  He  has no complaints.  No dizziness, chest pain or shortness of breath.  He said he feels 100% better.  Objective:    Vitals:   11/09/23 0430 11/09/23 0500 11/09/23 0530 11/09/23 0600  BP: (!) 86/65 (!) 82/62 (!) 71/54 (!) 83/59  Pulse: 67 68 63 65  Resp: (!) 25 (!) 24 (!) 23 (!) 22  Temp:      TempSrc:      SpO2: 93% 97%  95%  Weight:      Height:       No data found.   Intake/Output Summary (Last 24 hours) at 11/09/2023 0905 Last data filed at 11/09/2023 0600 Gross per 24 hour  Intake 1467.01 ml  Output 1650 ml  Net -182.99 ml   Filed Weights   11/08/23 1852 11/08/23 2030  Weight: 81.6 kg 81.6 kg    Exam:  GEN: NAD SKIN: Warm and dry EYES: No pallor or icterus ENT: MMM CV: RRR PULM: CTA B ABD: soft, ND, NT, +BS CNS: AAO x 3, non focal EXT: No edema or tenderness        Data Reviewed:   I have personally reviewed following labs and imaging studies:  Labs: Labs show the following:   Basic Metabolic Panel: Recent Labs  Lab 11/08/23 1844 11/09/23 0515  NA 138 139  K 4.0 3.9  CL 99 104  CO2 24 25  GLUCOSE 432* 272*  BUN 12 13  CREATININE 0.97 0.87  CALCIUM  9.2 8.4*   GFR Estimated Creatinine Clearance: 94.8 mL/min (by C-G formula based on SCr of 0.87 mg/dL). Liver Function Tests: Recent Labs  Lab 11/08/23 1844  AST 25  ALT 26  ALKPHOS 89  BILITOT 0.8  PROT 7.7  ALBUMIN 4.2   No results for input(s): LIPASE, AMYLASE in the last 168 hours. No results for input(s): AMMONIA in the last 168 hours. Coagulation profile Recent Labs  Lab 11/08/23 1844  INR 0.9    CBC: Recent Labs  Lab 11/08/23 1844 11/09/23 0515  WBC 15.0* 13.9*  NEUTROABS 10.1*  --   HGB 16.2 13.5  HCT 47.5 40.7  MCV 85.4 86.2  PLT 306 262   Cardiac Enzymes: No results for input(s): CKTOTAL, CKMB, CKMBINDEX, TROPONINI in the last 168 hours. BNP (last 3 results) No results for input(s): PROBNP in the last 8760 hours. CBG: Recent Labs  Lab  11/08/23 2024 11/08/23 2357 11/09/23 0740  GLUCAP 382* 251* 257*   D-Dimer: No results for input(s): DDIMER in the last 72 hours. Hgb A1c: Recent Labs    11/08/23 1844  HGBA1C 9.7*   Lipid Profile: Recent Labs    11/08/23 1844  CHOL 217*  HDL 33*  LDLCALC UNABLE TO CALCULATE IF TRIGLYCERIDE OVER 400 mg/dL  TRIG 349*  CHOLHDL 6.6   Thyroid function  studies: No results for input(s): TSH, T4TOTAL, T3FREE, THYROIDAB in the last 72 hours.  Invalid input(s): FREET3 Anemia work up: No results for input(s): VITAMINB12, FOLATE, FERRITIN, TIBC, IRON, RETICCTPCT in the last 72 hours. Sepsis Labs: Recent Labs  Lab 11/08/23 1844 11/08/23 1935 11/09/23 0515 11/09/23 0551  WBC 15.0*  --  13.9*  --   LATICACIDVEN  --  0.6  --  2.0*    Microbiology No results found for this or any previous visit (from the past 240 hours).  Procedures and diagnostic studies:  DG Chest Port 1 View Result Date: 11/08/2023 EXAM: 1 VIEW(S) XRAY OF THE CHEST 11/08/2023 09:18:00 PM COMPARISON: Chest x-ray 02/09/2022, CT chest 10/05/2023. CLINICAL HISTORY: 355200 Chest pain [id number]. Patient in ICU after cardiac cath due to code STEMI today. Stated 8/10 chest pain and diaphoresis to EMS. Hx of diabetes, HTN, stroke, emphysema of lung, and current smoker. FINDINGS: LUNGS AND PLEURA: No focal pulmonary opacity. No pulmonary edema. No pleural effusion. No pneumothorax. HEART AND MEDIASTINUM: No acute abnormality of the cardiac and mediastinal silhouettes. BONES AND SOFT TISSUES: No acute osseous abnormality. IMPRESSION: 1. No acute cardiopulmonary process. Electronically signed by: Morgane Naveau MD 11/08/2023 09:24 PM EDT RP Workstation: [id number]   CARDIAC CATHETERIZATION Result Date: 11/08/2023   Prox RCA lesion is 100% stenosed.   Mid RCA lesion is 90% stenosed.   RPAV lesion is 100% stenosed.   A drug-eluting stent was successfully placed using a STENT ONYX FRONTIER 3.5X18.   A  drug-eluting stent was successfully placed using a STENT ONYX FRONTIER 3.0X30.   using a BALLOON MINITREK RX 2.0X12.   Post intervention, there is a 0% residual stenosis.   Post intervention, there is a 0% residual stenosis.   Post intervention, there is a 100% residual stenosis.   The left ventricular systolic function is normal.   LV end diastolic pressure is normal.   The left ventricular ejection fraction is 50-55% by visual estimate. 1.  Inferior STEMI 2.  Occluded proximal RCA 3.  Overall normal left ventricular ejection fraction 4.  Successful primary PCI with overlapping 3.5 x 18 mm and 3.0 x 30 mm Onyx frontier drug-eluting stents proximal and mid RCA Recommendations 1.  Dual antiplatelet therapy uninterrupted x 1 year 2.  Start high intensity atorvastatin  3.  Start metoprolol succinate 25 mg daily 4.  2D echocardiogram              LOS: 1 day   Cintia Gleed  [facility]   Pager on [email or web address]. If 7PM-7AM, please contact night-coverage at [email or web address]     11/09/2023, 9:05 AM

## 2023-11-10 ENCOUNTER — Other Ambulatory Visit (HOSPITAL_COMMUNITY): Payer: Self-pay

## 2023-11-10 ENCOUNTER — Telehealth (HOSPITAL_COMMUNITY): Payer: Self-pay

## 2023-11-10 DIAGNOSIS — I2119 ST elevation (STEMI) myocardial infarction involving other coronary artery of inferior wall: Secondary | ICD-10-CM | POA: Diagnosis not present

## 2023-11-10 LAB — CBC
HCT: 35.8 % — ABNORMAL LOW (ref 39.0–52.0)
Hemoglobin: 12.1 g/dL — ABNORMAL LOW (ref 13.0–17.0)
MCH: 29.3 pg (ref 26.0–34.0)
MCHC: 33.8 g/dL (ref 30.0–36.0)
MCV: 86.7 fL (ref 80.0–100.0)
Platelets: 203 K/uL (ref 150–400)
RBC: 4.13 MIL/uL — ABNORMAL LOW (ref 4.22–5.81)
RDW: 12.9 % (ref 11.5–15.5)
WBC: 12.7 K/uL — ABNORMAL HIGH (ref 4.0–10.5)
nRBC: 0 % (ref 0.0–0.2)

## 2023-11-10 LAB — COMPREHENSIVE METABOLIC PANEL WITH GFR
ALT: 40 U/L (ref 0–44)
AST: 131 U/L — ABNORMAL HIGH (ref 15–41)
Albumin: 2.9 g/dL — ABNORMAL LOW (ref 3.5–5.0)
Alkaline Phosphatase: 67 U/L (ref 38–126)
Anion gap: 9 (ref 5–15)
BUN: 11 mg/dL (ref 6–20)
CO2: 23 mmol/L (ref 22–32)
Calcium: 8.1 mg/dL — ABNORMAL LOW (ref 8.9–10.3)
Chloride: 108 mmol/L (ref 98–111)
Creatinine, Ser: 0.57 mg/dL — ABNORMAL LOW (ref 0.61–1.24)
GFR, Estimated: >60 mL/min (ref >60)
Glucose, Bld: 131 mg/dL — ABNORMAL HIGH (ref 70–99)
Potassium: 3.2 mmol/L — ABNORMAL LOW (ref 3.5–5.1)
Sodium: 140 mmol/L (ref 135–145)
Total Bilirubin: 0.6 mg/dL (ref 0.0–1.2)
Total Protein: 5.9 g/dL — ABNORMAL LOW (ref 6.5–8.1)

## 2023-11-10 LAB — LDL CHOLESTEROL, DIRECT: Direct LDL: 81 mg/dL (ref 0–99)

## 2023-11-10 LAB — MISC LABCORP TEST (SEND OUT): Labcorp test code: 120295

## 2023-11-10 LAB — GLUCOSE, CAPILLARY
Glucose-Capillary: 105 mg/dL — ABNORMAL HIGH (ref 70–99)
Glucose-Capillary: 85 mg/dL (ref 70–99)
Glucose-Capillary: 197 mg/dL — ABNORMAL HIGH (ref 70–99)

## 2023-11-10 MED ORDER — POTASSIUM CHLORIDE CRYS ER 20 MEQ PO TBCR
40.0000 meq | EXTENDED_RELEASE_TABLET | Freq: Once | ORAL | Status: AC
  Administered 2023-11-10: 40 meq via ORAL
  Filled 2023-11-10: qty 2

## 2023-11-10 MED ORDER — LIVING WELL WITH DIABETES BOOK
Freq: Once | Status: AC
  Filled 2023-11-10: qty 1

## 2023-11-10 MED ORDER — BACLOFEN 10 MG PO TABS
10.0000 mg | ORAL_TABLET | Freq: Every day | ORAL | Status: DC
  Administered 2023-11-11: 10 mg via ORAL
  Filled 2023-11-10 (×2): qty 1

## 2023-11-10 MED ORDER — METOPROLOL SUCCINATE ER 25 MG PO TB24
12.5000 mg | ORAL_TABLET | Freq: Every day | ORAL | Status: DC
  Administered 2023-11-10 – 2023-11-11 (×2): 12.5 mg via ORAL
  Filled 2023-11-10: qty 0.5
  Filled 2023-11-10: qty 1

## 2023-11-10 NOTE — Evaluation (Signed)
 Physical Therapy Evaluation and Discharge  Patient Details Name: Henry Anderson MRN: [id number] DOB: 1965/07/26 Today's Date: 11/10/2023  History of Present Illness  Patient is a 58 year old male with chest pain, code STEMI was activated in the field and patient was taken to cath lab emergently and received 2 stents. PMH:  HTN, HLD, DM, COPD, stroke, GERD, neuropathy, tobacco abuse.  Clinical Impression  Patient is agreeable to PT evaluation. He is independent at baseline and lives with spouse and multiple family members.  Today the patient is Mod I for all activity. He walked a lap around the nursing station with no external support required, no shortness of breath or chest pain reported. Vitals stable on room air. Education on progressing activity slowly at home and monitoring for signs of fatigue and need for rest breaks. No apparent acute PT needs at this time. Will sign off.       If plan is discharge home, recommend the following: Assist for transportation   Can travel by private vehicle        Equipment Recommendations None recommended by PT  Recommendations for Other Services       Functional Status Assessment Patient has not had a recent decline in their functional status     Precautions / Restrictions Precautions Precautions: Other (comment) (low fall risk) Recall of Precautions/Restrictions: Intact Restrictions Weight Bearing Restrictions Per Provider Order: No      Mobility  Bed Mobility Overal bed mobility: Modified Independent                  Transfers Overall transfer level: Modified independent                 General transfer comment: mild dizziness with standing initially. reinforced safety precuation to monitor for dizziness to stop before mobilizing for safety    Ambulation/Gait Ambulation/Gait assistance: Modified independent (Device/Increase time) Gait Distance (Feet): 160 Feet Assistive device: None Gait Pattern/deviations:  Step-through pattern, Decreased step length - left Gait velocity: decreased     General Gait Details: patient walked a lap in the hallway without loss of balance, no dizziness, no external support required  Stairs            Wheelchair Mobility     Tilt Bed    Modified Rankin (Stroke Patients Only)       Balance Overall balance assessment: No apparent balance deficits (not formally assessed)                                           Pertinent Vitals/Pain Pain Assessment Pain Assessment: No/denies pain    Home Living Family/patient expects to be discharged to:: Private residence Living Arrangements: Spouse/significant other;Children;Other relatives (son, grandson) Available Help at Discharge: Family;Available 24 hours/day Type of Home: House Home Access: Stairs to enter   Entrance Stairs-Number of Steps: 2 Alternate Level Stairs-Number of Steps: flight Home Layout: Two level;Able to live on main level with bedroom/bathroom Home Equipment: [place] - single point      Prior Function Prior Level of Function : Independent/Modified Independent                     Extremity/Trunk Assessment   Upper Extremity Assessment Upper Extremity Assessment: Overall WFL for tasks assessed    Lower Extremity Assessment Lower Extremity Assessment: Overall WFL for tasks assessed  Communication   Communication Communication: No apparent difficulties    Cognition Arousal: Alert Behavior During Therapy: WFL for tasks assessed/performed   PT - Cognitive impairments: No apparent impairments                         Following commands: Intact       Cueing Cueing Techniques: Verbal cues     General Comments General comments (skin integrity, edema, etc.): heart rate 69-71bpm, blood pressure 115/75 after mobilizing. Sp02 96% on room air. education on progressing activity slowly, short distance walking for conditioning. could likely  benefit from cardiac rehab when cleared by MD    Exercises     Assessment/Plan    PT Assessment Patient does not need any further PT services  PT Problem List         PT Treatment Interventions      PT Goals (Current goals can be found in the Care Plan section)  Acute Rehab PT Goals PT Goal Formulation: All assessment and education complete, DC therapy    Frequency       Co-evaluation               AM-PAC PT 6 Clicks Mobility  Outcome Measure Help needed turning from your back to your side while in a flat bed without using bedrails?: None Help needed moving from lying on your back to sitting on the side of a flat bed without using bedrails?: None Help needed moving to and from a bed to a chair (including a wheelchair)?: None Help needed standing up from a chair using your arms (e.g., wheelchair or bedside chair)?: None Help needed to walk in hospital room?: None Help needed climbing 3-5 steps with a railing? : None 6 Click Score: 24    End of Session   Activity Tolerance: Patient tolerated treatment well Patient left: in chair;with call bell/phone within reach;with family/visitor present (spouse and hospital chaplin present) Nurse Communication: Mobility status PT Visit Diagnosis: Difficulty in walking, not elsewhere classified (R26.2)    Time: 8982-8955 PT Time Calculation (min) (ACUTE ONLY): 27 min   Charges:   PT Evaluation $PT Eval Moderate Complexity: 1 Mod PT Treatments $Therapeutic Activity: 8-22 mins PT General Charges $$ ACUTE PT VISIT: 1 Visit         Randine Essex, PT, MPT   Randine LULLA Essex 11/10/2023, 11:02 AM

## 2023-11-10 NOTE — Progress Notes (Signed)
 [facility]  - [place] [facility]   PATIENT NAME: Henry Anderson    MR#:  [id number]  DATE OF BIRTH:  08-Aug-1965  SUBJECTIVE:  wife at bedside. Patient overall doing well. Denies any chest pain. Blood pressure soft however tolerating it. Denies dizziness or shortness of breath. Eager to ambulate and get out of bed today.    VITALS:  Blood pressure 116/72, pulse 68, temperature 98.3 F (36.8 C), temperature source Oral, resp. rate (!) 23, height 5' 9 (1.753 m), weight 81.6 kg, SpO2 95%.  PHYSICAL EXAMINATION:   GENERAL:  58 y.o.-year-old patient with no acute distress. Obese LUNGS: Normal breath sounds bilaterally, no wheezing CARDIOVASCULAR: S1, S2 normal. No murmur   ABDOMEN: Soft, nontender, nondistended. Bowel sounds present.  EXTREMITIES: No  edema b/l.    NEUROLOGIC: nonfocal  patient is alert and awake SKIN: No obvious rash, lesion, or ulcer.   LABORATORY PANEL:  CBC Recent Labs  Lab 11/10/23 0535  WBC 12.7*  HGB 12.1*  HCT 35.8*  PLT 203    Chemistries  Recent Labs  Lab 11/10/23 0535  NA 140  K 3.2*  CL 108  CO2 23  GLUCOSE 131*  BUN 11  CREATININE 0.57*  CALCIUM  8.1*  AST 131*  ALT 40  ALKPHOS 67  BILITOT 0.6   Cardiac Enzymes No results for input(s): TROPONINI in the last 168 hours. RADIOLOGY:  ECHOCARDIOGRAM COMPLETE Result Date: 11/09/2023    ECHOCARDIOGRAM REPORT   Patient Name:   Henry Anderson [facility] Date of Exam: 11/09/2023 Medical Rec #:  [id number]      Height:       69.0 in Accession #:    [id number]     Weight:       179.9 lb Date of Birth:  June 11, 1965     BSA:          1.975 m Patient Age:    58 years       BP:           88/47 mmHg Patient Gender: M              HR:           72 bpm. Exam Location:  [facility] Procedure: 2D Echo, Cardiac Doppler, Color Doppler and Intracardiac            Opacification Agent (Both Spectral and Color Flow Doppler were            utilized during procedure). Indications:     Acute myocardial  infarction, unspecified I21.9  History:         Patient has prior history of Echocardiogram examinations, most                  recent 10/13/2020. Acute MI.  Sonographer:     Ashley McNeely-Sloane Referring Phys:  [id number] XILIN NIU Diagnosing Phys: Sabina Custovic IMPRESSIONS  1. Left ventricular ejection fraction, by estimation, is 60 to 65%. The left ventricle has normal function. The left ventricle has no regional wall motion abnormalities. Left ventricular diastolic parameters were normal.  2. Right ventricular systolic function is normal. The right ventricular size is normal.  3. The mitral valve is normal in structure. No evidence of mitral valve regurgitation. No evidence of mitral stenosis.  4. The aortic valve is normal in structure. Aortic valve regurgitation is not visualized. No aortic stenosis is present.  5. The inferior vena cava is normal in size with greater than 50% respiratory variability, suggesting right  atrial pressure of 3 mmHg. FINDINGS  Left Ventricle: Left ventricular ejection fraction, by estimation, is 60 to 65%. The left ventricle has normal function. The left ventricle has no regional wall motion abnormalities. Definity contrast agent was given IV to delineate the left ventricular  endocardial borders. The left ventricular internal cavity size was normal in size. There is no left ventricular hypertrophy. Left ventricular diastolic parameters were normal. Right Ventricle: The right ventricular size is normal. No increase in right ventricular wall thickness. Right ventricular systolic function is normal. Left Atrium: Left atrial size was normal in size. Right Atrium: Right atrial size was normal in size. Pericardium: There is no evidence of pericardial effusion. Mitral Valve: The mitral valve is normal in structure. No evidence of mitral valve regurgitation. No evidence of mitral valve stenosis. MV peak gradient, 2.4 mmHg. The mean mitral valve gradient is 1.0 mmHg. Tricuspid Valve: The  tricuspid valve is normal in structure. Tricuspid valve regurgitation is trivial. Aortic Valve: The aortic valve is normal in structure. Aortic valve regurgitation is not visualized. No aortic stenosis is present. Aortic valve mean gradient measures 2.0 mmHg. Aortic valve peak gradient measures 4.6 mmHg. Aortic valve area, by VTI measures 2.88 cm. Pulmonic Valve: The pulmonic valve was normal in structure. Pulmonic valve regurgitation is not visualized. Aorta: The aortic root is normal in size and structure. Venous: The inferior vena cava is normal in size with greater than 50% respiratory variability, suggesting right atrial pressure of 3 mmHg. IAS/Shunts: No atrial level shunt detected by color flow Doppler.  LEFT VENTRICLE PLAX 2D LVIDd:         3.82 cm     Diastology LVIDs:         2.70 cm     LV e' medial:    3.19 cm/s LV PW:         1.29 cm     LV E/e' medial:  21.6 LV IVS:        0.94 cm     LV e' lateral:   11.20 cm/s LVOT diam:     1.90 cm     LV E/e' lateral: 6.2 LV SV:         51 LV SV Index:   26 LVOT Area:     2.84 cm  LV Volumes (MOD) LV vol d, MOD A2C: 92.1 ml LV vol d, MOD A4C: 94.2 ml LV vol s, MOD A2C: 40.0 ml LV vol s, MOD A4C: 32.2 ml LV SV MOD A2C:     52.2 ml LV SV MOD A4C:     94.2 ml LV SV MOD BP:      57.2 ml RIGHT VENTRICLE             IVC RV Basal diam:  3.43 cm     IVC diam: 1.84 cm RV Mid diam:    1.82 cm RV S prime:     10.70 cm/s TAPSE (M-mode): 1.9 cm LEFT ATRIUM             Index        RIGHT ATRIUM           Index LA diam:        3.00 cm 1.52 cm/m   RA Area:     11.60 cm LA Vol (A2C):   36.6 ml 18.53 ml/m  RA Volume:   25.90 ml  13.11 ml/m LA Vol (A4C):   17.8 ml 9.01 ml/m LA Biplane Vol: 25.3 ml 12.81 ml/m  AORTIC VALVE                    PULMONIC VALVE AV Area (Vmax):    2.53 cm     PV Vmax:        0.77 m/s AV Area (Vmean):   2.24 cm     PV Vmean:       63.600 cm/s AV Area (VTI):     2.88 cm     PV VTI:         0.165 m AV Vmax:           107.00 cm/s  PV Peak grad:   2.4  mmHg AV Vmean:          70.800 cm/s  PV Mean grad:   2.0 mmHg AV VTI:            0.178 m      RVOT Peak grad: 2 mmHg AV Peak Grad:      4.6 mmHg AV Mean Grad:      2.0 mmHg LVOT Vmax:         95.30 cm/s LVOT Vmean:        55.900 cm/s LVOT VTI:          0.181 m LVOT/AV VTI ratio: 1.02  AORTA Ao Root diam: 3.30 cm Ao Asc diam:  2.60 cm MITRAL VALVE MV Area (PHT): 2.93 cm    SHUNTS MV Area VTI:   2.06 cm    Systemic VTI:  0.18 m MV Peak grad:  2.4 mmHg    Systemic Diam: 1.90 cm MV Mean grad:  1.0 mmHg    Pulmonic VTI:  0.138 m MV Vmax:       0.77 m/s MV Vmean:      49.8 cm/s MV Decel Time: 259 msec MV E velocity: 68.90 cm/s MV A velocity: 81.00 cm/s MV E/A ratio:  0.85 Sabina Custovic Electronically signed by Annalee Casa Signature Date/Time: 11/09/2023/11:14:44 AM    Final    DG Chest Port 1 View Result Date: 11/08/2023 EXAM: 1 VIEW(S) XRAY OF THE CHEST 11/08/2023 09:18:00 PM COMPARISON: Chest x-ray 02/09/2022, CT chest 10/05/2023. CLINICAL HISTORY: 355200 Chest pain [id number]. Patient in ICU after cardiac cath due to code STEMI today. Stated 8/10 chest pain and diaphoresis to EMS. Hx of diabetes, HTN, stroke, emphysema of lung, and current smoker. FINDINGS: LUNGS AND PLEURA: No focal pulmonary opacity. No pulmonary edema. No pleural effusion. No pneumothorax. HEART AND MEDIASTINUM: No acute abnormality of the cardiac and mediastinal silhouettes. BONES AND SOFT TISSUES: No acute osseous abnormality. IMPRESSION: 1. No acute cardiopulmonary process. Electronically signed by: Morgane Naveau MD 11/08/2023 09:24 PM EDT RP Workstation: [id number]   CARDIAC CATHETERIZATION Result Date: 11/08/2023   Prox RCA lesion is 100% stenosed.   Mid RCA lesion is 90% stenosed.   RPAV lesion is 100% stenosed.   A drug-eluting stent was successfully placed using a STENT ONYX FRONTIER 3.5X18.   A drug-eluting stent was successfully placed using a STENT ONYX FRONTIER 3.0X30.   using a BALLOON MINITREK RX 2.0X12.   Post  intervention, there is a 0% residual stenosis.   Post intervention, there is a 0% residual stenosis.   Post intervention, there is a 100% residual stenosis.   The left ventricular systolic function is normal.   LV end diastolic pressure is normal.   The left ventricular ejection fraction is 50-55% by visual estimate. 1.  Inferior STEMI 2.  Occluded proximal RCA 3.  Overall normal left  ventricular ejection fraction 4.  Successful primary PCI with overlapping 3.5 x 18 mm and 3.0 x 30 mm Onyx frontier drug-eluting stents proximal and mid RCA Recommendations 1.  Dual antiplatelet therapy uninterrupted x 1 year 2.  Start high intensity atorvastatin  3.  Start metoprolol succinate 25 mg daily 4.  2D echocardiogram   Assessment and Plan  LENVILLE HIBBERD is a 58 y.o. male with medical history significant for insulin -dependent diabetes mellitus, hypertension, hyperlipidemia, diabetes mellitus, COPD, GERD, neuropathy, stroke, tobacco use disorder, who presented to the hospital with severe substernal chest pain that radiated to the left arm.  This was associated with shortness of breath and diaphoresis.   EKG showed inferior wall ST elevation.  Initial troponin was normal but repeat troponin was up to 18,962.  He underwent emergent left heart catheterization with PCI and stent placement to occluded proximal and mid RCA by Dr Carnella  Acute inferior STEMI: S/p PCI with stent placement to occluded proximal and mid RCA on 11/08/2023.   --Continue uninterrupted dual antiplatelet therapy with low-dose aspirin  and Plavix . --2D echo showed EF estimated at 60 to 65%, normal LV diastolic parameters --start low dose BB today --cont statins --cardiac rehab    Dyslipidemia: Continue high-dose Lipitor Lipid panel showed total cholesterol 217, triglycerides 650, HDL 33, unable to calculate LDL    Hypotension: He required IV fluids including normal saline bolus for hypotension.  --MAP stable. Starte don po BB per  cards  Lactic acidosis: Improved from 2-1.7.  Resolved probably from hypotension.   Type II DM with hyperglycemia: Continue Lantus  (increased dose from 40 to 45 units) daily.  NovoLog  as needed for hyperglycemia. --Hemoglobin A1c 9.1. --He said he takes Basaglar  45 units nightly and sliding scale Humalog  but he barely uses Humalog  because he said glucose levels are usually in the 150s.    Tobacco use disorder: He has been counseled to stop smoking cigarettes given CAD and history of stroke.   Ambulated well with physical therapy. Will transfer out of ICU.  Procedures: cardiac cath Family communication : wife Consults : [facility] cardiology CODE STATUS: full DVT Prophylaxis : Lovenox  Level of care: Progressive Status is: Inpatient Remains inpatient appropriate because: cardiology would like to monitor blood pressure one more day after starting low-dose beta-blocker    TOTAL TIME TAKING CARE OF THIS PATIENT: 35 minutes.  >50% time spent on counselling and coordination of care  Note: This dictation was prepared with Dragon dictation along with smaller phrase technology. Any transcriptional errors that result from this process are unintentional.  Leita Blanch M.D    [facility]   CC: Primary care physician; Fernand Fredy RAMAN, MD

## 2023-11-10 NOTE — Progress Notes (Signed)
   11/10/23 0945  Spiritual Encounters  Type of Visit Initial  Care provided to: Family (Spoke w/Wife in hallway; Wife stated she was super grateful for Chaplain Melissa during Pt's STEMI!)  Referral source Chaplain assessment  Reason for visit Routine spiritual support  OnCall Visit Yes  Spiritual Framework  Family Stress Factors Exhausted;Health changes  Interventions  Spiritual Care Interventions Made Compassionate presence;Reflective listening  Intervention Outcomes  Outcomes Connection to spiritual care;Awareness of support

## 2023-11-10 NOTE — Progress Notes (Addendum)
 1607 Report called to Olu RN on 2A 1630 Transferred to [phone] via wheelchair. Wife notified by husband.

## 2023-11-10 NOTE — Progress Notes (Signed)
   11/10/23 1045  Spiritual Encounters  Type of Visit Initial  Care provided to: Pt and family (Wife at bedside)  Conversation partners present during encounter Other (comment) (PT)  Referral source Chaplain assessment  Reason for visit Routine spiritual support  OnCall Visit Yes  Spiritual Framework  Presenting Themes Meaning/purpose/sources of inspiration;Goals in life/care;Significant life change;Impactful experiences and emotions;Other (comment) (Pt and Wife LOVE spending time their grandson!)  Family Stress Factors Exhausted;Health changes;Other (Comment) (Wife has some back issues that cause pain)  Interventions  Spiritual Care Interventions Made Established relationship of care and support;Compassionate presence;Reflective listening;Normalization of emotions;Self-care teaching;Encouragement  Intervention Outcomes  Outcomes Connection to spiritual care;Awareness around self/spiritual resourses;Connection to values and goals of care;Awareness of support;Awareness of health

## 2023-11-10 NOTE — Care Management Important Message (Signed)
 Important Message  Patient Details  Name: Henry Anderson MRN: [id number] Date of Birth: Feb 03, 1965   Important Message Given:  Yes - Medicare IM     Henry Anderson 11/10/2023, 2:47 PM

## 2023-11-10 NOTE — Progress Notes (Signed)
 [facility] CLINIC CARDIOLOGY PROGRESS NOTE   Patient ID: Henry Anderson MRN: [id number] DOB/AGE: 11-Oct-1965 58 y.o.  Admit date: 11/08/2023 Referring Physician None - STEMI pt Primary Physician Fernand Fredy RAMAN, MD  Primary Cardiologist Dr. Fernand Reason for Consultation STEMI  HPI: Henry Anderson is a 58 y.o. male with a past medical history of essential hypertension, hyperlipidemia, prior CVA, type 2 diabetes who presented to the ED on 11/08/2023 for chest pain. Code STEMI was activated in the field and patient was taken to cath lab emergently.   Interval History:  -Patient seen and examined this AM, resting in bed with wife at bedside.  -Feeling better overall, states SOB and CP improving. -BP overall improved but still borderline, he is asymptomatic with this. Encouraged PO intake.  -HR stable, no concerning events on tele.  -Reviewed echo results with patient and wife.  Review of systems complete and found to be negative unless listed above   Vitals:   11/10/23 0600 11/10/23 0630 11/10/23 0700 11/10/23 0800  BP: 118/72 113/69 104/63   Pulse: 72 68 65 70  Resp: 15 (!) 27 (!) 21 19  Temp:      TempSrc:      SpO2: 95% 93% 94%   Weight:      Height:         Intake/Output Summary (Last 24 hours) at 11/10/2023 0803 Last data filed at 11/10/2023 0600 Gross per 24 hour  Intake 1722.94 ml  Output 1000 ml  Net 722.94 ml     PHYSICAL EXAM General: Well appearing male, well nourished, in no acute distress. HEENT: Normocephalic and atraumatic. Neck: No JVD.  Lungs: Normal respiratory effort on room air. Clear bilaterally to auscultation. No wheezes, crackles, rhonchi.  Heart: HRRR. Normal S1 and S2 without gallops or murmurs. Radial & DP pulses 2+ bilaterally. Abdomen: Non-distended appearing.  Msk: Normal strength and tone for age. Extremities: No clubbing, cyanosis or edema.   Neuro: Alert and oriented X 3. Psych: Mood appropriate, affect congruent.    LABS: Basic  Metabolic Panel: Recent Labs    11/09/23 0515 11/10/23 0535  NA 139 140  K 3.9 3.2*  CL 104 108  CO2 25 23  GLUCOSE 272* 131*  BUN 13 11  CREATININE 0.87 0.57*  CALCIUM  8.4* 8.1*   Liver Function Tests: Recent Labs    11/08/23 1844 11/10/23 0535  AST 25 131*  ALT 26 40  ALKPHOS 89 67  BILITOT 0.8 0.6  PROT 7.7 5.9*  ALBUMIN 4.2 2.9*   No results for input(s): LIPASE, AMYLASE in the last 72 hours. CBC: Recent Labs    11/08/23 1844 11/09/23 0515 11/10/23 0535  WBC 15.0* 13.9* 12.7*  NEUTROABS 10.1*  --   --   HGB 16.2 13.5 12.1*  HCT 47.5 40.7 35.8*  MCV 85.4 86.2 86.7  PLT 306 262 203   Cardiac Enzymes: Recent Labs    11/08/23 1844 11/08/23 2122  TROPONINIHS 2 18,962*   BNP: No results for input(s): BNP in the last 72 hours. D-Dimer: No results for input(s): DDIMER in the last 72 hours. Hemoglobin A1C: Recent Labs    11/08/23 1844  HGBA1C 9.7*   Fasting Lipid Panel: Recent Labs    11/08/23 1844  CHOL 217*  HDL 33*  LDLCALC UNABLE TO CALCULATE IF TRIGLYCERIDE OVER 400 mg/dL  TRIG 349*  CHOLHDL 6.6   Thyroid Function Tests: No results for input(s): TSH, T4TOTAL, T3FREE, THYROIDAB in the last 72 hours.  Invalid input(s): FREET3  Anemia Panel: No results for input(s): VITAMINB12, FOLATE, FERRITIN, TIBC, IRON, RETICCTPCT in the last 72 hours.  ECHOCARDIOGRAM COMPLETE Result Date: 11/09/2023    ECHOCARDIOGRAM REPORT   Patient Name:   Henry Anderson [facility] Date of Exam: 11/09/2023 Medical Rec #:  [id number]      Height:       69.0 in Accession #:    [id number]     Weight:       179.9 lb Date of Birth:  March 09, 1965     BSA:          1.975 m Patient Age:    58 years       BP:           88/47 mmHg Patient Gender: M              HR:           72 bpm. Exam Location:  [facility] Procedure: 2D Echo, Cardiac Doppler, Color Doppler and Intracardiac            Opacification Agent (Both Spectral and Color Flow Doppler were            utilized  during procedure). Indications:     Acute myocardial infarction, unspecified I21.9  History:         Patient has prior history of Echocardiogram examinations, most                  recent 10/13/2020. Acute MI.  Sonographer:     Ashley McNeely-Sloane Referring Phys:  [id number] XILIN NIU Diagnosing Phys: Sabina Custovic IMPRESSIONS  1. Left ventricular ejection fraction, by estimation, is 60 to 65%. The left ventricle has normal function. The left ventricle has no regional wall motion abnormalities. Left ventricular diastolic parameters were normal.  2. Right ventricular systolic function is normal. The right ventricular size is normal.  3. The mitral valve is normal in structure. No evidence of mitral valve regurgitation. No evidence of mitral stenosis.  4. The aortic valve is normal in structure. Aortic valve regurgitation is not visualized. No aortic stenosis is present.  5. The inferior vena cava is normal in size with greater than 50% respiratory variability, suggesting right atrial pressure of 3 mmHg. FINDINGS  Left Ventricle: Left ventricular ejection fraction, by estimation, is 60 to 65%. The left ventricle has normal function. The left ventricle has no regional wall motion abnormalities. Definity contrast agent was given IV to delineate the left ventricular  endocardial borders. The left ventricular internal cavity size was normal in size. There is no left ventricular hypertrophy. Left ventricular diastolic parameters were normal. Right Ventricle: The right ventricular size is normal. No increase in right ventricular wall thickness. Right ventricular systolic function is normal. Left Atrium: Left atrial size was normal in size. Right Atrium: Right atrial size was normal in size. Pericardium: There is no evidence of pericardial effusion. Mitral Valve: The mitral valve is normal in structure. No evidence of mitral valve regurgitation. No evidence of mitral valve stenosis. MV peak gradient, 2.4 mmHg. The mean mitral  valve gradient is 1.0 mmHg. Tricuspid Valve: The tricuspid valve is normal in structure. Tricuspid valve regurgitation is trivial. Aortic Valve: The aortic valve is normal in structure. Aortic valve regurgitation is not visualized. No aortic stenosis is present. Aortic valve mean gradient measures 2.0 mmHg. Aortic valve peak gradient measures 4.6 mmHg. Aortic valve area, by VTI measures 2.88 cm. Pulmonic Valve: The pulmonic valve was normal in structure. Pulmonic valve regurgitation is not visualized. Aorta:  The aortic root is normal in size and structure. Venous: The inferior vena cava is normal in size with greater than 50% respiratory variability, suggesting right atrial pressure of 3 mmHg. IAS/Shunts: No atrial level shunt detected by color flow Doppler.  LEFT VENTRICLE PLAX 2D LVIDd:         3.82 cm     Diastology LVIDs:         2.70 cm     LV e' medial:    3.19 cm/s LV PW:         1.29 cm     LV E/e' medial:  21.6 LV IVS:        0.94 cm     LV e' lateral:   11.20 cm/s LVOT diam:     1.90 cm     LV E/e' lateral: 6.2 LV SV:         51 LV SV Index:   26 LVOT Area:     2.84 cm  LV Volumes (MOD) LV vol d, MOD A2C: 92.1 ml LV vol d, MOD A4C: 94.2 ml LV vol s, MOD A2C: 40.0 ml LV vol s, MOD A4C: 32.2 ml LV SV MOD A2C:     52.2 ml LV SV MOD A4C:     94.2 ml LV SV MOD BP:      57.2 ml RIGHT VENTRICLE             IVC RV Basal diam:  3.43 cm     IVC diam: 1.84 cm RV Mid diam:    1.82 cm RV S prime:     10.70 cm/s TAPSE (M-Anderson): 1.9 cm LEFT ATRIUM             Index        RIGHT ATRIUM           Index LA diam:        3.00 cm 1.52 cm/m   RA Area:     11.60 cm LA Vol (A2C):   36.6 ml 18.53 ml/m  RA Volume:   25.90 ml  13.11 ml/m LA Vol (A4C):   17.8 ml 9.01 ml/m LA Biplane Vol: 25.3 ml 12.81 ml/m  AORTIC VALVE                    PULMONIC VALVE AV Area (Vmax):    2.53 cm     PV Vmax:        0.77 m/s AV Area (Vmean):   2.24 cm     PV Vmean:       63.600 cm/s AV Area (VTI):     2.88 cm     PV VTI:         0.165 m AV  Vmax:           107.00 cm/s  PV Peak grad:   2.4 mmHg AV Vmean:          70.800 cm/s  PV Mean grad:   2.0 mmHg AV VTI:            0.178 m      RVOT Peak grad: 2 mmHg AV Peak Grad:      4.6 mmHg AV Mean Grad:      2.0 mmHg LVOT Vmax:         95.30 cm/s LVOT Vmean:        55.900 cm/s LVOT VTI:          0.181 m LVOT/AV VTI ratio: 1.02  AORTA Ao  Root diam: 3.30 cm Ao Asc diam:  2.60 cm MITRAL VALVE MV Area (PHT): 2.93 cm    SHUNTS MV Area VTI:   2.06 cm    Systemic VTI:  0.18 m MV Peak grad:  2.4 mmHg    Systemic Diam: 1.90 cm MV Mean grad:  1.0 mmHg    Pulmonic VTI:  0.138 m MV Vmax:       0.77 m/s MV Vmean:      49.8 cm/s MV Decel Time: 259 msec MV E velocity: 68.90 cm/s MV A velocity: 81.00 cm/s MV E/A ratio:  0.85 Sabina Custovic Electronically signed by Annalee Casa Signature Date/Time: 11/09/2023/11:14:44 AM    Final    DG Chest Port 1 View Result Date: 11/08/2023 EXAM: 1 VIEW(S) XRAY OF THE CHEST 11/08/2023 09:18:00 PM COMPARISON: Chest x-ray 02/09/2022, CT chest 10/05/2023. CLINICAL HISTORY: 355200 Chest pain [id number]. Patient in ICU after cardiac cath due to code STEMI today. Stated 8/10 chest pain and diaphoresis to EMS. Hx of diabetes, HTN, stroke, emphysema of lung, and current smoker. FINDINGS: LUNGS AND PLEURA: No focal pulmonary opacity. No pulmonary edema. No pleural effusion. No pneumothorax. HEART AND MEDIASTINUM: No acute abnormality of the cardiac and mediastinal silhouettes. BONES AND SOFT TISSUES: No acute osseous abnormality. IMPRESSION: 1. No acute cardiopulmonary process. Electronically signed by: Morgane Naveau MD 11/08/2023 09:24 PM EDT RP Workstation: [id number]   CARDIAC CATHETERIZATION Result Date: 11/08/2023   Prox RCA lesion is 100% stenosed.   Mid RCA lesion is 90% stenosed.   RPAV lesion is 100% stenosed.   A drug-eluting stent was successfully placed using a STENT ONYX FRONTIER 3.5X18.   A drug-eluting stent was successfully placed using a STENT ONYX FRONTIER 3.0X30.   using  a BALLOON MINITREK RX 2.0X12.   Post intervention, there is a 0% residual stenosis.   Post intervention, there is a 0% residual stenosis.   Post intervention, there is a 100% residual stenosis.   The left ventricular systolic function is normal.   LV end diastolic pressure is normal.   The left ventricular ejection fraction is 50-55% by visual estimate. 1.  Inferior STEMI 2.  Occluded proximal RCA 3.  Overall normal left ventricular ejection fraction 4.  Successful primary PCI with overlapping 3.5 x 18 mm and 3.0 x 30 mm Onyx frontier drug-eluting stents proximal and mid RCA Recommendations 1.  Dual antiplatelet therapy uninterrupted x 1 year 2.  Start high intensity atorvastatin  3.  Start metoprolol succinate 25 mg daily 4.  2D echocardiogram    ECHO as above  TELEMETRY (personally reviewed): sinus rhythm rate 60s  EKG (personally reviewed): NSR rate 85 bpm with inferior STE, reciprocal ST depression  DATA reviewed by me 11/10/23: last 24h vitals tele labs imaging I/O, hospitalist progress note  Principal Problem:   STEMI involving oth coronary artery of inferior wall (HCC) Active Problems:   Poorly controlled type 2 diabetes mellitus with peripheral neuropathy (HCC)   Emphysema of lung (HCC)   CVA (cerebrovascular accident) (HCC)   Nicotine  abuse   Mixed hyperlipidemia   Overweight (BMI 25.0-29.9)   Leukocytosis   HTN (hypertension)    ASSESSMENT AND PLAN: Henry Anderson is a 58 y.o. male with a past medical history of essential hypertension, hyperlipidemia, prior CVA, type 2 diabetes who presented to the ED on 11/08/2023 for chest pain. Code STEMI was activated in the field and patient was taken to cath lab emergently.   # STEMI - RCA # Coronary artery disease # Hypertension #  Hyperlipidemia # Type II diabetes # Hx CVA Patient presented to the ED after onset of CP at home, code STEMI activated in the field. EKG with NSR rate 85 bpm with inferior STE, reciprocal ST depression.  Trops 2 > 18,962. Taken emergently to the cath lab and received two stents to RCA as above in cath report. Lipid panel with TC 217, HDL 33, Trig 650. A1c 9.7. Echo this admission with EF 60-65%, no WMAs, normal diastolic function, normal RV side and function. -Direct LDL pending. Continue atorvastatin  80 mg daily.  -Continue aspirin  81 mg daily and plavix  75 mg daily.  -Will start low dose metoprolol succinate 12.5 mg today.   Anticipate readiness for DC home tomorrow.   This patient's case was discussed and created with Dr. Ammon and he is in agreement.  Signed:  Danita Bloch, PA-C  11/10/2023, 8:03 AM [facility] Cardiology

## 2023-11-10 NOTE — Inpatient Diabetes Management (Addendum)
 Inpatient Diabetes Program Recommendations  AACE/ADA: New Consensus Statement on Inpatient Glycemic Control   Target Ranges:  Prepandial:   less than 140 mg/dL      Peak postprandial:   less than 180 mg/dL (1-2 hours)      Critically ill patients:  140 - 180 mg/dL   Lab Results  Component Value Date   GLUCAP 165 (H) 11/09/2023   HGBA1C 9.7 (H) 11/08/2023    Latest Reference Range & Units 07/27/23 12:13 11/08/23 18:44  Hemoglobin A1C 4.8 - 5.6 % 7.7 (H) 9.7 (H)  (H): Data is abnormally high  Latest Reference Range & Units 11/08/23 20:24 11/08/23 23:57 11/09/23 07:40 11/09/23 11:02 11/09/23 16:20 11/09/23 21:36  Glucose-Capillary 70 - 99 mg/dL 617 (H) 748 (H) 742 (H) 229 (H) 183 (H) 165 (H)  (H): Data is abnormally high  Diabetes history: DM2 Outpatient Diabetes medications: Basaglar  45 units daily, Humalog  3-12 units TID with meals (does not take if CBG less than 150 mg/dl), Metformin  XR 1000 QAM, Ozempic  2 mg Qweek, Farxiga  10 mg daily (not taking) Current orders for Inpatient glycemic control: Semglee  40 units at bedtime, Novolog  0-9 units TID with meals, Novolog  0-5 units QHS  Inpatient Diabetes Program Recommendations:   Noted A1c increase from 7.7 07/27/23 to currently 9.7 (average blood glucose 232 over the past 2-3 months). Spoke with patient and spouse and reviewed the increase in A1c and risks of elevated CBGs. Patient does not always remember to take even his basal insulin  but acknowledges need to start. Patient would like to try a CGM. Dexcom G7 is compatible with patient's phone and has $0 copay. Please order on discharge- [id number] Dexcom G7 sensors.  MD ordered application of Dexcom G7 at discharge for patient. Education done regarding application and changing CGM sensor (alternate every 10 days on back of arms), 1 hour warm-up, use of glucometer when alert displays, how to scan CGM for glucose reading and information for PCP. Patient has also been given educational packet  regarding use CGM sensor including the 1-800 toll free number for any questions, problems or needs related to the [facility] sensors or reader. Sensor applied to patient to (R) Arm at 1:10 pm.  Explained that glucose readings will not be available until 26 minutes after application.  Patient very appreciative.   Thank you, Mazi Brailsford E. Jamoni Hewes, RN, MSN, CNS, CDCES  Diabetes Coordinator Inpatient Glycemic Control Team Team Pager [phone] (8am-5pm) 11/10/2023 1:14 PM

## 2023-11-10 NOTE — Telephone Encounter (Signed)
 Pharmacy Patient Advocate Encounter  Insurance verification completed.    The patient is insured through [employer] and [place] Medicaid.  Ran test claim for Dexcom G7 Sensors and the current 30 day co-pay is $0.   This test claim was processed through [employer]- copay amounts may vary at other pharmacies due to [employer], or as the patient moves through the different stages of their insurance plan.

## 2023-11-11 ENCOUNTER — Other Ambulatory Visit: Payer: Self-pay

## 2023-11-11 DIAGNOSIS — E782 Mixed hyperlipidemia: Secondary | ICD-10-CM | POA: Diagnosis not present

## 2023-11-11 DIAGNOSIS — E1142 Type 2 diabetes mellitus with diabetic polyneuropathy: Secondary | ICD-10-CM | POA: Diagnosis not present

## 2023-11-11 DIAGNOSIS — I2119 ST elevation (STEMI) myocardial infarction involving other coronary artery of inferior wall: Secondary | ICD-10-CM | POA: Diagnosis not present

## 2023-11-11 DIAGNOSIS — I10 Essential (primary) hypertension: Secondary | ICD-10-CM | POA: Diagnosis not present

## 2023-11-11 LAB — BASIC METABOLIC PANEL WITH GFR
Anion gap: 10 (ref 5–15)
BUN: 11 mg/dL (ref 6–20)
CO2: 23 mmol/L (ref 22–32)
Calcium: 8.2 mg/dL — ABNORMAL LOW (ref 8.9–10.3)
Chloride: 105 mmol/L (ref 98–111)
Creatinine, Ser: 0.69 mg/dL (ref 0.61–1.24)
GFR, Estimated: >60 mL/min (ref >60)
Glucose, Bld: 164 mg/dL — ABNORMAL HIGH (ref 70–99)
Potassium: 2.9 mmol/L — ABNORMAL LOW (ref 3.5–5.1)
Sodium: 138 mmol/L (ref 135–145)

## 2023-11-11 LAB — GLUCOSE, CAPILLARY: Glucose-Capillary: 153 mg/dL — ABNORMAL HIGH (ref 70–99)

## 2023-11-11 LAB — CBC
HCT: 35.2 % — ABNORMAL LOW (ref 39.0–52.0)
Hemoglobin: 11.8 g/dL — ABNORMAL LOW (ref 13.0–17.0)
MCH: 28.9 pg (ref 26.0–34.0)
MCHC: 33.5 g/dL (ref 30.0–36.0)
MCV: 86.3 fL (ref 80.0–100.0)
Platelets: 192 K/uL (ref 150–400)
RBC: 4.08 MIL/uL — ABNORMAL LOW (ref 4.22–5.81)
RDW: 12.7 % (ref 11.5–15.5)
WBC: 11.4 K/uL — ABNORMAL HIGH (ref 4.0–10.5)
nRBC: 0 % (ref 0.0–0.2)

## 2023-11-11 LAB — LIPOPROTEIN A (LPA): Lipoprotein (a): <8.4 nmol/L (ref <75.0)

## 2023-11-11 MED ORDER — ASPIRIN 81 MG PO TBEC
81.0000 mg | DELAYED_RELEASE_TABLET | Freq: Every day | ORAL | 3 refills | Status: AC
  Filled 2023-11-11: qty 90, 90d supply, fill #0

## 2023-11-11 MED ORDER — ATORVASTATIN CALCIUM 80 MG PO TABS
80.0000 mg | ORAL_TABLET | Freq: Every day | ORAL | 4 refills | Status: AC
  Filled 2023-11-11: qty 90, 90d supply, fill #0

## 2023-11-11 MED ORDER — DEXCOM G7 SENSOR MISC
1.0000 | 2 refills | Status: DC
  Filled 2023-11-11: qty 3, 30d supply, fill #0

## 2023-11-11 MED ORDER — NICOTINE 21 MG/24HR TD PT24
21.0000 mg | MEDICATED_PATCH | Freq: Every day | TRANSDERMAL | 0 refills | Status: AC
  Filled 2023-11-11: qty 28, 28d supply, fill #0

## 2023-11-11 MED ORDER — METOPROLOL SUCCINATE ER 25 MG PO TB24
12.5000 mg | ORAL_TABLET | Freq: Every day | ORAL | 3 refills | Status: AC
  Filled 2023-11-11: qty 45, 90d supply, fill #0
  Filled 2024-02-16: qty 45, 90d supply, fill #1

## 2023-11-11 MED ORDER — NITROGLYCERIN 0.4 MG SL SUBL
0.4000 mg | SUBLINGUAL_TABLET | SUBLINGUAL | 12 refills | Status: AC | PRN
  Filled 2023-11-11: qty 25, 10d supply, fill #0

## 2023-11-11 MED ORDER — POTASSIUM CHLORIDE CRYS ER 20 MEQ PO TBCR
60.0000 meq | EXTENDED_RELEASE_TABLET | Freq: Once | ORAL | Status: AC
  Administered 2023-11-11: 60 meq via ORAL
  Filled 2023-11-11: qty 3

## 2023-11-11 MED ORDER — CLOPIDOGREL BISULFATE 75 MG PO TABS
75.0000 mg | ORAL_TABLET | Freq: Every day | ORAL | 4 refills | Status: AC
  Filled 2023-11-11: qty 90, 90d supply, fill #0

## 2023-11-11 NOTE — Discharge Summary (Signed)
 Physician Discharge Summary   Patient: Henry Anderson MRN: [id number] DOB: August 21, 1965  Admit date:     11/08/2023  Discharge date: 11/11/23  Discharge Physician: Leita Blanch   PCP: Fernand Fredy RAMAN, MD   Recommendations at discharge:    F/u Dr Fernand on Monday at 1 pm F/u PCP in 1-2 weeks  Discharge Diagnoses: Principal Problem:   STEMI involving oth coronary artery of inferior wall (HCC) Active Problems:   Leukocytosis   HTN (hypertension)   Mixed hyperlipidemia   Poorly controlled type 2 diabetes mellitus with peripheral neuropathy (HCC)   CVA (cerebrovascular accident) (HCC)   Emphysema of lung (HCC)   Nicotine  abuse   Overweight (BMI 25.0-29.9)  Henry Anderson is a 58 y.o. male with medical history significant for insulin -dependent diabetes mellitus, hypertension, hyperlipidemia, diabetes mellitus, COPD, GERD, neuropathy, stroke, tobacco use disorder, who presented to the hospital with severe substernal chest pain that radiated to the left arm.  This was associated with shortness of breath and diaphoresis.   EKG showed inferior wall ST elevation.  Initial troponin was normal but repeat troponin was up to 18,962.   He underwent emergent left heart catheterization with PCI and stent placement to occluded proximal and mid RCA by Dr Carnella   Acute inferior STEMI: S/p PCI with stent placement to occluded proximal and mid RCA on 11/08/2023.   --Continue uninterrupted dual antiplatelet therapy with low-dose aspirin  and Plavix . --2D echo showed EF estimated at 60 to 65%, normal LV diastolic parameters --tolerating start low dose BB. Further GDMT per cardiology as out pt --cont statins --cardiac rehab    Dyslipidemia: Continue high-dose Lipitor    Hypotension: He required IV fluids including normal saline bolus for hypotension.  --MAP stable. Started po BB per cards   Lactic acidosis: Improved from 2-1.7.  Resolved probably from hypotension.   Type II DM with hyperglycemia:  Continue Lantus  (increased dose from 40 to 45 units) daily.  NovoLog  as needed for hyperglycemia. --Hemoglobin A1c 9.1. --resume home dose insulin  and f/u PCP    Tobacco use disorder: He has been counseled to stop smoking cigarettes given CAD and history of stroke.   Ambulated well with physical therapy. Stable overall. D/c home. Pt and family agreeable   Procedures: cardiac cath Family communication : wife Consults : [facility] cardiology CODE STATUS: full DVT Prophylaxis : Lovenox       Disposition: Home Diet recommendation:  Cardiac and Carb modified diet DISCHARGE MEDICATION: Allergies as of 11/11/2023   No Known Allergies      Medication List     PAUSE taking these medications    lisinopril  20 MG tablet Wait to take this until your doctor or other care provider tells you to start again. Commonly known as: ZESTRIL  Take 1 tablet (20 mg total) by mouth once daily.       STOP taking these medications    amLODipine  5 MG tablet Commonly known as: NORVASC    Farxiga  10 MG Tabs tablet Generic drug: dapagliflozin  propanediol   mometasone -formoterol  100-5 MCG/ACT Aero Commonly known as: DULERA    naproxen sodium 220 MG tablet Commonly known as: ALEVE       TAKE these medications    Accu-Chek Guide Test test strip Generic drug: glucose blood Use to check blood glucose up to 4 times a day   Accu-Chek Guide w/Device Kit Use as directed   Accu-Chek Softclix Lancets lancets Use up to four times daily as directed to check blood sugar.   acetaminophen  325 MG  tablet Commonly known as: TYLENOL  Take 2 tablets (650 mg total) by mouth every 4 (four) hours as needed for mild pain (or temp > 37.5 C (99.5 F)).   atorvastatin  80 MG tablet Commonly known as: LIPITOR Take 1 tablet (80 mg total) by mouth daily.   baclofen  10 MG tablet Commonly known as: LIORESAL  Take 1 tablet (10 mg total) by mouth daily.   chlorthalidone  25 MG tablet Commonly known as: HYGROTON  Take 1  tablet (25 mg total) by mouth once daily.   clopidogrel  75 MG tablet Commonly known as: PLAVIX  Take 1 tablet (75 mg total) by mouth once daily.   Dexcom G7 Sensor Misc 1 Application by Does not apply route every 14 (fourteen) days.   Embecta Pen Needle Nano 2 Gen 32G X 4 MM Misc Generic drug: Insulin  Pen Needle Inject 1 Needle with Basaglar  as directed daily.   Unifine Pentips 31G X 6 MM Misc Generic drug: Insulin  Pen Needle 1 each by Does not apply route 2 (two) times daily as needed.   gabapentin  100 MG capsule Commonly known as: Neurontin  Take 1 capsule (100 mg total) by mouth at bedtime.   HumaLOG  KwikPen 200 UNIT/ML KwikPen Generic drug: insulin  lispro No insulin  if blood sugar less than 150, Inject 3 units if 151-200, inject 6 units if 201-250, inject 9 units if 251-300, inject 12 units if 301-350. Sliding scale with 12 units/day max   Lantus  SoloStar 100 UNIT/ML Solostar Pen Generic drug: insulin  glargine Inject 45 Units into the skin daily.   metFORMIN  500 MG 24 hr tablet Commonly known as: GLUCOPHAGE -XR Take 2 tablets (1,000 mg total) by mouth daily with breakfast.   metoprolol succinate 25 MG 24 hr tablet Commonly known as: TOPROL-XL Take 0.5 tablets (12.5 mg total) by mouth daily. Start taking on: November 12, 2023   nicotine  21 mg/24hr patch Commonly known as: NICODERM CQ  - dosed in mg/24 hours Place 1 patch (21 mg total) onto the skin daily. Start taking on: November 12, 2023   nitroGLYCERIN 0.4 MG SL tablet Commonly known as: NITROSTAT Place 1 tablet (0.4 mg total) under the tongue every 5 (five) minutes as needed for chest pain.   ondansetron  4 MG tablet Commonly known as: Zofran  Take 1 tablet (4 mg total) by mouth every 8 (eight) hours as needed for nausea or vomiting.   Ozempic  (2 MG/DOSE) 8 MG/3ML Sopn Generic drug: Semaglutide  (2 MG/DOSE) Inject 2 mg into the skin once a week.   pantoprazole  40 MG tablet Commonly known as: PROTONIX  Take 1  tablet (40 mg total) by mouth daily.   Vazalore  81 MG Caps Generic drug: Aspirin  Take 1 tablet by mouth daily.   Ventolin  HFA 108 (90 Base) MCG/ACT inhaler Generic drug: albuterol  Inhale 2 puffs into the lungs every 6 (six) hours as needed for wheezing or shortness of breath.               Durable Medical Equipment  (From admission, onward)           Start     Ordered   11/09/23 0000  For home use only DME Walker rolling       Question Answer Comment  Walker: With 5 Inch Wheels   Patient needs a walker to treat with the following condition General weakness      11/09/23 1135            Follow-up Information     Fernand Denyse LABOR, MD. Go in 1 week(s).  Specialty: Cardiology Contact information: [address] [place] [address] [phone]         Fernand Fredy RAMAN, MD. Schedule an appointment as soon as possible for a visit in 1 week(s).   Specialty: Internal Medicine Contact information: [address] [place] [address] [phone]                Discharge Exam: Fredricka Weights   11/08/23 1852 11/08/23 2030  Weight: 81.6 kg 81.6 kg  GENERAL:  58 y.o.-year-old patient with no acute distress. Obese LUNGS: Normal breath sounds bilaterally, no wheezing CARDIOVASCULAR: S1, S2 normal. No murmur   ABDOMEN: Soft, nontender, nondistended. Bowel sounds present.  EXTREMITIES: No  edema b/l.    NEUROLOGIC: nonfocal  patient is alert and awake SKIN: No obvious rash, lesion, or ulcer.    Condition at discharge: fair  The results of significant diagnostics from this hospitalization (including imaging, microbiology, ancillary and laboratory) are listed below for reference.   Imaging Studies: ECHOCARDIOGRAM COMPLETE Result Date: 11/09/2023    ECHOCARDIOGRAM REPORT   Patient Name:   Henry Anderson [facility] Date of Exam: 11/09/2023 Medical Rec #:  [id number]      Height:       69.0 in Accession #:    [id number]     Weight:       179.9 lb Date of Birth:   Aug 26, 1965     BSA:          1.975 m Patient Age:    57 years       BP:           88/47 mmHg Patient Gender: M              HR:           72 bpm. Exam Location:  [facility] Procedure: 2D Echo, Cardiac Doppler, Color Doppler and Intracardiac            Opacification Agent (Both Spectral and Color Flow Doppler were            utilized during procedure). Indications:     Acute myocardial infarction, unspecified I21.9  History:         Patient has prior history of Echocardiogram examinations, most                  recent 10/13/2020. Acute MI.  Sonographer:     Ashley McNeely-Sloane Referring Phys:  [id number] XILIN NIU Diagnosing Phys: Sabina Custovic IMPRESSIONS  1. Left ventricular ejection fraction, by estimation, is 60 to 65%. The left ventricle has normal function. The left ventricle has no regional wall motion abnormalities. Left ventricular diastolic parameters were normal.  2. Right ventricular systolic function is normal. The right ventricular size is normal.  3. The mitral valve is normal in structure. No evidence of mitral valve regurgitation. No evidence of mitral stenosis.  4. The aortic valve is normal in structure. Aortic valve regurgitation is not visualized. No aortic stenosis is present.  5. The inferior vena cava is normal in size with greater than 50% respiratory variability, suggesting right atrial pressure of 3 mmHg. FINDINGS  Left Ventricle: Left ventricular ejection fraction, by estimation, is 60 to 65%. The left ventricle has normal function. The left ventricle has no regional wall motion abnormalities. Definity contrast agent was given IV to delineate the left ventricular  endocardial borders. The left ventricular internal cavity size was normal in size. There is no left ventricular hypertrophy. Left ventricular diastolic parameters were normal. Right Ventricle: The  right ventricular size is normal. No increase in right ventricular wall thickness. Right ventricular systolic function is normal. Left Atrium:  Left atrial size was normal in size. Right Atrium: Right atrial size was normal in size. Pericardium: There is no evidence of pericardial effusion. Mitral Valve: The mitral valve is normal in structure. No evidence of mitral valve regurgitation. No evidence of mitral valve stenosis. MV peak gradient, 2.4 mmHg. The mean mitral valve gradient is 1.0 mmHg. Tricuspid Valve: The tricuspid valve is normal in structure. Tricuspid valve regurgitation is trivial. Aortic Valve: The aortic valve is normal in structure. Aortic valve regurgitation is not visualized. No aortic stenosis is present. Aortic valve mean gradient measures 2.0 mmHg. Aortic valve peak gradient measures 4.6 mmHg. Aortic valve area, by VTI measures 2.88 cm. Pulmonic Valve: The pulmonic valve was normal in structure. Pulmonic valve regurgitation is not visualized. Aorta: The aortic root is normal in size and structure. Venous: The inferior vena cava is normal in size with greater than 50% respiratory variability, suggesting right atrial pressure of 3 mmHg. IAS/Shunts: No atrial level shunt detected by color flow Doppler.  LEFT VENTRICLE PLAX 2D LVIDd:         3.82 cm     Diastology LVIDs:         2.70 cm     LV e' medial:    3.19 cm/s LV PW:         1.29 cm     LV E/e' medial:  21.6 LV IVS:        0.94 cm     LV e' lateral:   11.20 cm/s LVOT diam:     1.90 cm     LV E/e' lateral: 6.2 LV SV:         51 LV SV Index:   26 LVOT Area:     2.84 cm  LV Volumes (MOD) LV vol d, MOD A2C: 92.1 ml LV vol d, MOD A4C: 94.2 ml LV vol s, MOD A2C: 40.0 ml LV vol s, MOD A4C: 32.2 ml LV SV MOD A2C:     52.2 ml LV SV MOD A4C:     94.2 ml LV SV MOD BP:      57.2 ml RIGHT VENTRICLE             IVC RV Basal diam:  3.43 cm     IVC diam: 1.84 cm RV Mid diam:    1.82 cm RV S prime:     10.70 cm/s TAPSE (M-mode): 1.9 cm LEFT ATRIUM             Index        RIGHT ATRIUM           Index LA diam:        3.00 cm 1.52 cm/m   RA Area:     11.60 cm LA Vol (A2C):   36.6 ml 18.53 ml/m   RA Volume:   25.90 ml  13.11 ml/m LA Vol (A4C):   17.8 ml 9.01 ml/m LA Biplane Vol: 25.3 ml 12.81 ml/m  AORTIC VALVE                    PULMONIC VALVE AV Area (Vmax):    2.53 cm     PV Vmax:        0.77 m/s AV Area (Vmean):   2.24 cm     PV Vmean:       63.600 cm/s AV Area (VTI):  2.88 cm     PV VTI:         0.165 m AV Vmax:           107.00 cm/s  PV Peak grad:   2.4 mmHg AV Vmean:          70.800 cm/s  PV Mean grad:   2.0 mmHg AV VTI:            0.178 m      RVOT Peak grad: 2 mmHg AV Peak Grad:      4.6 mmHg AV Mean Grad:      2.0 mmHg LVOT Vmax:         95.30 cm/s LVOT Vmean:        55.900 cm/s LVOT VTI:          0.181 m LVOT/AV VTI ratio: 1.02  AORTA Ao Root diam: 3.30 cm Ao Asc diam:  2.60 cm MITRAL VALVE MV Area (PHT): 2.93 cm    SHUNTS MV Area VTI:   2.06 cm    Systemic VTI:  0.18 m MV Peak grad:  2.4 mmHg    Systemic Diam: 1.90 cm MV Mean grad:  1.0 mmHg    Pulmonic VTI:  0.138 m MV Vmax:       0.77 m/s MV Vmean:      49.8 cm/s MV Decel Time: 259 msec MV E velocity: 68.90 cm/s MV A velocity: 81.00 cm/s MV E/A ratio:  0.85 Sabina Custovic Electronically signed by Annalee Casa Signature Date/Time: 11/09/2023/11:14:44 AM    Final    DG Chest Port 1 View Result Date: 11/08/2023 EXAM: 1 VIEW(S) XRAY OF THE CHEST 11/08/2023 09:18:00 PM COMPARISON: Chest x-ray 02/09/2022, CT chest 10/05/2023. CLINICAL HISTORY: 355200 Chest pain [id number]. Patient in ICU after cardiac cath due to code STEMI today. Stated 8/10 chest pain and diaphoresis to EMS. Hx of diabetes, HTN, stroke, emphysema of lung, and current smoker. FINDINGS: LUNGS AND PLEURA: No focal pulmonary opacity. No pulmonary edema. No pleural effusion. No pneumothorax. HEART AND MEDIASTINUM: No acute abnormality of the cardiac and mediastinal silhouettes. BONES AND SOFT TISSUES: No acute osseous abnormality. IMPRESSION: 1. No acute cardiopulmonary process. Electronically signed by: Morgane Naveau MD 11/08/2023 09:24 PM EDT RP Workstation: [id number]    CARDIAC CATHETERIZATION Result Date: 11/08/2023   Prox RCA lesion is 100% stenosed.   Mid RCA lesion is 90% stenosed.   RPAV lesion is 100% stenosed.   A drug-eluting stent was successfully placed using a STENT ONYX FRONTIER 3.5X18.   A drug-eluting stent was successfully placed using a STENT ONYX FRONTIER 3.0X30.   using a BALLOON MINITREK RX 2.0X12.   Post intervention, there is a 0% residual stenosis.   Post intervention, there is a 0% residual stenosis.   Post intervention, there is a 100% residual stenosis.   The left ventricular systolic function is normal.   LV end diastolic pressure is normal.   The left ventricular ejection fraction is 50-55% by visual estimate. 1.  Inferior STEMI 2.  Occluded proximal RCA 3.  Overall normal left ventricular ejection fraction 4.  Successful primary PCI with overlapping 3.5 x 18 mm and 3.0 x 30 mm Onyx frontier drug-eluting stents proximal and mid RCA Recommendations 1.  Dual antiplatelet therapy uninterrupted x 1 year 2.  Start high intensity atorvastatin  3.  Start metoprolol succinate 25 mg daily 4.  2D echocardiogram   Microbiology: Results for orders placed or performed during the hospital encounter of 02/09/22  Resp panel by RT-PCR (  RSV, Flu A&B, Covid) Anterior Nasal Swab     Status: None   Collection Time: 02/09/22  3:16 PM   Specimen: Anterior Nasal Swab  Result Value Ref Range Status   SARS Coronavirus 2 by RT PCR NEGATIVE NEGATIVE Final    Comment: (NOTE) SARS-CoV-2 target nucleic acids are NOT DETECTED.  The SARS-CoV-2 RNA is generally detectable in upper respiratory specimens during the acute phase of infection. The lowest concentration of SARS-CoV-2 viral copies this assay can detect is 138 copies/mL. A negative result does not preclude SARS-Cov-2 infection and should not be used as the sole basis for treatment or other patient management decisions. A negative result may occur with  improper specimen collection/handling, submission of  specimen other than nasopharyngeal swab, presence of viral mutation(s) within the areas targeted by this assay, and inadequate number of viral copies(<138 copies/mL). A negative result must be combined with clinical observations, patient history, and epidemiological information. The expected result is Negative.  Fact Sheet for Patients:  bloggercourse.com  Fact Sheet for Healthcare Providers:  seriousbroker.it  This test is no t yet approved or cleared by the [place]  FDA and  has been authorized for detection and/or diagnosis of SARS-CoV-2 by FDA under an Emergency Use Authorization (EUA). This EUA will remain  in effect (meaning this test can be used) for the duration of the COVID-19 declaration under Section 564(b)(1) of the Act, 21 U.S.C.section 360bbb-3(b)(1), unless the authorization is terminated  or revoked sooner.       Influenza A by PCR NEGATIVE NEGATIVE Final   Influenza B by PCR NEGATIVE NEGATIVE Final    Comment: (NOTE) The Xpert Xpress SARS-CoV-2/FLU/RSV plus assay is intended as an aid in the diagnosis of influenza from Nasopharyngeal swab specimens and should not be used as a sole basis for treatment. Nasal washings and aspirates are unacceptable for Xpert Xpress SARS-CoV-2/FLU/RSV testing.  Fact Sheet for Patients: bloggercourse.com  Fact Sheet for Healthcare Providers: seriousbroker.it  This test is not yet approved or cleared by the [place]  FDA and has been authorized for detection and/or diagnosis of SARS-CoV-2 by FDA under an Emergency Use Authorization (EUA). This EUA will remain in effect (meaning this test can be used) for the duration of the COVID-19 declaration under Section 564(b)(1) of the Act, 21 U.S.C. section 360bbb-3(b)(1), unless the authorization is terminated or revoked.     Resp Syncytial Virus by PCR NEGATIVE NEGATIVE Final     Comment: (NOTE) Fact Sheet for Patients: bloggercourse.com  Fact Sheet for Healthcare Providers: seriousbroker.it  This test is not yet approved or cleared by the [place]  FDA and has been authorized for detection and/or diagnosis of SARS-CoV-2 by FDA under an Emergency Use Authorization (EUA). This EUA will remain in effect (meaning this test can be used) for the duration of the COVID-19 declaration under Section 564(b)(1) of the Act, 21 U.S.C. section 360bbb-3(b)(1), unless the authorization is terminated or revoked.  Performed at [facility], [address] [place], [place] [address]     Labs: CBC: Recent Labs  Lab 11/08/23 1844 11/09/23 0515 11/10/23 0535 11/11/23 0553  WBC 15.0* 13.9* 12.7* 11.4*  NEUTROABS 10.1*  --   --   --   HGB 16.2 13.5 12.1* 11.8*  HCT 47.5 40.7 35.8* 35.2*  MCV 85.4 86.2 86.7 86.3  PLT 306 262 203 192   Basic Metabolic Panel: Recent Labs  Lab 11/08/23 1844 11/09/23 0515 11/10/23 0535 11/11/23 0553  NA 138 139 140 138  K 4.0  3.9 3.2* 2.9*  CL 99 104 108 105  CO2 24 25 23 23   GLUCOSE 432* 272* 131* 164*  BUN 12 13 11 11   CREATININE 0.97 0.87 0.57* 0.69  CALCIUM  9.2 8.4* 8.1* 8.2*   Liver Function Tests: Recent Labs  Lab 11/08/23 1844 11/10/23 0535  AST 25 131*  ALT 26 40  ALKPHOS 89 67  BILITOT 0.8 0.6  PROT 7.7 5.9*  ALBUMIN 4.2 2.9*   CBG: Recent Labs  Lab 11/09/23 2136 11/10/23 1225 11/10/23 1629 11/10/23 2039 11/11/23 0749  GLUCAP 165* 105* 85 197* 153*    Discharge time spent: greater than 30 minutes.  Signed: Leita Blanch, MD [facility] 11/11/2023

## 2023-11-11 NOTE — Progress Notes (Signed)
 SUBJECTIVE: Patient is feeling much better denies any chest pain or shortness of breath.   Vitals:   11/10/23 2030 11/10/23 2344 11/11/23 0355 11/11/23 0746  BP: 108/64 99/62 109/63 117/77  Pulse: 68 72 72 73  Resp: 20 20 20    Temp: 98.5 F (36.9 C) 98.3 F (36.8 C) 98.2 F (36.8 C) 98.5 F (36.9 C)  TempSrc:  Oral    SpO2: 96% 97% 97% 96%  Weight:      Height:        Intake/Output Summary (Last 24 hours) at 11/11/2023 0928 Last data filed at 11/11/2023 0826 Gross per 24 hour  Intake 263 ml  Output --  Net 263 ml    LABS: Basic Metabolic Panel: Recent Labs    11/10/23 0535 11/11/23 0553  NA 140 138  K 3.2* 2.9*  CL 108 105  CO2 23 23  GLUCOSE 131* 164*  BUN 11 11  CREATININE 0.57* 0.69  CALCIUM  8.1* 8.2*   Liver Function Tests: Recent Labs    11/08/23 1844 11/10/23 0535  AST 25 131*  ALT 26 40  ALKPHOS 89 67  BILITOT 0.8 0.6  PROT 7.7 5.9*  ALBUMIN 4.2 2.9*   No results for input(s): LIPASE, AMYLASE in the last 72 hours. CBC: Recent Labs    11/08/23 1844 11/09/23 0515 11/10/23 0535 11/11/23 0553  WBC 15.0*   < > 12.7* 11.4*  NEUTROABS 10.1*  --   --   --   HGB 16.2   < > 12.1* 11.8*  HCT 47.5   < > 35.8* 35.2*  MCV 85.4   < > 86.7 86.3  PLT 306   < > 203 192   < > = values in this interval not displayed.   Cardiac Enzymes: No results for input(s): CKTOTAL, CKMB, CKMBINDEX, TROPONINI in the last 72 hours. BNP: Invalid input(s): POCBNP D-Dimer: No results for input(s): DDIMER in the last 72 hours. Hemoglobin A1C: Recent Labs    11/08/23 1844  HGBA1C 9.7*   Fasting Lipid Panel: Recent Labs    11/08/23 1844 11/09/23 0908  CHOL 217*  --   HDL 33*  --   LDLCALC UNABLE TO CALCULATE IF TRIGLYCERIDE OVER 400 mg/dL  --   TRIG 349*  --   CHOLHDL 6.6  --   LDLDIRECT  --  81   Thyroid Function Tests: No results for input(s): TSH, T4TOTAL, T3FREE, THYROIDAB in the last 72 hours.  Invalid input(s):  FREET3 Anemia Panel: No results for input(s): VITAMINB12, FOLATE, FERRITIN, TIBC, IRON, RETICCTPCT in the last 72 hours.   PHYSICAL EXAM General: Well developed, well nourished, in no acute distress HEENT:  Normocephalic and atramatic Neck:  No JVD.  Lungs: Clear bilaterally to auscultation and percussion. Heart: HRRR . Normal S1 and S2 without gallops or murmurs.  Abdomen: Bowel sounds are positive, abdomen soft and non-tender  Msk:  Back normal, normal gait. Normal strength and tone for age. Extremities: No clubbing, cyanosis or edema.   Neuro: Alert and oriented X 3. Psych:  Good affect, responds appropriately  TELEMETRY: Sinus rhythm  ASSESSMENT AND PLAN: Status post STEMI and PCI of the right coronary artery.  Patient has a history of hypertension hyperlipidemia and type 2 diabetes and history of CVA.  Right now is feeling much better can go home on current medication including aspirin  81 mg and Plavix  75 mg and continue atorvastatin  80 mg once a day.  He can be followed with me on Monday at 1:00 in  my office.   ICD-10-CM   1. Acute ST elevation myocardial infarction (STEMI) involving right coronary artery (HCC)  I21.11     2. General weakness  R53.1 For home use only DME Walker rolling      Principal Problem:   STEMI involving oth coronary artery of inferior wall (HCC) Active Problems:   Poorly controlled type 2 diabetes mellitus with peripheral neuropathy (HCC)   Emphysema of lung (HCC)   CVA (cerebrovascular accident) (HCC)   Nicotine  abuse   Mixed hyperlipidemia   Overweight (BMI 25.0-29.9)   Leukocytosis   HTN (hypertension)    Denyse Bathe, MD, [facility] 11/11/2023 9:28 AM

## 2023-11-13 ENCOUNTER — Telehealth: Payer: Self-pay

## 2023-11-13 ENCOUNTER — Encounter: Payer: Self-pay | Admitting: Cardiovascular Disease

## 2023-11-13 ENCOUNTER — Ambulatory Visit (INDEPENDENT_AMBULATORY_CARE_PROVIDER_SITE_OTHER): Admitting: Cardiovascular Disease

## 2023-11-13 VITALS — BP 112/66 | HR 103 | Ht 69.0 in | Wt 173.6 lb

## 2023-11-13 DIAGNOSIS — Z0130 Encounter for examination of blood pressure without abnormal findings: Secondary | ICD-10-CM

## 2023-11-13 DIAGNOSIS — F17210 Nicotine dependence, cigarettes, uncomplicated: Secondary | ICD-10-CM

## 2023-11-13 DIAGNOSIS — I4711 Inappropriate sinus tachycardia, so stated: Secondary | ICD-10-CM | POA: Diagnosis not present

## 2023-11-13 DIAGNOSIS — E782 Mixed hyperlipidemia: Secondary | ICD-10-CM

## 2023-11-13 DIAGNOSIS — I2119 ST elevation (STEMI) myocardial infarction involving other coronary artery of inferior wall: Secondary | ICD-10-CM | POA: Diagnosis not present

## 2023-11-13 DIAGNOSIS — I639 Cerebral infarction, unspecified: Secondary | ICD-10-CM | POA: Diagnosis not present

## 2023-11-13 DIAGNOSIS — E1169 Type 2 diabetes mellitus with other specified complication: Secondary | ICD-10-CM | POA: Diagnosis not present

## 2023-11-13 MED ORDER — LOSARTAN POTASSIUM 25 MG PO TABS
25.0000 mg | ORAL_TABLET | Freq: Every day | ORAL | 1 refills | Status: AC
Start: 2023-11-13 — End: ?

## 2023-11-13 NOTE — Progress Notes (Signed)
 Cardiology Office Note   Date:  11/13/2023   ID:  Henry Anderson, DOB 23-Jan-1965, MRN [id number]  PCP:  Fernand Fredy RAMAN, MD  Cardiologist:  Denyse Fernand, MD      History of Present Illness: Henry Anderson is a 58 y.o. male who presents for  Chief Complaint  Patient presents with   Follow-up    Hospital follow up    Discharged from [facility] Saturday as had IWMI STEMI. Has pleuritic chest pain, no SOB.      Past Medical History:  Diagnosis Date   Diabetes mellitus without complication (HCC)    Emphysema of lung (HCC)    Hypertension    Stroke [facility])      Past Surgical History:  Procedure Laterality Date   CORONARY/GRAFT ACUTE MI REVASCULARIZATION N/A 11/08/2023   Procedure: Coronary/Graft Acute MI Revascularization;  Surgeon: Ammon Blunt, MD;  Location: [facility] INVASIVE CV LAB;  Service: Cardiovascular;  Laterality: N/A;   FOOT SURGERY Right    KNEE SURGERY Right    LEFT HEART CATH AND CORONARY ANGIOGRAPHY N/A 11/08/2023   Procedure: LEFT HEART CATH AND CORONARY ANGIOGRAPHY;  Surgeon: Ammon Blunt, MD;  Location: [facility] INVASIVE CV LAB;  Service: Cardiovascular;  Laterality: N/A;   SHOULDER SURGERY Left      Current Outpatient Medications  Medication Sig Dispense Refill   acetaminophen  (TYLENOL ) 325 MG tablet Take 2 tablets (650 mg total) by mouth every 4 (four) hours as needed for mild pain (or temp > 37.5 C (99.5 F)).     albuterol  (PROAIR  HFA) 108 (90 Base) MCG/ACT inhaler Inhale 2 puffs into the lungs every 6 (six) hours as needed for wheezing or shortness of breath. 18 g 2   aspirin  EC (SM ASPIRIN  ADULT LOW STRENGTH) 81 MG tablet Take 1 tablet (81 mg total) by mouth daily. 90 tablet 3   atorvastatin  (LIPITOR) 80 MG tablet Take 1 tablet (80 mg total) by mouth daily. 90 tablet 4   baclofen  (LIORESAL ) 10 MG tablet Take 1 tablet (10 mg total) by mouth daily. 30 tablet 1   blood glucose meter kit and supplies KIT Use as directed 1 each 0   chlorthalidone   (HYGROTON ) 25 MG tablet Take 1 tablet (25 mg total) by mouth once daily. 90 tablet 2   clopidogrel  (PLAVIX ) 75 MG tablet Take 1 tablet (75 mg total) by mouth once daily. 90 tablet 4   Continuous Glucose Sensor (DEXCOM G7 SENSOR) MISC Use as directed. Change every 10 days . 5 each 2   gabapentin  (NEURONTIN ) 100 MG capsule Take 1 capsule (100 mg total) by mouth at bedtime. 90 capsule 2   glucose blood (ACCU-CHEK GUIDE TEST) test strip Use to check blood glucose up to 4 times a day 100 each 3   Insulin  Glargine (BASAGLAR  KWIKPEN) 100 UNIT/ML Inject 45 Units into the skin daily. 30 mL 6   insulin  lispro (HUMALOG  KWIKPEN) 200 UNIT/ML KwikPen No insulin  if blood sugar less than 150, Inject 3 units if 151-200, inject 6 units if 201-250, inject 9 units if 251-300, inject 12 units if 301-350. Sliding scale with 12 units/day max 3 mL 0   Insulin  Pen Needle (TRUEPLUS 5-BEVEL PEN NEEDLES) 32G X 4 MM MISC Inject 1 Needle with Basaglar  as directed daily. 100 each 2   Insulin  Pen Needle 31G X 6 MM MISC 1 each by Does not apply route 2 (two) times daily as needed. 100 each 1   losartan (COZAAR) 25 MG tablet Take 1  tablet (25 mg total) by mouth daily. 90 tablet 1   metFORMIN  (GLUCOPHAGE -XR) 500 MG 24 hr tablet Take 2 tablets (1,000 mg total) by mouth daily with breakfast. 180 tablet 1   metoprolol succinate (TOPROL-XL) 25 MG 24 hr tablet Take 0.5 tablets (12.5 mg total) by mouth daily. 90 tablet 3   nicotine  (NICODERM CQ  - DOSED IN MG/24 HOURS) 21 mg/24hr patch Place 1 patch (21 mg total) onto the skin daily. 28 patch 0   nitroGLYCERIN (NITROSTAT) 0.4 MG SL tablet Place 1 tablet (0.4 mg total) under the tongue every 5 (five) minutes as needed for chest pain. 25 tablet 12   ondansetron  (ZOFRAN ) 4 MG tablet Take 1 tablet (4 mg total) by mouth every 8 (eight) hours as needed for nausea or vomiting. 20 tablet 0   pantoprazole  (PROTONIX ) 40 MG tablet Take 1 tablet (40 mg total) by mouth daily. 90 tablet 0   Rightest GL300  Lancets MISC Use up to four times daily as directed to check blood sugar. 100 each 11   No current facility-administered medications for this visit.    Allergies:   Patient has no known allergies.    Social History:   reports that he has been smoking cigarettes. He has a 45 pack-year smoking history. He has quit using smokeless tobacco.  His smokeless tobacco use included chew. He reports current alcohol use. He reports that he does not use drugs.   Family History:  family history includes COPD in his maternal grandmother and mother; Diabetes in his father, paternal grandfather, paternal grandmother, sister, and sister; Heart attack in his maternal grandfather; Hyperlipidemia in his paternal grandfather and paternal grandmother; Hypertension in his paternal grandfather and paternal grandmother.    ROS:     Review of Systems  Constitutional: Negative.   HENT: Negative.    Eyes: Negative.   Respiratory: Negative.    Gastrointestinal: Negative.   Genitourinary: Negative.   Musculoskeletal: Negative.   Skin: Negative.   Neurological: Negative.   Endo/Heme/Allergies: Negative.   Psychiatric/Behavioral: Negative.    All other systems reviewed and are negative.     All other systems are reviewed and negative.    PHYSICAL EXAM: VS:  BP 112/66   Pulse (!) 103   Ht 5' 9 (1.753 m)   Wt 173 lb 9.6 oz (78.7 kg)   SpO2 98%   BMI 25.64 kg/m  , BMI Body mass index is 25.64 kg/m. Last weight:  Wt Readings from Last 3 Encounters:  11/13/23 173 lb 9.6 oz (78.7 kg)  11/08/23 179 lb 14.3 oz (81.6 kg)  10/05/23 176 lb (79.8 kg)     Physical Exam Vitals reviewed.  Constitutional:      Appearance: Normal appearance. He is normal weight.  HENT:     Head: Normocephalic.     Nose: Nose normal.     Mouth/Throat:     Mouth: Mucous membranes are moist.  Eyes:     Pupils: Pupils are equal, round, and reactive to light.  Cardiovascular:     Rate and Rhythm: Normal rate and regular  rhythm.     Pulses: Normal pulses.     Heart sounds: Normal heart sounds.  Pulmonary:     Effort: Pulmonary effort is normal.  Abdominal:     General: Abdomen is flat. Bowel sounds are normal.  Musculoskeletal:        General: Normal range of motion.     Cervical back: Normal range of motion.  Skin:  General: Skin is warm.  Neurological:     General: No focal deficit present.     Mental Status: He is alert.  Psychiatric:        Mood and Affect: Mood normal.       EKG:   Recent Labs: 11/10/2023: ALT 40 11/11/2023: BUN 11; Creatinine, Ser 0.69; Hemoglobin 11.8; Platelets 192; Potassium 2.9; Sodium 138    Lipid Panel    Component Value Date/Time   CHOL 217 (H) 11/08/2023 1844   CHOL 136 07/27/2023 1213   TRIG 650 (H) 11/08/2023 1844   HDL 33 (L) 11/08/2023 1844   HDL 33 (L) 07/27/2023 1213   CHOLHDL 6.6 11/08/2023 1844   VLDL UNABLE TO CALCULATE IF TRIGLYCERIDE OVER 400 mg/dL 89/70/7974 8155   LDLCALC UNABLE TO CALCULATE IF TRIGLYCERIDE OVER 400 mg/dL 89/70/7974 8155   LDLCALC 74 07/27/2023 1213   LDLDIRECT 81 11/09/2023 0908      Other studies Reviewed: Additional studies/ records that were reviewed today include:  Review of the above records demonstrates:       No data to display            ASSESSMENT AND PLAN:    ICD-10-CM   1. Cerebrovascular accident (CVA), unspecified mechanism (HCC)  I63.9 losartan (COZAAR) 25 MG tablet    2. STEMI involving oth coronary artery of inferior wall (HCC)  I21.19 losartan (COZAAR) 25 MG tablet   start losartan, continue GDMT  . LVEf 65%, inspite of troponins peak of  8900    3. Combined hyperlipidemia associated with type 2 diabetes mellitus (HCC)  E11.69 losartan (COZAAR) 25 MG tablet   E78.2     4. Mixed hyperlipidemia  E78.2 losartan (COZAAR) 25 MG tablet    5. Inappropriate sinus tachycardia  I47.11    started metoprolol.       Problem List Items Addressed This Visit       Cardiovascular and  Mediastinum   Cerebrovascular accident (CVA) (HCC) - Primary   Relevant Medications   losartan (COZAAR) 25 MG tablet   STEMI involving oth coronary artery of inferior wall (HCC)   Relevant Medications   losartan (COZAAR) 25 MG tablet     Endocrine   Combined hyperlipidemia associated with type 2 diabetes mellitus (HCC)   Relevant Medications   losartan (COZAAR) 25 MG tablet     Other   Mixed hyperlipidemia   Relevant Medications   losartan (COZAAR) 25 MG tablet   Other Visit Diagnoses       Inappropriate sinus tachycardia       started metoprolol.   Relevant Medications   losartan (COZAAR) 25 MG tablet          Disposition:   Return in about 4 weeks (around 12/11/2023) for set up cardiac rehab, and f/u.    Total time spent: 40 minutes  Signed,  Denyse Bathe, MD  11/13/2023 1:14 PM    [facility]

## 2023-11-13 NOTE — Transitions of Care (Post Inpatient/ED Visit) (Signed)
 11/13/2023  Name: Henry Anderson MRN: [id number] DOB: 02/24/1965  Today's TOC FU Call Status: Today's TOC FU Call Status:: Successful TOC FU Call Completed TOC FU Call Complete Date: 11/13/23 Patient's Name and Date of Birth confirmed.  Transition Care Management Follow-up Telephone Call Date of Discharge: 11/11/23 Discharge Facility: [facility]) Type of Discharge: Inpatient Admission Primary Inpatient Discharge Diagnosis:: STEMI How have you been since you were released from the hospital?: Better Any questions or concerns?: No  Items Reviewed: Did you receive and understand the discharge instructions provided?: No Medications obtained,verified, and reconciled?: Yes (Medications Reviewed) Any new allergies since your discharge?: No Dietary orders reviewed?: Yes Type of Diet Ordered:: Carb modified Do you have support at home?: Yes People in Home [RPT]: spouse Name of Support/Comfort Primary Source: [place]  Medications Reviewed Today: Medications Reviewed Today     Reviewed by Moises Reusing, RN (Case Manager) on 11/13/23 at 1032  Med List Status: <None>   Medication Order Taking? Sig Documenting Provider Last Dose Status Informant  acetaminophen  (TYLENOL ) 325 MG tablet [id number] No Take 2 tablets (650 mg total) by mouth every 4 (four) hours as needed for mild pain (or temp > 37.5 C (99.5 F)). Swayze, Ava, DO Unknown Active Spouse/Significant Other, Pharmacy Records  albuterol  (PROAIR  HFA) 108 (90 Base) MCG/ACT inhaler [id number] No Inhale 2 puffs into the lungs every 6 (six) hours as needed for wheezing or shortness of breath. Orlean Alan HERO, FNP Unknown Active Spouse/Significant Other, Pharmacy Records  aspirin  EC [facility] ASPIRIN  ADULT LOW STRENGTH) 81 MG tablet [id number]  Take 1 tablet (81 mg total) by mouth daily. Patel, Sona, MD  Active   atorvastatin  (LIPITOR) 80 MG tablet [id number]  Take 1 tablet (80 mg total) by mouth daily. Patel, Sona, MD   Active   baclofen  (LIORESAL ) 10 MG tablet [id number] No Take 1 tablet (10 mg total) by mouth daily. Fernand Fredy RAMAN, MD 11/08/2023 Active Spouse/Significant Other, Pharmacy Records  blood glucose meter kit and supplies KIT [id number]  Use as directed Fernand Fredy RAMAN, MD  Active Spouse/Significant Other, Pharmacy Records  chlorthalidone  (HYGROTON ) 25 MG tablet [id number] No Take 1 tablet (25 mg total) by mouth once daily. Fernand Fredy RAMAN, MD 11/08/2023 Active Spouse/Significant Other, Pharmacy Records  clopidogrel  (PLAVIX ) 75 MG tablet [id number]  Take 1 tablet (75 mg total) by mouth once daily. Patel, Sona, MD  Active   Continuous Glucose Sensor [facility] G7 SENSOR) [place] [id number]  Use as directed. Change every 10 days . Patel, Sona, MD  Active   gabapentin  (NEURONTIN ) 100 MG capsule [id number] No Take 1 capsule (100 mg total) by mouth at bedtime. Fernand Fredy RAMAN, MD Past Week Active Spouse/Significant Other, Pharmacy Records  glucose blood (ACCU-CHEK GUIDE TEST) test strip [id number]  Use to check blood glucose up to 4 times a day Fernand Fredy RAMAN, MD  Active Spouse/Significant Other, Pharmacy Records  Insulin  Glargine (BASAGLAR  [facility]) 100 UNIT/ML [id number] No Inject 45 Units into the skin daily. Fernand Fredy RAMAN, MD 11/08/2023 Active Spouse/Significant Other, Pharmacy Records  insulin  lispro (HUMALOG  KWIKPEN) 200 UNIT/ML KwikPen [id number] No No insulin  if blood sugar less than 150, Inject 3 units if 151-200, inject 6 units if 201-250, inject 9 units if 251-300, inject 12 units if 301-350. Sliding scale with 12 units/day max Fernand Fredy RAMAN, MD 11/08/2023 Active Spouse/Significant Other, Pharmacy Records  Insulin  Pen Needle (TRUEPLUS 5-BEVEL PEN NEEDLES) 32G X 4 MM MISC [id number]  Inject 1 Needle with Basaglar  as directed daily.  [place], Hospital Doctor, NP  Active Spouse/Significant Other, Pharmacy Records  Insulin  Pen Needle 31G X 6 MM MISC [id number]  1 each by Does not apply route 2 (two) times daily as needed. Fernand Fredy RAMAN, MD  Active Spouse/Significant Other, Pharmacy Records  lisinopril  (ZESTRIL ) 20 MG tablet [id number] No Take 1 tablet (20 mg total) by mouth once daily. Fernand Fredy RAMAN, MD 11/08/2023 Active Spouse/Significant Other, Pharmacy Records  metFORMIN  (GLUCOPHAGE -XR) 500 MG 24 hr tablet [id number] No Take 2 tablets (1,000 mg total) by mouth daily with breakfast. Fernand Fredy RAMAN, MD 11/08/2023 Active Spouse/Significant Other, Pharmacy Records  metoprolol succinate (TOPROL-XL) 25 MG 24 hr tablet [id number]  Take 0.5 tablets (12.5 mg total) by mouth daily. Patel, Sona, MD  Active   nicotine  (NICODERM CQ  - DOSED IN MG/24 HOURS) 21 mg/24hr patch [id number]  Place 1 patch (21 mg total) onto the skin daily. Patel, Sona, MD  Active   nitroGLYCERIN (NITROSTAT) 0.4 MG SL tablet [id number]  Place 1 tablet (0.4 mg total) under the tongue every 5 (five) minutes as needed for chest pain. Tobie Calix, MD  Active   ondansetron  (ZOFRAN ) 4 MG tablet [id number] No Take 1 tablet (4 mg total) by mouth every 8 (eight) hours as needed for nausea or vomiting. Fernand Fredy RAMAN, MD Unknown Active Spouse/Significant Other, Pharmacy Records  pantoprazole  (PROTONIX ) 40 MG tablet [id number] No Take 1 tablet (40 mg total) by mouth daily. Carin Gauze, NP 11/08/2023 Active Spouse/Significant Other, Pharmacy Records  Rightest GL300 Lancets MISC [id number]  Use up to four times daily as directed to check blood sugar. Fernand Fredy RAMAN, MD  Active Spouse/Significant Other, Pharmacy Records  Semaglutide , 2 MG/DOSE, (OZEMPIC , 2 MG/DOSE,) 8 MG/3ML SOPN [id number] No Inject 2 mg into the skin once a week. Fernand Fredy RAMAN, MD Past Week Active Spouse/Significant Other, Pharmacy Records            Home Care and Equipment/Supplies: Were Home Health Services Ordered?: NA Any new equipment or medical supplies ordered?: NA  Functional Questionnaire: Do you need assistance with bathing/showering or dressing?: No Do you need assistance with meal preparation?:  No Do you need assistance with eating?: No Do you have difficulty maintaining continence: No Do you need assistance with getting out of bed/getting out of a chair/moving?: No Do you have difficulty managing or taking your medications?: No  Follow up appointments reviewed: PCP Follow-up appointment confirmed?: Yes Date of PCP follow-up appointment?: 11/14/23 Follow-up Provider: [facility] Follow-up appointment confirmed?: Yes Date of Specialist follow-up appointment?: 11/13/23 Follow-Up Specialty Provider:: Dr. CANDIE Fernand Do you need transportation to your follow-up appointment?: No Do you understand care options if your condition(s) worsen?: Yes-patient verbalized understanding  SDOH Interventions Today    Flowsheet Row Most Recent Value  SDOH Interventions   Food Insecurity Interventions Intervention Not Indicated  Housing Interventions Intervention Not Indicated  Transportation Interventions Intervention Not Indicated  Utilities Interventions Intervention Not Indicated    Medford Balboa, BSN, RN [place]  VBCI - [facility] RN Care Manager [phone]

## 2023-11-14 ENCOUNTER — Encounter: Payer: Self-pay | Admitting: Internal Medicine

## 2023-11-14 ENCOUNTER — Ambulatory Visit: Admitting: Internal Medicine

## 2023-11-14 ENCOUNTER — Other Ambulatory Visit: Payer: Self-pay

## 2023-11-14 ENCOUNTER — Other Ambulatory Visit: Payer: Self-pay | Admitting: *Deleted

## 2023-11-14 VITALS — BP 108/70 | HR 100 | Ht 69.0 in | Wt 172.8 lb

## 2023-11-14 DIAGNOSIS — E663 Overweight: Secondary | ICD-10-CM

## 2023-11-14 DIAGNOSIS — I152 Hypertension secondary to endocrine disorders: Secondary | ICD-10-CM

## 2023-11-14 DIAGNOSIS — E876 Hypokalemia: Secondary | ICD-10-CM | POA: Insufficient documentation

## 2023-11-14 DIAGNOSIS — I10 Essential (primary) hypertension: Secondary | ICD-10-CM

## 2023-11-14 DIAGNOSIS — Z23 Encounter for immunization: Secondary | ICD-10-CM | POA: Insufficient documentation

## 2023-11-14 DIAGNOSIS — Z716 Tobacco abuse counseling: Secondary | ICD-10-CM | POA: Insufficient documentation

## 2023-11-14 DIAGNOSIS — E782 Mixed hyperlipidemia: Secondary | ICD-10-CM | POA: Diagnosis not present

## 2023-11-14 DIAGNOSIS — E1165 Type 2 diabetes mellitus with hyperglycemia: Secondary | ICD-10-CM | POA: Diagnosis not present

## 2023-11-14 DIAGNOSIS — F419 Anxiety disorder, unspecified: Secondary | ICD-10-CM | POA: Insufficient documentation

## 2023-11-14 DIAGNOSIS — I2119 ST elevation (STEMI) myocardial infarction involving other coronary artery of inferior wall: Secondary | ICD-10-CM

## 2023-11-14 DIAGNOSIS — E1159 Type 2 diabetes mellitus with other circulatory complications: Secondary | ICD-10-CM

## 2023-11-14 DIAGNOSIS — E1142 Type 2 diabetes mellitus with diabetic polyneuropathy: Secondary | ICD-10-CM

## 2023-11-14 DIAGNOSIS — E1169 Type 2 diabetes mellitus with other specified complication: Secondary | ICD-10-CM

## 2023-11-14 LAB — POCT CBG (FASTING - GLUCOSE)-MANUAL ENTRY: Glucose Fasting, POC: 209 mg/dL — AB (ref 70–99)

## 2023-11-14 MED ORDER — BUPROPION HCL ER (XL) 150 MG PO TB24
150.0000 mg | ORAL_TABLET | ORAL | 2 refills | Status: DC
  Filled 2023-11-14: qty 30, 30d supply, fill #0

## 2023-11-14 NOTE — Progress Notes (Signed)
 Established Patient Office Visit  Subjective:  Patient ID: Henry Anderson, male    DOB: November 11, 1965  Age: 58 y.o. MRN: [id number]  Chief Complaint  Patient presents with   Hospitalization Follow-up    Patient is here today for follow up since being discharged from the hospital. He was admitted 10/29//25-11/11/23 for STEMI. Echo showed EF of 60-65%; He was started on low dose BB. Recommended dual therapy antiplatelet with Plavix  and aspirin . Recommended patient complete cardiac rehab. While hospitalized, his Lantus  was increased to 45 units daily as HbgA1c increased from 7.7 to 9.1% He reports working on his diet and is trying to make healthier choices. He reports he has not changed his diet and does not endorse missing any medication since his HbgA1c was checked in July so he does not understand how his HbgA1c has worsened. He was seen 09/2023 and was not tolerating the 2mg  weekly Ozempic  injections and was recommended to Ozempic  1 mg weekly injection. He states its been 4 weeks since his last injection as he was given refill for Ozempic  2 mg injections. He did not start them. Will hold off on Ozempic  at this time.  He reports he hadn't been doing the sliding scale insulin  regularly. He endorses he did give sliding scale insulin  of 7 units yesterday. Reinforced need to do sliding scale with meals as prescribed to enhance blood sugar control.  Patient was given potassium replacement when in the hospital. Will check CBC, BMP today.  Patient reports he is using nicotine  patches and has reduced his smoking to only 4 cigarettes since being discharged from the hospital. Patient reports having increased anxiety/stress at home as they are having family live with them and he states there is a lot of yelling. He is not on anything for his anxiety. Will try Wellbutrin 150 mg to aid in anxiety control and smoking cessation. Reinforced need to stop smoking all together.  Patient would like flu shot  today.    No other concerns at this time.   Past Medical History:  Diagnosis Date   Diabetes mellitus without complication (HCC)    Emphysema of lung (HCC)    Hypertension    Stroke [facility])     Past Surgical History:  Procedure Laterality Date   CORONARY/GRAFT ACUTE MI REVASCULARIZATION N/A 11/08/2023   Procedure: Coronary/Graft Acute MI Revascularization;  Surgeon: Ammon Blunt, MD;  Location: [facility] INVASIVE CV LAB;  Service: Cardiovascular;  Laterality: N/A;   FOOT SURGERY Right    KNEE SURGERY Right    LEFT HEART CATH AND CORONARY ANGIOGRAPHY N/A 11/08/2023   Procedure: LEFT HEART CATH AND CORONARY ANGIOGRAPHY;  Surgeon: Ammon Blunt, MD;  Location: [facility] INVASIVE CV LAB;  Service: Cardiovascular;  Laterality: N/A;   SHOULDER SURGERY Left     Social History   Socioeconomic History   Marital status: Married    Spouse name: Not on file   Number of children: Not on file   Years of education: Not on file   Highest education level: Not on file  Occupational History   Not on file  Tobacco Use   Smoking status: Every Day    Current packs/day: 1.50    Average packs/day: 1.5 packs/day for 30.0 years (45.0 ttl pk-yrs)    Types: Cigarettes   Smokeless tobacco: Former    Types: Chew   Tobacco comments:    Patient has cut down to ~1 ppd since having stroke. Gave [place] Quit info. Patient down to 1/2 ppd  Vaping Use  Vaping status: Never Used  Substance and Sexual Activity   Alcohol use: Yes    Comment: occasionally 1-2 beers   Drug use: Never   Sexual activity: Not on file  Other Topics Concern   Not on file  Social History Narrative   Not on file   Social Drivers of Health   Financial Resource Strain: High Risk (11/23/2021)   Overall Financial Resource Strain (CARDIA)    Difficulty of Paying Living Expenses: Very hard  Food Insecurity: No Food Insecurity (11/13/2023)   Hunger Vital Sign    Worried About Running Out of Food in the Last Year: Never true     Ran Out of Food in the Last Year: Never true  Transportation Needs: No Transportation Needs (11/13/2023)   PRAPARE - Administrator, Civil Service (Medical): No    Lack of Transportation (Non-Medical): No  Physical Activity: Inactive (11/23/2021)   Exercise Vital Sign    Days of Exercise per Week: 0 days    Minutes of Exercise per Session: 0 min  Stress: Stress Concern Present (11/23/2021)   [employer] of Occupational Health - Occupational Stress Questionnaire    Feeling of Stress : Very much  Social Connections: Moderately Isolated (11/09/2023)   Social Connection and Isolation Panel    Frequency of Communication with Friends and Family: More than three times a week    Frequency of Social Gatherings with Friends and Family: Twice a week    Attends Religious Services: Never    Database Administrator or Organizations: No    Attends Banker Meetings: Never    Marital Status: Married  Catering Manager Violence: Not At Risk (11/13/2023)   Humiliation, Afraid, Rape, and Kick questionnaire    Fear of Current or Ex-Partner: No    Emotionally Abused: No    Physically Abused: No    Sexually Abused: No    Family History  Problem Relation Age of Onset   COPD Mother    Diabetes Father    Diabetes Sister    Diabetes Sister    COPD Maternal Grandmother    Heart attack Maternal Grandfather    Diabetes Paternal Grandmother    Hyperlipidemia Paternal Grandmother    Hypertension Paternal Grandmother    Diabetes Paternal Grandfather    Hyperlipidemia Paternal Grandfather    Hypertension Paternal Grandfather     No Known Allergies  Outpatient Medications Prior to Visit  Medication Sig   acetaminophen  (TYLENOL ) 325 MG tablet Take 2 tablets (650 mg total) by mouth every 4 (four) hours as needed for mild pain (or temp > 37.5 C (99.5 F)).   albuterol  (PROAIR  HFA) 108 (90 Base) MCG/ACT inhaler Inhale 2 puffs into the lungs every 6 (six) hours as needed for  wheezing or shortness of breath.   aspirin  EC (SM ASPIRIN  ADULT LOW STRENGTH) 81 MG tablet Take 1 tablet (81 mg total) by mouth daily.   atorvastatin  (LIPITOR) 80 MG tablet Take 1 tablet (80 mg total) by mouth daily.   baclofen  (LIORESAL ) 10 MG tablet Take 1 tablet (10 mg total) by mouth daily.   blood glucose meter kit and supplies KIT Use as directed   chlorthalidone  (HYGROTON ) 25 MG tablet Take 1 tablet (25 mg total) by mouth once daily.   clopidogrel  (PLAVIX ) 75 MG tablet Take 1 tablet (75 mg total) by mouth once daily.   Continuous Glucose Sensor (DEXCOM G7 SENSOR) MISC Use as directed. Change every 10 days .   gabapentin  (NEURONTIN )  100 MG capsule Take 1 capsule (100 mg total) by mouth at bedtime.   glucose blood (ACCU-CHEK GUIDE TEST) test strip Use to check blood glucose up to 4 times a day   Insulin  Glargine (BASAGLAR  KWIKPEN) 100 UNIT/ML Inject 45 Units into the skin daily.   insulin  lispro (HUMALOG  KWIKPEN) 200 UNIT/ML KwikPen No insulin  if blood sugar less than 150, Inject 3 units if 151-200, inject 6 units if 201-250, inject 9 units if 251-300, inject 12 units if 301-350. Sliding scale with 12 units/day max   Insulin  Pen Needle (TRUEPLUS 5-BEVEL PEN NEEDLES) 32G X 4 MM MISC Inject 1 Needle with Basaglar  as directed daily.   Insulin  Pen Needle 31G X 6 MM MISC 1 each by Does not apply route 2 (two) times daily as needed.   metFORMIN  (GLUCOPHAGE -XR) 500 MG 24 hr tablet Take 2 tablets (1,000 mg total) by mouth daily with breakfast.   nicotine  (NICODERM CQ  - DOSED IN MG/24 HOURS) 21 mg/24hr patch Place 1 patch (21 mg total) onto the skin daily.   nitroGLYCERIN (NITROSTAT) 0.4 MG SL tablet Place 1 tablet (0.4 mg total) under the tongue every 5 (five) minutes as needed for chest pain.   ondansetron  (ZOFRAN ) 4 MG tablet Take 1 tablet (4 mg total) by mouth every 8 (eight) hours as needed for nausea or vomiting.   pantoprazole  (PROTONIX ) 40 MG tablet Take 1 tablet (40 mg total) by mouth daily.    Rightest GL300 Lancets MISC Use up to four times daily as directed to check blood sugar.   losartan (COZAAR) 25 MG tablet Take 1 tablet (25 mg total) by mouth daily. (Patient not taking: Reported on 11/14/2023)   metoprolol succinate (TOPROL-XL) 25 MG 24 hr tablet Take 0.5 tablets (12.5 mg total) by mouth daily. (Patient not taking: Reported on 11/14/2023)   No facility-administered medications prior to visit.    Review of Systems  Constitutional:  Positive for malaise/fatigue. Negative for chills and fever.  HENT: Negative.  Negative for congestion and sore throat.   Eyes: Negative.  Negative for blurred vision and pain.  Respiratory: Negative.  Negative for cough and shortness of breath.   Cardiovascular: Negative.  Negative for chest pain, palpitations and leg swelling.  Gastrointestinal: Negative.  Negative for abdominal pain, blood in stool, constipation, diarrhea, heartburn, melena, nausea and vomiting.  Genitourinary: Negative.  Negative for dysuria, flank pain, frequency and urgency.  Musculoskeletal: Negative.  Negative for joint pain and myalgias.  Skin: Negative.   Neurological:  Positive for dizziness (wiht position changes). Negative for tingling, sensory change, weakness and headaches.  Endo/Heme/Allergies: Negative.   Psychiatric/Behavioral:  Negative for depression and suicidal ideas. The patient is nervous/anxious.        Objective:   BP 108/70   Pulse 100   Ht 5' 9 (1.753 m)   Wt 172 lb 12.8 oz (78.4 kg)   SpO2 96%   BMI 25.52 kg/m   Vitals:   11/14/23 1330  BP: 108/70  Pulse: 100  Height: 5' 9 (1.753 m)  Weight: 172 lb 12.8 oz (78.4 kg)  SpO2: 96%  BMI (Calculated): 25.51    Physical Exam Vitals and nursing note reviewed.  Constitutional:      General: He is not in acute distress.    Appearance: Normal appearance. He is not ill-appearing.  HENT:     Head: Normocephalic and atraumatic.     Nose: Nose normal.     Mouth/Throat:     Mouth: Mucous  membranes are  moist.     Pharynx: Oropharynx is clear.  Eyes:     Conjunctiva/sclera: Conjunctivae normal.     Pupils: Pupils are equal, round, and reactive to light.  Cardiovascular:     Rate and Rhythm: Normal rate and regular rhythm.     Pulses: Normal pulses.     Heart sounds: Normal heart sounds.  Pulmonary:     Effort: Pulmonary effort is normal.     Breath sounds: Normal breath sounds. No wheezing or rhonchi.  Abdominal:     General: Bowel sounds are normal. There is no distension.     Palpations: Abdomen is soft.     Tenderness: There is no abdominal tenderness.  Musculoskeletal:        General: Normal range of motion.     Cervical back: Normal range of motion and neck supple.     Right lower leg: No edema.     Left lower leg: No edema.  Skin:    General: Skin is warm and dry.     Capillary Refill: Capillary refill takes less than 2 seconds.     Findings: Bruising (right forearm from hospitalization per patient) present.  Neurological:     General: No focal deficit present.     Mental Status: He is alert and oriented to person, place, and time.     Sensory: No sensory deficit.     Motor: No weakness.  Psychiatric:        Mood and Affect: Mood normal.        Behavior: Behavior normal.        Judgment: Judgment normal.      Results for orders placed or performed in visit on 11/14/23  POCT CBG (Fasting - Glucose)  Result Value Ref Range   Glucose Fasting, POC 209 (A) 70 - 99 mg/dL    Recent Results (from the past 2160 hours)  POCT CBG (Fasting - Glucose)     Status: Abnormal   Collection Time: 08/28/23 10:59 AM  Result Value Ref Range   Glucose Fasting, POC 209 (A) 70 - 99 mg/dL  POCT CBG (Fasting - Glucose)     Status: Abnormal   Collection Time: 09/18/23 11:10 AM  Result Value Ref Range   Glucose Fasting, POC 270 (A) 70 - 99 mg/dL  CBC with Differential/Platelet     Status: Abnormal   Collection Time: 11/08/23  6:44 PM  Result Value Ref Range   WBC 15.0  (H) 4.0 - 10.5 K/uL   RBC 5.56 4.22 - 5.81 MIL/uL   Hemoglobin 16.2 13.0 - 17.0 g/dL   HCT 52.4 60.9 - 47.9 %   MCV 85.4 80.0 - 100.0 fL   MCH 29.1 26.0 - 34.0 pg   MCHC 34.1 30.0 - 36.0 g/dL   RDW 87.5 88.4 - 84.4 %   Platelets 306 150 - 400 K/uL   nRBC 0.0 0.0 - 0.2 %   Neutrophils Relative % 67 %   Neutro Abs 10.1 (H) 1.7 - 7.7 K/uL   Lymphocytes Relative 25 %   Lymphs Abs 3.8 0.7 - 4.0 K/uL   Monocytes Relative 6 %   Monocytes Absolute 0.8 0.1 - 1.0 K/uL   Eosinophils Relative 1 %   Eosinophils Absolute 0.1 0.0 - 0.5 K/uL   Basophils Relative 1 %   Basophils Absolute 0.1 0.0 - 0.1 K/uL   Immature Granulocytes 0 %   Abs Immature Granulocytes 0.06 0.00 - 0.07 K/uL    Comment: Performed at [facility],  [address], [place], [place] [address]  Protime-INR     Status: None   Collection Time: 11/08/23  6:44 PM  Result Value Ref Range   Prothrombin Time 12.4 11.4 - 15.2 seconds   INR 0.9 0.8 - 1.2    Comment: (NOTE) INR goal varies based on device and disease states. Performed at [facility], [address] [place], [place] [address]   APTT     Status: Abnormal   Collection Time: 11/08/23  6:44 PM  Result Value Ref Range   aPTT 23 (L) 24 - 36 seconds    Comment: Performed at [facility], [address] [place], [place] [address]  Comprehensive metabolic panel     Status: Abnormal   Collection Time: 11/08/23  6:44 PM  Result Value Ref Range   Sodium 138 135 - 145 mmol/L   Potassium 4.0 3.5 - 5.1 mmol/L   Chloride 99 98 - 111 mmol/L   CO2 24 22 - 32 mmol/L   Glucose, Bld 432 (H) 70 - 99 mg/dL    Comment: Glucose reference range applies only to samples taken after fasting for at least 8 hours.   BUN 12 6 - 20 mg/dL   Creatinine, Ser 9.02 0.61 - 1.24 mg/dL   Calcium  9.2 8.9 - 10.3 mg/dL   Total Protein 7.7 6.5 - 8.1 g/dL   Albumin 4.2 3.5 - 5.0 g/dL   AST 25 15 - 41 U/L   ALT 26 0 - 44 U/L   Alkaline Phosphatase 89 38 - 126 U/L    Total Bilirubin 0.8 0.0 - 1.2 mg/dL   GFR, Estimated >39 >39 mL/min    Comment: (NOTE) Calculated using the CKD-EPI Creatinine Equation (2021)    Anion gap 15 5 - 15    Comment: Performed at [facility], [address], [place], [place] [address]  Troponin I (High Sensitivity)     Status: None   Collection Time: 11/08/23  6:44 PM  Result Value Ref Range   Troponin I (High Sensitivity) 2 <18 ng/L    Comment: (NOTE) Elevated high sensitivity troponin I (hsTnI) values and significant  changes across serial measurements may suggest ACS but many other  chronic and acute conditions are known to elevate hsTnI results.  Refer to the Links section for chest pain algorithms and additional  guidance. Performed at [facility], [address] [place], [place] [address]   Lipid panel     Status: Abnormal   Collection Time: 11/08/23  6:44 PM  Result Value Ref Range   Cholesterol 217 (H) 0 - 200 mg/dL   Triglycerides 349 (H) <150 mg/dL   HDL 33 (L) >59 mg/dL   Total CHOL/HDL Ratio 6.6 RATIO   VLDL UNABLE TO CALCULATE IF TRIGLYCERIDE OVER 400 mg/dL 0 - 40 mg/dL   LDL Cholesterol UNABLE TO CALCULATE IF TRIGLYCERIDE OVER 400 mg/dL 0 - 99 mg/dL    Comment:        Total Cholesterol/HDL:CHD Risk Coronary Heart Disease Risk Table                     Men   Women  1/2 Average Risk   3.4   3.3  Average Risk       5.0   4.4  2 X Average Risk   9.6   7.1  3 X Average Risk  23.4   11.0        Use the calculated Patient Ratio  above and the CHD Risk Table to determine the patient's CHD Risk.        ATP III CLASSIFICATION (LDL):  <100     mg/dL   Optimal  899-870  mg/dL   Near or Above                    Optimal  130-159  mg/dL   Borderline  839-810  mg/dL   High  >809     mg/dL   Very High Performed at [facility], [address] [place], [place] [address]   Miscellaneous [facility] test (send-out)     Status: None   Collection Time: 11/08/23  6:44 PM  Result  Value Ref Range   [facility] test code [id number]    [facility] test name LOW DENSITY LIPOPROTEIN     Comment: Performed at [facility], [address] [place], [place] [address]   Misc [facility] result COMMENT     Comment: (NOTE) Test Ordered: [id number] LDL Cholesterol (Direct) LDL Chol. (Direct)             109         [H ] mg/dL    BN     Reference Range: 0-99                                  LDL Direct Comment:                                      BN   Performed At: [facility]  [address] [place], [place] [id number] Jennette Shorter MD Ey:[id number]   Hemoglobin A1c     Status: Abnormal   Collection Time: 11/08/23  6:44 PM  Result Value Ref Range   Hgb A1c MFr Bld 9.7 (H) 4.8 - 5.6 %    Comment: (NOTE) Diagnosis of Diabetes The following HbA1c ranges recommended by the American Diabetes Association (ADA) may be used as an aid in the diagnosis of diabetes mellitus.  Hemoglobin             Suggested A1C NGSP%              Diagnosis  <5.7                   Non Diabetic  5.7-6.4                Pre-Diabetic  >6.4                   Diabetic  <7.0                   Glycemic control for                       adults with diabetes.     Mean Plasma Glucose 231.69 mg/dL    Comment: Performed at [facility], [address], [place], [place] [address]  CG4 I-STAT (Lactic acid)     Status: None   Collection Time: 11/08/23  7:35 PM  Result Value Ref Range   Lactic Acid, Venous 0.6 0.5 - 1.9 mmol/L  POCT Activated clotting time     Status: None   Collection Time: 11/08/23  7:42 PM  Result Value Ref Range   Activated Clotting Time 781  seconds    Comment: Reference range 74-137 seconds for patients not on anticoagulant therapy.  POCT Activated clotting time     Status: None   Collection Time: 11/08/23  7:49 PM  Result Value Ref Range   Activated Clotting Time 475 seconds    Comment: Reference range 74-137 seconds for patients not on anticoagulant therapy.   Glucose, capillary     Status: Abnormal   Collection Time: 11/08/23  8:24 PM  Result Value Ref Range   Glucose-Capillary 382 (H) 70 - 99 mg/dL    Comment: Glucose reference range applies only to samples taken after fasting for at least 8 hours.  Troponin I (High Sensitivity)     Status: Abnormal   Collection Time: 11/08/23  9:22 PM  Result Value Ref Range   Troponin I (High Sensitivity) 18,962 (HH) <18 ng/L    Comment: CRITICAL RESULT CALLED TO, READ BACK BY AND VERIFIED WITH ABIGALE FOWLKES @2157  ON 11/08/23 SK[facility] (NOTE) Elevated high sensitivity troponin I (hsTnI) values and significant  changes across serial measurements may suggest ACS but many other  chronic and acute conditions are known to elevate hsTnI results.  Refer to the Links section for chest pain algorithms and additional  guidance. Performed at [facility], [address], [place], [place] [address]   Urine Drug Screen, Qualitative [facility] only)     Status: Abnormal   Collection Time: 11/08/23  9:28 PM  Result Value Ref Range   Tricyclic, Ur Screen NONE DETECTED NONE DETECTED   Amphetamines, Ur Screen POSITIVE (A) NONE DETECTED    Comment: (NOTE) Trazodone is metabolized in vivo to several metabolites, including pharmacologically active m-CPP, which is excreted in the urine. Immunoassay screens for amphetamines and MDMA have potential cross-reactivity with these compounds and may provide false positive  results.     MDMA (Ecstasy)Ur Screen NONE DETECTED NONE DETECTED   Cocaine Metabolite,Ur [place] NONE DETECTED NONE DETECTED   Opiate, Ur Screen NONE DETECTED NONE DETECTED   Phencyclidine (PCP) Ur S NONE DETECTED NONE DETECTED   Cannabinoid 50 Ng, Ur [place] NONE DETECTED NONE DETECTED   Barbiturates, Ur Screen NONE DETECTED NONE DETECTED   Benzodiazepine, Ur Scrn POSITIVE (A) NONE DETECTED   Methadone Scn, Ur NONE DETECTED NONE DETECTED    Comment: (NOTE) Tricyclics + metabolites, urine    Cutoff 1000  ng/mL Amphetamines + metabolites, urine  Cutoff 1000 ng/mL MDMA (Ecstasy), urine              Cutoff 500 ng/mL Cocaine Metabolite, urine          Cutoff 300 ng/mL Opiate + metabolites, urine        Cutoff 300 ng/mL Phencyclidine (PCP), urine         Cutoff 25 ng/mL Cannabinoid, urine                 Cutoff 50 ng/mL Barbiturates + metabolites, urine  Cutoff 200 ng/mL Benzodiazepine, urine              Cutoff 200 ng/mL Methadone, urine                   Cutoff 300 ng/mL  The urine drug screen provides only a preliminary, unconfirmed analytical test result and should not be used for non-medical purposes. Clinical consideration and professional judgment should be applied to any positive drug screen result due to possible interfering substances. A more specific alternate chemical method must be used in order to obtain a confirmed analytical result.  Gas chromatography / mass spectrometry (GC/MS) is the preferred confirm atory method. Performed at [facility], [address] [place], [place] [address]   Glucose, capillary     Status: Abnormal   Collection Time: 11/08/23 11:57 PM  Result Value Ref Range   Glucose-Capillary 251 (H) 70 - 99 mg/dL    Comment: Glucose reference range applies only to samples taken after fasting for at least 8 hours.  CBC     Status: Abnormal   Collection Time: 11/09/23  5:15 AM  Result Value Ref Range   WBC 13.9 (H) 4.0 - 10.5 K/uL   RBC 4.72 4.22 - 5.81 MIL/uL   Hemoglobin 13.5 13.0 - 17.0 g/dL   HCT 59.2 60.9 - 47.9 %   MCV 86.2 80.0 - 100.0 fL   MCH 28.6 26.0 - 34.0 pg   MCHC 33.2 30.0 - 36.0 g/dL   RDW 87.5 88.4 - 84.4 %   Platelets 262 150 - 400 K/uL   nRBC 0.0 0.0 - 0.2 %    Comment: Performed at [facility], [address] [place], [place] [address]  Basic metabolic panel     Status: Abnormal   Collection Time: 11/09/23  5:15 AM  Result Value Ref Range   Sodium 139 135 - 145 mmol/L   Potassium 3.9 3.5 - 5.1 mmol/L    Chloride 104 98 - 111 mmol/L   CO2 25 22 - 32 mmol/L   Glucose, Bld 272 (H) 70 - 99 mg/dL    Comment: Glucose reference range applies only to samples taken after fasting for at least 8 hours.   BUN 13 6 - 20 mg/dL   Creatinine, Ser 9.12 0.61 - 1.24 mg/dL   Calcium  8.4 (L) 8.9 - 10.3 mg/dL   GFR, Estimated >39 >39 mL/min    Comment: (NOTE) Calculated using the CKD-EPI Creatinine Equation (2021)    Anion gap 10 5 - 15    Comment: Performed at [facility], [address] [place], [place] [address]  Lipoprotein A (LPA)     Status: None   Collection Time: 11/09/23  5:15 AM  Result Value Ref Range   Lipoprotein (a) <8.4 <75.0 nmol/L    Comment: (NOTE) **Results verified by repeat testing** This test was developed and its performance characteristics determined by [facility]. It has not been cleared or approved by the Food and Drug Administration. Note:  Values greater than or equal to 75.0 nmol/L may       indicate an independent risk factor for CHD,       but must be evaluated with caution when applied       to non-Caucasian populations due to the       influence of genetic factors on Lp(a) across       ethnicities. Performed At: [facility] [address] [place], [place] [id number] Jennette Shorter MD Ey:[id number]   Lactic acid, plasma     Status: Abnormal   Collection Time: 11/09/23  5:51 AM  Result Value Ref Range   Lactic Acid, Venous 2.0 (HH) 0.5 - 1.9 mmol/L    Comment: CRITICAL RESULT CALLED TO, READ BACK BY AND VERIFIED WITH ABIGAIL FOLKES 11/09/23 9380 MW Performed at [facility], [address] [place], [place] [address]   Glucose, capillary     Status: Abnormal   Collection Time: 11/09/23  7:40 AM  Result Value Ref Range   Glucose-Capillary 257 (H) 70 - 99 mg/dL    Comment: Glucose reference  range applies only to samples taken after fasting for at least 8 hours.  ECHOCARDIOGRAM COMPLETE     Status: None   Collection Time: 11/09/23  8:45  AM  Result Value Ref Range   Weight 2,878.33 oz   Height 69 in   BP 83/58 mmHg   Ao pk vel 1.07 m/s   AV Area VTI 2.88 cm2   AR max vel 2.53 cm2   AV Mean grad 2.0 mmHg   AV Peak grad 4.6 mmHg   Single Plane A2C EF 56.6 %   Single Plane A4C EF 65.8 %   Calc EF 61.3 %   S' Lateral 2.70 cm   AV Area mean vel 2.24 cm2   Area-P 1/2 2.93 cm2   MV VTI 2.06 cm2   Est EF 60 - 65%   Lactic acid, plasma     Status: None   Collection Time: 11/09/23  9:08 AM  Result Value Ref Range   Lactic Acid, Venous 1.7 0.5 - 1.9 mmol/L    Comment: Performed at [facility], [address] [place], [place] [address]  LDL cholesterol, direct     Status: None   Collection Time: 11/09/23  9:08 AM  Result Value Ref Range   Direct LDL 81 0 - 99 mg/dL    Comment: (NOTE) Performed At: [facility] [facility] [address] [place], [place] [id number] Jennette Shorter MD Ey:[phone]   Glucose, capillary     Status: Abnormal   Collection Time: 11/09/23 11:02 AM  Result Value Ref Range   Glucose-Capillary 229 (H) 70 - 99 mg/dL    Comment: Glucose reference range applies only to samples taken after fasting for at least 8 hours.  Glucose, capillary     Status: Abnormal   Collection Time: 11/09/23  4:20 PM  Result Value Ref Range   Glucose-Capillary 183 (H) 70 - 99 mg/dL    Comment: Glucose reference range applies only to samples taken after fasting for at least 8 hours.  Glucose, capillary     Status: Abnormal   Collection Time: 11/09/23  9:36 PM  Result Value Ref Range   Glucose-Capillary 165 (H) 70 - 99 mg/dL    Comment: Glucose reference range applies only to samples taken after fasting for at least 8 hours.  CBC     Status: Abnormal   Collection Time: 11/10/23  5:35 AM  Result Value Ref Range   WBC 12.7 (H) 4.0 - 10.5 K/uL   RBC 4.13 (L) 4.22 - 5.81 MIL/uL   Hemoglobin 12.1 (L) 13.0 - 17.0 g/dL   HCT 64.1 (L) 60.9 - 47.9 %   MCV 86.7 80.0 - 100.0 fL   MCH 29.3 26.0 - 34.0 pg   MCHC  33.8 30.0 - 36.0 g/dL   RDW 87.0 88.4 - 84.4 %   Platelets 203 150 - 400 K/uL   nRBC 0.0 0.0 - 0.2 %    Comment: Performed at [facility], [address] [place], [place] [address]  Comprehensive metabolic panel     Status: Abnormal   Collection Time: 11/10/23  5:35 AM  Result Value Ref Range   Sodium 140 135 - 145 mmol/L   Potassium 3.2 (L) 3.5 - 5.1 mmol/L   Chloride 108 98 - 111 mmol/L   CO2 23 22 - 32 mmol/L   Glucose, Bld 131 (H) 70 - 99 mg/dL    Comment: Glucose reference range applies only to samples taken after fasting for at least 8 hours.  BUN 11 6 - 20 mg/dL   Creatinine, Ser 9.42 (L) 0.61 - 1.24 mg/dL   Calcium  8.1 (L) 8.9 - 10.3 mg/dL   Total Protein 5.9 (L) 6.5 - 8.1 g/dL   Albumin 2.9 (L) 3.5 - 5.0 g/dL   AST 868 (H) 15 - 41 U/L   ALT 40 0 - 44 U/L   Alkaline Phosphatase 67 38 - 126 U/L   Total Bilirubin 0.6 0.0 - 1.2 mg/dL   GFR, Estimated >39 >39 mL/min    Comment: (NOTE) Calculated using the CKD-EPI Creatinine Equation (2021)    Anion gap 9 5 - 15    Comment: Performed at [facility], [address] [place], [place] [address]  Glucose, capillary     Status: Abnormal   Collection Time: 11/10/23 12:25 PM  Result Value Ref Range   Glucose-Capillary 105 (H) 70 - 99 mg/dL    Comment: Glucose reference range applies only to samples taken after fasting for at least 8 hours.  Glucose, capillary     Status: None   Collection Time: 11/10/23  4:29 PM  Result Value Ref Range   Glucose-Capillary 85 70 - 99 mg/dL    Comment: Glucose reference range applies only to samples taken after fasting for at least 8 hours.  Glucose, capillary     Status: Abnormal   Collection Time: 11/10/23  8:39 PM  Result Value Ref Range   Glucose-Capillary 197 (H) 70 - 99 mg/dL    Comment: Glucose reference range applies only to samples taken after fasting for at least 8 hours.  Basic metabolic panel with GFR     Status: Abnormal   Collection Time: 11/11/23  5:53 AM   Result Value Ref Range   Sodium 138 135 - 145 mmol/L   Potassium 2.9 (L) 3.5 - 5.1 mmol/L   Chloride 105 98 - 111 mmol/L   CO2 23 22 - 32 mmol/L   Glucose, Bld 164 (H) 70 - 99 mg/dL    Comment: Glucose reference range applies only to samples taken after fasting for at least 8 hours.   BUN 11 6 - 20 mg/dL   Creatinine, Ser 9.30 0.61 - 1.24 mg/dL   Calcium  8.2 (L) 8.9 - 10.3 mg/dL   GFR, Estimated >39 >39 mL/min    Comment: (NOTE) Calculated using the CKD-EPI Creatinine Equation (2021)    Anion gap 10 5 - 15    Comment: Performed at [facility], [address] [place], [place] [address]  CBC     Status: Abnormal   Collection Time: 11/11/23  5:53 AM  Result Value Ref Range   WBC 11.4 (H) 4.0 - 10.5 K/uL   RBC 4.08 (L) 4.22 - 5.81 MIL/uL   Hemoglobin 11.8 (L) 13.0 - 17.0 g/dL   HCT 64.7 (L) 60.9 - 47.9 %   MCV 86.3 80.0 - 100.0 fL   MCH 28.9 26.0 - 34.0 pg   MCHC 33.5 30.0 - 36.0 g/dL   RDW 87.2 88.4 - 84.4 %   Platelets 192 150 - 400 K/uL   nRBC 0.0 0.0 - 0.2 %    Comment: Performed at [facility], [address] [place], [place] [address]  Glucose, capillary     Status: Abnormal   Collection Time: 11/11/23  7:49 AM  Result Value Ref Range   Glucose-Capillary 153 (H) 70 - 99 mg/dL    Comment: Glucose reference range applies only to samples taken after fasting for at least 8 hours.  POCT CBG (Fasting - Glucose)     Status: Abnormal   Collection Time: 11/14/23  1:40 PM  Result Value Ref Range   Glucose Fasting, POC 209 (A) 70 - 99 mg/dL      Assessment & Plan:  Continue insulin  as prescribed. Hold off on Ozempic . Start Wellbutrin 150 mg for anxiety/smoking cessation. Check CBC, BMP today. Reinforced strict diet control and exercise as tolerated. Keep specialists appointments as scheduled. Reinforced smoking cessation. Flu shot given. Problem List Items Addressed This Visit     Poorly controlled type 2 diabetes mellitus with peripheral neuropathy  (HCC)   Relevant Medications   buPROPion (WELLBUTRIN XL) 150 MG 24 hr tablet   Other Relevant Orders   POCT CBG (Fasting - Glucose) (Completed)   Essential hypertension, benign   Relevant Orders   CBC with Diff   Basic metabolic panel with GFR   Hypertension associated with diabetes (HCC) - Primary   Combined hyperlipidemia associated with type 2 diabetes mellitus (HCC)   STEMI involving oth coronary artery of inferior wall (HCC)   Overweight (BMI 25.0-29.9)   Hypokalemia   Relevant Orders   Basic metabolic panel with GFR   Anxiety   Relevant Medications   buPROPion (WELLBUTRIN XL) 150 MG 24 hr tablet   Encounter for smoking cessation counseling   Relevant Medications   buPROPion (WELLBUTRIN XL) 150 MG 24 hr tablet   Needs flu shot   Relevant Orders   Influenza, MDCK, trivalent, PF(Flucelvax egg-free) (Completed)    Return in about 2 weeks (around 11/28/2023).   Total time spent: 30 minutes. This time includes review of previous notes and results and patient face to face interaction during today's visit.    FERNAND FREDY RAMAN, MD  11/14/2023   This document may have been prepared by [facility] Voice Recognition software and as such may include unintentional dictation errors.

## 2023-11-15 ENCOUNTER — Ambulatory Visit: Payer: Self-pay | Admitting: Internal Medicine

## 2023-11-15 LAB — CBC WITH DIFFERENTIAL/PLATELET
Basophils Absolute: 0.1 x10E3/uL (ref 0.0–0.2)
Basos: 1 %
EOS (ABSOLUTE): 0.2 x10E3/uL (ref 0.0–0.4)
Eos: 1 %
Hematocrit: 44.9 % (ref 37.5–51.0)
Hemoglobin: 15.3 g/dL (ref 13.0–17.7)
Immature Grans (Abs): 0.1 x10E3/uL (ref 0.0–0.1)
Immature Granulocytes: 1 %
Lymphocytes Absolute: 2.4 x10E3/uL (ref 0.7–3.1)
Lymphs: 18 %
MCH: 30.1 pg (ref 26.6–33.0)
MCHC: 34.1 g/dL (ref 31.5–35.7)
MCV: 88 fL (ref 79–97)
Monocytes Absolute: 0.9 x10E3/uL (ref 0.1–0.9)
Monocytes: 7 %
Neutrophils Absolute: 10.1 x10E3/uL — ABNORMAL HIGH (ref 1.4–7.0)
Neutrophils: 72 %
Platelets: 330 x10E3/uL (ref 150–450)
RBC: 5.09 x10E6/uL (ref 4.14–5.80)
RDW: 12.5 % (ref 11.6–15.4)
WBC: 13.7 x10E3/uL — ABNORMAL HIGH (ref 3.4–10.8)

## 2023-11-15 LAB — BASIC METABOLIC PANEL WITH GFR
BUN/Creatinine Ratio: 9 (ref 9–20)
BUN: 9 mg/dL (ref 6–24)
CO2: 20 mmol/L (ref 20–29)
Calcium: 9.7 mg/dL (ref 8.7–10.2)
Chloride: 97 mmol/L (ref 96–106)
Creatinine, Ser: 0.98 mg/dL (ref 0.76–1.27)
Glucose: 175 mg/dL — ABNORMAL HIGH (ref 70–99)
Potassium: 3.7 mmol/L (ref 3.5–5.2)
Sodium: 136 mmol/L (ref 134–144)
eGFR: 91 mL/min/1.73 (ref >59)

## 2023-11-20 NOTE — Progress Notes (Signed)
 Patient notified

## 2023-11-25 ENCOUNTER — Other Ambulatory Visit: Payer: Self-pay

## 2023-11-25 ENCOUNTER — Other Ambulatory Visit: Payer: Self-pay | Admitting: Internal Medicine

## 2023-11-25 DIAGNOSIS — E1165 Type 2 diabetes mellitus with hyperglycemia: Secondary | ICD-10-CM

## 2023-11-27 ENCOUNTER — Other Ambulatory Visit: Payer: Self-pay

## 2023-11-27 ENCOUNTER — Encounter: Attending: Cardiovascular Disease

## 2023-11-27 DIAGNOSIS — Z48812 Encounter for surgical aftercare following surgery on the circulatory system: Secondary | ICD-10-CM | POA: Insufficient documentation

## 2023-11-27 DIAGNOSIS — I213 ST elevation (STEMI) myocardial infarction of unspecified site: Secondary | ICD-10-CM | POA: Insufficient documentation

## 2023-11-27 DIAGNOSIS — Z955 Presence of coronary angioplasty implant and graft: Secondary | ICD-10-CM | POA: Insufficient documentation

## 2023-11-27 MED ORDER — HUMALOG KWIKPEN 200 UNIT/ML ~~LOC~~ SOPN
PEN_INJECTOR | SUBCUTANEOUS | 0 refills | Status: DC
  Filled 2023-11-27: qty 3, 25d supply, fill #0

## 2023-11-27 NOTE — Progress Notes (Signed)
 Virtual Visit completed. Patient informed on EP and RD appointment and 6 Minute walk test. Patient also informed of patient health questionnaires on My Chart. Patient Verbalizes understanding. Visit diagnosis can be found in [facility] 11/08/2023.

## 2023-11-28 ENCOUNTER — Other Ambulatory Visit: Payer: Self-pay

## 2023-11-28 ENCOUNTER — Ambulatory Visit (INDEPENDENT_AMBULATORY_CARE_PROVIDER_SITE_OTHER): Admitting: Internal Medicine

## 2023-11-28 ENCOUNTER — Other Ambulatory Visit: Payer: Self-pay | Admitting: Internal Medicine

## 2023-11-28 ENCOUNTER — Encounter: Payer: Self-pay | Admitting: Internal Medicine

## 2023-11-28 ENCOUNTER — Other Ambulatory Visit

## 2023-11-28 VITALS — BP 110/68 | HR 95 | Ht 69.0 in | Wt 176.6 lb

## 2023-11-28 DIAGNOSIS — E1142 Type 2 diabetes mellitus with diabetic polyneuropathy: Secondary | ICD-10-CM

## 2023-11-28 DIAGNOSIS — Z6826 Body mass index (BMI) 26.0-26.9, adult: Secondary | ICD-10-CM | POA: Insufficient documentation

## 2023-11-28 DIAGNOSIS — R002 Palpitations: Secondary | ICD-10-CM | POA: Diagnosis not present

## 2023-11-28 DIAGNOSIS — F17210 Nicotine dependence, cigarettes, uncomplicated: Secondary | ICD-10-CM

## 2023-11-28 DIAGNOSIS — E1169 Type 2 diabetes mellitus with other specified complication: Secondary | ICD-10-CM | POA: Diagnosis not present

## 2023-11-28 DIAGNOSIS — E663 Overweight: Secondary | ICD-10-CM

## 2023-11-28 DIAGNOSIS — E1165 Type 2 diabetes mellitus with hyperglycemia: Secondary | ICD-10-CM

## 2023-11-28 DIAGNOSIS — R110 Nausea: Secondary | ICD-10-CM | POA: Diagnosis not present

## 2023-11-28 DIAGNOSIS — E782 Mixed hyperlipidemia: Secondary | ICD-10-CM

## 2023-11-28 DIAGNOSIS — Z720 Tobacco use: Secondary | ICD-10-CM

## 2023-11-28 DIAGNOSIS — J441 Chronic obstructive pulmonary disease with (acute) exacerbation: Secondary | ICD-10-CM | POA: Insufficient documentation

## 2023-11-28 DIAGNOSIS — I152 Hypertension secondary to endocrine disorders: Secondary | ICD-10-CM

## 2023-11-28 DIAGNOSIS — E1159 Type 2 diabetes mellitus with other circulatory complications: Secondary | ICD-10-CM

## 2023-11-28 LAB — POCT CBG (FASTING - GLUCOSE)-MANUAL ENTRY: Glucose Fasting, POC: 242 mg/dL — AB (ref 70–99)

## 2023-11-28 MED ORDER — TRUEPLUS 5-BEVEL PEN NEEDLES 32G X 4 MM MISC
2 refills | Status: DC
  Filled 2023-11-28: qty 100, fill #0

## 2023-11-28 MED ORDER — BUDESONIDE-FORMOTEROL FUMARATE 160-4.5 MCG/ACT IN AERO
2.0000 | INHALATION_SPRAY | Freq: Two times a day (BID) | RESPIRATORY_TRACT | 12 refills | Status: AC
  Filled 2023-11-28: qty 10.2, 30d supply, fill #0
  Filled 2023-12-27: qty 10.2, 30d supply, fill #1
  Filled 2024-01-28: qty 10.2, 30d supply, fill #2

## 2023-11-28 MED ORDER — ALBUTEROL SULFATE HFA 108 (90 BASE) MCG/ACT IN AERS
2.0000 | INHALATION_SPRAY | Freq: Four times a day (QID) | RESPIRATORY_TRACT | 2 refills | Status: AC | PRN
  Filled 2023-11-28: qty 6.7, 25d supply, fill #0

## 2023-11-28 MED ORDER — DEXCOM G7 SENSOR MISC
1.0000 | 2 refills | Status: AC
  Filled 2023-11-28: qty 5, 50d supply, fill #0
  Filled 2023-11-29: qty 3, 30d supply, fill #0
  Filled 2023-12-04: qty 5, 50d supply, fill #0
  Filled 2023-12-12: qty 3, 30d supply, fill #0
  Filled 2024-01-10: qty 3, 30d supply, fill #1
  Filled 2024-02-14: qty 3, 30d supply, fill #2
  Filled ????-??-??: fill #0

## 2023-11-28 MED ORDER — BUPROPION HCL ER (XL) 300 MG PO TB24
300.0000 mg | ORAL_TABLET | ORAL | 2 refills | Status: AC
  Filled 2023-11-28: qty 30, 30d supply, fill #0
  Filled 2023-12-27: qty 30, 30d supply, fill #1
  Filled 2024-01-28: qty 30, 30d supply, fill #2

## 2023-11-28 NOTE — Progress Notes (Signed)
lip

## 2023-11-28 NOTE — Progress Notes (Signed)
 Established Patient Office Visit  Subjective:  Patient ID: Henry Anderson, male    DOB: 07-Nov-1965  Age: 58 y.o. MRN: [id number]  Chief Complaint  Patient presents with   Follow-up    2 week follow up    Patient is here today for follow up. He reports he is doing well but has complaints of shortness of breath.   He has COPD and is actively working on smoking cessation. He reports smoking 10 cigarettes a day. He states he is taking his Wellbutrin as prescribed and has noticed some improvement in his anxiety. Will increase to Wellbutrin 300 mg daily. He needs refill on albuterol  inhaler today. Patient reports having to use his albuterol  inhaler at least twice a day for the last 2 weeks. Patient is not currently on LABA therapy so will start Symbicort twice daily. Educated patient to rinse mouth out with water after use to prevent thrush.   He states his fasting blood glucose levels are around 120 before his insulin . He reports needing 9-12 units of additional insulin  at bedtime for blood glucose levels > 250. Recommend patient reduce intake of carbohydrates and concentrated sweets and switch to reduce sugar or sugar free options. Patient has gained 4 lbs since his last visit and reports he starts cardiac rehab tomorrow. He has appointment 12/12/23 to see his Cardiologist.  Patient denies headache, chest pain, abdominal distress, palpitations at this time.    No other concerns at this time.   Past Medical History:  Diagnosis Date   Diabetes mellitus without complication (HCC)    Emphysema of lung (HCC)    Hypertension    Stroke [facility])     Past Surgical History:  Procedure Laterality Date   CORONARY/GRAFT ACUTE MI REVASCULARIZATION N/A 11/08/2023   Procedure: Coronary/Graft Acute MI Revascularization;  Surgeon: Ammon Blunt, MD;  Location: [facility] INVASIVE CV LAB;  Service: Cardiovascular;  Laterality: N/A;   FOOT SURGERY Right    KNEE SURGERY Right    LEFT HEART CATH AND  CORONARY ANGIOGRAPHY N/A 11/08/2023   Procedure: LEFT HEART CATH AND CORONARY ANGIOGRAPHY;  Surgeon: Ammon Blunt, MD;  Location: [facility] INVASIVE CV LAB;  Service: Cardiovascular;  Laterality: N/A;   SHOULDER SURGERY Left     Social History   Socioeconomic History   Marital status: Married    Spouse name: Not on file   Number of children: Not on file   Years of education: Not on file   Highest education level: Not on file  Occupational History   Not on file  Tobacco Use   Smoking status: Every Day    Current packs/day: 1.50    Average packs/day: 1.5 packs/day for 30.0 years (45.0 ttl pk-yrs)    Types: Cigarettes   Smokeless tobacco: Former    Types: Chew   Tobacco comments:    Patient has cut down to ~1 ppd since having stroke. Gave [place] Quit info. Patient down to 1/2 ppd  Vaping Use   Vaping status: Never Used  Substance and Sexual Activity   Alcohol use: Yes    Comment: occasionally 1-2 beers   Drug use: Never   Sexual activity: Not on file  Other Topics Concern   Not on file  Social History Narrative   Not on file   Social Drivers of Health   Financial Resource Strain: High Risk (11/23/2021)   Overall Financial Resource Strain (CARDIA)    Difficulty of Paying Living Expenses: Very hard  Food Insecurity: No Food Insecurity (11/13/2023)  Hunger Vital Sign    Worried About Running Out of Food in the Last Year: Never true    Ran Out of Food in the Last Year: Never true  Transportation Needs: No Transportation Needs (11/13/2023)   PRAPARE - Administrator, Civil Service (Medical): No    Lack of Transportation (Non-Medical): No  Physical Activity: Inactive (11/23/2021)   Exercise Vital Sign    Days of Exercise per Week: 0 days    Minutes of Exercise per Session: 0 min  Stress: Stress Concern Present (11/23/2021)   Henry Anderson [employer] Occupational [employer] - Occupational Stress Questionnaire    Feeling of Stress : Very much  Social Connections:  Moderately Isolated (11/09/2023)   Social Connection and Isolation Panel    Frequency of Communication with Friends and Family: More than three times a week    Frequency of Social Gatherings with Friends and Family: Twice a week    Attends Religious Services: Never    Database Administrator or Organizations: No    Attends Banker Meetings: Never    Marital Status: Married  Catering Manager Violence: Not At Risk (11/13/2023)   Humiliation, Afraid, Rape, and Kick questionnaire    Fear of Current or Ex-Partner: No    Emotionally Abused: No    Physically Abused: No    Sexually Abused: No    Family History  Problem Relation Age of Onset   COPD Mother    Diabetes Father    Diabetes Sister    Diabetes Sister    COPD Maternal Grandmother    Heart attack Maternal Grandfather    Diabetes Paternal Grandmother    Hyperlipidemia Paternal Grandmother    Hypertension Paternal Grandmother    Diabetes Paternal Grandfather    Hyperlipidemia Paternal Grandfather    Hypertension Paternal Grandfather     No Known Allergies  Outpatient Medications Prior to Visit  Medication Sig   acetaminophen  (TYLENOL ) 325 MG tablet Take 2 tablets (650 mg total) by mouth every 4 (four) hours as needed for mild pain (or temp > 37.5 C (99.5 F)).   aspirin  EC (SM ASPIRIN  ADULT LOW STRENGTH) 81 MG tablet Take 1 tablet (81 mg total) by mouth daily.   atorvastatin  (LIPITOR) 80 MG tablet Take 1 tablet (80 mg total) by mouth daily.   baclofen  (LIORESAL ) 10 MG tablet Take 1 tablet (10 mg total) by mouth daily.   blood glucose meter kit and supplies KIT Use as directed   chlorthalidone  (HYGROTON ) 25 MG tablet Take 1 tablet (25 mg total) by mouth once daily.   clopidogrel  (PLAVIX ) 75 MG tablet Take 1 tablet (75 mg total) by mouth once daily.   gabapentin  (NEURONTIN ) 100 MG capsule Take 1 capsule (100 mg total) by mouth at bedtime.   glucose blood (ACCU-CHEK GUIDE TEST) test strip Use to check blood glucose up  to 4 times a day   Insulin  Glargine (BASAGLAR  KWIKPEN) 100 UNIT/ML Inject 45 Units into the skin daily.   insulin  lispro (HUMALOG  KWIKPEN) 200 UNIT/ML KwikPen No insulin  if blood sugar less than 150, Inject 3 units if 151-200, inject 6 units if 201-250, inject 9 units if 251-300, inject 12 units if 301-350. Sliding scale with 12 units/day max   Insulin  Pen Needle 31G X 6 MM MISC 1 each by Does not apply route 2 (two) times daily as needed.   losartan (COZAAR) 25 MG tablet Take 1 tablet (25 mg total) by mouth daily.   metFORMIN  (GLUCOPHAGE -XR) 500 MG  24 hr tablet Take 2 tablets (1,000 mg total) by mouth daily with breakfast.   metoprolol succinate (TOPROL-XL) 25 MG 24 hr tablet Take 0.5 tablets (12.5 mg total) by mouth daily.   nicotine  (NICODERM CQ  - DOSED IN MG/24 HOURS) 21 mg/24hr patch Place 1 patch (21 mg total) onto the skin daily.   nitroGLYCERIN (NITROSTAT) 0.4 MG SL tablet Place 1 tablet (0.4 mg total) under the tongue every 5 (five) minutes as needed for chest pain.   ondansetron  (ZOFRAN ) 4 MG tablet Take 1 tablet (4 mg total) by mouth every 8 (eight) hours as needed for nausea or vomiting.   pantoprazole  (PROTONIX ) 40 MG tablet Take 1 tablet (40 mg total) by mouth daily.   Rightest GL300 Lancets MISC Use up to four times daily as directed to check blood sugar.   [DISCONTINUED] albuterol  (PROAIR  HFA) 108 (90 Base) MCG/ACT inhaler Inhale 2 puffs into the lungs every 6 (six) hours as needed for wheezing or shortness of breath.   [DISCONTINUED] buPROPion (WELLBUTRIN XL) 150 MG 24 hr tablet Take 1 tablet (150 mg total) by mouth every morning.   [DISCONTINUED] Continuous Glucose Sensor (DEXCOM G7 SENSOR) MISC Use as directed. Change every 10 days .   [DISCONTINUED] Insulin  Pen Needle (TRUEPLUS 5-BEVEL PEN NEEDLES) 32G X 4 MM MISC Inject 1 Needle with Basaglar  as directed daily.   No facility-administered medications prior to visit.    Review of Systems  Constitutional: Negative.  Negative  for chills, fever and malaise/fatigue.  HENT: Negative.  Negative for congestion and sore throat.   Eyes: Negative.  Negative for blurred vision and pain.  Respiratory:  Positive for shortness of breath (COPD). Negative for cough.   Cardiovascular: Negative.  Negative for chest pain, palpitations and leg swelling.  Gastrointestinal: Negative.  Negative for abdominal pain, blood in stool, constipation, diarrhea, heartburn, melena, nausea and vomiting.  Genitourinary: Negative.  Negative for dysuria, flank pain, frequency and urgency.  Musculoskeletal: Negative.  Negative for joint pain and myalgias.  Skin: Negative.   Neurological: Negative.  Negative for dizziness, tingling, sensory change, weakness and headaches.  Endo/Heme/Allergies: Negative.   Psychiatric/Behavioral: Negative.  Negative for depression and suicidal ideas. The patient is not nervous/anxious.        Objective:   BP 110/68   Pulse 95   Ht 5' 9 (1.753 m)   Wt 176 lb 9.6 oz (80.1 kg)   SpO2 97%   BMI 26.08 kg/m   Vitals:   11/28/23 1259  BP: 110/68  Pulse: 95  Height: 5' 9 (1.753 m)  Weight: 176 lb 9.6 oz (80.1 kg)  SpO2: 97%  BMI (Calculated): 26.07    Physical Exam Vitals and nursing note reviewed.  Constitutional:      General: He is not in acute distress.    Appearance: Normal appearance. He is not ill-appearing.  HENT:     Head: Normocephalic and atraumatic.     Nose: Nose normal.     Mouth/Throat:     Mouth: Mucous membranes are moist.     Pharynx: Oropharynx is clear.  Eyes:     Conjunctiva/sclera: Conjunctivae normal.     Pupils: Pupils are equal, round, and reactive to light.  Cardiovascular:     Rate and Rhythm: Normal rate and regular rhythm.     Pulses: Normal pulses.     Heart sounds: Normal heart sounds.  Pulmonary:     Effort: Pulmonary effort is normal.     Breath sounds: Wheezing present. No rhonchi.  Abdominal:     General: Bowel sounds are normal. There is no distension.      Palpations: Abdomen is soft.     Tenderness: There is no abdominal tenderness.  Musculoskeletal:        General: Normal range of motion.     Cervical back: Normal range of motion and neck supple.     Right lower leg: No edema.     Left lower leg: No edema.  Skin:    General: Skin is warm and dry.     Capillary Refill: Capillary refill takes less than 2 seconds.  Neurological:     General: No focal deficit present.     Mental Status: He is alert and oriented to person, place, and time.     Sensory: No sensory deficit.     Motor: No weakness.  Psychiatric:        Mood and Affect: Mood normal.        Behavior: Behavior normal.        Judgment: Judgment normal.      Results for orders placed or performed in visit on 11/28/23  POCT CBG (Fasting - Glucose)  Result Value Ref Range   Glucose Fasting, POC 242 (A) 70 - 99 mg/dL    Recent Results (from the past 2160 hours)  POCT CBG (Fasting - Glucose)     Status: Abnormal   Collection Time: 09/18/23 11:10 AM  Result Value Ref Range   Glucose Fasting, POC 270 (A) 70 - 99 mg/dL  CBC with Differential/Platelet     Status: Abnormal   Collection Time: 11/08/23  6:44 PM  Result Value Ref Range   WBC 15.0 (H) 4.0 - 10.5 K/uL   RBC 5.56 4.22 - 5.81 MIL/uL   Hemoglobin 16.2 13.0 - 17.0 g/dL   HCT 52.4 60.9 - 47.9 %   MCV 85.4 80.0 - 100.0 fL   MCH 29.1 26.0 - 34.0 pg   MCHC 34.1 30.0 - 36.0 g/dL   RDW 87.5 88.4 - 84.4 %   Platelets 306 150 - 400 K/uL   nRBC 0.0 0.0 - 0.2 %   Neutrophils Relative % 67 %   Neutro Abs 10.1 (H) 1.7 - 7.7 K/uL   Lymphocytes Relative 25 %   Lymphs Abs 3.8 0.7 - 4.0 K/uL   Monocytes Relative 6 %   Monocytes Absolute 0.8 0.1 - 1.0 K/uL   Eosinophils Relative 1 %   Eosinophils Absolute 0.1 0.0 - 0.5 K/uL   Basophils Relative 1 %   Basophils Absolute 0.1 0.0 - 0.1 K/uL   Immature Granulocytes 0 %   Abs Immature Granulocytes 0.06 0.00 - 0.07 K/uL    Comment: Performed at [facility], [address] [place], [place] [address]  Protime-INR     Status: None   Collection Time: 11/08/23  6:44 PM  Result Value Ref Range   Prothrombin Time 12.4 11.4 - 15.2 seconds   INR 0.9 0.8 - 1.2    Comment: (NOTE) INR goal varies based on device and disease states. Performed at [facility], [address] [place], [place] [address]   APTT     Status: Abnormal   Collection Time: 11/08/23  6:44 PM  Result Value Ref Range   aPTT 23 (L) 24 - 36 seconds    Comment: Performed at [facility], [address] [place], [place] [address]  Comprehensive metabolic panel     Status: Abnormal   Collection Time: 11/08/23  6:44 PM  Result Value Ref Range   Sodium 138 135 - 145 mmol/L   Potassium 4.0 3.5 - 5.1 mmol/L   Chloride 99 98 - 111 mmol/L   CO2 24 22 - 32 mmol/L   Glucose, Bld 432 (H) 70 - 99 mg/dL    Comment: Glucose reference range applies only to samples taken after fasting for at least 8 hours.   BUN 12 6 - 20 mg/dL   Creatinine, Ser 9.02 0.61 - 1.24 mg/dL   Calcium  9.2 8.9 - 10.3 mg/dL   Total Protein 7.7 6.5 - 8.1 g/dL   Albumin 4.2 3.5 - 5.0 g/dL   AST 25 15 - 41 U/L   ALT 26 0 - 44 U/L   Alkaline Phosphatase 89 38 - 126 U/L   Total Bilirubin 0.8 0.0 - 1.2 mg/dL   GFR, Estimated >39 >39 mL/min    Comment: (NOTE) Calculated using the CKD-EPI Creatinine Equation (2021)    Anion gap 15 5 - 15    Comment: Performed at [facility], [address] [place], [place] [address]  Troponin I (High Sensitivity)     Status: None   Collection Time: 11/08/23  6:44 PM  Result Value Ref Range   Troponin I (High Sensitivity) 2 <18 ng/L    Comment: (NOTE) Elevated high sensitivity troponin I (hsTnI) values and significant  changes across serial measurements may suggest ACS but many other  chronic and acute conditions are known to elevate hsTnI results.  Refer to the Links section for chest pain algorithms and additional  guidance. Performed at [facility], [address] [place], [place] [address]   Lipid panel     Status: Abnormal   Collection Time: 11/08/23  6:44 PM  Result Value Ref Range   Cholesterol 217 (H) 0 - 200 mg/dL   Triglycerides 349 (H) <150 mg/dL   HDL 33 (L) >59 mg/dL   Total CHOL/HDL Ratio 6.6 RATIO   VLDL UNABLE TO CALCULATE IF TRIGLYCERIDE OVER 400 mg/dL 0 - 40 mg/dL   LDL Cholesterol UNABLE TO CALCULATE IF TRIGLYCERIDE OVER 400 mg/dL 0 - 99 mg/dL    Comment:        Total Cholesterol/HDL:CHD Risk Coronary Heart Disease Risk Table                     Men   Women  1/2 Average Risk   3.4   3.3  Average Risk       5.0   4.4  2 X Average Risk   9.6   7.1  3 X Average Risk  23.4   11.0        Use the calculated Patient Ratio above and the CHD Risk Table to determine the patient's CHD Risk.        ATP III CLASSIFICATION (LDL):  <100     mg/dL   Optimal  899-870  mg/dL   Near or Above                    Optimal  130-159  mg/dL   Borderline  839-810  mg/dL   High  >809     mg/dL   Very High Performed at [facility], [address] [place], [place] [address]   Miscellaneous [facility] test (send-out)     Status: None   Collection Time: 11/08/23  6:44 PM  Result Value Ref Range   [facility] test code [phone]  [facility] test name LOW DENSITY LIPOPROTEIN     Comment: Performed at [facility], [address] [place], [place] [address]   Misc [facility] result COMMENT     Comment: (NOTE) Test Ordered: [id number] LDL Cholesterol (Direct) LDL Chol. (Direct)             109         [H ] mg/dL    BN     Reference Range: 0-99                                  LDL Direct Comment:                                      BN   Performed At: [facility] [place] [address] [place], [place] [id number] Jennette Shorter MD Ey:[id number]   Hemoglobin A1c     Status: Abnormal   Collection Time: 11/08/23  6:44 PM  Result Value Ref Range   Hgb A1c MFr Bld 9.7 (H) 4.8 - 5.6 %    Comment: (NOTE) Diagnosis  of Diabetes The following HbA1c ranges recommended by the American Diabetes Association (ADA) may be used as an aid in the diagnosis of diabetes mellitus.  Hemoglobin             Suggested A1C NGSP%              Diagnosis  <5.7                   Non Diabetic  5.7-6.4                Pre-Diabetic  >6.4                   Diabetic  <7.0                   Glycemic control for                       adults with diabetes.     Mean Plasma Glucose 231.69 mg/dL    Comment: Performed at [facility], [address], [place], [place] [address]  CG4 I-STAT (Lactic acid)     Status: None   Collection Time: 11/08/23  7:35 PM  Result Value Ref Range   Lactic Acid, Venous 0.6 0.5 - 1.9 mmol/L  POCT Activated clotting time     Status: None   Collection Time: 11/08/23  7:42 PM  Result Value Ref Range   Activated Clotting Time 781 seconds    Comment: Reference range 74-137 seconds for patients not on anticoagulant therapy.  POCT Activated clotting time     Status: None   Collection Time: 11/08/23  7:49 PM  Result Value Ref Range   Activated Clotting Time 475 seconds    Comment: Reference range 74-137 seconds for patients not on anticoagulant therapy.  Glucose, capillary     Status: Abnormal   Collection Time: 11/08/23  8:24 PM  Result Value Ref Range   Glucose-Capillary 382 (H) 70 - 99 mg/dL    Comment: Glucose reference range applies only to samples taken after fasting for at least 8 hours.  Troponin I (High Sensitivity)     Status: Abnormal   Collection Time: 11/08/23  9:22 PM  Result Value Ref  Range   Troponin I (High Sensitivity) 18,962 (HH) <18 ng/L    Comment: CRITICAL RESULT CALLED TO, READ BACK BY AND VERIFIED WITH ABIGALE FOWLKES @2157  ON 11/08/23 [facility] (NOTE) Elevated high sensitivity troponin I (hsTnI) values and significant  changes across serial measurements may suggest ACS but many other  chronic and acute conditions are known to elevate hsTnI results.  Refer to the  Links section for chest pain algorithms and additional  guidance. Performed at [facility], [address], [place], [place] [address]   Urine Drug Screen, [facility] only)     Status: Abnormal   Collection Time: 11/08/23  9:28 PM  Result Value Ref Range   Tricyclic, Ur Screen NONE DETECTED NONE DETECTED   Amphetamines, Ur Screen POSITIVE (A) NONE DETECTED    Comment: (NOTE) Trazodone is metabolized in vivo to several metabolites, including pharmacologically active m-CPP, which is excreted in the urine. Immunoassay screens for amphetamines and MDMA have potential cross-reactivity with these compounds and may provide false positive  results.     MDMA (Ecstasy)Ur Screen NONE DETECTED NONE DETECTED   Cocaine Metabolite,Ur [place] NONE DETECTED NONE DETECTED   Opiate, Ur Screen NONE DETECTED NONE DETECTED   Phencyclidine (PCP) Ur S NONE DETECTED NONE DETECTED   Cannabinoid 50 Ng, Ur [place] NONE DETECTED NONE DETECTED   Barbiturates, Ur Screen NONE DETECTED NONE DETECTED   Benzodiazepine, Ur Scrn POSITIVE (A) NONE DETECTED   Methadone Scn, Ur NONE DETECTED NONE DETECTED    Comment: (NOTE) Tricyclics + metabolites, urine    Cutoff 1000 ng/mL Amphetamines + metabolites, urine  Cutoff 1000 ng/mL MDMA (Ecstasy), urine              Cutoff 500 ng/mL Cocaine Metabolite, urine          Cutoff 300 ng/mL Opiate + metabolites, urine        Cutoff 300 ng/mL Phencyclidine (PCP), urine         Cutoff 25 ng/mL Cannabinoid, urine                 Cutoff 50 ng/mL Barbiturates + metabolites, urine  Cutoff 200 ng/mL Benzodiazepine, urine              Cutoff 200 ng/mL Methadone, urine                   Cutoff 300 ng/mL  The urine drug screen provides only a preliminary, unconfirmed analytical test result and should not be used for non-medical purposes. Clinical consideration and professional judgment should be applied to any positive drug screen result due to possible interfering  substances. A more specific alternate chemical method must be used in order to obtain a confirmed analytical result. Gas chromatography / mass spectrometry (GC/MS) is the preferred confirm atory method. Performed at [facility], [address] [place], [place] [address]   Glucose, capillary     Status: Abnormal   Collection Time: 11/08/23 11:57 PM  Result Value Ref Range   Glucose-Capillary 251 (H) 70 - 99 mg/dL    Comment: Glucose reference range applies only to samples taken after fasting for at least 8 hours.  CBC     Status: Abnormal   Collection Time: 11/09/23  5:15 AM  Result Value Ref Range   WBC 13.9 (H) 4.0 - 10.5 K/uL   RBC 4.72 4.22 - 5.81 MIL/uL   Hemoglobin 13.5 13.0 - 17.0 g/dL   HCT 59.2 60.9 - 47.9 %   MCV 86.2 80.0 -  100.0 fL   MCH 28.6 26.0 - 34.0 pg   MCHC 33.2 30.0 - 36.0 g/dL   RDW 87.5 88.4 - 84.4 %   Platelets 262 150 - 400 K/uL   nRBC 0.0 0.0 - 0.2 %    Comment: Performed at [facility], [address] [place], [place] [address]  Basic metabolic panel     Status: Abnormal   Collection Time: 11/09/23  5:15 AM  Result Value Ref Range   Sodium 139 135 - 145 mmol/L   Potassium 3.9 3.5 - 5.1 mmol/L   Chloride 104 98 - 111 mmol/L   CO2 25 22 - 32 mmol/L   Glucose, Bld 272 (H) 70 - 99 mg/dL    Comment: Glucose reference range applies only to samples taken after fasting for at least 8 hours.   BUN 13 6 - 20 mg/dL   Creatinine, Ser 9.12 0.61 - 1.24 mg/dL   Calcium  8.4 (L) 8.9 - 10.3 mg/dL   GFR, Estimated >39 >39 mL/min    Comment: (NOTE) Calculated using the CKD-EPI Creatinine Equation (2021)    Anion gap 10 5 - 15    Comment: Performed at [facility], [address] [place], [place] [address]  Lipoprotein A (LPA)     Status: None   Collection Time: 11/09/23  5:15 AM  Result Value Ref Range   Lipoprotein (a) <8.4 <75.0 nmol/L    Comment: (NOTE) **Results verified by repeat testing** This test was developed and its  performance characteristics determined by [facility]. It has not been cleared or approved by the Food and Drug Administration. Note:  Values greater than or equal to 75.0 nmol/L may       indicate an independent risk factor for CHD,       but must be evaluated with caution when applied       to non-Caucasian populations due to the       influence of genetic factors on Lp(a) across       ethnicities. Performed At: [facility] [address] [place], [place] [id number] Jennette Shorter MD Ey:[id number]   Lactic acid, plasma     Status: Abnormal   Collection Time: 11/09/23  5:51 AM  Result Value Ref Range   Lactic Acid, Venous 2.0 (HH) 0.5 - 1.9 mmol/L    Comment: CRITICAL RESULT CALLED TO, READ BACK BY AND VERIFIED WITH ABIGAIL FOLKES 11/09/23 9380 MW Performed at [facility], [address] [place], [place] [address]   Glucose, capillary     Status: Abnormal   Collection Time: 11/09/23  7:40 AM  Result Value Ref Range   Glucose-Capillary 257 (H) 70 - 99 mg/dL    Comment: Glucose reference range applies only to samples taken after fasting for at least 8 hours.  ECHOCARDIOGRAM COMPLETE     Status: None   Collection Time: 11/09/23  8:45 AM  Result Value Ref Range   Weight 2,878.33 oz   Height 69 in   BP 83/58 mmHg   Ao pk vel 1.07 m/s   AV Area VTI 2.88 cm2   AR max vel 2.53 cm2   AV Mean grad 2.0 mmHg   AV Peak grad 4.6 mmHg   Single Plane A2C EF 56.6 %   Single Plane A4C EF 65.8 %   Calc EF 61.3 %   S' Lateral 2.70 cm   AV Area mean vel 2.24 cm2   Area-P 1/2 2.93 cm2   MV VTI 2.06 cm2  Est EF 60 - 65%   Lactic acid, plasma     Status: None   Collection Time: 11/09/23  9:08 AM  Result Value Ref Range   Lactic Acid, Venous 1.7 0.5 - 1.9 mmol/L    Comment: Performed at [facility], [address] [place], [place] [address]  LDL cholesterol, direct     Status: None   Collection Time: 11/09/23  9:08 AM  Result Value Ref Range   Direct LDL 81  0 - 99 mg/dL    Comment: (NOTE) Performed At: [facility] [address] [place], [place] [id number] Jennette Shorter MD Ey:[id number]   Glucose, capillary     Status: Abnormal   Collection Time: 11/09/23 11:02 AM  Result Value Ref Range   Glucose-Capillary 229 (H) 70 - 99 mg/dL    Comment: Glucose reference range applies only to samples taken after fasting for at least 8 hours.  Glucose, capillary     Status: Abnormal   Collection Time: 11/09/23  4:20 PM  Result Value Ref Range   Glucose-Capillary 183 (H) 70 - 99 mg/dL    Comment: Glucose reference range applies only to samples taken after fasting for at least 8 hours.  Glucose, capillary     Status: Abnormal   Collection Time: 11/09/23  9:36 PM  Result Value Ref Range   Glucose-Capillary 165 (H) 70 - 99 mg/dL    Comment: Glucose reference range applies only to samples taken after fasting for at least 8 hours.  CBC     Status: Abnormal   Collection Time: 11/10/23  5:35 AM  Result Value Ref Range   WBC 12.7 (H) 4.0 - 10.5 K/uL   RBC 4.13 (L) 4.22 - 5.81 MIL/uL   Hemoglobin 12.1 (L) 13.0 - 17.0 g/dL   HCT 64.1 (L) 60.9 - 47.9 %   MCV 86.7 80.0 - 100.0 fL   MCH 29.3 26.0 - 34.0 pg   MCHC 33.8 30.0 - 36.0 g/dL   RDW 87.0 88.4 - 84.4 %   Platelets 203 150 - 400 K/uL   nRBC 0.0 0.0 - 0.2 %    Comment: Performed at [facility], [address] [place], [place] [address]  Comprehensive metabolic panel     Status: Abnormal   Collection Time: 11/10/23  5:35 AM  Result Value Ref Range   Sodium 140 135 - 145 mmol/L   Potassium 3.2 (L) 3.5 - 5.1 mmol/L   Chloride 108 98 - 111 mmol/L   CO2 23 22 - 32 mmol/L   Glucose, Bld 131 (H) 70 - 99 mg/dL    Comment: Glucose reference range applies only to samples taken after fasting for at least 8 hours.   BUN 11 6 - 20 mg/dL   Creatinine, Ser 9.42 (L) 0.61 - 1.24 mg/dL   Calcium  8.1 (L) 8.9 - 10.3 mg/dL   Total Protein 5.9 (L) 6.5 - 8.1 g/dL   Albumin 2.9 (L) 3.5 - 5.0 g/dL    AST 868 (H) 15 - 41 U/L   ALT 40 0 - 44 U/L   Alkaline Phosphatase 67 38 - 126 U/L   Total Bilirubin 0.6 0.0 - 1.2 mg/dL   GFR, Estimated >39 >39 mL/min    Comment: (NOTE) Calculated using the CKD-EPI Creatinine Equation (2021)    Anion gap 9 5 - 15    Comment: Performed at [facility], [address] [place], [place] [address]  Glucose, capillary     Status: Abnormal  Collection Time: 11/10/23 12:25 PM  Result Value Ref Range   Glucose-Capillary 105 (H) 70 - 99 mg/dL    Comment: Glucose reference range applies only to samples taken after fasting for at least 8 hours.  Glucose, capillary     Status: None   Collection Time: 11/10/23  4:29 PM  Result Value Ref Range   Glucose-Capillary 85 70 - 99 mg/dL    Comment: Glucose reference range applies only to samples taken after fasting for at least 8 hours.  Glucose, capillary     Status: Abnormal   Collection Time: 11/10/23  8:39 PM  Result Value Ref Range   Glucose-Capillary 197 (H) 70 - 99 mg/dL    Comment: Glucose reference range applies only to samples taken after fasting for at least 8 hours.  Basic metabolic panel with GFR     Status: Abnormal   Collection Time: 11/11/23  5:53 AM  Result Value Ref Range   Sodium 138 135 - 145 mmol/L   Potassium 2.9 (L) 3.5 - 5.1 mmol/L   Chloride 105 98 - 111 mmol/L   CO2 23 22 - 32 mmol/L   Glucose, Bld 164 (H) 70 - 99 mg/dL    Comment: Glucose reference range applies only to samples taken after fasting for at least 8 hours.   BUN 11 6 - 20 mg/dL   Creatinine, Ser 9.30 0.61 - 1.24 mg/dL   Calcium  8.2 (L) 8.9 - 10.3 mg/dL   GFR, Estimated >39 >39 mL/min    Comment: (NOTE) Calculated using the CKD-EPI Creatinine Equation (2021)    Anion gap 10 5 - 15    Comment: Performed at [facility], [address] [place], [place] [address]  CBC     Status: Abnormal   Collection Time: 11/11/23  5:53 AM  Result Value Ref Range   WBC 11.4 (H) 4.0 - 10.5 K/uL   RBC 4.08 (L)  4.22 - 5.81 MIL/uL   Hemoglobin 11.8 (L) 13.0 - 17.0 g/dL   HCT 64.7 (L) 60.9 - 47.9 %   MCV 86.3 80.0 - 100.0 fL   MCH 28.9 26.0 - 34.0 pg   MCHC 33.5 30.0 - 36.0 g/dL   RDW 87.2 88.4 - 84.4 %   Platelets 192 150 - 400 K/uL   nRBC 0.0 0.0 - 0.2 %    Comment: Performed at [facility], [address] [place], [place] [address]  Glucose, capillary     Status: Abnormal   Collection Time: 11/11/23  7:49 AM  Result Value Ref Range   Glucose-Capillary 153 (H) 70 - 99 mg/dL    Comment: Glucose reference range applies only to samples taken after fasting for at least 8 hours.  POCT CBG (Fasting - Glucose)     Status: Abnormal   Collection Time: 11/14/23  1:40 PM  Result Value Ref Range   Glucose Fasting, POC 209 (A) 70 - 99 mg/dL  CBC with Diff     Status: Abnormal   Collection Time: 11/14/23  2:35 PM  Result Value Ref Range   WBC 13.7 (H) 3.4 - 10.8 x10E3/uL   RBC 5.09 4.14 - 5.80 x10E6/uL   Hemoglobin 15.3 13.0 - 17.7 g/dL   Hematocrit 55.0 62.4 - 51.0 %   MCV 88 79 - 97 fL   MCH 30.1 26.6 - 33.0 pg   MCHC 34.1 31.5 - 35.7 g/dL   RDW 87.4 88.3 - 84.5 %   Platelets 330 150 - 450 x10E3/uL  Neutrophils 72 Not Estab. %   Lymphs 18 Not Estab. %   Monocytes 7 Not Estab. %   Eos 1 Not Estab. %   Basos 1 Not Estab. %   Neutrophils Absolute 10.1 (H) 1.4 - 7.0 x10E3/uL   Lymphocytes Absolute 2.4 0.7 - 3.1 x10E3/uL   Monocytes Absolute 0.9 0.1 - 0.9 x10E3/uL   EOS (ABSOLUTE) 0.2 0.0 - 0.4 x10E3/uL   Basophils Absolute 0.1 0.0 - 0.2 x10E3/uL   Immature Granulocytes 1 Not Estab. %   Immature Grans (Abs) 0.1 0.0 - 0.1 x10E3/uL  Basic metabolic panel with GFR     Status: Abnormal   Collection Time: 11/14/23  2:35 PM  Result Value Ref Range   Glucose 175 (H) 70 - 99 mg/dL   BUN 9 6 - 24 mg/dL   Creatinine, Ser 9.01 0.76 - 1.27 mg/dL   eGFR 91 >40 fO/fpw/8.26   BUN/Creatinine Ratio 9 9 - 20   Sodium 136 134 - 144 mmol/L   Potassium 3.7 3.5 - 5.2 mmol/L   Chloride 97 96 - 106  mmol/L   CO2 20 20 - 29 mmol/L   Calcium  9.7 8.7 - 10.2 mg/dL  POCT CBG (Fasting - Glucose)     Status: Abnormal   Collection Time: 11/28/23  1:04 PM  Result Value Ref Range   Glucose Fasting, POC 242 (A) 70 - 99 mg/dL      Assessment & Plan:  Start Symbicort 2 puffs twice a day. Refill for Albuterol  as needed. Increase Wellbutrin to 300 mg daily. Reinforced smoking cessation. Reinforced strict diet control and exercise as tolerated. Recommend patient keep specialist appointments as scheduled. Problem List Items Addressed This Visit     Poorly controlled type 2 diabetes mellitus with peripheral neuropathy (HCC)   Relevant Medications   buPROPion (WELLBUTRIN XL) 300 MG 24 hr tablet   Continuous Glucose Sensor (DEXCOM G7 SENSOR) MISC   Insulin  Pen Needle (TRUEPLUS 5-BEVEL PEN NEEDLES) 32G X 4 MM MISC   Other Relevant Orders   POCT CBG (Fasting - Glucose) (Completed)   Nicotine  abuse   Relevant Medications   buPROPion (WELLBUTRIN XL) 300 MG 24 hr tablet   Hypertension associated with diabetes (HCC)   Combined hyperlipidemia associated with type 2 diabetes mellitus (HCC) - Primary   COPD with acute exacerbation (HCC)   Relevant Medications   albuterol  (PROAIR  HFA) 108 (90 Base) MCG/ACT inhaler   budesonide-formoterol  (SYMBICORT) 160-4.5 MCG/ACT inhaler   Overweight with body mass index (BMI) of 26 to 26.9 in adult    Return in about 3 weeks (around 12/19/2023).   Total time spent: 25 minutes. This time includes review of previous notes and results and patient face to face interaction during today's visit.    FERNAND FREDY RAMAN, MD  11/28/2023   This document may have been prepared by [facility] Voice Recognition software and as such may include unintentional dictation errors.

## 2023-11-29 ENCOUNTER — Encounter

## 2023-11-29 ENCOUNTER — Other Ambulatory Visit: Payer: Self-pay

## 2023-11-29 VITALS — Ht 69.6 in | Wt 177.6 lb

## 2023-11-29 DIAGNOSIS — I213 ST elevation (STEMI) myocardial infarction of unspecified site: Secondary | ICD-10-CM | POA: Diagnosis not present

## 2023-11-29 DIAGNOSIS — Z48812 Encounter for surgical aftercare following surgery on the circulatory system: Secondary | ICD-10-CM | POA: Diagnosis not present

## 2023-11-29 DIAGNOSIS — Z955 Presence of coronary angioplasty implant and graft: Secondary | ICD-10-CM | POA: Diagnosis not present

## 2023-11-29 LAB — LIPID PANEL W/O CHOL/HDL RATIO
Cholesterol, Total: 134 mg/dL (ref 100–199)
HDL: 29 mg/dL — ABNORMAL LOW (ref >39)
LDL Chol Calc (NIH): 81 mg/dL (ref 0–99)
Triglycerides: 133 mg/dL (ref 0–149)
VLDL Cholesterol Cal: 24 mg/dL (ref 5–40)

## 2023-11-29 LAB — CMP14+EGFR
ALT: 16 IU/L (ref 0–44)
AST: 15 IU/L (ref 0–40)
Albumin: 4 g/dL (ref 3.8–4.9)
Alkaline Phosphatase: 90 IU/L (ref 47–123)
BUN/Creatinine Ratio: 11 (ref 9–20)
BUN: 10 mg/dL (ref 6–24)
Bilirubin Total: 0.5 mg/dL (ref 0.0–1.2)
CO2: 25 mmol/L (ref 20–29)
Calcium: 9 mg/dL (ref 8.7–10.2)
Chloride: 96 mmol/L (ref 96–106)
Creatinine, Ser: 0.93 mg/dL (ref 0.76–1.27)
Globulin, Total: 2.3 g/dL (ref 1.5–4.5)
Glucose: 195 mg/dL — ABNORMAL HIGH (ref 70–99)
Potassium: 3.3 mmol/L — ABNORMAL LOW (ref 3.5–5.2)
Sodium: 139 mmol/L (ref 134–144)
Total Protein: 6.3 g/dL (ref 6.0–8.5)
eGFR: 96 mL/min/1.73 (ref >59)

## 2023-11-29 LAB — HEMOGLOBIN A1C
Est. average glucose Bld gHb Est-mCnc: 232 mg/dL
Hgb A1c MFr Bld: 9.7 % — ABNORMAL HIGH (ref 4.8–5.6)

## 2023-11-29 LAB — TSH+T4F+T3FREE
Free T4: 1.41 ng/dL (ref 0.82–1.77)
T3, Free: 3.8 pg/mL (ref 2.0–4.4)
TSH: 1.71 u[IU]/mL (ref 0.450–4.500)

## 2023-11-29 NOTE — Patient Instructions (Signed)
 Patient Instructions  Patient Details  Name: Henry Anderson MRN: [id number] Date of Birth: Jun 01, 1965 Referring Provider:  Fernand Denyse LABOR, MD  Below are your personal goals for exercise, nutrition, and risk factors. Our goal is to help you stay on track towards obtaining and maintaining these goals. We will be discussing your progress on these goals with you throughout the program.  Initial Exercise Prescription:  Initial Exercise Prescription - 11/29/23 1500       Date of Initial Exercise RX and Referring Provider   Date 11/29/23    Referring Provider Fernand Denyse, MD      Oxygen   Maintain Oxygen Saturation 88% or higher      Recumbant Bike   Level 2    RPM 50    Watts 15    Minutes 15    METs 3.39      NuStep   Level 2    SPM 80    Minutes 15    METs 3.39      REL-XR   Level 1    Speed 50    Minutes 15    METs 3.39      T5 Nustep   Level 2    SPM 80    Minutes 15    METs 3.39      Biostep-RELP   Level 2    SPM 50    Minutes 15    METs 3.39      Track   Laps 21    Minutes 15    METs 2.14      Prescription Details   Frequency (times per week) 3    Duration Progress to 30 minutes of continuous aerobic without signs/symptoms of physical distress      Intensity   THRR 40-80% of Max Heartrate 120-149    Ratings of Perceived Exertion 11-13    Perceived Dyspnea 0-4      Progression   Progression Continue to progress workloads to maintain intensity without signs/symptoms of physical distress.      Resistance Training   Training Prescription Yes    Weight 5 lb    Reps 10-15          Exercise Goals: Frequency: Be able to perform aerobic exercise two to three times per week in program working toward 2-5 days per week of home exercise.  Intensity: Work with a perceived exertion of 11 (fairly light) - 15 (hard) while following your exercise prescription.  We will make changes to your prescription with you as you progress through the program.    Duration: Be able to do 30 to 45 minutes of continuous aerobic exercise in addition to a 5 minute warm-up and a 5 minute cool-down routine.   Nutrition Goals: Your personal nutrition goals will be established when you do your nutrition analysis with the dietician.  The following are general nutrition guidelines to follow: Cholesterol < 200mg /day Sodium < 1500mg /day Fiber: Men over 50 yrs - 30 grams per day  Personal Goals:  Personal Goals and Risk Factors at Admission - 11/29/23 1553       Core Components/Risk Factors/Patient Goals on Admission    Weight Management Weight Maintenance;Yes    Intervention Weight Management: Develop a combined nutrition and exercise program designed to reach desired caloric intake, while maintaining appropriate intake of nutrient and fiber, sodium and fats, and appropriate energy expenditure required for the weight goal.;Weight Management: Provide education and appropriate resources to help participant work on and attain dietary goals.;Weight  Management/Obesity: Establish reasonable short term and long term weight goals.    Admit Weight 177 lb 9.6 oz (80.6 kg)    Goal Weight: Short Term 177 lb 9.6 oz (80.6 kg)    Goal Weight: Long Term 177 lb 9.6 oz (80.6 kg)    Expected Outcomes Short Term: Continue to assess and modify interventions until short term weight is achieved;Understanding recommendations for meals to include 15-35% energy as protein, 25-35% energy from fat, 35-60% energy from carbohydrates, less than 200mg  of dietary cholesterol, 20-35 gm of total fiber daily;Understanding of distribution of calorie intake throughout the day with the consumption of 4-5 meals/snacks;Weight Maintenance: Understanding of the daily nutrition guidelines, which includes 25-35% calories from fat, 7% or less cal from saturated fats, less than 200mg  cholesterol, less than 1.5gm of sodium, & 5 or more servings of fruits and vegetables daily;Long Term: Adherence to nutrition and  physical activity/exercise program aimed toward attainment of established weight goal    Tobacco Cessation Yes    Number of packs per day 1.5    Intervention Assist the participant in steps to quit. Provide individualized education and counseling about committing to Tobacco Cessation, relapse prevention, and pharmacological support that can be provided by physician.;Education officer, environmental, assist with locating and accessing local/national Quit Smoking programs, and support quit date choice.    Expected Outcomes Short Term: Will demonstrate readiness to quit, by selecting a quit date.;Long Term: Complete abstinence from all tobacco products for at least 12 months from quit date.;Short Term: Will quit all tobacco product use, adhering to prevention of relapse plan.    Diabetes Yes    Intervention Provide education about signs/symptoms and action to take for hypo/hyperglycemia.;Provide education about proper nutrition, including hydration, and aerobic/resistive exercise prescription along with prescribed medications to achieve blood glucose in normal ranges: Fasting glucose 65-99 mg/dL    Expected Outcomes Short Term: Participant verbalizes understanding of the signs/symptoms and immediate care of hyper/hypoglycemia, proper foot care and importance of medication, aerobic/resistive exercise and nutrition plan for blood glucose control.;Long Term: Attainment of HbA1C < 7%.    Hypertension Yes    Intervention Provide education on lifestyle modifcations including regular physical activity/exercise, weight management, moderate sodium restriction and increased consumption of fresh fruit, vegetables, and low fat dairy, alcohol moderation, and smoking cessation.;Monitor prescription use compliance.    Expected Outcomes Short Term: Continued assessment and intervention until BP is < 140/63mm HG in hypertensive participants. < 130/66mm HG in hypertensive participants with diabetes, heart failure or chronic  kidney disease.;Long Term: Maintenance of blood pressure at goal levels.    Lipids Yes    Intervention Provide education and support for participant on nutrition & aerobic/resistive exercise along with prescribed medications to achieve LDL 70mg , HDL >40mg .    Expected Outcomes Short Term: Participant states understanding of desired cholesterol values and is compliant with medications prescribed. Participant is following exercise prescription and nutrition guidelines.;Long Term: Cholesterol controlled with medications as prescribed, with individualized exercise RX and with personalized nutrition plan. Value goals: LDL < 70mg , HDL > 40 mg.          Tobacco Use Initial Evaluation: Social History   Tobacco Use  Smoking Status Every Day   Current packs/day: 1.50   Average packs/day: 1.5 packs/day for 30.0 years (45.0 ttl pk-yrs)   Types: Cigarettes  Smokeless Tobacco Former   Types: Chew  Tobacco Comments   Patient has cut down to ~1 ppd since having stroke. Gave [facility] Quit info. Patient down to 1/2  ppd    Exercise Goals and Review:  Exercise Goals     Row Name 11/29/23 1552             Exercise Goals   Increase Physical Activity Yes       Intervention Provide advice, education, support and counseling about physical activity/exercise needs.;Develop an individualized exercise prescription for aerobic and resistive training based on initial evaluation findings, risk stratification, comorbidities and participant's personal goals.       Expected Outcomes Short Term: Attend rehab on a regular basis to increase amount of physical activity.;Long Term: Add in home exercise to make exercise part of routine and to increase amount of physical activity.;Long Term: Exercising regularly at least 3-5 days a week.       Increase Strength and Stamina Yes       Intervention Provide advice, education, support and counseling about physical activity/exercise needs.;Develop an individualized exercise  prescription for aerobic and resistive training based on initial evaluation findings, risk stratification, comorbidities and participant's personal goals.       Expected Outcomes Short Term: Perform resistance training exercises routinely during rehab and add in resistance training at home;Short Term: Increase workloads from initial exercise prescription for resistance, speed, and METs.;Long Term: Improve cardiorespiratory fitness, muscular endurance and strength as measured by increased METs and functional capacity ( )       Able to understand and use rate of perceived exertion (RPE) scale Yes       Intervention Provide education and explanation on how to use RPE scale       Expected Outcomes Short Term: Able to use RPE daily in rehab to express subjective intensity level;Long Term:  Able to use RPE to guide intensity level when exercising independently       Able to understand and use Dyspnea scale Yes       Intervention Provide education and explanation on how to use Dyspnea scale       Expected Outcomes Short Term: Able to use Dyspnea scale daily in rehab to express subjective sense of shortness of breath during exertion;Long Term: Able to use Dyspnea scale to guide intensity level when exercising independently       Knowledge and understanding of Target Heart Rate Range (THRR) Yes       Intervention Provide education and explanation of THRR including how the numbers were predicted and where they are located for reference       Expected Outcomes Short Term: Able to state/look up THRR;Short Term: Able to use daily as guideline for intensity in rehab;Long Term: Able to use THRR to govern intensity when exercising independently       Able to check pulse independently Yes       Intervention Provide education and demonstration on how to check pulse in carotid and radial arteries.;Review the importance of being able to check your own pulse for safety during independent exercise       Expected Outcomes  Short Term: Able to explain why pulse checking is important during independent exercise;Long Term: Able to check pulse independently and accurately       Understanding of Exercise Prescription Yes       Intervention Provide education, explanation, and written materials on patient's individual exercise prescription       Expected Outcomes Short Term: Able to explain program exercise prescription;Long Term: Able to explain home exercise prescription to exercise independently

## 2023-11-29 NOTE — Progress Notes (Signed)
 Cardiac Individual Treatment Plan  Patient Details  Name: Henry Anderson MRN: [id number] Date of Birth: Aug 10, 1965 Referring Provider:   Flowsheet Row Cardiac Rehab from 11/29/2023 in [facility] Cardiac and Pulmonary Rehab  Referring Provider Fernand Alter, MD    Initial Encounter Date:  Flowsheet Row Cardiac Rehab from 11/29/2023 in [facility] Cardiac and Pulmonary Rehab  Date 11/29/23    Visit Diagnosis: ST elevation myocardial infarction (STEMI), unspecified artery [facility])  Status post coronary artery stent placement  Patient's Home Medications on Admission:  Current Outpatient Medications:    acetaminophen  (TYLENOL ) 325 MG tablet, Take 2 tablets (650 mg total) by mouth every 4 (four) hours as needed for mild pain (or temp > 37.5 C (99.5 F))., Disp: , Rfl:    albuterol  (PROAIR  HFA) 108 (90 Base) MCG/ACT inhaler, Inhale 2 puffs into the lungs every 6 (six) hours as needed for wheezing or shortness of breath., Disp: 18 g, Rfl: 2   aspirin  EC (SM ASPIRIN  ADULT LOW STRENGTH) 81 MG tablet, Take 1 tablet (81 mg total) by mouth daily., Disp: 90 tablet, Rfl: 3   atorvastatin  (LIPITOR) 80 MG tablet, Take 1 tablet (80 mg total) by mouth daily., Disp: 90 tablet, Rfl: 4   baclofen  (LIORESAL ) 10 MG tablet, Take 1 tablet (10 mg total) by mouth daily., Disp: 30 tablet, Rfl: 1   blood glucose meter kit and supplies KIT, Use as directed, Disp: 1 each, Rfl: 0   budesonide-formoterol  (SYMBICORT) 160-4.5 MCG/ACT inhaler, Inhale 2 puffs into the lungs in the morning and at bedtime., Disp: 10.2 g, Rfl: 12   buPROPion (WELLBUTRIN XL) 300 MG 24 hr tablet, Take 1 tablet (300 mg total) by mouth every morning., Disp: 30 tablet, Rfl: 2   chlorthalidone  (HYGROTON ) 25 MG tablet, Take 1 tablet (25 mg total) by mouth once daily., Disp: 90 tablet, Rfl: 2   clopidogrel  (PLAVIX ) 75 MG tablet, Take 1 tablet (75 mg total) by mouth once daily., Disp: 90 tablet, Rfl: 4   Continuous Glucose Sensor (DEXCOM G7 SENSOR) MISC, Use as  directed. Change every 10 days ., Disp: 5 each, Rfl: 2   gabapentin  (NEURONTIN ) 100 MG capsule, Take 1 capsule (100 mg total) by mouth at bedtime., Disp: 90 capsule, Rfl: 2   glucose blood (ACCU-CHEK GUIDE TEST) test strip, Use to check blood glucose up to 4 times a day, Disp: 100 each, Rfl: 3   Insulin  Glargine (BASAGLAR  KWIKPEN) 100 UNIT/ML, Inject 45 Units into the skin daily., Disp: 30 mL, Rfl: 6   insulin  lispro (HUMALOG  KWIKPEN) 200 UNIT/ML KwikPen, No insulin  if blood sugar less than 150, Inject 3 units if 151-200, inject 6 units if 201-250, inject 9 units if 251-300, inject 12 units if 301-350. Sliding scale with 12 units/day max, Disp: 3 mL, Rfl: 0   Insulin  Pen Needle (TRUEPLUS 5-BEVEL PEN NEEDLES) 32G X 4 MM MISC, Inject 1 Needle with Basaglar  as directed daily., Disp: 100 each, Rfl: 2   Insulin  Pen Needle 31G X 6 MM MISC, 1 each by Does not apply route 2 (two) times daily as needed., Disp: 100 each, Rfl: 1   losartan (COZAAR) 25 MG tablet, Take 1 tablet (25 mg total) by mouth daily., Disp: 90 tablet, Rfl: 1   metFORMIN  (GLUCOPHAGE -XR) 500 MG 24 hr tablet, Take 2 tablets (1,000 mg total) by mouth daily with breakfast., Disp: 180 tablet, Rfl: 1   metoprolol succinate (TOPROL-XL) 25 MG 24 hr tablet, Take 0.5 tablets (12.5 mg total) by mouth daily., Disp: 90 tablet,  Rfl: 3   nicotine  (NICODERM CQ  - DOSED IN MG/24 HOURS) 21 mg/24hr patch, Place 1 patch (21 mg total) onto the skin daily., Disp: 28 patch, Rfl: 0   nitroGLYCERIN (NITROSTAT) 0.4 MG SL tablet, Place 1 tablet (0.4 mg total) under the tongue every 5 (five) minutes as needed for chest pain., Disp: 25 tablet, Rfl: 12   ondansetron  (ZOFRAN ) 4 MG tablet, Take 1 tablet (4 mg total) by mouth every 8 (eight) hours as needed for nausea or vomiting., Disp: 20 tablet, Rfl: 0   pantoprazole  (PROTONIX ) 40 MG tablet, Take 1 tablet (40 mg total) by mouth daily., Disp: 90 tablet, Rfl: 0   Rightest GL300 Lancets MISC, Use up to four times daily as  directed to check blood sugar., Disp: 100 each, Rfl: 11  Past Medical History: Past Medical History:  Diagnosis Date   Diabetes mellitus without complication (HCC)    Emphysema of lung (HCC)    Hypertension    Stroke (HCC)     Tobacco Use: Social History   Tobacco Use  Smoking Status Every Day   Current packs/day: 1.50   Average packs/day: 1.5 packs/day for 30.0 years (45.0 ttl pk-yrs)   Types: Cigarettes  Smokeless Tobacco Former   Types: Chew  Tobacco Comments   Patient has cut down to ~1 ppd since having stroke. Gave [place] Quit info. Patient down to 1/2 ppd    Labs: Review Flowsheet  More data exists      Latest Ref Rng & Units 04/14/2023 07/27/2023 11/08/2023 11/09/2023 11/28/2023  Labs for ITP Cardiac and Pulmonary Rehab  Cholestrol 100 - 199 mg/dL 898  863  782  - 865   LDL (calc) 0 - 99 mg/dL 57  74  UNABLE TO CALCULATE IF TRIGLYCERIDE OVER 400 mg/dL  - 81   Direct LDL 0 - 99 mg/dL - - - 81  -  HDL-C >60 mg/dL 25  33  33  - 29   Trlycerides 0 - 149 mg/dL 898  832  349  - 866   Hemoglobin A1c 4.8 - 5.6 % 7.6  7.7  9.7  - 9.7      Exercise Target Goals: Exercise Program Goal: Individual exercise prescription set using results from initial 6 min walk test and THRR while considering  patient's activity barriers and safety.   Exercise Prescription Goal: Initial exercise prescription builds to 30-45 minutes a day of aerobic activity, 2-3 days per week.  Home exercise guidelines will be given to patient during program as part of exercise prescription that the participant will acknowledge.   Education: Aerobic Exercise: - Group verbal and visual presentation on the components of exercise prescription. Introduces F.I.T.T principle from ACSM for exercise prescriptions.  Reviews F.I.T.T. principles of aerobic exercise including progression. Written material provided at class time.   Education: Resistance Exercise: - Group verbal and visual presentation on the components of  exercise prescription. Introduces F.I.T.T principle from ACSM for exercise prescriptions  Reviews F.I.T.T. principles of resistance exercise including progression. Written material provided at class time.    Education: Exercise & Equipment Safety: - Individual verbal instruction and demonstration of equipment use and safety with use of the equipment. Flowsheet Row Cardiac Rehab from 11/29/2023 in [facility] Cardiac and Pulmonary Rehab  Date 11/29/23  Educator MB  Instruction Review Code 1- Verbalizes Understanding    Education: Exercise Physiology & General Exercise Guidelines: - Group verbal and written instruction with models to review the exercise physiology of the cardiovascular system and associated  critical values. Provides general exercise guidelines with specific guidelines to those with heart or lung disease. Written material provided at class time.   Education: Flexibility, Balance, Mind/Body Relaxation: - Group verbal and visual presentation with interactive activity on the components of exercise prescription. Introduces F.I.T.T principle from ACSM for exercise prescriptions. Reviews F.I.T.T. principles of flexibility and balance exercise training including progression. Also discusses the mind body connection.  Reviews various relaxation techniques to help reduce and manage stress (i.e. Deep breathing, progressive muscle relaxation, and visualization). Balance handout provided to take home. Written material provided at class time.   Activity Barriers & Risk Stratification:  Activity Barriers & Cardiac Risk Stratification - 11/29/23 1547       Activity Barriers & Cardiac Risk Stratification   Activity Barriers Balance Concerns;Joint Problems;Arthritis;Assistive Device   HX of rotator cuff sugery in L shoulder, arthritis in back and possibly R hip, soreness from catherization in R arm, uses cane out in public   Cardiac Risk Stratification Moderate          6 Minute Walk:  6 Minute  Walk     Row Name 11/29/23 1545         6 Minute Walk   Phase Initial     Distance 945 feet     Walk Time 6 minutes     # of Rest Breaks 0     MPH 1.79     METS 3.39     RPE 15     Perceived Dyspnea  2     VO2 Peak 11.87     Symptoms Yes (comment)     Comments R hip pain 4/10, Bilateral calf burning pain 7/10     Resting HR 93 bpm     Resting BP 106/66     Resting Oxygen Saturation  96 %     Exercise Oxygen Saturation  during 6 min walk 98 %     Max Ex. HR 105 bpm     Max Ex. BP 150/78     2 Minute Post BP 130/70        Oxygen Initial Assessment:   Oxygen Re-Evaluation:   Oxygen Discharge (Final Oxygen Re-Evaluation):   Initial Exercise Prescription:  Initial Exercise Prescription - 11/29/23 1500       Date of Initial Exercise RX and Referring Provider   Date 11/29/23    Referring Provider Fernand Alter, MD      Oxygen   Maintain Oxygen Saturation 88% or higher      Recumbant Bike   Level 2    RPM 50    Watts 15    Minutes 15    METs 3.39      NuStep   Level 2    SPM 80    Minutes 15    METs 3.39      REL-XR   Level 1    Speed 50    Minutes 15    METs 3.39      T5 Nustep   Level 2    SPM 80    Minutes 15    METs 3.39      Biostep-RELP   Level 2    SPM 50    Minutes 15    METs 3.39      Track   Laps 21    Minutes 15    METs 2.14      Prescription Details   Frequency (times per week) 3    Duration Progress to 30  minutes of continuous aerobic without signs/symptoms of physical distress      Intensity   THRR 40-80% of Max Heartrate 120-149    Ratings of Perceived Exertion 11-13    Perceived Dyspnea 0-4      Progression   Progression Continue to progress workloads to maintain intensity without signs/symptoms of physical distress.      Resistance Training   Training Prescription Yes    Weight 5 lb    Reps 10-15          Perform Capillary Blood Glucose checks as needed.  Exercise Prescription Changes:   Exercise  Prescription Changes     Row Name 11/29/23 1500             Response to Exercise   Blood Pressure (Admit) 106/66       Blood Pressure (Exercise) 150/78       Blood Pressure (Exit) 130/70       Heart Rate (Admit) 93 bpm       Heart Rate (Exercise) 105 bpm       Heart Rate (Exit) 91 bpm       Oxygen Saturation (Admit) 96 %       Oxygen Saturation (Exercise) 98 %       Oxygen Saturation (Exit) 97 %       Rating of Perceived Exertion (Exercise) 15       Perceived Dyspnea (Exercise) 2       Symptoms R hip pain 4/10, bilateral calf burning pain 7/10       Comments results         Progression   Average METs 3.39          Exercise Comments:   Exercise Goals and Review:   Exercise Goals     Row Name 11/29/23 1552             Exercise Goals   Increase Physical Activity Yes       Intervention Provide advice, education, support and counseling about physical activity/exercise needs.;Develop an individualized exercise prescription for aerobic and resistive training based on initial evaluation findings, risk stratification, comorbidities and participant's personal goals.       Expected Outcomes Short Term: Attend rehab on a regular basis to increase amount of physical activity.;Long Term: Add in home exercise to make exercise part of routine and to increase amount of physical activity.;Long Term: Exercising regularly at least 3-5 days a week.       Increase Strength and Stamina Yes       Intervention Provide advice, education, support and counseling about physical activity/exercise needs.;Develop an individualized exercise prescription for aerobic and resistive training based on initial evaluation findings, risk stratification, comorbidities and participant's personal goals.       Expected Outcomes Short Term: Perform resistance training exercises routinely during rehab and add in resistance training at home;Short Term: Increase workloads from initial exercise prescription for  resistance, speed, and METs.;Long Term: Improve cardiorespiratory fitness, muscular endurance and strength as measured by increased METs and functional capacity ( )       Able to understand and use rate of perceived exertion (RPE) scale Yes       Intervention Provide education and explanation on how to use RPE scale       Expected Outcomes Short Term: Able to use RPE daily in rehab to express subjective intensity level;Long Term:  Able to use RPE to guide intensity level when exercising independently       Able to  understand and use Dyspnea scale Yes       Intervention Provide education and explanation on how to use Dyspnea scale       Expected Outcomes Short Term: Able to use Dyspnea scale daily in rehab to express subjective sense of shortness of breath during exertion;Long Term: Able to use Dyspnea scale to guide intensity level when exercising independently       Knowledge and understanding of Target Heart Rate Range (THRR) Yes       Intervention Provide education and explanation of THRR including how the numbers were predicted and where they are located for reference       Expected Outcomes Short Term: Able to state/look up THRR;Short Term: Able to use daily as guideline for intensity in rehab;Long Term: Able to use THRR to govern intensity when exercising independently       Able to check pulse independently Yes       Intervention Provide education and demonstration on how to check pulse in carotid and radial arteries.;Review the importance of being able to check your own pulse for safety during independent exercise       Expected Outcomes Short Term: Able to explain why pulse checking is important during independent exercise;Long Term: Able to check pulse independently and accurately       Understanding of Exercise Prescription Yes       Intervention Provide education, explanation, and written materials on patient's individual exercise prescription       Expected Outcomes Short Term: Able to  explain program exercise prescription;Long Term: Able to explain home exercise prescription to exercise independently          Exercise Goals Re-Evaluation :   Discharge Exercise Prescription (Final Exercise Prescription Changes):  Exercise Prescription Changes - 11/29/23 1500       Response to Exercise   Blood Pressure (Admit) 106/66    Blood Pressure (Exercise) 150/78    Blood Pressure (Exit) 130/70    Heart Rate (Admit) 93 bpm    Heart Rate (Exercise) 105 bpm    Heart Rate (Exit) 91 bpm    Oxygen Saturation (Admit) 96 %    Oxygen Saturation (Exercise) 98 %    Oxygen Saturation (Exit) 97 %    Rating of Perceived Exertion (Exercise) 15    Perceived Dyspnea (Exercise) 2    Symptoms R hip pain 4/10, bilateral calf burning pain 7/10    Comments results      Progression   Average METs 3.39          Nutrition:  Target Goals: Understanding of nutrition guidelines, daily intake of sodium 1500mg , cholesterol 200mg , calories 30% from fat and 7% or less from saturated fats, daily to have 5 or more servings of fruits and vegetables.  Education: Nutrition 1 -Group instruction provided by verbal, written material, interactive activities, discussions, models, and posters to present general guidelines for heart healthy nutrition including macronutrients, label reading, and promoting whole foods over processed counterparts. Education serves as pensions consultant of discussion of heart healthy eating for all. Written material provided at class time.    Education: Nutrition 2 -Group instruction provided by verbal, written material, interactive activities, discussions, models, and posters to present general guidelines for heart healthy nutrition including sodium, cholesterol, and saturated fat. Providing guidance of habit forming to improve blood pressure, cholesterol, and body weight. Written material provided at class time.     Biometrics:  Pre Biometrics - 11/29/23 1552       Pre  Biometrics  Height 5' 9.6 (1.768 m)    Weight 177 lb 9.6 oz (80.6 kg)    Waist Circumference 44.1 inches    Hip Circumference 38 inches    Waist to Hip Ratio 1.16 %    BMI (Calculated) 25.77    Single Leg Stand 30 seconds           Nutrition Therapy Plan and Nutrition Goals:  Nutrition Therapy & Goals - 11/29/23 1553       Personal Nutrition Goals   Nutrition Goal Will meet w/ RD on 12/1      Intervention Plan   Intervention Prescribe, educate and counsel regarding individualized specific dietary modifications aiming towards targeted core components such as weight, hypertension, lipid management, diabetes, heart failure and other comorbidities.    Expected Outcomes Short Term Goal: Understand basic principles of dietary content, such as calories, fat, sodium, cholesterol and nutrients.          Nutrition Assessments:  MEDIFICTS Score Key: >=70 Need to make dietary changes  40-70 Heart Healthy Diet <= 40 Therapeutic Level Cholesterol Diet   Picture Your Plate Scores: <59 Unhealthy dietary pattern with much room for improvement. 41-50 Dietary pattern unlikely to meet recommendations for good health and room for improvement. 51-60 More healthful dietary pattern, with some room for improvement.  >60 Healthy dietary pattern, although there may be some specific behaviors that could be improved.    Nutrition Goals Re-Evaluation:   Nutrition Goals Discharge (Final Nutrition Goals Re-Evaluation):   Psychosocial: Target Goals: Acknowledge presence or absence of significant depression and/or stress, maximize coping skills, provide positive support system. Participant is able to verbalize types and ability to use techniques and skills needed for reducing stress and depression.   Education: Stress, Anxiety, and Depression - Group verbal and visual presentation to define topics covered.  Reviews how body is impacted by stress, anxiety, and depression.  Also discusses healthy  ways to reduce stress and to treat/manage anxiety and depression. Written material provided at class time.   Education: Sleep Hygiene -Provides group verbal and written instruction about how sleep can affect your health.  Define sleep hygiene, discuss sleep cycles and impact of sleep habits. Review good sleep hygiene tips.   Initial Review & Psychosocial Screening:  Initial Psych Review & Screening - 11/27/23 1432       Initial Review   Current issues with Current Anxiety/Panic      Family Dynamics   Good Support System? Yes    Comments He takes some medication to help his anxiety. He can look to his wife, son , daughter and grandson for support.      Barriers   Psychosocial barriers to participate in program The patient should benefit from training in stress management and relaxation.;There are no identifiable barriers or psychosocial needs.      Screening Interventions   Interventions Encouraged to exercise;Provide feedback about the scores to participant;To provide support and resources with identified psychosocial needs    Expected Outcomes Short Term goal: Utilizing psychosocial counselor, staff and physician to assist with identification of specific Stressors or current issues interfering with healing process. Setting desired goal for each stressor or current issue identified.;Long Term Goal: Stressors or current issues are controlled or eliminated.;Short Term goal: Identification and review with participant of any Quality of Life or Depression concerns found by scoring the questionnaire.;Long Term goal: The participant improves quality of Life and PHQ9 Scores as seen by post scores and/or verbalization of changes  Quality of Life Scores:   Scores of 19 and below usually indicate a poorer quality of life in these areas.  A difference of  2-3 points is a clinically meaningful difference.  A difference of 2-3 points in the total score of the Quality of Life Index has been  associated with significant improvement in overall quality of life, self-image, physical symptoms, and general health in studies assessing change in quality of life.  PHQ-9: Review Flowsheet  More data exists      11/29/2023 01/13/2023 10/27/2022 11/23/2021 11/03/2020  Depression screen PHQ 2/9  Decreased Interest 0 0 3 3 1   Down, Depressed, Hopeless 0 0 0 3 1  PHQ - 2 Score 0 0 3 6 2   Altered sleeping 3 2 - 2 0  Tired, decreased energy 3 2 - 2 0  Change in appetite 3 0 - 2 0  Feeling bad or failure about yourself  0 0 - 3 1  Trouble concentrating 0 0 - 3 0  Moving slowly or fidgety/restless 0 0 - 2 0  Suicidal thoughts 0 0 - 0 0  PHQ-9 Score 9 4  - 20  3   Difficult doing work/chores Somewhat difficult Not difficult at all - - Not difficult at all    Details       Data saved with a previous flowsheet row definition        Interpretation of Total Score  Total Score Depression Severity:  1-4 = Minimal depression, 5-9 = Mild depression, 10-14 = Moderate depression, 15-19 = Moderately severe depression, 20-27 = Severe depression   Psychosocial Evaluation and Intervention:  Psychosocial Evaluation - 11/27/23 1433       Psychosocial Evaluation & Interventions   Interventions Encouraged to exercise with the program and follow exercise prescription;Relaxation education;Stress management education    Comments He takes some medication to help his anxiety. He can look to his wife, son , daughter and grandson for support.    Expected Outcomes Short: Start [facility] to help with mood. Long: Maintain a healthy mental state    Continue Psychosocial Services  Follow up required by staff          Psychosocial Re-Evaluation:   Psychosocial Discharge (Final Psychosocial Re-Evaluation):   Vocational Rehabilitation: Provide vocational rehab assistance to qualifying candidates.   Vocational Rehab Evaluation & Intervention:   Education: Education Goals: Education classes will be  provided on a variety of topics geared toward better understanding of heart health and risk factor modification. Participant will state understanding/return demonstration of topics presented as noted by education test scores.  Learning Barriers/Preferences:  Learning Barriers/Preferences - 11/27/23 1432       Learning Barriers/Preferences   Learning Barriers None    Learning Preferences None          General Cardiac Education Topics:  AED/CPR: - Group verbal and written instruction with the use of models to demonstrate the basic use of the AED with the basic ABC's of resuscitation.   Test and Procedures: - Group verbal and visual presentation and models provide information about basic cardiac anatomy and function. Reviews the testing methods done to diagnose heart disease and the outcomes of the test results. Describes the treatment choices: Medical Management, Angioplasty, or Coronary Bypass Surgery for treating various heart conditions including Myocardial Infarction, Angina, Valve Disease, and Cardiac Arrhythmias. Written material provided at class time.   Medication Safety: - Group verbal and visual instruction to review commonly prescribed medications for heart and lung disease. Reviews  the medication, class of the drug, and side effects. Includes the steps to properly store meds and maintain the prescription regimen. Written material provided at class time.   Intimacy: - Group verbal instruction through game format to discuss how heart and lung disease can affect sexual intimacy. Written material provided at class time.   Know Your Numbers and Heart Failure: - Group verbal and visual instruction to discuss disease risk factors for cardiac and pulmonary disease and treatment options.  Reviews associated critical values for Overweight/Obesity, Hypertension, Cholesterol, and Diabetes.  Discusses basics of heart failure: signs/symptoms and treatments.  Introduces Heart Failure Zone  chart for action plan for heart failure. Written material provided at class time.   Infection Prevention: - Provides verbal and written material to individual with discussion of infection control including proper hand washing and proper equipment cleaning during exercise session. Flowsheet Row Cardiac Rehab from 11/29/2023 in [facility] Cardiac and Pulmonary Rehab  Date 11/29/23  Educator MB  Instruction Review Code 1- Verbalizes Understanding    Falls Prevention: - Provides verbal and written material to individual with discussion of falls prevention and safety. Flowsheet Row Cardiac Rehab from 11/29/2023 in [facility] Cardiac and Pulmonary Rehab  Date 11/29/23  Educator MB  Instruction Review Code 1- Verbalizes Understanding    Other: -Provides group and verbal instruction on various topics (see comments)   Knowledge Questionnaire Score:   Core Components/Risk Factors/Patient Goals at Admission:  Personal Goals and Risk Factors at Admission - 11/29/23 1553       Core Components/Risk Factors/Patient Goals on Admission    Weight Management Weight Maintenance;Yes    Intervention Weight Management: Develop a combined nutrition and exercise program designed to reach desired caloric intake, while maintaining appropriate intake of nutrient and fiber, sodium and fats, and appropriate energy expenditure required for the weight goal.;Weight Management: Provide education and appropriate resources to help participant work on and attain dietary goals.;Weight Management/Obesity: Establish reasonable short term and long term weight goals.    Admit Weight 177 lb 9.6 oz (80.6 kg)    Goal Weight: Short Term 177 lb 9.6 oz (80.6 kg)    Goal Weight: Long Term 177 lb 9.6 oz (80.6 kg)    Expected Outcomes Short Term: Continue to assess and modify interventions until short term weight is achieved;Understanding recommendations for meals to include 15-35% energy as protein, 25-35% energy from fat, 35-60% energy from  carbohydrates, less than 200mg  of dietary cholesterol, 20-35 gm of total fiber daily;Understanding of distribution of calorie intake throughout the day with the consumption of 4-5 meals/snacks;Weight Maintenance: Understanding of the daily nutrition guidelines, which includes 25-35% calories from fat, 7% or less cal from saturated fats, less than 200mg  cholesterol, less than 1.5gm of sodium, & 5 or more servings of fruits and vegetables daily;Long Term: Adherence to nutrition and physical activity/exercise program aimed toward attainment of established weight goal    Tobacco Cessation Yes    Number of packs per day 1.5    Intervention Assist the participant in steps to quit. Provide individualized education and counseling about committing to Tobacco Cessation, relapse prevention, and pharmacological support that can be provided by physician.;Education officer, environmental, assist with locating and accessing local/national Quit Smoking programs, and support quit date choice.    Expected Outcomes Short Term: Will demonstrate readiness to quit, by selecting a quit date.;Long Term: Complete abstinence from all tobacco products for at least 12 months from quit date.;Short Term: Will quit all tobacco product use, adhering to prevention of relapse  plan.    Diabetes Yes    Intervention Provide education about signs/symptoms and action to take for hypo/hyperglycemia.;Provide education about proper nutrition, including hydration, and aerobic/resistive exercise prescription along with prescribed medications to achieve blood glucose in normal ranges: Fasting glucose 65-99 mg/dL    Expected Outcomes Short Term: Participant verbalizes understanding of the signs/symptoms and immediate care of hyper/hypoglycemia, proper foot care and importance of medication, aerobic/resistive exercise and nutrition plan for blood glucose control.;Long Term: Attainment of HbA1C < 7%.    Hypertension Yes    Intervention Provide education on  lifestyle modifcations including regular physical activity/exercise, weight management, moderate sodium restriction and increased consumption of fresh fruit, vegetables, and low fat dairy, alcohol moderation, and smoking cessation.;Monitor prescription use compliance.    Expected Outcomes Short Term: Continued assessment and intervention until BP is < 140/67mm HG in hypertensive participants. < 130/27mm HG in hypertensive participants with diabetes, heart failure or chronic kidney disease.;Long Term: Maintenance of blood pressure at goal levels.    Lipids Yes    Intervention Provide education and support for participant on nutrition & aerobic/resistive exercise along with prescribed medications to achieve LDL 70mg , HDL >40mg .    Expected Outcomes Short Term: Participant states understanding of desired cholesterol values and is compliant with medications prescribed. Participant is following exercise prescription and nutrition guidelines.;Long Term: Cholesterol controlled with medications as prescribed, with individualized exercise RX and with personalized nutrition plan. Value goals: LDL < 70mg , HDL > 40 mg.          Education:Diabetes - Individual verbal and written instruction to review signs/symptoms of diabetes, desired ranges of glucose level fasting, after meals and with exercise. Acknowledge that pre and post exercise glucose checks will be done for 3 sessions at entry of program. Flowsheet Row Cardiac Rehab from 11/29/2023 in [facility]  Date 11/29/23  Educator MB  Instruction Review Code 1- Verbalizes Understanding    Core Components/Risk Factors/Patient Goals Review:    Core Components/Risk Factors/Patient Goals at Discharge (Final Review):    ITP Comments:  ITP Comments     Row Name 11/27/23 1434 11/29/23 1543         ITP Comments Virtual Visit completed. Patient informed on EP and RD appointment and 6 Minute walk test. Patient also informed of patient  health questionnaires on My Chart. Patient Verbalizes understanding. Visit diagnosis can be found in [facility] 11/08/[facility]23. Completed and gym orientation for cardiac rehab. Initial ITP created and sent for review to Dr. Oneil Pinal, Medical Director. Henry Anderson is a current tobacco user. Intervention for tobacco cessation was provided at the initial medical review. He was asked about readiness to quit and reported that he is slowly decreasing his amount of cigarettes and is currently down to 12-14 a day with the help of wellbutrin . He does not have a set quit date, but would like to by the new year. Patient was advised and educated about tobacco cessation using combination therapy, tobacco cessation classes, quit line, and quit smoking apps. Patient demonstrated understanding of this material. Staff will continue to provide encouragement and follow up with the patient throughout the program.         Comments: Initial ITP

## 2023-11-30 ENCOUNTER — Other Ambulatory Visit: Payer: Self-pay

## 2023-11-30 ENCOUNTER — Ambulatory Visit: Payer: Self-pay | Admitting: Internal Medicine

## 2023-11-30 DIAGNOSIS — E876 Hypokalemia: Secondary | ICD-10-CM

## 2023-11-30 MED ORDER — POTASSIUM CHLORIDE CRYS ER 10 MEQ PO TBCR
EXTENDED_RELEASE_TABLET | ORAL | 3 refills | Status: AC
  Filled 2023-11-30: qty 100, 30d supply, fill #0
  Filled 2023-12-27: qty 100, 85d supply, fill #1

## 2023-12-01 NOTE — Progress Notes (Signed)
 Patient notified

## 2023-12-02 ENCOUNTER — Other Ambulatory Visit: Payer: Self-pay

## 2023-12-02 ENCOUNTER — Other Ambulatory Visit: Payer: Self-pay | Admitting: Cardiology

## 2023-12-02 DIAGNOSIS — K219 Gastro-esophageal reflux disease without esophagitis: Secondary | ICD-10-CM

## 2023-12-02 MED FILL — Insulin Glargine Soln Pen-Injector 100 Unit/ML: SUBCUTANEOUS | 66 days supply | Qty: 30 | Fill #2 | Status: AC

## 2023-12-02 MED FILL — Metformin HCl Tab ER 24HR 500 MG: ORAL | 90 days supply | Qty: 180 | Fill #1 | Status: AC

## 2023-12-03 ENCOUNTER — Other Ambulatory Visit: Payer: Self-pay

## 2023-12-04 ENCOUNTER — Other Ambulatory Visit: Payer: Self-pay

## 2023-12-04 MED ORDER — PANTOPRAZOLE SODIUM 40 MG PO TBEC
40.0000 mg | DELAYED_RELEASE_TABLET | Freq: Every day | ORAL | 3 refills | Status: AC
  Filled 2023-12-04: qty 90, 90d supply, fill #0

## 2023-12-05 ENCOUNTER — Other Ambulatory Visit: Payer: Self-pay

## 2023-12-05 ENCOUNTER — Encounter: Payer: Self-pay | Admitting: Internal Medicine

## 2023-12-05 ENCOUNTER — Ambulatory Visit (INDEPENDENT_AMBULATORY_CARE_PROVIDER_SITE_OTHER): Admitting: Internal Medicine

## 2023-12-05 VITALS — BP 124/84 | HR 96 | Ht 69.0 in | Wt 175.6 lb

## 2023-12-05 DIAGNOSIS — E1142 Type 2 diabetes mellitus with diabetic polyneuropathy: Secondary | ICD-10-CM

## 2023-12-05 DIAGNOSIS — E782 Mixed hyperlipidemia: Secondary | ICD-10-CM | POA: Diagnosis not present

## 2023-12-05 DIAGNOSIS — E663 Overweight: Secondary | ICD-10-CM

## 2023-12-05 DIAGNOSIS — I152 Hypertension secondary to endocrine disorders: Secondary | ICD-10-CM

## 2023-12-05 DIAGNOSIS — Z6825 Body mass index (BMI) 25.0-25.9, adult: Secondary | ICD-10-CM | POA: Insufficient documentation

## 2023-12-05 DIAGNOSIS — E876 Hypokalemia: Secondary | ICD-10-CM

## 2023-12-05 DIAGNOSIS — E1159 Type 2 diabetes mellitus with other circulatory complications: Secondary | ICD-10-CM

## 2023-12-05 DIAGNOSIS — I639 Cerebral infarction, unspecified: Secondary | ICD-10-CM

## 2023-12-05 DIAGNOSIS — E1165 Type 2 diabetes mellitus with hyperglycemia: Secondary | ICD-10-CM

## 2023-12-05 DIAGNOSIS — E1169 Type 2 diabetes mellitus with other specified complication: Secondary | ICD-10-CM

## 2023-12-05 DIAGNOSIS — F17210 Nicotine dependence, cigarettes, uncomplicated: Secondary | ICD-10-CM

## 2023-12-05 LAB — POCT CBG (FASTING - GLUCOSE)-MANUAL ENTRY: Glucose Fasting, POC: 225 mg/dL — AB (ref 70–99)

## 2023-12-05 MED ORDER — TRUEPLUS 5-BEVEL PEN NEEDLES 32G X 4 MM MISC
2 refills | Status: AC
  Filled 2023-12-05: qty 100, 100d supply, fill #0
  Filled 2023-12-12: qty 100, 90d supply, fill #0
  Filled 2024-02-08: qty 100, 90d supply, fill #1

## 2023-12-05 NOTE — Progress Notes (Signed)
 Established Patient Office Visit  Subjective:  Patient ID: Henry Anderson, male    DOB: Jan 19, 1965  Age: 58 y.o. MRN: [id number]  Chief Complaint  Patient presents with   Follow-up    Patient is here today for follow up. He reports feeling well today.  Since increasing his insulin , patient reports blood sugar range 120-200s. He reports he has not had blood sugar readings in the 300s since increasing his glargine insulin . He reports taking Glargine insulin  in the morning and using sliding scale only at bedtime. Recommend patient to use sliding scale at lunch and dinner. Patient educated to check blood sugar 3 times a day; fasting in the morning, before lunch and before dinner time. Recommend patient to return glargine insulin  to bedtime dosing. He should be using sliding scale insulin  twice a day with lunch and dinner if blood sugar is above 150.  Patient reports he has been drinking more water  and less sodas. Reinforced healthy diet and exercise as tolerated. Patient reports starting cardiac rehab and has not been having any chest pain. He reports he has been having balance difficulty and they are working with him to build his muscle strength back and walking with a cane. Patient requesting handicap parking pass. Due to patients cardiac hx and CAD will send referral to vascular.    No other concerns at this time.   Past Medical History:  Diagnosis Date   Diabetes mellitus without complication (HCC)    Emphysema of lung (HCC)    Hypertension    Stroke [facility])     Past Surgical History:  Procedure Laterality Date   CORONARY/GRAFT ACUTE MI REVASCULARIZATION N/A 11/08/2023   Procedure: Coronary/Graft Acute MI Revascularization;  Surgeon: Ammon Blunt, MD;  Location: [facility] INVASIVE CV LAB;  Service: Cardiovascular;  Laterality: N/A;   FOOT SURGERY Right    KNEE SURGERY Right    LEFT HEART CATH AND CORONARY ANGIOGRAPHY N/A 11/08/2023   Procedure: LEFT HEART CATH AND CORONARY  ANGIOGRAPHY;  Surgeon: Ammon Blunt, MD;  Location: [facility] INVASIVE CV LAB;  Service: Cardiovascular;  Laterality: N/A;   SHOULDER SURGERY Left     Social History   Socioeconomic History   Marital status: Married    Spouse name: Not on file   Number of children: Not on file   Years of education: Not on file   Highest education level: Not on file  Occupational History   Not on file  Tobacco Use   Smoking status: Every Day    Current packs/day: 1.50    Average packs/day: 1.5 packs/day for 30.0 years (45.0 ttl pk-yrs)    Types: Cigarettes   Smokeless tobacco: Former    Types: Chew   Tobacco comments:    Patient has cut down to ~1 ppd since having stroke. Gave [place] Quit info. Patient down to 1/2 ppd  Vaping Use   Vaping status: Never Used  Substance and Sexual Activity   Alcohol use: Yes    Comment: occasionally 1-2 beers   Drug use: Never   Sexual activity: Not on file  Other Topics Concern   Not on file  Social History Narrative   Not on file   Social Drivers of Health   Financial Resource Strain: High Risk (11/23/2021)   Overall Financial Resource Strain (CARDIA)    Difficulty of Paying Living Expenses: Very hard  Food Insecurity: No Food Insecurity (11/13/2023)   Hunger Vital Sign    Worried About Running Out of Food in the Last Year: Never  true    Ran Out of Food in the Last Year: Never true  Transportation Needs: No Transportation Needs (11/13/2023)   PRAPARE - Administrator, Civil Service (Medical): No    Lack of Transportation (Non-Medical): No  Physical Activity: Inactive (11/23/2021)   Exercise Vital Sign    Days of Exercise per Week: 0 days    Minutes of Exercise per Session: 0 min  Stress: Stress Concern Present (11/23/2021)   [employer] of Occupational Health - Occupational Stress Questionnaire    Feeling of Stress : Very much  Social Connections: Moderately Isolated (11/09/2023)   Social Connection and Isolation Panel     Frequency of Communication with Friends and Family: More than three times a week    Frequency of Social Gatherings with Friends and Family: Twice a week    Attends Religious Services: Never    Database Administrator or Organizations: No    Attends Banker Meetings: Never    Marital Status: Married  Catering Manager Violence: Not At Risk (11/13/2023)   Humiliation, Afraid, Rape, and Kick questionnaire    Fear of Current or Ex-Partner: No    Emotionally Abused: No    Physically Abused: No    Sexually Abused: No    Family History  Problem Relation Age of Onset   COPD Mother    Diabetes Father    Diabetes Sister    Diabetes Sister    COPD Maternal Grandmother    Heart attack Maternal Grandfather    Diabetes Paternal Grandmother    Hyperlipidemia Paternal Grandmother    Hypertension Paternal Grandmother    Diabetes Paternal Grandfather    Hyperlipidemia Paternal Grandfather    Hypertension Paternal Grandfather     No Known Allergies  Outpatient Medications Prior to Visit  Medication Sig   acetaminophen  (TYLENOL ) 325 MG tablet Take 2 tablets (650 mg total) by mouth every 4 (four) hours as needed for mild pain (or temp > 37.5 C (99.5 F)).   albuterol  (PROAIR  HFA) 108 (90 Base) MCG/ACT inhaler Inhale 2 puffs into the lungs every 6 (six) hours as needed for wheezing or shortness of breath.   aspirin  EC (SM ASPIRIN  ADULT LOW STRENGTH) 81 MG tablet Take 1 tablet (81 mg total) by mouth daily.   atorvastatin  (LIPITOR ) 80 MG tablet Take 1 tablet (80 mg total) by mouth daily.   baclofen  (LIORESAL ) 10 MG tablet Take 1 tablet (10 mg total) by mouth daily.   blood glucose meter kit and supplies KIT Use as directed   budesonide -formoterol  (SYMBICORT ) 160-4.5 MCG/ACT inhaler Inhale 2 puffs into the lungs in the morning and at bedtime.   buPROPion  (WELLBUTRIN  XL) 300 MG 24 hr tablet Take 1 tablet (300 mg total) by mouth every morning.   chlorthalidone  (HYGROTON ) 25 MG tablet Take 1  tablet (25 mg total) by mouth once daily.   clopidogrel  (PLAVIX ) 75 MG tablet Take 1 tablet (75 mg total) by mouth once daily.   Continuous Glucose Sensor (DEXCOM G7 SENSOR) MISC Use as directed. Change every 10 days .   gabapentin  (NEURONTIN ) 100 MG capsule Take 1 capsule (100 mg total) by mouth at bedtime.   glucose blood (ACCU-CHEK GUIDE TEST) test strip Use to check blood glucose up to 4 times a day   Insulin  Glargine (BASAGLAR  KWIKPEN) 100 UNIT/ML Inject 45 Units into the skin daily. (Patient taking differently: Inject 50 Units into the skin daily.)   insulin  lispro (HUMALOG  KWIKPEN) 200 UNIT/ML KwikPen No insulin   if blood sugar less than 150, Inject 3 units if 151-200, inject 6 units if 201-250, inject 9 units if 251-300, inject 12 units if 301-350. Sliding scale with 12 units/day max   losartan  (COZAAR ) 25 MG tablet Take 1 tablet (25 mg total) by mouth daily.   metFORMIN  (GLUCOPHAGE -XR) 500 MG 24 hr tablet Take 2 tablets (1,000 mg total) by mouth daily with breakfast.   metoprolol  succinate (TOPROL -XL) 25 MG 24 hr tablet Take 0.5 tablets (12.5 mg total) by mouth daily.   nicotine  (NICODERM CQ  - DOSED IN MG/24 HOURS) 21 mg/24hr patch Place 1 patch (21 mg total) onto the skin daily.   nitroGLYCERIN  (NITROSTAT ) 0.4 MG SL tablet Place 1 tablet (0.4 mg total) under the tongue every 5 (five) minutes as needed for chest pain.   ondansetron  (ZOFRAN ) 4 MG tablet Take 1 tablet (4 mg total) by mouth every 8 (eight) hours as needed for nausea or vomiting.   pantoprazole  (PROTONIX ) 40 MG tablet Take 1 tablet (40 mg total) by mouth daily.   potassium chloride  (KLOR-CON  M) 10 MEQ tablet Take 2 tablets (20 mEq total) by mouth 3 (three) times daily for 3 days, THEN 1 tablet (10 mEq total) 3 (three) times daily for 27 days.   Rightest GL300 Lancets MISC Use up to four times daily as directed to check blood sugar.   [DISCONTINUED] Insulin  Pen Needle (TRUEPLUS 5-BEVEL PEN NEEDLES) 32G X 4 MM MISC Inject 1 Needle  with Basaglar  as directed daily.   [DISCONTINUED] Insulin  Pen Needle 31G X 6 MM MISC 1 each by Does not apply route 2 (two) times daily as needed.   No facility-administered medications prior to visit.    Review of Systems  Constitutional: Negative.  Negative for chills, fever and malaise/fatigue.  HENT: Negative.  Negative for congestion and sore throat.   Eyes: Negative.  Negative for blurred vision and pain.  Respiratory: Negative.  Negative for cough and shortness of breath.   Cardiovascular: Negative.  Negative for chest pain, palpitations and leg swelling.  Gastrointestinal: Negative.  Negative for abdominal pain, blood in stool, constipation, diarrhea, heartburn, melena, nausea and vomiting.  Genitourinary: Negative.  Negative for dysuria, flank pain, frequency and urgency.  Musculoskeletal: Negative.  Negative for joint pain and myalgias.  Skin: Negative.   Neurological:  Positive for weakness (walking with cane). Negative for dizziness, tingling, sensory change and headaches.  Endo/Heme/Allergies: Negative.   Psychiatric/Behavioral: Negative.  Negative for depression and suicidal ideas. The patient is not nervous/anxious.        Objective:   BP 124/84   Pulse 96   Ht 5' 9 (1.753 m)   Wt 175 lb 9.6 oz (79.7 kg)   SpO2 99%   BMI 25.93 kg/m   Vitals:   12/05/23 1051  BP: 124/84  Pulse: 96  Height: 5' 9 (1.753 m)  Weight: 175 lb 9.6 oz (79.7 kg)  SpO2: 99%  BMI (Calculated): 25.92    Physical Exam Vitals and nursing note reviewed.  Constitutional:      General: He is not in acute distress.    Appearance: Normal appearance. He is not ill-appearing.  HENT:     Head: Normocephalic and atraumatic.     Nose: Nose normal.     Mouth/Throat:     Mouth: Mucous membranes are moist.     Pharynx: Oropharynx is clear.  Eyes:     Conjunctiva/sclera: Conjunctivae normal.     Pupils: Pupils are equal, round, and reactive to light.  Cardiovascular:     Rate and Rhythm:  Normal rate and regular rhythm.     Pulses: Normal pulses.     Heart sounds: Normal heart sounds.  Pulmonary:     Effort: Pulmonary effort is normal.     Breath sounds: Normal breath sounds. No wheezing or rhonchi.  Abdominal:     General: Bowel sounds are normal. There is no distension.     Palpations: Abdomen is soft.     Tenderness: There is no abdominal tenderness.  Musculoskeletal:        General: Normal range of motion.     Cervical back: Normal range of motion and neck supple.     Right lower leg: No edema.     Left lower leg: No edema.  Skin:    General: Skin is warm and dry.     Capillary Refill: Capillary refill takes less than 2 seconds.  Neurological:     General: No focal deficit present.     Mental Status: He is alert and oriented to person, place, and time.     Sensory: No sensory deficit.     Motor: No weakness.  Psychiatric:        Mood and Affect: Mood normal.        Behavior: Behavior normal.        Judgment: Judgment normal.      Results for orders placed or performed in visit on 12/05/23  POCT CBG (Fasting - Glucose)  Result Value Ref Range   Glucose Fasting, POC 225 (A) 70 - 99 mg/dL    Recent Results (from the past 2160 hours)  POCT CBG (Fasting - Glucose)     Status: Abnormal   Collection Time: 09/18/23 11:10 AM  Result Value Ref Range   Glucose Fasting, POC 270 (A) 70 - 99 mg/dL  CBC with Differential/Platelet     Status: Abnormal   Collection Time: 11/08/23  6:44 PM  Result Value Ref Range   WBC 15.0 (H) 4.0 - 10.5 K/uL   RBC 5.56 4.22 - 5.81 MIL/uL   Hemoglobin 16.2 13.0 - 17.0 g/dL   HCT 52.4 60.9 - 47.9 %   MCV 85.4 80.0 - 100.0 fL   MCH 29.1 26.0 - 34.0 pg   MCHC 34.1 30.0 - 36.0 g/dL   RDW 87.5 88.4 - 84.4 %   Platelets 306 150 - 400 K/uL   nRBC 0.0 0.0 - 0.2 %   Neutrophils Relative % 67 %   Neutro Abs 10.1 (H) 1.7 - 7.7 K/uL   Lymphocytes Relative 25 %   Lymphs Abs 3.8 0.7 - 4.0 K/uL   Monocytes Relative 6 %   Monocytes  Absolute 0.8 0.1 - 1.0 K/uL   Eosinophils Relative 1 %   Eosinophils Absolute 0.1 0.0 - 0.5 K/uL   Basophils Relative 1 %   Basophils Absolute 0.1 0.0 - 0.1 K/uL   Immature Granulocytes 0 %   Abs Immature Granulocytes 0.06 0.00 - 0.07 K/uL    Comment: Performed at [facility], [address] [place], [place] [address]  Protime-INR     Status: None   Collection Time: 11/08/23  6:44 PM  Result Value Ref Range   Prothrombin Time 12.4 11.4 - 15.2 seconds   INR 0.9 0.8 - 1.2    Comment: (NOTE) INR goal varies based on device and disease states. Performed at [facility], [address] [place], [place] [address]   APTT     Status: Abnormal  Collection Time: 11/08/23  6:44 PM  Result Value Ref Range   aPTT 23 (L) 24 - 36 seconds    Comment: Performed at [facility], [address] [place], [place] [address]  Comprehensive metabolic panel     Status: Abnormal   Collection Time: 11/08/23  6:44 PM  Result Value Ref Range   Sodium 138 135 - 145 mmol/L   Potassium 4.0 3.5 - 5.1 mmol/L   Chloride 99 98 - 111 mmol/L   CO2 24 22 - 32 mmol/L   Glucose, Bld 432 (H) 70 - 99 mg/dL    Comment: Glucose reference range applies only to samples taken after fasting for at least 8 hours.   BUN 12 6 - 20 mg/dL   Creatinine, Ser 9.02 0.61 - 1.24 mg/dL   Calcium  9.2 8.9 - 10.3 mg/dL   Total Protein 7.7 6.5 - 8.1 g/dL   Albumin 4.2 3.5 - 5.0 g/dL   AST 25 15 - 41 U/L   ALT 26 0 - 44 U/L   Alkaline Phosphatase 89 38 - 126 U/L   Total Bilirubin 0.8 0.0 - 1.2 mg/dL   GFR, Estimated >39 >39 mL/min    Comment: (NOTE) Calculated using the CKD-EPI Creatinine Equation (2021)    Anion gap 15 5 - 15    Comment: Performed at [facility], [address] [place], [place] [address]  Troponin I (High Sensitivity)     Status: None   Collection Time: 11/08/23  6:44 PM  Result Value Ref Range   Troponin I (High Sensitivity) 2 <18 ng/L    Comment: (NOTE) Elevated  high sensitivity troponin I (hsTnI) values and significant  changes across serial measurements may suggest ACS but many other  chronic and acute conditions are known to elevate hsTnI results.  Refer to the Links section for chest pain algorithms and additional  guidance. Performed at [facility], [address] [place], [place] [address]   Lipid panel     Status: Abnormal   Collection Time: 11/08/23  6:44 PM  Result Value Ref Range   Cholesterol 217 (H) 0 - 200 mg/dL   Triglycerides 349 (H) <150 mg/dL   HDL 33 (L) >59 mg/dL   Total CHOL/HDL Ratio 6.6 RATIO   VLDL UNABLE TO CALCULATE IF TRIGLYCERIDE OVER 400 mg/dL 0 - 40 mg/dL   LDL Cholesterol UNABLE TO CALCULATE IF TRIGLYCERIDE OVER 400 mg/dL 0 - 99 mg/dL    Comment:        Total Cholesterol/HDL:CHD Risk Coronary Heart Disease Risk Table                     Men   Women  1/2 Average Risk   3.4   3.3  Average Risk       5.0   4.4  2 X Average Risk   9.6   7.1  3 X Average Risk  23.4   11.0        Use the calculated Patient Ratio above and the CHD Risk Table to determine the patient's CHD Risk.        ATP III CLASSIFICATION (LDL):  <100     mg/dL   Optimal  899-870  mg/dL   Near or Above                    Optimal  130-159  mg/dL   Borderline  839-810  mg/dL   High  >809  mg/dL   Very High Performed at [facility], [address] [place], [place] [address]   Miscellaneous [facility] test (send-out)     Status: None   Collection Time: 11/08/23  6:44 PM  Result Value Ref Range   [facility] test code [id number]    [facility] test name LOW DENSITY LIPOPROTEIN     Comment: Performed at [facility], [address] [place], [place] [address]   Misc [facility] result COMMENT     Comment: (NOTE) Test Ordered: 879704 LDL Cholesterol (Direct) LDL Chol. (Direct)             109         [H ] mg/dL    BN     Reference Range: 0-99                                  LDL Direct Comment:                                       BN   Performed At: [facility] [place] [address] [place], [place] [id number] Jennette Shorter MD Ey:[id number]   Hemoglobin A1c     Status: Abnormal   Collection Time: 11/08/23  6:44 PM  Result Value Ref Range   Hgb A1c MFr Bld 9.7 (H) 4.8 - 5.6 %    Comment: (NOTE) Diagnosis of Diabetes The following HbA1c ranges recommended by the American Diabetes Association (ADA) may be used as an aid in the diagnosis of diabetes mellitus.  Hemoglobin             Suggested A1C NGSP%              Diagnosis  <5.7                   Non Diabetic  5.7-6.4                Pre-Diabetic  >6.4                   Diabetic  <7.0                   Glycemic control for                       adults with diabetes.     Mean Plasma Glucose 231.69 mg/dL    Comment: Performed at [facility], [address], [place], [place] [address]  CG4 I-STAT (Lactic acid)     Status: None   Collection Time: 11/08/23  7:35 PM  Result Value Ref Range   Lactic Acid, Venous 0.6 0.5 - 1.9 mmol/L  POCT Activated clotting time     Status: None   Collection Time: 11/08/23  7:42 PM  Result Value Ref Range   Activated Clotting Time 781 seconds    Comment: Reference range 74-137 seconds for patients not on anticoagulant therapy.  POCT Activated clotting time     Status: None   Collection Time: 11/08/23  7:49 PM  Result Value Ref Range   Activated Clotting Time 475 seconds    Comment: Reference range 74-137 seconds for patients not on anticoagulant therapy.  Glucose, capillary     Status: Abnormal   Collection Time: 11/08/23  8:24 PM  Result Value Ref Range  Glucose-Capillary 382 (H) 70 - 99 mg/dL    Comment: Glucose reference range applies only to samples taken after fasting for at least 8 hours.  Troponin I (High Sensitivity)     Status: Abnormal   Collection Time: 11/08/23  9:22 PM  Result Value Ref Range   Troponin I (High Sensitivity) 18,962 (HH) <18 ng/L    Comment: CRITICAL RESULT CALLED TO,  READ BACK BY AND VERIFIED WITH ABIGALE FOWLKES @2157  ON 11/08/23 SKL (NOTE) Elevated high sensitivity troponin I (hsTnI) values and significant  changes across serial measurements may suggest ACS but many other  chronic and acute conditions are known to elevate hsTnI results.  Refer to the Links section for chest pain algorithms and additional  guidance. Performed at [facility], [address] [place], [place] [address]   Urine Drug Screen, Qualitative [facility] only)     Status: Abnormal   Collection Time: 11/08/23  9:28 PM  Result Value Ref Range   Tricyclic, Ur Screen NONE DETECTED NONE DETECTED   Amphetamines, Ur Screen POSITIVE (A) NONE DETECTED    Comment: (NOTE) Trazodone is metabolized in vivo to several metabolites, including pharmacologically active m-CPP, which is excreted in the urine. Immunoassay screens for amphetamines and MDMA have potential cross-reactivity with these compounds and may provide false positive  results.     MDMA (Ecstasy)Ur Screen NONE DETECTED NONE DETECTED   Cocaine Metabolite,Ur [place] NONE DETECTED NONE DETECTED   Opiate, Ur Screen NONE DETECTED NONE DETECTED   Phencyclidine (PCP) Ur S NONE DETECTED NONE DETECTED   Cannabinoid 50 Ng, Ur [place] NONE DETECTED NONE DETECTED   Barbiturates, Ur Screen NONE DETECTED NONE DETECTED   Benzodiazepine, Ur Scrn POSITIVE (A) NONE DETECTED   Methadone Scn, Ur NONE DETECTED NONE DETECTED    Comment: (NOTE) Tricyclics + metabolites, urine    Cutoff 1000 ng/mL Amphetamines + metabolites, urine  Cutoff 1000 ng/mL MDMA (Ecstasy), urine              Cutoff 500 ng/mL Cocaine Metabolite, urine          Cutoff 300 ng/mL Opiate + metabolites, urine        Cutoff 300 ng/mL Phencyclidine (PCP), urine         Cutoff 25 ng/mL Cannabinoid, urine                 Cutoff 50 ng/mL Barbiturates + metabolites, urine  Cutoff 200 ng/mL Benzodiazepine, urine              Cutoff 200 ng/mL Methadone, urine                    Cutoff 300 ng/mL  The urine drug screen provides only a preliminary, unconfirmed analytical test result and should not be used for non-medical purposes. Clinical consideration and professional judgment should be applied to any positive drug screen result due to possible interfering substances. A more specific alternate chemical method must be used in order to obtain a confirmed analytical result. Gas chromatography / mass spectrometry (GC/MS) is the preferred confirm atory method. Performed at [facility], [address] [place], [place] [address]   Glucose, capillary     Status: Abnormal   Collection Time: 11/08/23 11:57 PM  Result Value Ref Range   Glucose-Capillary 251 (H) 70 - 99 mg/dL    Comment: Glucose reference range applies only to samples taken after fasting for at least 8 hours.  CBC     Status: Abnormal  Collection Time: 11/09/23  5:15 AM  Result Value Ref Range   WBC 13.9 (H) 4.0 - 10.5 K/uL   RBC 4.72 4.22 - 5.81 MIL/uL   Hemoglobin 13.5 13.0 - 17.0 g/dL   HCT 59.2 60.9 - 47.9 %   MCV 86.2 80.0 - 100.0 fL   MCH 28.6 26.0 - 34.0 pg   MCHC 33.2 30.0 - 36.0 g/dL   RDW 87.5 88.4 - 84.4 %   Platelets 262 150 - 400 K/uL   nRBC 0.0 0.0 - 0.2 %    Comment: Performed at [facility], [address], [place], [place] [address]  Basic metabolic panel     Status: Abnormal   Collection Time: 11/09/23  5:15 AM  Result Value Ref Range   Sodium 139 135 - 145 mmol/L   Potassium 3.9 3.5 - 5.1 mmol/L   Chloride 104 98 - 111 mmol/L   CO2 25 22 - 32 mmol/L   Glucose, Bld 272 (H) 70 - 99 mg/dL    Comment: Glucose reference range applies only to samples taken after fasting for at least 8 hours.   BUN 13 6 - 20 mg/dL   Creatinine, Ser 9.12 0.61 - 1.24 mg/dL   Calcium  8.4 (L) 8.9 - 10.3 mg/dL   GFR, Estimated >39 >39 mL/min    Comment: (NOTE) Calculated using the CKD-EPI Creatinine Equation (2021)    Anion gap 10 5 - 15    Comment: Performed at [facility], [address] [place], [place] [address]  Lipoprotein A (LPA)     Status: None   Collection Time: 11/09/23  5:15 AM  Result Value Ref Range   Lipoprotein (a) <8.4 <75.0 nmol/L    Comment: (NOTE) **Results verified by repeat testing** This test was developed and its performance characteristics determined by [facility]. It has not been cleared or approved by the Food and Drug Administration. Note:  Values greater than or equal to 75.0 nmol/L may       indicate an independent risk factor for CHD,       but must be evaluated with caution when applied       to non-Caucasian populations due to the       influence of genetic factors on Lp(a) across       ethnicities. Performed At: [facility] [address] [place], [place] [id number] Jennette Shorter MD Ey:[id number]   Lactic acid, plasma     Status: Abnormal   Collection Time: 11/09/23  5:51 AM  Result Value Ref Range   Lactic Acid, Venous 2.0 (HH) 0.5 - 1.9 mmol/L    Comment: CRITICAL RESULT CALLED TO, READ BACK BY AND VERIFIED WITH ABIGAIL FOLKES 11/09/23 9380 MW Performed at [facility], [address] [place], [place] [address]   Glucose, capillary     Status: Abnormal   Collection Time: 11/09/23  7:40 AM  Result Value Ref Range   Glucose-Capillary 257 (H) 70 - 99 mg/dL    Comment: Glucose reference range applies only to samples taken after fasting for at least 8 hours.  ECHOCARDIOGRAM COMPLETE     Status: None   Collection Time: 11/09/23  8:45 AM  Result Value Ref Range   Weight 2,878.33 oz   Height 69 in   BP 83/58 mmHg   Ao pk vel 1.07 m/s   AV Area VTI 2.88 cm2   AR max vel 2.53 cm2   AV Mean grad 2.0 mmHg   AV Peak grad 4.6  mmHg   Single Plane A2C EF 56.6 %   Single Plane A4C EF 65.8 %   Calc EF 61.3 %   S' Lateral 2.70 cm   AV Area mean vel 2.24 cm2   Area-P 1/2 2.93 cm2   MV VTI 2.06 cm2   Est EF 60 - 65%   Lactic acid, plasma     Status: None   Collection Time: 11/09/23   9:08 AM  Result Value Ref Range   Lactic Acid, Venous 1.7 0.5 - 1.9 mmol/L    Comment: Performed at [facility], [address] [place], [place] [address]  LDL cholesterol, direct     Status: None   Collection Time: 11/09/23  9:08 AM  Result Value Ref Range   Direct LDL 81 0 - 99 mg/dL    Comment: (NOTE) Performed At: [facility] [address] [place], [place] [id number] Jennette Shorter MD Ey:[id number]   Glucose, capillary     Status: Abnormal   Collection Time: 11/09/23 11:02 AM  Result Value Ref Range   Glucose-Capillary 229 (H) 70 - 99 mg/dL    Comment: Glucose reference range applies only to samples taken after fasting for at least 8 hours.  Glucose, capillary     Status: Abnormal   Collection Time: 11/09/23  4:20 PM  Result Value Ref Range   Glucose-Capillary 183 (H) 70 - 99 mg/dL    Comment: Glucose reference range applies only to samples taken after fasting for at least 8 hours.  Glucose, capillary     Status: Abnormal   Collection Time: 11/09/23  9:36 PM  Result Value Ref Range   Glucose-Capillary 165 (H) 70 - 99 mg/dL    Comment: Glucose reference range applies only to samples taken after fasting for at least 8 hours.  CBC     Status: Abnormal   Collection Time: 11/10/23  5:35 AM  Result Value Ref Range   WBC 12.7 (H) 4.0 - 10.5 K/uL   RBC 4.13 (L) 4.22 - 5.81 MIL/uL   Hemoglobin 12.1 (L) 13.0 - 17.0 g/dL   HCT 64.1 (L) 60.9 - 47.9 %   MCV 86.7 80.0 - 100.0 fL   MCH 29.3 26.0 - 34.0 pg   MCHC 33.8 30.0 - 36.0 g/dL   RDW 87.0 88.4 - 84.4 %   Platelets 203 150 - 400 K/uL   nRBC 0.0 0.0 - 0.2 %    Comment: Performed at [facility], [address] [place], [place] [address]  Comprehensive metabolic panel     Status: Abnormal   Collection Time: 11/10/23  5:35 AM  Result Value Ref Range   Sodium 140 135 - 145 mmol/L   Potassium 3.2 (L) 3.5 - 5.1 mmol/L   Chloride 108 98 - 111 mmol/L   CO2 23 22 - 32 mmol/L   Glucose, Bld 131 (H) 70  - 99 mg/dL    Comment: Glucose reference range applies only to samples taken after fasting for at least 8 hours.   BUN 11 6 - 20 mg/dL   Creatinine, Ser 9.42 (L) 0.61 - 1.24 mg/dL   Calcium  8.1 (L) 8.9 - 10.3 mg/dL   Total Protein 5.9 (L) 6.5 - 8.1 g/dL   Albumin 2.9 (L) 3.5 - 5.0 g/dL   AST 868 (H) 15 - 41 U/L   ALT 40 0 - 44 U/L   Alkaline Phosphatase 67 38 - 126 U/L   Total Bilirubin 0.6 0.0 - 1.2 mg/dL   GFR,  Estimated >60 >60 mL/min    Comment: (NOTE) Calculated using the CKD-EPI Creatinine Equation (2021)    Anion gap 9 5 - 15    Comment: Performed at [facility], [address] [place], [place] [address]  Glucose, capillary     Status: Abnormal   Collection Time: 11/10/23 12:25 PM  Result Value Ref Range   Glucose-Capillary 105 (H) 70 - 99 mg/dL    Comment: Glucose reference range applies only to samples taken after fasting for at least 8 hours.  Glucose, capillary     Status: None   Collection Time: 11/10/23  4:29 PM  Result Value Ref Range   Glucose-Capillary 85 70 - 99 mg/dL    Comment: Glucose reference range applies only to samples taken after fasting for at least 8 hours.  Glucose, capillary     Status: Abnormal   Collection Time: 11/10/23  8:39 PM  Result Value Ref Range   Glucose-Capillary 197 (H) 70 - 99 mg/dL    Comment: Glucose reference range applies only to samples taken after fasting for at least 8 hours.  Basic metabolic panel with GFR     Status: Abnormal   Collection Time: 11/11/23  5:53 AM  Result Value Ref Range   Sodium 138 135 - 145 mmol/L   Potassium 2.9 (L) 3.5 - 5.1 mmol/L   Chloride 105 98 - 111 mmol/L   CO2 23 22 - 32 mmol/L   Glucose, Bld 164 (H) 70 - 99 mg/dL    Comment: Glucose reference range applies only to samples taken after fasting for at least 8 hours.   BUN 11 6 - 20 mg/dL   Creatinine, Ser 9.30 0.61 - 1.24 mg/dL   Calcium  8.2 (L) 8.9 - 10.3 mg/dL   GFR, Estimated >39 >39 mL/min    Comment: (NOTE) Calculated using  the CKD-EPI Creatinine Equation (2021)    Anion gap 10 5 - 15    Comment: Performed at [facility], [address] [place], [place] [address]  CBC     Status: Abnormal   Collection Time: 11/11/23  5:53 AM  Result Value Ref Range   WBC 11.4 (H) 4.0 - 10.5 K/uL   RBC 4.08 (L) 4.22 - 5.81 MIL/uL   Hemoglobin 11.8 (L) 13.0 - 17.0 g/dL   HCT 64.7 (L) 60.9 - 47.9 %   MCV 86.3 80.0 - 100.0 fL   MCH 28.9 26.0 - 34.0 pg   MCHC 33.5 30.0 - 36.0 g/dL   RDW 87.2 88.4 - 84.4 %   Platelets 192 150 - 400 K/uL   nRBC 0.0 0.0 - 0.2 %    Comment: Performed at [facility], [address] [place], [place] [address]  Glucose, capillary     Status: Abnormal   Collection Time: 11/11/23  7:49 AM  Result Value Ref Range   Glucose-Capillary 153 (H) 70 - 99 mg/dL    Comment: Glucose reference range applies only to samples taken after fasting for at least 8 hours.  POCT CBG (Fasting - Glucose)     Status: Abnormal   Collection Time: 11/14/23  1:40 PM  Result Value Ref Range   Glucose Fasting, POC 209 (A) 70 - 99 mg/dL  CBC with Diff     Status: Abnormal   Collection Time: 11/14/23  2:35 PM  Result Value Ref Range   WBC 13.7 (H) 3.4 - 10.8 x10E3/uL   RBC 5.09 4.14 - 5.80 x10E6/uL   Hemoglobin 15.3 13.0 -  17.7 g/dL   Hematocrit 55.0 62.4 - 51.0 %   MCV 88 79 - 97 fL   MCH 30.1 26.6 - 33.0 pg   MCHC 34.1 31.5 - 35.7 g/dL   RDW 87.4 88.3 - 84.5 %   Platelets 330 150 - 450 x10E3/uL   Neutrophils 72 Not Estab. %   Lymphs 18 Not Estab. %   Monocytes 7 Not Estab. %   Eos 1 Not Estab. %   Basos 1 Not Estab. %   Neutrophils Absolute 10.1 (H) 1.4 - 7.0 x10E3/uL   Lymphocytes Absolute 2.4 0.7 - 3.1 x10E3/uL   Monocytes Absolute 0.9 0.1 - 0.9 x10E3/uL   EOS (ABSOLUTE) 0.2 0.0 - 0.4 x10E3/uL   Basophils Absolute 0.1 0.0 - 0.2 x10E3/uL   Immature Granulocytes 1 Not Estab. %   Immature Grans (Abs) 0.1 0.0 - 0.1 x10E3/uL  Basic metabolic panel with GFR     Status: Abnormal   Collection  Time: 11/14/23  2:35 PM  Result Value Ref Range   Glucose 175 (H) 70 - 99 mg/dL   BUN 9 6 - 24 mg/dL   Creatinine, Ser 9.01 0.76 - 1.27 mg/dL   eGFR 91 >40 fO/fpw/8.26   BUN/Creatinine Ratio 9 9 - 20   Sodium 136 134 - 144 mmol/L   Potassium 3.7 3.5 - 5.2 mmol/L   Chloride 97 96 - 106 mmol/L   CO2 20 20 - 29 mmol/L   Calcium  9.7 8.7 - 10.2 mg/dL  [id number]     Status: Abnormal   Collection Time: 11/28/23  9:28 AM  Result Value Ref Range   Glucose 195 (H) 70 - 99 mg/dL   BUN 10 6 - 24 mg/dL   Creatinine, Ser 9.06 0.76 - 1.27 mg/dL   eGFR 96 >40 fO/fpw/8.26   BUN/Creatinine Ratio 11 9 - 20   Sodium 139 134 - 144 mmol/L   Potassium 3.3 (L) 3.5 - 5.2 mmol/L   Chloride 96 96 - 106 mmol/L   CO2 25 20 - 29 mmol/L   Calcium  9.0 8.7 - 10.2 mg/dL   Total Protein 6.3 6.0 - 8.5 g/dL   Albumin 4.0 3.8 - 4.9 g/dL   Globulin, Total 2.3 1.5 - 4.5 g/dL   Bilirubin Total 0.5 0.0 - 1.2 mg/dL   Alkaline Phosphatase 90 47 - 123 IU/L   AST 15 0 - 40 IU/L   ALT 16 0 - 44 IU/L  Lipid Panel w/o Chol/HDL Ratio     Status: Abnormal   Collection Time: 11/28/23  9:28 AM  Result Value Ref Range   Cholesterol, Total 134 100 - 199 mg/dL   Triglycerides 866 0 - 149 mg/dL   HDL 29 (L) >60 mg/dL   VLDL Cholesterol Cal 24 5 - 40 mg/dL   LDL Chol Calc (NIH) 81 0 - 99 mg/dL  Hemoglobin J8r     Status: Abnormal   Collection Time: 11/28/23  9:28 AM  Result Value Ref Range   Hgb A1c MFr Bld 9.7 (H) 4.8 - 5.6 %    Comment:          Prediabetes: 5.7 - 6.4          Diabetes: >6.4          Glycemic control for adults with diabetes: <7.0    Est. average glucose Bld gHb Est-mCnc 232 mg/dL  [id number]     Status: None   Collection Time: 11/28/23  9:35 AM  Result Value Ref Range   TSH  1.710 0.450 - 4.500 uIU/mL   T3, Free 3.8 2.0 - 4.4 pg/mL   Free T4 1.41 0.82 - 1.77 ng/dL  POCT CBG (Fasting - Glucose)     Status: Abnormal   Collection Time: 11/28/23  1:04 PM  Result Value Ref Range   Glucose Fasting,  POC 242 (A) 70 - 99 mg/dL  POCT CBG (Fasting - Glucose)     Status: Abnormal   Collection Time: 12/05/23 11:05 AM  Result Value Ref Range   Glucose Fasting, POC 225 (A) 70 - 99 mg/dL      Assessment & Plan:  Start insulin  Glargine at bedtime. Start using sliding scale at lunch and dinner. Continue other medications as prescribed. Referral to vascular sent to discuss ABS study. Check BMP today. Reinforced healthy diet and exercise as tolerated. Problem List Items Addressed This Visit     Poorly controlled type 2 diabetes mellitus with peripheral neuropathy (HCC) - Primary   Relevant Medications   Insulin  Pen Needle (TRUEPLUS 5-BEVEL PEN NEEDLES) 32G X 4 MM MISC   Other Relevant Orders   POCT CBG (Fasting - Glucose) (Completed)   Ambulatory referral to Vascular Surgery   Cerebrovascular accident (CVA) [facility])   Relevant Orders   Ambulatory referral to Vascular Surgery   Cigarette nicotine  dependence without complication   Relevant Orders   Ambulatory referral to Vascular Surgery   Hypertension associated with diabetes (HCC)   Relevant Orders   Basic metabolic panel with GFR   Combined hyperlipidemia associated with type 2 diabetes mellitus (HCC)   Hypokalemia   Relevant Orders   Basic metabolic panel with GFR   Overweight with body mass index (BMI) of 25 to 25.9 in adult    Return in about 4 weeks (around 01/02/2024).   Total time spent: 25 minutes. This time includes review of previous notes and results and patient face to face interaction during today's visit.    FERNAND FREDY RAMAN, MD  12/05/2023   This document may have been prepared by [facility] Voice Recognition software and as such may include unintentional dictation errors.

## 2023-12-06 NOTE — Progress Notes (Signed)
   12/06/2023  Patient ID: Henry Anderson Portal, male   DOB: [birth year], 58 y.o.   MRN: [id number]  Pharmacy Quality Measure Review  This patient is appearing on a report for being at risk of failing the adherence measure for cholesterol (statin) medications this calendar year.   Medication: Atorvastatin  Last fill date: 11/11/23 for 90 day supply  Insurance report was not up to date. No action needed at this time.   [email or web address]

## 2023-12-11 ENCOUNTER — Encounter

## 2023-12-12 ENCOUNTER — Other Ambulatory Visit: Payer: Self-pay

## 2023-12-12 ENCOUNTER — Ambulatory Visit: Admitting: Cardiovascular Disease

## 2023-12-12 ENCOUNTER — Encounter: Payer: Self-pay | Admitting: Cardiovascular Disease

## 2023-12-12 VITALS — BP 117/74 | HR 90 | Ht 69.0 in | Wt 181.8 lb

## 2023-12-12 DIAGNOSIS — Z6825 Body mass index (BMI) 25.0-25.9, adult: Secondary | ICD-10-CM | POA: Diagnosis not present

## 2023-12-12 DIAGNOSIS — E663 Overweight: Secondary | ICD-10-CM

## 2023-12-12 DIAGNOSIS — E782 Mixed hyperlipidemia: Secondary | ICD-10-CM | POA: Diagnosis not present

## 2023-12-12 DIAGNOSIS — I2119 ST elevation (STEMI) myocardial infarction involving other coronary artery of inferior wall: Secondary | ICD-10-CM | POA: Diagnosis not present

## 2023-12-12 DIAGNOSIS — I152 Hypertension secondary to endocrine disorders: Secondary | ICD-10-CM | POA: Diagnosis not present

## 2023-12-12 DIAGNOSIS — F17210 Nicotine dependence, cigarettes, uncomplicated: Secondary | ICD-10-CM

## 2023-12-12 DIAGNOSIS — E1159 Type 2 diabetes mellitus with other circulatory complications: Secondary | ICD-10-CM

## 2023-12-12 DIAGNOSIS — E1169 Type 2 diabetes mellitus with other specified complication: Secondary | ICD-10-CM

## 2023-12-12 DIAGNOSIS — R0602 Shortness of breath: Secondary | ICD-10-CM

## 2023-12-12 DIAGNOSIS — Z720 Tobacco use: Secondary | ICD-10-CM

## 2023-12-12 NOTE — Progress Notes (Signed)
 Cardiology Office Note   Date:  12/12/2023   ID:  Henry Anderson, DOB 06/10/1965, MRN [id number]  PCP:  Fernand Fredy RAMAN, MD  Cardiologist:  Denyse Fernand, MD      History of Present Illness: Henry Anderson is a 58 y.o. male who presents for  Chief Complaint  Patient presents with   Follow-up    4 week follow up     Has SOB      Past Medical History:  Diagnosis Date   Diabetes mellitus without complication (HCC)    Emphysema of lung (HCC)    Hypertension    Stroke [facility])      Past Surgical History:  Procedure Laterality Date   CORONARY/GRAFT ACUTE MI REVASCULARIZATION N/A 11/08/2023   Procedure: Coronary/Graft Acute MI Revascularization;  Surgeon: Ammon Blunt, MD;  Location: [facility] INVASIVE CV LAB;  Service: Cardiovascular;  Laterality: N/A;   FOOT SURGERY Right    KNEE SURGERY Right    LEFT HEART CATH AND CORONARY ANGIOGRAPHY N/A 11/08/2023   Procedure: LEFT HEART CATH AND CORONARY ANGIOGRAPHY;  Surgeon: Ammon Blunt, MD;  Location: [facility] INVASIVE CV LAB;  Service: Cardiovascular;  Laterality: N/A;   SHOULDER SURGERY Left      Current Outpatient Medications  Medication Sig Dispense Refill   acetaminophen  (TYLENOL ) 325 MG tablet Take 2 tablets (650 mg total) by mouth every 4 (four) hours as needed for mild pain (or temp > 37.5 C (99.5 F)).     albuterol  (PROAIR  HFA) 108 (90 Base) MCG/ACT inhaler Inhale 2 puffs into the lungs every 6 (six) hours as needed for wheezing or shortness of breath. 18 g 2   aspirin  EC (SM ASPIRIN  ADULT LOW STRENGTH) 81 MG tablet Take 1 tablet (81 mg total) by mouth daily. 90 tablet 3   atorvastatin  (LIPITOR ) 80 MG tablet Take 1 tablet (80 mg total) by mouth daily. 90 tablet 4   baclofen  (LIORESAL ) 10 MG tablet Take 1 tablet (10 mg total) by mouth daily. 30 tablet 1   blood glucose meter kit and supplies KIT Use as directed 1 each 0   budesonide -formoterol  (SYMBICORT ) 160-4.5 MCG/ACT inhaler Inhale 2 puffs into the lungs in  the morning and at bedtime. 10.2 g 12   buPROPion  (WELLBUTRIN  XL) 300 MG 24 hr tablet Take 1 tablet (300 mg total) by mouth every morning. 30 tablet 2   chlorthalidone  (HYGROTON ) 25 MG tablet Take 1 tablet (25 mg total) by mouth once daily. 90 tablet 2   clopidogrel  (PLAVIX ) 75 MG tablet Take 1 tablet (75 mg total) by mouth once daily. 90 tablet 4   Continuous Glucose Sensor (DEXCOM G7 SENSOR) MISC Use as directed. Change every 10 days . 5 each 2   gabapentin  (NEURONTIN ) 100 MG capsule Take 1 capsule (100 mg total) by mouth at bedtime. 90 capsule 2   glucose blood (ACCU-CHEK GUIDE TEST) test strip Use to check blood glucose up to 4 times a day 100 each 3   Insulin  Glargine (BASAGLAR  KWIKPEN) 100 UNIT/ML Inject 45 Units into the skin daily. (Patient taking differently: Inject 50 Units into the skin daily.) 30 mL 6   insulin  lispro (HUMALOG  KWIKPEN) 200 UNIT/ML KwikPen No insulin  if blood sugar less than 150, Inject 3 units if 151-200, inject 6 units if 201-250, inject 9 units if 251-300, inject 12 units if 301-350. Sliding scale with 12 units/day max 3 mL 0   Insulin  Pen Needle (TRUEPLUS 5-BEVEL PEN NEEDLES) 32G X 4 MM MISC Inject  1 Needle with Basaglar  as directed daily. 100 each 2   losartan  (COZAAR ) 25 MG tablet Take 1 tablet (25 mg total) by mouth daily. 90 tablet 1   metFORMIN  (GLUCOPHAGE -XR) 500 MG 24 hr tablet Take 2 tablets (1,000 mg total) by mouth daily with breakfast. 180 tablet 1   metoprolol  succinate (TOPROL -XL) 25 MG 24 hr tablet Take 0.5 tablets (12.5 mg total) by mouth daily. 90 tablet 3   nicotine  (NICODERM CQ  - DOSED IN MG/24 HOURS) 21 mg/24hr patch Place 1 patch (21 mg total) onto the skin daily. 28 patch 0   nitroGLYCERIN  (NITROSTAT ) 0.4 MG SL tablet Place 1 tablet (0.4 mg total) under the tongue every 5 (five) minutes as needed for chest pain. 25 tablet 12   ondansetron  (ZOFRAN ) 4 MG tablet Take 1 tablet (4 mg total) by mouth every 8 (eight) hours as needed for nausea or vomiting.  20 tablet 0   pantoprazole  (PROTONIX ) 40 MG tablet Take 1 tablet (40 mg total) by mouth daily. 90 tablet 3   potassium chloride  (KLOR-CON  M) 10 MEQ tablet Take 2 tablets (20 mEq total) by mouth 3 (three) times daily for 3 days, THEN 1 tablet (10 mEq total) 3 (three) times daily for 27 days. 100 tablet 3   Rightest GL300 Lancets MISC Use up to four times daily as directed to check blood sugar. 100 each 11   No current facility-administered medications for this visit.    Allergies:   Patient has no known allergies.    Social History:   reports that he has been smoking cigarettes. He has a 45 pack-year smoking history. He has quit using smokeless tobacco.  His smokeless tobacco use included chew. He reports current alcohol use. He reports that he does not use drugs.   Family History:  family history includes COPD in his maternal grandmother and mother; Diabetes in his father, paternal grandfather, paternal grandmother, sister, and sister; Heart attack in his maternal grandfather; Hyperlipidemia in his paternal grandfather and paternal grandmother; Hypertension in his paternal grandfather and paternal grandmother.    ROS:     Review of Systems  Constitutional: Negative.   HENT: Negative.    Eyes: Negative.   Respiratory: Negative.    Gastrointestinal: Negative.   Genitourinary: Negative.   Musculoskeletal: Negative.   Skin: Negative.   Neurological: Negative.   Endo/Heme/Allergies: Negative.   Psychiatric/Behavioral: Negative.    All other systems reviewed and are negative.     All other systems are reviewed and negative.    PHYSICAL EXAM: VS:  BP 117/74   Pulse 90   Ht 5' 9 (1.753 m)   Wt 181 lb 12.8 oz (82.5 kg)   SpO2 96%   BMI 26.85 kg/m  , BMI Body mass index is 26.85 kg/m. Last weight:  Wt Readings from Last 3 Encounters:  12/12/23 181 lb 12.8 oz (82.5 kg)  12/05/23 175 lb 9.6 oz (79.7 kg)  11/29/23 177 lb 9.6 oz (80.6 kg)     Physical Exam Vitals reviewed.   Constitutional:      Appearance: Normal appearance. He is normal weight.  HENT:     Head: Normocephalic.     Nose: Nose normal.     Mouth/Throat:     Mouth: Mucous membranes are moist.  Eyes:     Pupils: Pupils are equal, round, and reactive to light.  Cardiovascular:     Rate and Rhythm: Normal rate and regular rhythm.     Pulses: Normal pulses.  Heart sounds: Normal heart sounds.  Pulmonary:     Effort: Pulmonary effort is normal.  Abdominal:     General: Abdomen is flat. Bowel sounds are normal.  Musculoskeletal:        General: Normal range of motion.     Cervical back: Normal range of motion.  Skin:    General: Skin is warm.  Neurological:     General: No focal deficit present.     Mental Status: He is alert.  Psychiatric:        Mood and Affect: Mood normal.       EKG:   Recent Labs: 11/14/2023: Hemoglobin 15.3; Platelets 330 11/28/2023: ALT 16; BUN 10; Creatinine, Ser 0.93; Potassium 3.3; Sodium 139; TSH 1.710    Lipid Panel    Component Value Date/Time   CHOL 134 11/28/2023 0928   TRIG 133 11/28/2023 0928   HDL 29 (L) 11/28/2023 0928   CHOLHDL 6.6 11/08/2023 1844   VLDL UNABLE TO CALCULATE IF TRIGLYCERIDE OVER 400 mg/dL 89/70/7974 8155   LDLCALC 81 11/28/2023 0928   LDLDIRECT 81 11/09/2023 0908      Other studies Reviewed: Additional studies/ records that were reviewed today include:  Review of the above records demonstrates:       No data to display            ASSESSMENT AND PLAN:    ICD-10-CM   1. STEMI involving oth coronary artery of inferior wall (HCC)  I21.19    on losartan  25 daily    2. Nicotine  abuse  Z72.0     3. Mixed hyperlipidemia  E78.2     4. Combined hyperlipidemia associated with type 2 diabetes mellitus (HCC)  E11.69    E78.2     5. Hypertension associated with diabetes (HCC)  E11.59    I15.2     6. Overweight with body mass index (BMI) of 25 to 25.9 in adult  E66.3    Z68.25     7. Overweight (BMI  25.0-29.9)  E66.3        Problem List Items Addressed This Visit       Cardiovascular and Mediastinum   Hypertension associated with diabetes (HCC)   STEMI involving oth coronary artery of inferior wall (HCC) - Primary     Endocrine   Combined hyperlipidemia associated with type 2 diabetes mellitus (HCC)     Other   Nicotine  abuse   Mixed hyperlipidemia   Overweight (BMI 25.0-29.9)   Overweight with body mass index (BMI) of 25 to 25.9 in adult       Disposition:   Return in about 2 months (around 02/12/2024).    Total time spent: 30 minutes  Signed,  Denyse Bathe, MD  12/12/2023 11:28 AM    [facility]

## 2023-12-13 ENCOUNTER — Encounter

## 2023-12-14 ENCOUNTER — Encounter

## 2023-12-18 ENCOUNTER — Encounter

## 2023-12-19 ENCOUNTER — Ambulatory Visit: Admitting: Internal Medicine

## 2023-12-20 ENCOUNTER — Encounter

## 2023-12-21 ENCOUNTER — Encounter

## 2023-12-25 ENCOUNTER — Encounter

## 2023-12-27 ENCOUNTER — Encounter

## 2023-12-27 ENCOUNTER — Other Ambulatory Visit: Payer: Self-pay

## 2023-12-27 ENCOUNTER — Telehealth: Payer: Self-pay

## 2023-12-27 ENCOUNTER — Other Ambulatory Visit: Payer: Self-pay | Admitting: Internal Medicine

## 2023-12-27 DIAGNOSIS — E1142 Type 2 diabetes mellitus with diabetic polyneuropathy: Secondary | ICD-10-CM

## 2023-12-27 DIAGNOSIS — Z955 Presence of coronary angioplasty implant and graft: Secondary | ICD-10-CM

## 2023-12-27 DIAGNOSIS — I213 ST elevation (STEMI) myocardial infarction of unspecified site: Secondary | ICD-10-CM

## 2023-12-27 NOTE — Telephone Encounter (Signed)
 Called, no answer. mailbox full unable to leave message. Has not attended rehab since 11/29/2023.

## 2023-12-27 NOTE — Progress Notes (Signed)
 Cardiac Individual Treatment Plan  Patient Details  Name: Henry Anderson MRN: [id number] Date of Birth: 01-Jan-1966 Referring Provider:   Flowsheet Row Cardiac Rehab from 11/29/2023 in [facility] Cardiac and Pulmonary Rehab  Referring Provider Fernand Alter, MD    Initial Encounter Date:  Flowsheet Row Cardiac Rehab from 11/29/2023 in [facility] Cardiac and Pulmonary Rehab  Date 11/29/23    Visit Diagnosis: ST elevation myocardial infarction (STEMI), unspecified artery [facility])  Status post coronary artery stent placement  Patient's Home Medications on Admission: Current Medications[1]  Past Medical History: Past Medical History:  Diagnosis Date   Diabetes mellitus without complication (HCC)    Emphysema of lung (HCC)    Hypertension    Stroke (HCC)     Tobacco Use: Tobacco Use History[2]  Labs: Review Flowsheet  More data exists      Latest Ref Rng & Units 04/14/2023 07/27/2023 11/08/2023 11/09/2023 11/28/2023  Labs for ITP Cardiac and Pulmonary Rehab  Cholestrol 100 - 199 mg/dL 898  863  782  - 865   LDL (calc) 0 - 99 mg/dL 57  74  UNABLE TO CALCULATE IF TRIGLYCERIDE OVER 400 mg/dL  - 81   Direct LDL 0 - 99 mg/dL - - - 81  -  HDL-C >60 mg/dL 25  33  33  - 29   Trlycerides 0 - 149 mg/dL 898  832  349  - 866   Hemoglobin A1c 4.8 - 5.6 % 7.6  7.7  9.7  - 9.7      Exercise Target Goals: Exercise Program Goal: Individual exercise prescription set using results from initial 6 min walk test and THRR while considering  patients activity barriers and safety.   Exercise Prescription Goal: Initial exercise prescription builds to 30-45 minutes a day of aerobic activity, 2-3 days per week.  Home exercise guidelines will be given to patient during program as part of exercise prescription that the participant will acknowledge.   Education: Aerobic Exercise: - Group verbal and visual presentation on the components of exercise prescription. Introduces F.I.T.T principle from ACSM for exercise  prescriptions.  Reviews F.I.T.T. principles of aerobic exercise including progression. Written material provided at class time.   Education: Resistance Exercise: - Group verbal and visual presentation on the components of exercise prescription. Introduces F.I.T.T principle from ACSM for exercise prescriptions  Reviews F.I.T.T. principles of resistance exercise including progression. Written material provided at class time.    Education: Exercise & Equipment Safety: - Individual verbal instruction and demonstration of equipment use and safety with use of the equipment. Flowsheet Row Cardiac Rehab from 11/29/2023 in [facility] Cardiac and Pulmonary Rehab  Date 11/29/23  Educator MB  Instruction Review Code 1- Verbalizes Understanding    Education: Exercise Physiology & General Exercise Guidelines: - Group verbal and written instruction with models to review the exercise physiology of the cardiovascular system and associated critical values. Provides general exercise guidelines with specific guidelines to those with heart or lung disease. Written material provided at class time.   Education: Flexibility, Balance, Mind/Body Relaxation: - Group verbal and visual presentation with interactive activity on the components of exercise prescription. Introduces F.I.T.T principle from ACSM for exercise prescriptions. Reviews F.I.T.T. principles of flexibility and balance exercise training including progression. Also discusses the mind body connection.  Reviews various relaxation techniques to help reduce and manage stress (i.e. Deep breathing, progressive muscle relaxation, and visualization). Balance handout provided to take home. Written material provided at class time.   Activity Barriers & Risk Stratification:  Activity  Barriers & Cardiac Risk Stratification - 11/29/23 1547       Activity Barriers & Cardiac Risk Stratification   Activity Barriers Balance Concerns;Joint Problems;Arthritis;Assistive  Device   HX of rotator cuff sugery in L shoulder, arthritis in back and possibly R hip, soreness from catherization in R arm, uses cane out in public   Cardiac Risk Stratification Moderate          6 Minute Walk:  6 Minute Walk     Row Name 11/29/23 1545         6 Minute Walk   Phase Initial     Distance 945 feet     Walk Time 6 minutes     # of Rest Breaks 0     MPH 1.79     METS 3.39     RPE 15     Perceived Dyspnea  2     VO2 Peak 11.87     Symptoms Yes (comment)     Comments R hip pain 4/10, Bilateral calf burning pain 7/10     Resting HR 93 bpm     Resting BP 106/66     Resting Oxygen Saturation  96 %     Exercise Oxygen Saturation  during 6 min walk 98 %     Max Ex. HR 105 bpm     Max Ex. BP 150/78     2 Minute Post BP 130/70        Oxygen Initial Assessment:   Oxygen Re-Evaluation:   Oxygen Discharge (Final Oxygen Re-Evaluation):   Initial Exercise Prescription:  Initial Exercise Prescription - 11/29/23 1500       Date of Initial Exercise RX and Referring Provider   Date 11/29/23    Referring Provider Fernand Alter, MD      Oxygen   Maintain Oxygen Saturation 88% or higher      Recumbant Bike   Level 2    RPM 50    Watts 15    Minutes 15    METs 3.39      NuStep   Level 2    SPM 80    Minutes 15    METs 3.39      REL-XR   Level 1    Speed 50    Minutes 15    METs 3.39      T5 Nustep   Level 2    SPM 80    Minutes 15    METs 3.39      Biostep-RELP   Level 2    SPM 50    Minutes 15    METs 3.39      Track   Laps 21    Minutes 15    METs 2.14      Prescription Details   Frequency (times per week) 3    Duration Progress to 30 minutes of continuous aerobic without signs/symptoms of physical distress      Intensity   THRR 40-80% of Max Heartrate 120-149    Ratings of Perceived Exertion 11-13    Perceived Dyspnea 0-4      Progression   Progression Continue to progress workloads to maintain intensity without  signs/symptoms of physical distress.      Resistance Training   Training Prescription Yes    Weight 5 lb    Reps 10-15          Perform Capillary Blood Glucose checks as needed.  Exercise Prescription Changes:   Exercise Prescription Changes  Row Name 11/29/23 1500             Response to Exercise   Blood Pressure (Admit) 106/66       Blood Pressure (Exercise) 150/78       Blood Pressure (Exit) 130/70       Heart Rate (Admit) 93 bpm       Heart Rate (Exercise) 105 bpm       Heart Rate (Exit) 91 bpm       Oxygen Saturation (Admit) 96 %       Oxygen Saturation (Exercise) 98 %       Oxygen Saturation (Exit) 97 %       Rating of Perceived Exertion (Exercise) 15       Perceived Dyspnea (Exercise) 2       Symptoms R hip pain 4/10, bilateral calf burning pain 7/10       Comments results         Progression   Average METs 3.39          Exercise Comments:   Exercise Goals and Review:   Exercise Goals     Row Name 11/29/23 1552             Exercise Goals   Increase Physical Activity Yes       Intervention Provide advice, education, support and counseling about physical activity/exercise needs.;Develop an individualized exercise prescription for aerobic and resistive training based on initial evaluation findings, risk stratification, comorbidities and participant's personal goals.       Expected Outcomes Short Term: Attend rehab on a regular basis to increase amount of physical activity.;Long Term: Add in home exercise to make exercise part of routine and to increase amount of physical activity.;Long Term: Exercising regularly at least 3-5 days a week.       Increase Strength and Stamina Yes       Intervention Provide advice, education, support and counseling about physical activity/exercise needs.;Develop an individualized exercise prescription for aerobic and resistive training based on initial evaluation findings, risk stratification, comorbidities and  participant's personal goals.       Expected Outcomes Short Term: Perform resistance training exercises routinely during rehab and add in resistance training at home;Short Term: Increase workloads from initial exercise prescription for resistance, speed, and METs.;Long Term: Improve cardiorespiratory fitness, muscular endurance and strength as measured by increased METs and functional capacity ( )       Able to understand and use rate of perceived exertion (RPE) scale Yes       Intervention Provide education and explanation on how to use RPE scale       Expected Outcomes Short Term: Able to use RPE daily in rehab to express subjective intensity level;Long Term:  Able to use RPE to guide intensity level when exercising independently       Able to understand and use Dyspnea scale Yes       Intervention Provide education and explanation on how to use Dyspnea scale       Expected Outcomes Short Term: Able to use Dyspnea scale daily in rehab to express subjective sense of shortness of breath during exertion;Long Term: Able to use Dyspnea scale to guide intensity level when exercising independently       Knowledge and understanding of Target Heart Rate Range (THRR) Yes       Intervention Provide education and explanation of THRR including how the numbers were predicted and where they are located for reference  Expected Outcomes Short Term: Able to state/look up THRR;Short Term: Able to use daily as guideline for intensity in rehab;Long Term: Able to use THRR to govern intensity when exercising independently       Able to check pulse independently Yes       Intervention Provide education and demonstration on how to check pulse in carotid and radial arteries.;Review the importance of being able to check your own pulse for safety during independent exercise       Expected Outcomes Short Term: Able to explain why pulse checking is important during independent exercise;Long Term: Able to check pulse  independently and accurately       Understanding of Exercise Prescription Yes       Intervention Provide education, explanation, and written materials on patient's individual exercise prescription       Expected Outcomes Short Term: Able to explain program exercise prescription;Long Term: Able to explain home exercise prescription to exercise independently          Exercise Goals Re-Evaluation :   Discharge Exercise Prescription (Final Exercise Prescription Changes):  Exercise Prescription Changes - 11/29/23 1500       Response to Exercise   Blood Pressure (Admit) 106/66    Blood Pressure (Exercise) 150/78    Blood Pressure (Exit) 130/70    Heart Rate (Admit) 93 bpm    Heart Rate (Exercise) 105 bpm    Heart Rate (Exit) 91 bpm    Oxygen Saturation (Admit) 96 %    Oxygen Saturation (Exercise) 98 %    Oxygen Saturation (Exit) 97 %    Rating of Perceived Exertion (Exercise) 15    Perceived Dyspnea (Exercise) 2    Symptoms R hip pain 4/10, bilateral calf burning pain 7/10    Comments results      Progression   Average METs 3.39          Nutrition:  Target Goals: Understanding of nutrition guidelines, daily intake of sodium 1500mg , cholesterol 200mg , calories 30% from fat and 7% or less from saturated fats, daily to have 5 or more servings of fruits and vegetables.  Education: Nutrition 1 -Group instruction provided by verbal, written material, interactive activities, discussions, models, and posters to present general guidelines for heart healthy nutrition including macronutrients, label reading, and promoting whole foods over processed counterparts. Education serves as pensions consultant of discussion of heart healthy eating for all. Written material provided at class time.    Education: Nutrition 2 -Group instruction provided by verbal, written material, interactive activities, discussions, models, and posters to present general guidelines for heart healthy nutrition  including sodium, cholesterol, and saturated fat. Providing guidance of habit forming to improve blood pressure, cholesterol, and body weight. Written material provided at class time.     Biometrics:  Pre Biometrics - 11/29/23 1552       Pre Biometrics   Height 5' 9.6 (1.768 m)    Weight 177 lb 9.6 oz (80.6 kg)    Waist Circumference 44.1 inches    Hip Circumference 38 inches    Waist to Hip Ratio 1.16 %    BMI (Calculated) 25.77    Single Leg Stand 30 seconds           Nutrition Therapy Plan and Nutrition Goals:  Nutrition Therapy & Goals - 11/29/23 1553       Personal Nutrition Goals   Nutrition Goal Will meet w/ RD on 12/1      Intervention Plan   Intervention Prescribe, educate and counsel regarding  individualized specific dietary modifications aiming towards targeted core components such as weight, hypertension, lipid management, diabetes, heart failure and other comorbidities.    Expected Outcomes Short Term Goal: Understand basic principles of dietary content, such as calories, fat, sodium, cholesterol and nutrients.          Nutrition Assessments:  MEDIFICTS Score Key: >=70 Need to make dietary changes  40-70 Heart Healthy Diet <= 40 Therapeutic Level Cholesterol Diet   Picture Your Plate Scores: <59 Unhealthy dietary pattern with much room for improvement. 41-50 Dietary pattern unlikely to meet recommendations for good health and room for improvement. 51-60 More healthful dietary pattern, with some room for improvement.  >60 Healthy dietary pattern, although there may be some specific behaviors that could be improved.    Nutrition Goals Re-Evaluation:   Nutrition Goals Discharge (Final Nutrition Goals Re-Evaluation):   Psychosocial: Target Goals: Acknowledge presence or absence of significant depression and/or stress, maximize coping skills, provide positive support system. Participant is able to verbalize types and ability to use techniques and  skills needed for reducing stress and depression.   Education: Stress, Anxiety, and Depression - Group verbal and visual presentation to define topics covered.  Reviews how body is impacted by stress, anxiety, and depression.  Also discusses healthy ways to reduce stress and to treat/manage anxiety and depression. Written material provided at class time.   Education: Sleep Hygiene -Provides group verbal and written instruction about how sleep can affect your health.  Define sleep hygiene, discuss sleep cycles and impact of sleep habits. Review good sleep hygiene tips.   Initial Review & Psychosocial Screening:  Initial Psych Review & Screening - 11/27/23 1432       Initial Review   Current issues with Current Anxiety/Panic      Family Dynamics   Good Support System? Yes    Comments He takes some medication to help his anxiety. He can look to his wife, son , daughter and grandson for support.      Barriers   Psychosocial barriers to participate in program The patient should benefit from training in stress management and relaxation.;There are no identifiable barriers or psychosocial needs.      Screening Interventions   Interventions Encouraged to exercise;Provide feedback about the scores to participant;To provide support and resources with identified psychosocial needs    Expected Outcomes Short Term goal: Utilizing psychosocial counselor, staff and physician to assist with identification of specific Stressors or current issues interfering with healing process. Setting desired goal for each stressor or current issue identified.;Long Term Goal: Stressors or current issues are controlled or eliminated.;Short Term goal: Identification and review with participant of any Quality of Life or Depression concerns found by scoring the questionnaire.;Long Term goal: The participant improves quality of Life and PHQ9 Scores as seen by post scores and/or verbalization of changes          Quality of  Life Scores:   Scores of 19 and below usually indicate a poorer quality of life in these areas.  A difference of  2-3 points is a clinically meaningful difference.  A difference of 2-3 points in the total score of the Quality of Life Index has been associated with significant improvement in overall quality of life, self-image, physical symptoms, and general health in studies assessing change in quality of life.  PHQ-9: Review Flowsheet  More data exists      11/29/2023 01/13/2023 10/27/2022 11/23/2021 11/03/2020  Depression screen PHQ 2/9  Decreased Interest 0 0 3 3 1  Down, Depressed, Hopeless 0 0 0 3 1  PHQ - 2 Score 0 0 3 6 2   Altered sleeping 3 2 - 2 0  Tired, decreased energy 3 2 - 2 0  Change in appetite 3 0 - 2 0  Feeling bad or failure about yourself  0 0 - 3 1  Trouble concentrating 0 0 - 3 0  Moving slowly or fidgety/restless 0 0 - 2 0  Suicidal thoughts 0 0 - 0 0  PHQ-9 Score 9 4  - 20  3   Difficult doing work/chores Somewhat difficult Not difficult at all - - Not difficult at all    Details       Data saved with a previous flowsheet row definition        Interpretation of Total Score  Total Score Depression Severity:  1-4 = Minimal depression, 5-9 = Mild depression, 10-14 = Moderate depression, 15-19 = Moderately severe depression, 20-27 = Severe depression   Psychosocial Evaluation and Intervention:  Psychosocial Evaluation - 11/27/23 1433       Psychosocial Evaluation & Interventions   Interventions Encouraged to exercise with the program and follow exercise prescription;Relaxation education;Stress management education    Comments He takes some medication to help his anxiety. He can look to his wife, son , daughter and grandson for support.    Expected Outcomes Short: Start HeartTrack to help with mood. Long: Maintain a healthy mental state    Continue Psychosocial Services  Follow up required by staff          Psychosocial  Re-Evaluation:   Psychosocial Discharge (Final Psychosocial Re-Evaluation):   Vocational Rehabilitation: Provide vocational rehab assistance to qualifying candidates.   Vocational Rehab Evaluation & Intervention:   Education: Education Goals: Education classes will be provided on a variety of topics geared toward better understanding of heart health and risk factor modification. Participant will state understanding/return demonstration of topics presented as noted by education test scores.  Learning Barriers/Preferences:  Learning Barriers/Preferences - 11/27/23 1432       Learning Barriers/Preferences   Learning Barriers None    Learning Preferences None          General Cardiac Education Topics:  AED/CPR: - Group verbal and written instruction with the use of models to demonstrate the basic use of the AED with the basic ABC's of resuscitation.   Test and Procedures: - Group verbal and visual presentation and models provide information about basic cardiac anatomy and function. Reviews the testing methods done to diagnose heart disease and the outcomes of the test results. Describes the treatment choices: Medical Management, Angioplasty, or Coronary Bypass Surgery for treating various heart conditions including Myocardial Infarction, Angina, Valve Disease, and Cardiac Arrhythmias. Written material provided at class time.   Medication Safety: - Group verbal and visual instruction to review commonly prescribed medications for heart and lung disease. Reviews the medication, class of the drug, and side effects. Includes the steps to properly store meds and maintain the prescription regimen. Written material provided at class time.   Intimacy: - Group verbal instruction through game format to discuss how heart and lung disease can affect sexual intimacy. Written material provided at class time.   Know Your Numbers and Heart Failure: - Group verbal and visual instruction to  discuss disease risk factors for cardiac and pulmonary disease and treatment options.  Reviews associated critical values for Overweight/Obesity, Hypertension, Cholesterol, and Diabetes.  Discusses basics of heart failure: signs/symptoms and treatments.  Introduces Heart Failure Zone  chart for action plan for heart failure. Written material provided at class time.   Infection Prevention: - Provides verbal and written material to individual with discussion of infection control including proper hand washing and proper equipment cleaning during exercise session. Flowsheet Row Cardiac Rehab from 11/29/2023 in [facility] Cardiac and Pulmonary Rehab  Date 11/29/23  Educator MB  Instruction Review Code 1- Verbalizes Understanding    Falls Prevention: - Provides verbal and written material to individual with discussion of falls prevention and safety. Flowsheet Row Cardiac Rehab from 11/29/2023 in [facility] Cardiac and Pulmonary Rehab  Date 11/29/23  Educator MB  Instruction Review Code 1- Verbalizes Understanding    Other: -Provides group and verbal instruction on various topics (see comments)   Knowledge Questionnaire Score:   Core Components/Risk Factors/Patient Goals at Admission:  Personal Goals and Risk Factors at Admission - 11/29/23 1553       Core Components/Risk Factors/Patient Goals on Admission    Weight Management Weight Maintenance;Yes    Intervention Weight Management: Develop a combined nutrition and exercise program designed to reach desired caloric intake, while maintaining appropriate intake of nutrient and fiber, sodium and fats, and appropriate energy expenditure required for the weight goal.;Weight Management: Provide education and appropriate resources to help participant work on and attain dietary goals.;Weight Management/Obesity: Establish reasonable short term and long term weight goals.    Admit Weight 177 lb 9.6 oz (80.6 kg)    Goal Weight: Short Term 177 lb 9.6 oz (80.6 kg)     Goal Weight: Long Term 177 lb 9.6 oz (80.6 kg)    Expected Outcomes Short Term: Continue to assess and modify interventions until short term weight is achieved;Understanding recommendations for meals to include 15-35% energy as protein, 25-35% energy from fat, 35-60% energy from carbohydrates, less than 200mg  of dietary cholesterol, 20-35 gm of total fiber daily;Understanding of distribution of calorie intake throughout the day with the consumption of 4-5 meals/snacks;Weight Maintenance: Understanding of the daily nutrition guidelines, which includes 25-35% calories from fat, 7% or less cal from saturated fats, less than 200mg  cholesterol, less than 1.5gm of sodium, & 5 or more servings of fruits and vegetables daily;Long Term: Adherence to nutrition and physical activity/exercise program aimed toward attainment of established weight goal    Tobacco Cessation Yes    Number of packs per day 1.5    Intervention Assist the participant in steps to quit. Provide individualized education and counseling about committing to Tobacco Cessation, relapse prevention, and pharmacological support that can be provided by physician.;Education officer, environmental, assist with locating and accessing local/national Quit Smoking programs, and support quit date choice.    Expected Outcomes Short Term: Will demonstrate readiness to quit, by selecting a quit date.;Long Term: Complete abstinence from all tobacco products for at least 12 months from quit date.;Short Term: Will quit all tobacco product use, adhering to prevention of relapse plan.    Diabetes Yes    Intervention Provide education about signs/symptoms and action to take for hypo/hyperglycemia.;Provide education about proper nutrition, including hydration, and aerobic/resistive exercise prescription along with prescribed medications to achieve blood glucose in normal ranges: Fasting glucose 65-99 mg/dL    Expected Outcomes Short Term: Participant verbalizes  understanding of the signs/symptoms and immediate care of hyper/hypoglycemia, proper foot care and importance of medication, aerobic/resistive exercise and nutrition plan for blood glucose control.;Long Term: Attainment of HbA1C < 7%.    Hypertension Yes    Intervention Provide education on lifestyle modifcations including regular physical activity/exercise, weight management, moderate  sodium restriction and increased consumption of fresh fruit, vegetables, and low fat dairy, alcohol moderation, and smoking cessation.;Monitor prescription use compliance.    Expected Outcomes Short Term: Continued assessment and intervention until BP is < 140/34mm HG in hypertensive participants. < 130/29mm HG in hypertensive participants with diabetes, heart failure or chronic kidney disease.;Long Term: Maintenance of blood pressure at goal levels.    Lipids Yes    Intervention Provide education and support for participant on nutrition & aerobic/resistive exercise along with prescribed medications to achieve LDL 70mg , HDL >40mg .    Expected Outcomes Short Term: Participant states understanding of desired cholesterol values and is compliant with medications prescribed. Participant is following exercise prescription and nutrition guidelines.;Long Term: Cholesterol controlled with medications as prescribed, with individualized exercise RX and with personalized nutrition plan. Value goals: LDL < 70mg , HDL > 40 mg.          Education:Diabetes - Individual verbal and written instruction to review signs/symptoms of diabetes, desired ranges of glucose level fasting, after meals and with exercise. Acknowledge that pre and post exercise glucose checks will be done for 3 sessions at entry of program. Flowsheet Row Cardiac Rehab from 11/29/2023 in [facility]  Date 11/29/23  Educator MB  Instruction Review Code 1- Verbalizes Understanding    Core Components/Risk Factors/Patient Goals Review:    Core  Components/Risk Factors/Patient Goals at Discharge (Final Review):    ITP Comments:  ITP Comments     Row Name 11/27/23 1434 11/29/23 1543 12/27/23 1112       ITP Comments Virtual Visit completed. Patient informed on EP and RD appointment and 6 Minute walk test. Patient also informed of patient health questionnaires on My Chart. Patient Verbalizes understanding. Visit diagnosis can be found in [facility] 11/08/2023. Completed and gym orientation for cardiac rehab. Initial ITP created and sent for review to Dr. Oneil Pinal, Medical Director. Andrius is a current tobacco user. Intervention for tobacco cessation was provided at the initial medical review. He was asked about readiness to quit and reported that he is slowly decreasing his amount of cigarettes and is currently down to 12-14 a day with the help of wellbutrin . He does not have a set quit date, but would like to by the new year. Patient was advised and educated about tobacco cessation using combination therapy, tobacco cessation classes, quit line, and quit smoking apps. Patient demonstrated understanding of this material. Staff will continue to provide encouragement and follow up with the patient throughout the program. 30 Day review completed. Medical Director ITP review done, changes made as directed, and signed approval by Medical Director.        Comments: 30 day review    [1]  Current Outpatient Medications:    acetaminophen  (TYLENOL ) 325 MG tablet, Take 2 tablets (650 mg total) by mouth every 4 (four) hours as needed for mild pain (or temp > 37.5 C (99.5 F))., Disp: , Rfl:    albuterol  (PROAIR  HFA) 108 (90 Base) MCG/ACT inhaler, Inhale 2 puffs into the lungs every 6 (six) hours as needed for wheezing or shortness of breath., Disp: 18 g, Rfl: 2   aspirin  EC (SM ASPIRIN  ADULT LOW STRENGTH) 81 MG tablet, Take 1 tablet (81 mg total) by mouth daily., Disp: 90 tablet, Rfl: 3   atorvastatin  (LIPITOR ) 80 MG tablet, Take 1 tablet (80 mg  total) by mouth daily., Disp: 90 tablet, Rfl: 4   baclofen  (LIORESAL ) 10 MG tablet, Take 1 tablet (10 mg total) by  mouth daily., Disp: 30 tablet, Rfl: 1   blood glucose meter kit and supplies KIT, Use as directed, Disp: 1 each, Rfl: 0   budesonide -formoterol  (SYMBICORT ) 160-4.5 MCG/ACT inhaler, Inhale 2 puffs into the lungs in the morning and at bedtime., Disp: 10.2 g, Rfl: 12   buPROPion  (WELLBUTRIN  XL) 300 MG 24 hr tablet, Take 1 tablet (300 mg total) by mouth every morning., Disp: 30 tablet, Rfl: 2   chlorthalidone  (HYGROTON ) 25 MG tablet, Take 1 tablet (25 mg total) by mouth once daily., Disp: 90 tablet, Rfl: 2   clopidogrel  (PLAVIX ) 75 MG tablet, Take 1 tablet (75 mg total) by mouth once daily., Disp: 90 tablet, Rfl: 4   Continuous Glucose Sensor (DEXCOM G7 SENSOR) MISC, Use as directed. Change every 10 days ., Disp: 5 each, Rfl: 2   gabapentin  (NEURONTIN ) 100 MG capsule, Take 1 capsule (100 mg total) by mouth at bedtime., Disp: 90 capsule, Rfl: 2   glucose blood (ACCU-CHEK GUIDE TEST) test strip, Use to check blood glucose up to 4 times a day, Disp: 100 each, Rfl: 3   Insulin  Glargine (BASAGLAR  KWIKPEN) 100 UNIT/ML, Inject 45 Units into the skin daily. (Patient taking differently: Inject 50 Units into the skin daily.), Disp: 30 mL, Rfl: 6   insulin  lispro (HUMALOG  KWIKPEN) 200 UNIT/ML KwikPen, No insulin  if blood sugar less than 150, Inject 3 units if 151-200, inject 6 units if 201-250, inject 9 units if 251-300, inject 12 units if 301-350. Sliding scale with 12 units/day max, Disp: 3 mL, Rfl: 0   Insulin  Pen Needle (TRUEPLUS 5-BEVEL PEN NEEDLES) 32G X 4 MM MISC, Inject 1 Needle with Basaglar  as directed daily., Disp: 100 each, Rfl: 2   losartan  (COZAAR ) 25 MG tablet, Take 1 tablet (25 mg total) by mouth daily., Disp: 90 tablet, Rfl: 1   metFORMIN  (GLUCOPHAGE -XR) 500 MG 24 hr tablet, Take 2 tablets (1,000 mg total) by mouth daily with breakfast., Disp: 180 tablet, Rfl: 1   metoprolol  succinate  (TOPROL -XL) 25 MG 24 hr tablet, Take 0.5 tablets (12.5 mg total) by mouth daily., Disp: 90 tablet, Rfl: 3   nicotine  (NICODERM CQ  - DOSED IN MG/24 HOURS) 21 mg/24hr patch, Place 1 patch (21 mg total) onto the skin daily., Disp: 28 patch, Rfl: 0   nitroGLYCERIN  (NITROSTAT ) 0.4 MG SL tablet, Place 1 tablet (0.4 mg total) under the tongue every 5 (five) minutes as needed for chest pain., Disp: 25 tablet, Rfl: 12   ondansetron  (ZOFRAN ) 4 MG tablet, Take 1 tablet (4 mg total) by mouth every 8 (eight) hours as needed for nausea or vomiting., Disp: 20 tablet, Rfl: 0   pantoprazole  (PROTONIX ) 40 MG tablet, Take 1 tablet (40 mg total) by mouth daily., Disp: 90 tablet, Rfl: 3   potassium chloride  (KLOR-CON  M) 10 MEQ tablet, Take 2 tablets (20 mEq total) by mouth 3 (three) times daily for 3 days, THEN 1 tablet (10 mEq total) 3 (three) times daily for 27 days., Disp: 100 tablet, Rfl: 3   Rightest GL300 Lancets MISC, Use up to four times daily as directed to check blood sugar., Disp: 100 each, Rfl: 11 [2]  Social History Tobacco Use  Smoking Status Every Day   Current packs/day: 1.50   Average packs/day: 1.5 packs/day for 30.0 years (45.0 ttl pk-yrs)   Types: Cigarettes  Smokeless Tobacco Former   Types: Chew  Tobacco Comments   Patient has cut down to ~1 ppd since having stroke. Gave  Quit info. Patient down to 1/2  ppd

## 2023-12-28 ENCOUNTER — Other Ambulatory Visit: Payer: Self-pay

## 2023-12-28 ENCOUNTER — Telehealth: Payer: Self-pay

## 2023-12-28 ENCOUNTER — Encounter

## 2023-12-28 MED ORDER — HUMALOG KWIKPEN 200 UNIT/ML ~~LOC~~ SOPN
PEN_INJECTOR | SUBCUTANEOUS | 0 refills | Status: DC
  Filled 2023-12-28: qty 3, 25d supply, fill #0

## 2023-12-28 NOTE — Telephone Encounter (Signed)
 Called, No answer. mailbox full, unable to leave message. Will send MyChart letter informing of discharge in 7days if no communication recieved from patient about absents.

## 2024-01-01 ENCOUNTER — Encounter

## 2024-01-02 ENCOUNTER — Ambulatory Visit: Admitting: Internal Medicine

## 2024-01-03 ENCOUNTER — Encounter

## 2024-01-08 ENCOUNTER — Encounter

## 2024-01-09 DIAGNOSIS — Z955 Presence of coronary angioplasty implant and graft: Secondary | ICD-10-CM

## 2024-01-09 DIAGNOSIS — I213 ST elevation (STEMI) myocardial infarction of unspecified site: Secondary | ICD-10-CM

## 2024-01-09 NOTE — Progress Notes (Signed)
 Early Discharge Summary  Henry Anderson 1965-08-20  Henry Anderson is discharging early due to not attending program. He completed 1 of 36 sessions.    6 Minute Walk     Row Name 11/29/23 1545         6 Minute Walk   Phase Initial     Distance 945 feet     Walk Time 6 minutes     # of Rest Breaks 0     MPH 1.79     METS 3.39     RPE 15     Perceived Dyspnea  2     VO2 Peak 11.87     Symptoms Yes (comment)     Comments R hip pain 4/10, Bilateral calf burning pain 7/10     Resting HR 93 bpm     Resting BP 106/66     Resting Oxygen Saturation  96 %     Exercise Oxygen Saturation  during 6 min walk 98 %     Max Ex. HR 105 bpm     Max Ex. BP 150/78     2 Minute Post BP 130/70

## 2024-01-09 NOTE — Progress Notes (Signed)
 Cardiac Individual Treatment Plan  Patient Details  Name: Henry Anderson MRN: [id number] Date of Birth: 02-06-1965 Referring Provider:   Flowsheet Row Cardiac Rehab from 11/29/2023 in [facility] Cardiac and Pulmonary Rehab  Referring Provider Fernand Alter, MD    Initial Encounter Date:  Flowsheet Row Cardiac Rehab from 11/29/2023 in [facility] Cardiac and Pulmonary Rehab  Date 11/29/23    Visit Diagnosis: ST elevation myocardial infarction (STEMI), unspecified artery [facility])  Status post coronary artery stent placement  Patient's Home Medications on Admission: Current Medications[1]  Past Medical History: Past Medical History:  Diagnosis Date   Diabetes mellitus without complication (HCC)    Emphysema of lung (HCC)    Hypertension    Stroke (HCC)     Tobacco Use: Tobacco Use History[2]  Labs: Review Flowsheet  More data exists      Latest Ref Rng & Units 04/14/2023 07/27/2023 11/08/2023 11/09/2023 11/28/2023  Labs for ITP Cardiac and Pulmonary Rehab  Cholestrol 100 - 199 mg/dL 898  863  782  - 865   LDL (calc) 0 - 99 mg/dL 57  74  UNABLE TO CALCULATE IF TRIGLYCERIDE OVER 400 mg/dL  - 81   Direct LDL 0 - 99 mg/dL - - - 81  -  HDL-C >60 mg/dL 25  33  33  - 29   Trlycerides 0 - 149 mg/dL 898  832  349  - 866   Hemoglobin A1c 4.8 - 5.6 % 7.6  7.7  9.7  - 9.7      Exercise Target Goals: Exercise Program Goal: Individual exercise prescription set using results from initial 6 min walk test and THRR while considering  patients activity barriers and safety.   Exercise Prescription Goal: Initial exercise prescription builds to 30-45 minutes a day of aerobic activity, 2-3 days per week.  Home exercise guidelines will be given to patient during program as part of exercise prescription that the participant will acknowledge.   Education: Aerobic Exercise: - Group verbal and visual presentation on the components of exercise prescription. Introduces F.I.T.T principle from ACSM for exercise  prescriptions.  Reviews F.I.T.T. principles of aerobic exercise including progression. Written material provided at class time.   Education: Resistance Exercise: - Group verbal and visual presentation on the components of exercise prescription. Introduces F.I.T.T principle from ACSM for exercise prescriptions  Reviews F.I.T.T. principles of resistance exercise including progression. Written material provided at class time.    Education: Exercise & Equipment Safety: - Individual verbal instruction and demonstration of equipment use and safety with use of the equipment. Flowsheet Row Cardiac Rehab from 11/29/2023 in [facility] Cardiac and Pulmonary Rehab  Date 11/29/23  Educator MB  Instruction Review Code 1- Verbalizes Understanding    Education: Exercise Physiology & General Exercise Guidelines: - Group verbal and written instruction with models to review the exercise physiology of the cardiovascular system and associated critical values. Provides general exercise guidelines with specific guidelines to those with heart or lung disease. Written material provided at class time.   Education: Flexibility, Balance, Mind/Body Relaxation: - Group verbal and visual presentation with interactive activity on the components of exercise prescription. Introduces F.I.T.T principle from ACSM for exercise prescriptions. Reviews F.I.T.T. principles of flexibility and balance exercise training including progression. Also discusses the mind body connection.  Reviews various relaxation techniques to help reduce and manage stress (i.e. Deep breathing, progressive muscle relaxation, and visualization). Balance handout provided to take home. Written material provided at class time.   Activity Barriers & Risk Stratification:  Activity  Barriers & Cardiac Risk Stratification - 11/29/23 1547       Activity Barriers & Cardiac Risk Stratification   Activity Barriers Balance Concerns;Joint Problems;Arthritis;Assistive  Device   HX of rotator cuff sugery in L shoulder, arthritis in back and possibly R hip, soreness from catherization in R arm, uses cane out in public   Cardiac Risk Stratification Moderate          6 Minute Walk:  6 Minute Walk     Row Name 11/29/23 1545         6 Minute Walk   Phase Initial     Distance 945 feet     Walk Time 6 minutes     # of Rest Breaks 0     MPH 1.79     METS 3.39     RPE 15     Perceived Dyspnea  2     VO2 Peak 11.87     Symptoms Yes (comment)     Comments R hip pain 4/10, Bilateral calf burning pain 7/10     Resting HR 93 bpm     Resting BP 106/66     Resting Oxygen Saturation  96 %     Exercise Oxygen Saturation  during 6 min walk 98 %     Max Ex. HR 105 bpm     Max Ex. BP 150/78     2 Minute Post BP 130/70        Oxygen Initial Assessment:   Oxygen Re-Evaluation:   Oxygen Discharge (Final Oxygen Re-Evaluation):   Initial Exercise Prescription:  Initial Exercise Prescription - 11/29/23 1500       Date of Initial Exercise RX and Referring Provider   Date 11/29/23    Referring Provider Fernand Alter, MD      Oxygen   Maintain Oxygen Saturation 88% or higher      Recumbant Bike   Level 2    RPM 50    Watts 15    Minutes 15    METs 3.39      NuStep   Level 2    SPM 80    Minutes 15    METs 3.39      REL-XR   Level 1    Speed 50    Minutes 15    METs 3.39      T5 Nustep   Level 2    SPM 80    Minutes 15    METs 3.39      Biostep-RELP   Level 2    SPM 50    Minutes 15    METs 3.39      Track   Laps 21    Minutes 15    METs 2.14      Prescription Details   Frequency (times per week) 3    Duration Progress to 30 minutes of continuous aerobic without signs/symptoms of physical distress      Intensity   THRR 40-80% of Max Heartrate 120-149    Ratings of Perceived Exertion 11-13    Perceived Dyspnea 0-4      Progression   Progression Continue to progress workloads to maintain intensity without  signs/symptoms of physical distress.      Resistance Training   Training Prescription Yes    Weight 5 lb    Reps 10-15          Perform Capillary Blood Glucose checks as needed.  Exercise Prescription Changes:   Exercise Prescription Changes  Row Name 11/29/23 1500             Response to Exercise   Blood Pressure (Admit) 106/66       Blood Pressure (Exercise) 150/78       Blood Pressure (Exit) 130/70       Heart Rate (Admit) 93 bpm       Heart Rate (Exercise) 105 bpm       Heart Rate (Exit) 91 bpm       Oxygen Saturation (Admit) 96 %       Oxygen Saturation (Exercise) 98 %       Oxygen Saturation (Exit) 97 %       Rating of Perceived Exertion (Exercise) 15       Perceived Dyspnea (Exercise) 2       Symptoms R hip pain 4/10, bilateral calf burning pain 7/10       Comments results         Progression   Average METs 3.39          Exercise Comments:   Exercise Goals and Review:   Exercise Goals     Row Name 11/29/23 1552             Exercise Goals   Increase Physical Activity Yes       Intervention Provide advice, education, support and counseling about physical activity/exercise needs.;Develop an individualized exercise prescription for aerobic and resistive training based on initial evaluation findings, risk stratification, comorbidities and participant's personal goals.       Expected Outcomes Short Term: Attend rehab on a regular basis to increase amount of physical activity.;Long Term: Add in home exercise to make exercise part of routine and to increase amount of physical activity.;Long Term: Exercising regularly at least 3-5 days a week.       Increase Strength and Stamina Yes       Intervention Provide advice, education, support and counseling about physical activity/exercise needs.;Develop an individualized exercise prescription for aerobic and resistive training based on initial evaluation findings, risk stratification, comorbidities and  participant's personal goals.       Expected Outcomes Short Term: Perform resistance training exercises routinely during rehab and add in resistance training at home;Short Term: Increase workloads from initial exercise prescription for resistance, speed, and METs.;Long Term: Improve cardiorespiratory fitness, muscular endurance and strength as measured by increased METs and functional capacity ( )       Able to understand and use rate of perceived exertion (RPE) scale Yes       Intervention Provide education and explanation on how to use RPE scale       Expected Outcomes Short Term: Able to use RPE daily in rehab to express subjective intensity level;Long Term:  Able to use RPE to guide intensity level when exercising independently       Able to understand and use Dyspnea scale Yes       Intervention Provide education and explanation on how to use Dyspnea scale       Expected Outcomes Short Term: Able to use Dyspnea scale daily in rehab to express subjective sense of shortness of breath during exertion;Long Term: Able to use Dyspnea scale to guide intensity level when exercising independently       Knowledge and understanding of Target Heart Rate Range (THRR) Yes       Intervention Provide education and explanation of THRR including how the numbers were predicted and where they are located for reference  Expected Outcomes Short Term: Able to state/look up THRR;Short Term: Able to use daily as guideline for intensity in rehab;Long Term: Able to use THRR to govern intensity when exercising independently       Able to check pulse independently Yes       Intervention Provide education and demonstration on how to check pulse in carotid and radial arteries.;Review the importance of being able to check your own pulse for safety during independent exercise       Expected Outcomes Short Term: Able to explain why pulse checking is important during independent exercise;Long Term: Able to check pulse  independently and accurately       Understanding of Exercise Prescription Yes       Intervention Provide education, explanation, and written materials on patient's individual exercise prescription       Expected Outcomes Short Term: Able to explain program exercise prescription;Long Term: Able to explain home exercise prescription to exercise independently          Exercise Goals Re-Evaluation :   Discharge Exercise Prescription (Final Exercise Prescription Changes):  Exercise Prescription Changes - 11/29/23 1500       Response to Exercise   Blood Pressure (Admit) 106/66    Blood Pressure (Exercise) 150/78    Blood Pressure (Exit) 130/70    Heart Rate (Admit) 93 bpm    Heart Rate (Exercise) 105 bpm    Heart Rate (Exit) 91 bpm    Oxygen Saturation (Admit) 96 %    Oxygen Saturation (Exercise) 98 %    Oxygen Saturation (Exit) 97 %    Rating of Perceived Exertion (Exercise) 15    Perceived Dyspnea (Exercise) 2    Symptoms R hip pain 4/10, bilateral calf burning pain 7/10    Comments results      Progression   Average METs 3.39          Nutrition:  Target Goals: Understanding of nutrition guidelines, daily intake of sodium 1500mg , cholesterol 200mg , calories 30% from fat and 7% or less from saturated fats, daily to have 5 or more servings of fruits and vegetables.  Education: Nutrition 1 -Group instruction provided by verbal, written material, interactive activities, discussions, models, and posters to present general guidelines for heart healthy nutrition including macronutrients, label reading, and promoting whole foods over processed counterparts. Education serves as pensions consultant of discussion of heart healthy eating for all. Written material provided at class time.    Education: Nutrition 2 -Group instruction provided by verbal, written material, interactive activities, discussions, models, and posters to present general guidelines for heart healthy nutrition  including sodium, cholesterol, and saturated fat. Providing guidance of habit forming to improve blood pressure, cholesterol, and body weight. Written material provided at class time.     Biometrics:  Pre Biometrics - 11/29/23 1552       Pre Biometrics   Height 5' 9.6 (1.768 m)    Weight 177 lb 9.6 oz (80.6 kg)    Waist Circumference 44.1 inches    Hip Circumference 38 inches    Waist to Hip Ratio 1.16 %    BMI (Calculated) 25.77    Single Leg Stand 30 seconds           Nutrition Therapy Plan and Nutrition Goals:  Nutrition Therapy & Goals - 11/29/23 1553       Personal Nutrition Goals   Nutrition Goal Will meet w/ RD on 12/1      Intervention Plan   Intervention Prescribe, educate and counsel regarding  individualized specific dietary modifications aiming towards targeted core components such as weight, hypertension, lipid management, diabetes, heart failure and other comorbidities.    Expected Outcomes Short Term Goal: Understand basic principles of dietary content, such as calories, fat, sodium, cholesterol and nutrients.          Nutrition Assessments:  MEDIFICTS Score Key: >=70 Need to make dietary changes  40-70 Heart Healthy Diet <= 40 Therapeutic Level Cholesterol Diet   Picture Your Plate Scores: <59 Unhealthy dietary pattern with much room for improvement. 41-50 Dietary pattern unlikely to meet recommendations for good health and room for improvement. 51-60 More healthful dietary pattern, with some room for improvement.  >60 Healthy dietary pattern, although there may be some specific behaviors that could be improved.    Nutrition Goals Re-Evaluation:   Nutrition Goals Discharge (Final Nutrition Goals Re-Evaluation):   Psychosocial: Target Goals: Acknowledge presence or absence of significant depression and/or stress, maximize coping skills, provide positive support system. Participant is able to verbalize types and ability to use techniques and  skills needed for reducing stress and depression.   Education: Stress, Anxiety, and Depression - Group verbal and visual presentation to define topics covered.  Reviews how body is impacted by stress, anxiety, and depression.  Also discusses healthy ways to reduce stress and to treat/manage anxiety and depression. Written material provided at class time.   Education: Sleep Hygiene -Provides group verbal and written instruction about how sleep can affect your health.  Define sleep hygiene, discuss sleep cycles and impact of sleep habits. Review good sleep hygiene tips.   Initial Review & Psychosocial Screening:  Initial Psych Review & Screening - 11/27/23 1432       Initial Review   Current issues with Current Anxiety/Panic      Family Dynamics   Good Support System? Yes    Comments He takes some medication to help his anxiety. He can look to his wife, son , daughter and grandson for support.      Barriers   Psychosocial barriers to participate in program The patient should benefit from training in stress management and relaxation.;There are no identifiable barriers or psychosocial needs.      Screening Interventions   Interventions Encouraged to exercise;Provide feedback about the scores to participant;To provide support and resources with identified psychosocial needs    Expected Outcomes Short Term goal: Utilizing psychosocial counselor, staff and physician to assist with identification of specific Stressors or current issues interfering with healing process. Setting desired goal for each stressor or current issue identified.;Long Term Goal: Stressors or current issues are controlled or eliminated.;Short Term goal: Identification and review with participant of any Quality of Life or Depression concerns found by scoring the questionnaire.;Long Term goal: The participant improves quality of Life and PHQ9 Scores as seen by post scores and/or verbalization of changes          Quality of  Life Scores:   Scores of 19 and below usually indicate a poorer quality of life in these areas.  A difference of  2-3 points is a clinically meaningful difference.  A difference of 2-3 points in the total score of the Quality of Life Index has been associated with significant improvement in overall quality of life, self-image, physical symptoms, and general health in studies assessing change in quality of life.  PHQ-9: Review Flowsheet  More data exists      11/29/2023 01/13/2023 10/27/2022 11/23/2021 11/03/2020  Depression screen PHQ 2/9  Decreased Interest 0 0 3 3 1  Down, Depressed, Hopeless 0 0 0 3 1  PHQ - 2 Score 0 0 3 6 2   Altered sleeping 3 2 - 2 0  Tired, decreased energy 3 2 - 2 0  Change in appetite 3 0 - 2 0  Feeling bad or failure about yourself  0 0 - 3 1  Trouble concentrating 0 0 - 3 0  Moving slowly or fidgety/restless 0 0 - 2 0  Suicidal thoughts 0 0 - 0 0  PHQ-9 Score 9 4  - 20  3   Difficult doing work/chores Somewhat difficult Not difficult at all - - Not difficult at all    Details       Data saved with a previous flowsheet row definition        Interpretation of Total Score  Total Score Depression Severity:  1-4 = Minimal depression, 5-9 = Mild depression, 10-14 = Moderate depression, 15-19 = Moderately severe depression, 20-27 = Severe depression   Psychosocial Evaluation and Intervention:  Psychosocial Evaluation - 11/27/23 1433       Psychosocial Evaluation & Interventions   Interventions Encouraged to exercise with the program and follow exercise prescription;Relaxation education;Stress management education    Comments He takes some medication to help his anxiety. He can look to his wife, son , daughter and grandson for support.    Expected Outcomes Short: Start HeartTrack to help with mood. Long: Maintain a healthy mental state    Continue Psychosocial Services  Follow up required by staff          Psychosocial  Re-Evaluation:   Psychosocial Discharge (Final Psychosocial Re-Evaluation):   Vocational Rehabilitation: Provide vocational rehab assistance to qualifying candidates.   Vocational Rehab Evaluation & Intervention:   Education: Education Goals: Education classes will be provided on a variety of topics geared toward better understanding of heart health and risk factor modification. Participant will state understanding/return demonstration of topics presented as noted by education test scores.  Learning Barriers/Preferences:  Learning Barriers/Preferences - 11/27/23 1432       Learning Barriers/Preferences   Learning Barriers None    Learning Preferences None          General Cardiac Education Topics:  AED/CPR: - Group verbal and written instruction with the use of models to demonstrate the basic use of the AED with the basic ABC's of resuscitation.   Test and Procedures: - Group verbal and visual presentation and models provide information about basic cardiac anatomy and function. Reviews the testing methods done to diagnose heart disease and the outcomes of the test results. Describes the treatment choices: Medical Management, Angioplasty, or Coronary Bypass Surgery for treating various heart conditions including Myocardial Infarction, Angina, Valve Disease, and Cardiac Arrhythmias. Written material provided at class time.   Medication Safety: - Group verbal and visual instruction to review commonly prescribed medications for heart and lung disease. Reviews the medication, class of the drug, and side effects. Includes the steps to properly store meds and maintain the prescription regimen. Written material provided at class time.   Intimacy: - Group verbal instruction through game format to discuss how heart and lung disease can affect sexual intimacy. Written material provided at class time.   Know Your Numbers and Heart Failure: - Group verbal and visual instruction to  discuss disease risk factors for cardiac and pulmonary disease and treatment options.  Reviews associated critical values for Overweight/Obesity, Hypertension, Cholesterol, and Diabetes.  Discusses basics of heart failure: signs/symptoms and treatments.  Introduces Heart Failure Zone  chart for action plan for heart failure. Written material provided at class time.   Infection Prevention: - Provides verbal and written material to individual with discussion of infection control including proper hand washing and proper equipment cleaning during exercise session. Flowsheet Row Cardiac Rehab from 11/29/2023 in [facility] Cardiac and Pulmonary Rehab  Date 11/29/23  Educator MB  Instruction Review Code 1- Verbalizes Understanding    Falls Prevention: - Provides verbal and written material to individual with discussion of falls prevention and safety. Flowsheet Row Cardiac Rehab from 11/29/2023 in [facility] Cardiac and Pulmonary Rehab  Date 11/29/23  Educator MB  Instruction Review Code 1- Verbalizes Understanding    Other: -Provides group and verbal instruction on various topics (see comments)   Knowledge Questionnaire Score:   Core Components/Risk Factors/Patient Goals at Admission:  Personal Goals and Risk Factors at Admission - 11/29/23 1553       Core Components/Risk Factors/Patient Goals on Admission    Weight Management Weight Maintenance;Yes    Intervention Weight Management: Develop a combined nutrition and exercise program designed to reach desired caloric intake, while maintaining appropriate intake of nutrient and fiber, sodium and fats, and appropriate energy expenditure required for the weight goal.;Weight Management: Provide education and appropriate resources to help participant work on and attain dietary goals.;Weight Management/Obesity: Establish reasonable short term and long term weight goals.    Admit Weight 177 lb 9.6 oz (80.6 kg)    Goal Weight: Short Term 177 lb 9.6 oz (80.6 kg)     Goal Weight: Long Term 177 lb 9.6 oz (80.6 kg)    Expected Outcomes Short Term: Continue to assess and modify interventions until short term weight is achieved;Understanding recommendations for meals to include 15-35% energy as protein, 25-35% energy from fat, 35-60% energy from carbohydrates, less than 200mg  of dietary cholesterol, 20-35 gm of total fiber daily;Understanding of distribution of calorie intake throughout the day with the consumption of 4-5 meals/snacks;Weight Maintenance: Understanding of the daily nutrition guidelines, which includes 25-35% calories from fat, 7% or less cal from saturated fats, less than 200mg  cholesterol, less than 1.5gm of sodium, & 5 or more servings of fruits and vegetables daily;Long Term: Adherence to nutrition and physical activity/exercise program aimed toward attainment of established weight goal    Tobacco Cessation Yes    Number of packs per day 1.5    Intervention Assist the participant in steps to quit. Provide individualized education and counseling about committing to Tobacco Cessation, relapse prevention, and pharmacological support that can be provided by physician.;Education officer, environmental, assist with locating and accessing local/national Quit Smoking programs, and support quit date choice.    Expected Outcomes Short Term: Will demonstrate readiness to quit, by selecting a quit date.;Long Term: Complete abstinence from all tobacco products for at least 12 months from quit date.;Short Term: Will quit all tobacco product use, adhering to prevention of relapse plan.    Diabetes Yes    Intervention Provide education about signs/symptoms and action to take for hypo/hyperglycemia.;Provide education about proper nutrition, including hydration, and aerobic/resistive exercise prescription along with prescribed medications to achieve blood glucose in normal ranges: Fasting glucose 65-99 mg/dL    Expected Outcomes Short Term: Participant verbalizes  understanding of the signs/symptoms and immediate care of hyper/hypoglycemia, proper foot care and importance of medication, aerobic/resistive exercise and nutrition plan for blood glucose control.;Long Term: Attainment of HbA1C < 7%.    Hypertension Yes    Intervention Provide education on lifestyle modifcations including regular physical activity/exercise, weight management, moderate  sodium restriction and increased consumption of fresh fruit, vegetables, and low fat dairy, alcohol moderation, and smoking cessation.;Monitor prescription use compliance.    Expected Outcomes Short Term: Continued assessment and intervention until BP is < 140/4mm HG in hypertensive participants. < 130/65mm HG in hypertensive participants with diabetes, heart failure or chronic kidney disease.;Long Term: Maintenance of blood pressure at goal levels.    Lipids Yes    Intervention Provide education and support for participant on nutrition & aerobic/resistive exercise along with prescribed medications to achieve LDL 70mg , HDL >40mg .    Expected Outcomes Short Term: Participant states understanding of desired cholesterol values and is compliant with medications prescribed. Participant is following exercise prescription and nutrition guidelines.;Long Term: Cholesterol controlled with medications as prescribed, with individualized exercise RX and with personalized nutrition plan. Value goals: LDL < 70mg , HDL > 40 mg.          Education:Diabetes - Individual verbal and written instruction to review signs/symptoms of diabetes, desired ranges of glucose level fasting, after meals and with exercise. Acknowledge that pre and post exercise glucose checks will be done for 3 sessions at entry of program. Flowsheet Row Cardiac Rehab from 11/29/2023 in [facility]  Date 11/29/23  Educator MB  Instruction Review Code 1- Verbalizes Understanding    Core Components/Risk Factors/Patient Goals Review:    Core  Components/Risk Factors/Patient Goals at Discharge (Final Review):    ITP Comments:  ITP Comments     Row Name 11/27/23 1434 11/29/23 1543 12/27/23 1112 01/09/24 0920     ITP Comments Virtual Visit completed. Patient informed on EP and RD appointment and 6 Minute walk test. Patient also informed of patient health questionnaires on My Chart. Patient Verbalizes understanding. Visit diagnosis can be found in [facility] 11/08/2023. Completed and gym orientation for cardiac rehab. Initial ITP created and sent for review to Dr. Oneil Pinal, Medical Director. Brenan is a current tobacco user. Intervention for tobacco cessation was provided at the initial medical review. He was asked about readiness to quit and reported that he is slowly decreasing his amount of cigarettes and is currently down to 12-14 a day with the help of wellbutrin . He does not have a set quit date, but would like to by the new year. Patient was advised and educated about tobacco cessation using combination therapy, tobacco cessation classes, quit line, and quit smoking apps. Patient demonstrated understanding of this material. Staff will continue to provide encouragement and follow up with the patient throughout the program. 30 Day review completed. Medical Director ITP review done, changes made as directed, and signed approval by Medical Director. Early Discharge due to not attending program       Comments: early discharge    [1]  Current Outpatient Medications:    acetaminophen  (TYLENOL ) 325 MG tablet, Take 2 tablets (650 mg total) by mouth every 4 (four) hours as needed for mild pain (or temp > 37.5 C (99.5 F))., Disp: , Rfl:    albuterol  (PROAIR  HFA) 108 (90 Base) MCG/ACT inhaler, Inhale 2 puffs into the lungs every 6 (six) hours as needed for wheezing or shortness of breath., Disp: 18 g, Rfl: 2   aspirin  EC (SM ASPIRIN  ADULT LOW STRENGTH) 81 MG tablet, Take 1 tablet (81 mg total) by mouth daily., Disp: 90 tablet, Rfl: 3    atorvastatin  (LIPITOR ) 80 MG tablet, Take 1 tablet (80 mg total) by mouth daily., Disp: 90 tablet, Rfl: 4   baclofen  (LIORESAL ) 10 MG tablet, Take 1  tablet (10 mg total) by mouth daily., Disp: 30 tablet, Rfl: 1   blood glucose meter kit and supplies KIT, Use as directed, Disp: 1 each, Rfl: 0   budesonide -formoterol  (SYMBICORT ) 160-4.5 MCG/ACT inhaler, Inhale 2 puffs into the lungs in the morning and at bedtime., Disp: 10.2 g, Rfl: 12   buPROPion  (WELLBUTRIN  XL) 300 MG 24 hr tablet, Take 1 tablet (300 mg total) by mouth every morning., Disp: 30 tablet, Rfl: 2   chlorthalidone  (HYGROTON ) 25 MG tablet, Take 1 tablet (25 mg total) by mouth once daily., Disp: 90 tablet, Rfl: 2   clopidogrel  (PLAVIX ) 75 MG tablet, Take 1 tablet (75 mg total) by mouth once daily., Disp: 90 tablet, Rfl: 4   Continuous Glucose Sensor (DEXCOM G7 SENSOR) MISC, Use as directed. Change every 10 days ., Disp: 5 each, Rfl: 2   gabapentin  (NEURONTIN ) 100 MG capsule, Take 1 capsule (100 mg total) by mouth at bedtime., Disp: 90 capsule, Rfl: 2   glucose blood (ACCU-CHEK GUIDE TEST) test strip, Use to check blood glucose up to 4 times a day, Disp: 100 each, Rfl: 3   Insulin  Glargine (BASAGLAR  KWIKPEN) 100 UNIT/ML, Inject 45 Units into the skin daily. (Patient taking differently: Inject 50 Units into the skin daily.), Disp: 30 mL, Rfl: 6   insulin  lispro (HUMALOG  KWIKPEN) 200 UNIT/ML KwikPen, No insulin  if blood sugar less than 150, Inject 3 units if 151-200, inject 6 units if 201-250, inject 9 units if 251-300, inject 12 units if 301-350. Sliding scale with 12 units/day max, Disp: 3 mL, Rfl: 0   Insulin  Pen Needle (TRUEPLUS 5-BEVEL PEN NEEDLES) 32G X 4 MM MISC, Inject 1 Needle with Basaglar  as directed daily., Disp: 100 each, Rfl: 2   losartan  (COZAAR ) 25 MG tablet, Take 1 tablet (25 mg total) by mouth daily., Disp: 90 tablet, Rfl: 1   metFORMIN  (GLUCOPHAGE -XR) 500 MG 24 hr tablet, Take 2 tablets (1,000 mg total) by mouth daily with  breakfast., Disp: 180 tablet, Rfl: 1   metoprolol  succinate (TOPROL -XL) 25 MG 24 hr tablet, Take 0.5 tablets (12.5 mg total) by mouth daily., Disp: 90 tablet, Rfl: 3   nicotine  (NICODERM CQ  - DOSED IN MG/24 HOURS) 21 mg/24hr patch, Place 1 patch (21 mg total) onto the skin daily., Disp: 28 patch, Rfl: 0   nitroGLYCERIN  (NITROSTAT ) 0.4 MG SL tablet, Place 1 tablet (0.4 mg total) under the tongue every 5 (five) minutes as needed for chest pain., Disp: 25 tablet, Rfl: 12   ondansetron  (ZOFRAN ) 4 MG tablet, Take 1 tablet (4 mg total) by mouth every 8 (eight) hours as needed for nausea or vomiting., Disp: 20 tablet, Rfl: 0   pantoprazole  (PROTONIX ) 40 MG tablet, Take 1 tablet (40 mg total) by mouth daily., Disp: 90 tablet, Rfl: 3   potassium chloride  (KLOR-CON  M) 10 MEQ tablet, Take 2 tablets (20 mEq total) by mouth 3 (three) times daily for 3 days, THEN 1 tablet (10 mEq total) once daily, Disp: 100 tablet, Rfl: 3   Rightest GL300 Lancets MISC, Use up to four times daily as directed to check blood sugar., Disp: 100 each, Rfl: 11 [2]  Social History Tobacco Use  Smoking Status Every Day   Current packs/day: 1.50   Average packs/day: 1.5 packs/day for 30.0 years (45.0 ttl pk-yrs)   Types: Cigarettes  Smokeless Tobacco Former   Types: Chew  Tobacco Comments   Patient has cut down to ~1 ppd since having stroke. Gave [place] Quit info. Patient down to 1/2  ppd

## 2024-01-10 ENCOUNTER — Encounter

## 2024-01-11 ENCOUNTER — Encounter

## 2024-01-15 ENCOUNTER — Encounter

## 2024-01-17 ENCOUNTER — Encounter

## 2024-01-18 ENCOUNTER — Encounter

## 2024-01-22 ENCOUNTER — Other Ambulatory Visit: Payer: Self-pay

## 2024-01-22 ENCOUNTER — Encounter

## 2024-01-22 ENCOUNTER — Other Ambulatory Visit: Payer: Self-pay | Admitting: Internal Medicine

## 2024-01-22 DIAGNOSIS — E1142 Type 2 diabetes mellitus with diabetic polyneuropathy: Secondary | ICD-10-CM

## 2024-01-22 MED ORDER — HUMALOG KWIKPEN 200 UNIT/ML ~~LOC~~ SOPN
PEN_INJECTOR | SUBCUTANEOUS | 0 refills | Status: AC
  Filled 2024-01-22: qty 3, 25d supply, fill #0

## 2024-01-24 ENCOUNTER — Encounter

## 2024-01-25 ENCOUNTER — Encounter

## 2024-01-29 ENCOUNTER — Encounter

## 2024-01-31 ENCOUNTER — Encounter

## 2024-02-01 ENCOUNTER — Encounter

## 2024-02-05 ENCOUNTER — Encounter

## 2024-02-07 ENCOUNTER — Encounter

## 2024-02-08 ENCOUNTER — Encounter

## 2024-02-08 ENCOUNTER — Other Ambulatory Visit: Payer: Self-pay

## 2024-02-08 MED FILL — Insulin Glargine Soln Pen-Injector 100 Unit/ML: SUBCUTANEOUS | 66 days supply | Qty: 30 | Fill #3 | Status: AC

## 2024-02-09 ENCOUNTER — Other Ambulatory Visit: Payer: Self-pay

## 2024-02-10 ENCOUNTER — Other Ambulatory Visit: Payer: Self-pay

## 2024-02-12 ENCOUNTER — Other Ambulatory Visit: Payer: Self-pay

## 2024-02-12 ENCOUNTER — Encounter

## 2024-02-13 ENCOUNTER — Ambulatory Visit: Admitting: Internal Medicine

## 2024-02-13 ENCOUNTER — Encounter: Payer: Self-pay | Admitting: Internal Medicine

## 2024-02-13 VITALS — BP 156/80 | HR 93 | Ht 69.0 in | Wt 188.4 lb

## 2024-02-13 DIAGNOSIS — Z1211 Encounter for screening for malignant neoplasm of colon: Secondary | ICD-10-CM | POA: Diagnosis not present

## 2024-02-13 DIAGNOSIS — E782 Mixed hyperlipidemia: Secondary | ICD-10-CM | POA: Diagnosis not present

## 2024-02-13 DIAGNOSIS — E1142 Type 2 diabetes mellitus with diabetic polyneuropathy: Secondary | ICD-10-CM | POA: Diagnosis not present

## 2024-02-13 DIAGNOSIS — I152 Hypertension secondary to endocrine disorders: Secondary | ICD-10-CM

## 2024-02-13 DIAGNOSIS — J449 Chronic obstructive pulmonary disease, unspecified: Secondary | ICD-10-CM

## 2024-02-13 DIAGNOSIS — E876 Hypokalemia: Secondary | ICD-10-CM | POA: Diagnosis not present

## 2024-02-13 DIAGNOSIS — E1165 Type 2 diabetes mellitus with hyperglycemia: Secondary | ICD-10-CM

## 2024-02-13 DIAGNOSIS — Z6827 Body mass index (BMI) 27.0-27.9, adult: Secondary | ICD-10-CM | POA: Diagnosis not present

## 2024-02-13 DIAGNOSIS — E663 Overweight: Secondary | ICD-10-CM | POA: Insufficient documentation

## 2024-02-13 DIAGNOSIS — E1159 Type 2 diabetes mellitus with other circulatory complications: Secondary | ICD-10-CM

## 2024-02-13 DIAGNOSIS — E1169 Type 2 diabetes mellitus with other specified complication: Secondary | ICD-10-CM | POA: Diagnosis not present

## 2024-02-13 DIAGNOSIS — R110 Nausea: Secondary | ICD-10-CM | POA: Diagnosis not present

## 2024-02-13 LAB — POC CREATINE & ALBUMIN,URINE
Albumin/Creatinine Ratio, Urine, POC: <30
Creatinine, POC: 200 mg/dL
Microalbumin Ur, POC: 30 mg/L

## 2024-02-13 LAB — POCT CBG (FASTING - GLUCOSE)-MANUAL ENTRY: Glucose Fasting, POC: 227 mg/dL — AB (ref 70–99)

## 2024-02-14 ENCOUNTER — Encounter

## 2024-02-15 ENCOUNTER — Encounter

## 2024-02-16 ENCOUNTER — Other Ambulatory Visit: Payer: Self-pay

## 2024-02-19 ENCOUNTER — Encounter

## 2024-02-20 ENCOUNTER — Encounter (INDEPENDENT_AMBULATORY_CARE_PROVIDER_SITE_OTHER): Payer: Self-pay | Admitting: Vascular Surgery

## 2024-02-20 ENCOUNTER — Encounter (INDEPENDENT_AMBULATORY_CARE_PROVIDER_SITE_OTHER)

## 2024-02-21 ENCOUNTER — Encounter

## 2024-02-22 ENCOUNTER — Encounter

## 2024-02-26 ENCOUNTER — Encounter (INDEPENDENT_AMBULATORY_CARE_PROVIDER_SITE_OTHER): Payer: Self-pay | Admitting: Nurse Practitioner

## 2024-02-26 ENCOUNTER — Encounter

## 2024-02-27 ENCOUNTER — Ambulatory Visit: Admitting: Internal Medicine

## 2024-02-28 ENCOUNTER — Encounter

## 2024-02-29 ENCOUNTER — Encounter
